# Patient Record
Sex: Female | Born: 1935 | Race: White | Hispanic: No | Smoking: Former smoker
Health system: Southern US, Community
[De-identification: ages and names within clinical notes are randomized; demographics above are authoritative.]

## PROBLEM LIST (undated history)

## (undated) DIAGNOSIS — J449 Chronic obstructive pulmonary disease, unspecified: Secondary | ICD-10-CM

## (undated) DIAGNOSIS — F419 Anxiety disorder, unspecified: Secondary | ICD-10-CM

## (undated) DIAGNOSIS — E119 Type 2 diabetes mellitus without complications: Secondary | ICD-10-CM

## (undated) DIAGNOSIS — E785 Hyperlipidemia, unspecified: Secondary | ICD-10-CM

## (undated) DIAGNOSIS — I10 Essential (primary) hypertension: Secondary | ICD-10-CM

## (undated) DIAGNOSIS — E039 Hypothyroidism, unspecified: Secondary | ICD-10-CM

## (undated) DIAGNOSIS — I890 Lymphedema, not elsewhere classified: Secondary | ICD-10-CM

## (undated) DIAGNOSIS — G4730 Sleep apnea, unspecified: Secondary | ICD-10-CM

## (undated) DIAGNOSIS — K219 Gastro-esophageal reflux disease without esophagitis: Secondary | ICD-10-CM

## (undated) DIAGNOSIS — M545 Low back pain, unspecified: Secondary | ICD-10-CM

## (undated) DIAGNOSIS — M1990 Unspecified osteoarthritis, unspecified site: Secondary | ICD-10-CM

## (undated) DIAGNOSIS — D649 Anemia, unspecified: Secondary | ICD-10-CM

## (undated) DIAGNOSIS — K449 Diaphragmatic hernia without obstruction or gangrene: Secondary | ICD-10-CM

## (undated) HISTORY — DX: Low back pain, unspecified: M54.50

## (undated) HISTORY — DX: Unspecified osteoarthritis, unspecified site: M19.90

## (undated) HISTORY — DX: Diaphragmatic hernia without obstruction or gangrene: K44.9

## (undated) HISTORY — DX: Sleep apnea, unspecified: G47.30

## (undated) HISTORY — DX: Type 2 diabetes mellitus without complications: E11.9

## (undated) HISTORY — DX: Hypothyroidism, unspecified: E03.9

## (undated) HISTORY — DX: Anemia, unspecified: D64.9

## (undated) HISTORY — DX: Chronic obstructive pulmonary disease, unspecified: J44.9

## (undated) HISTORY — DX: Anxiety disorder, unspecified: F41.9

## (undated) HISTORY — DX: Hyperlipidemia, unspecified: E78.5

## (undated) HISTORY — DX: Lymphedema, not elsewhere classified: I89.0

## (undated) HISTORY — PX: CLOSED REDUCTION SHOULDER DISLOCATION: SUR242

## (undated) HISTORY — DX: Essential (primary) hypertension: I10

## (undated) HISTORY — DX: Gastro-esophageal reflux disease without esophagitis: K21.9

---

## 2012-11-16 DIAGNOSIS — R32 Unspecified urinary incontinence: Secondary | ICD-10-CM

## 2012-11-16 DIAGNOSIS — H269 Unspecified cataract: Secondary | ICD-10-CM

## 2012-11-16 HISTORY — DX: Unspecified cataract: H26.9

## 2012-11-16 HISTORY — DX: Unspecified urinary incontinence: R32

## 2014-04-26 IMAGING — CT CT ANGIO CHEST
2 of 6 series · 18 of 36 positions shown · IV contrast (APPLIED)
Comparison: Chest radiograph [DATE], 8120 2385 hr

CLINICAL DATA: Tachycardia, COPD, elevated D-dimer. Assess for
pulmonary embolism.

EXAM:
CT ANGIOGRAPHY CHEST WITH CONTRAST
TECHNIQUE: Multidetector CT imaging of the chest was performed using the
standard protocol during bolus administration of intravenous
contrast. Multiplanar CT image reconstructions including MIPs were
obtained to evaluate the vascular anatomy.
CONTRAST:  75 cc of Isovue 370

[Series 5: pe 1.0 thins · axial · 0.68mm/px · z∈[-266,-40]mm · 17 of 255 slices shown]
[im 15/255  lung]
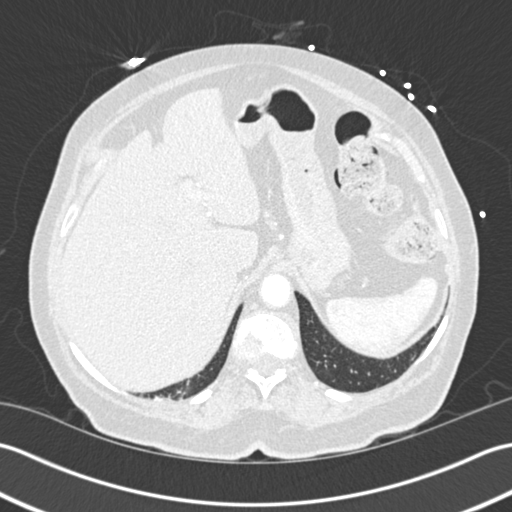
[im 29/255  mediastinal]
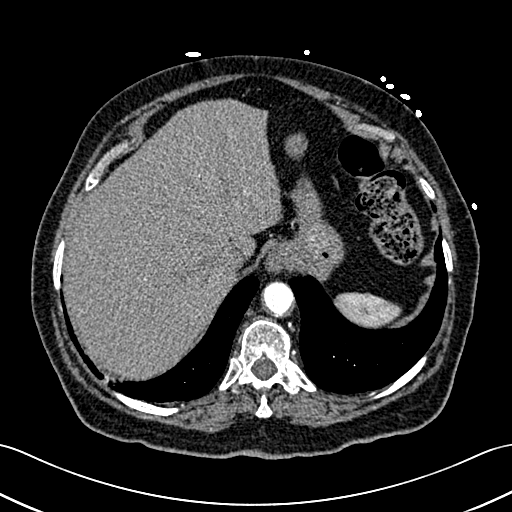
[im 43/255  lung]
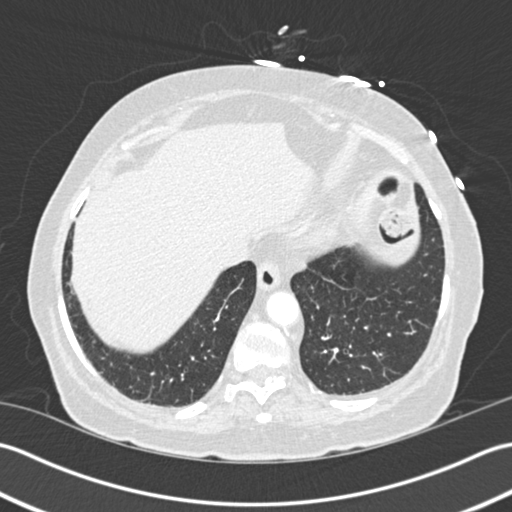
[im 57/255  mediastinal]
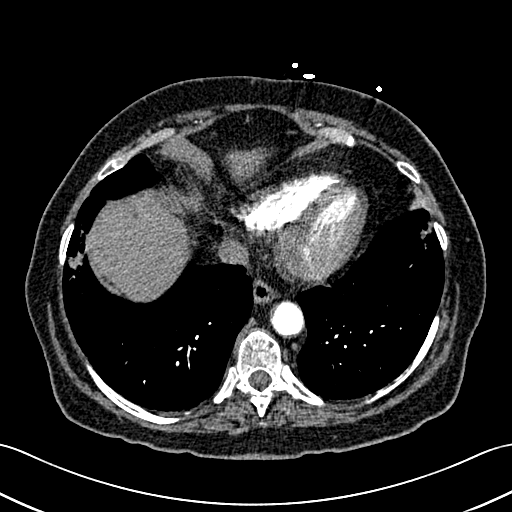
[im 71/255  lung]
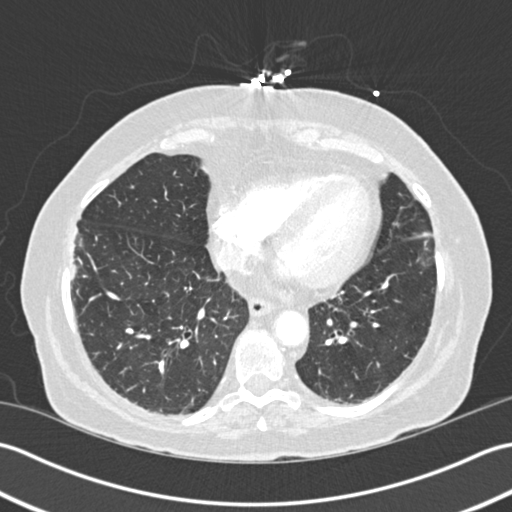
[im 85/255  mediastinal]
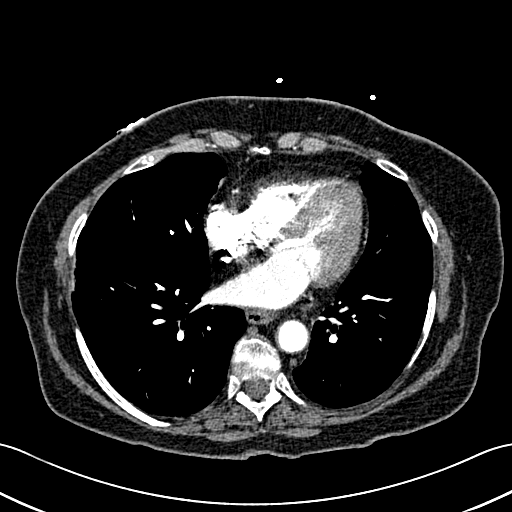
[im 99/255  lung]
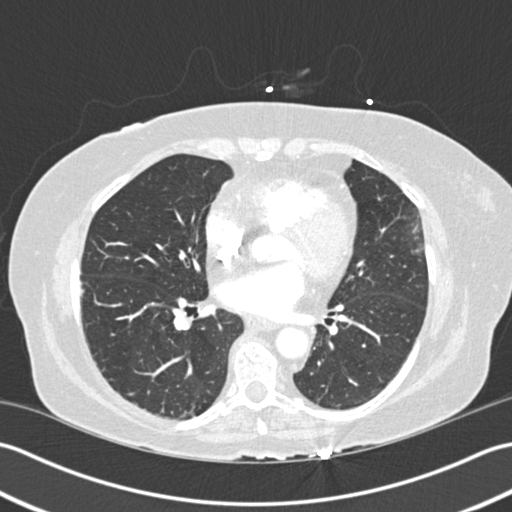
[im 113/255  mediastinal]
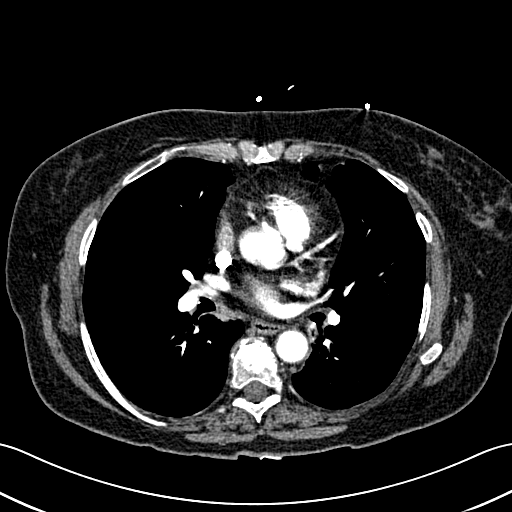
[im 128/255  lung]
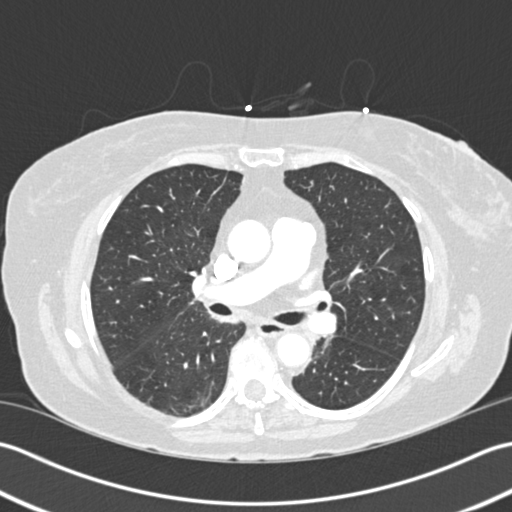
[im 142/255  mediastinal]
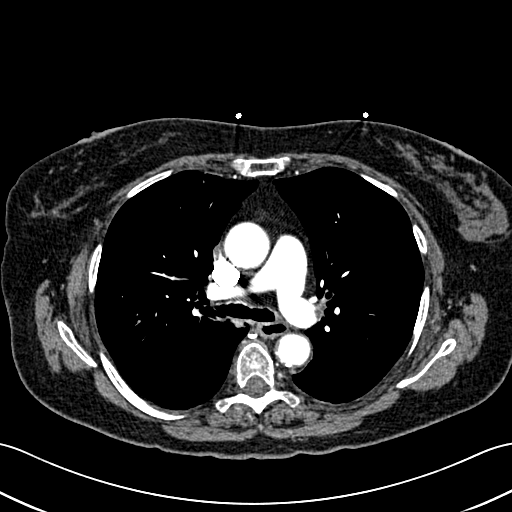
[im 156/255  lung]
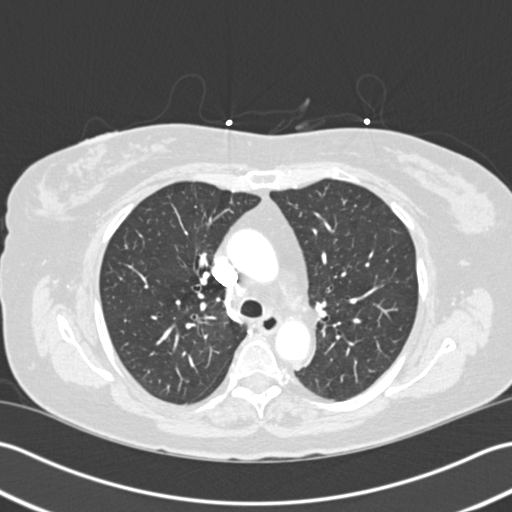
[im 170/255  mediastinal]
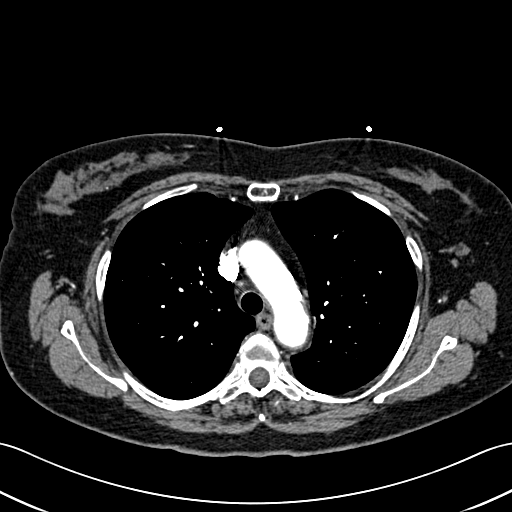
[im 184/255  lung]
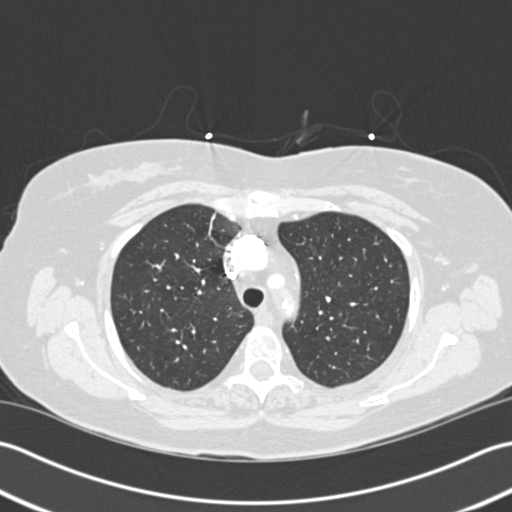
[im 198/255  mediastinal]
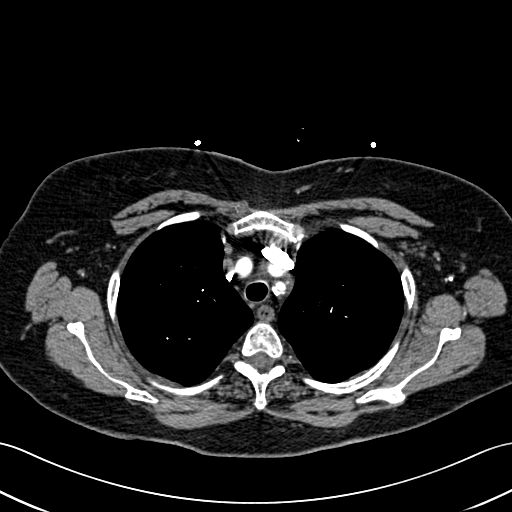
[im 212/255  lung]
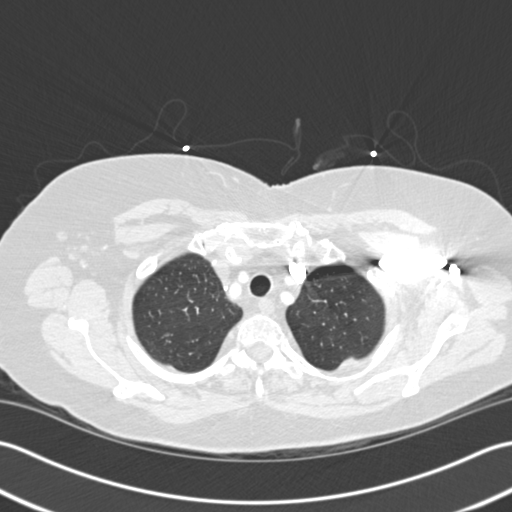
[im 226/255  mediastinal]
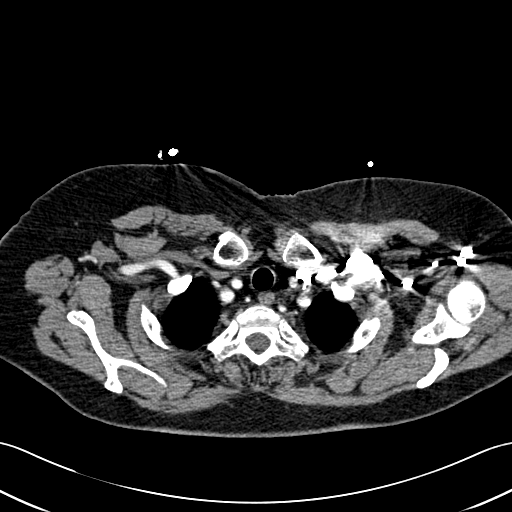
[im 240/255  lung]
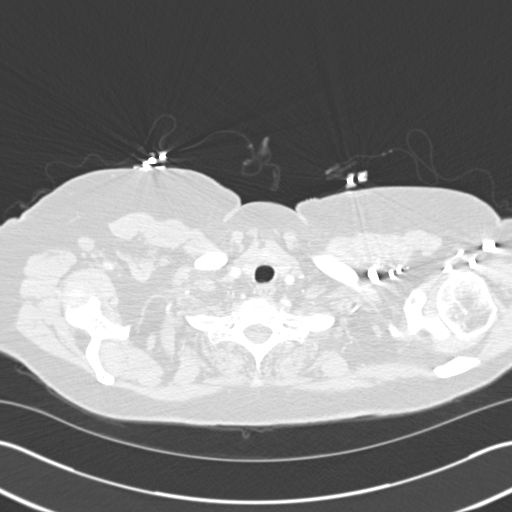

[Series 7: cor pe 2.0 mpr · coronal · 0.49mm/px · 1 of 128 slices shown]
[im 64/128  mediastinal]
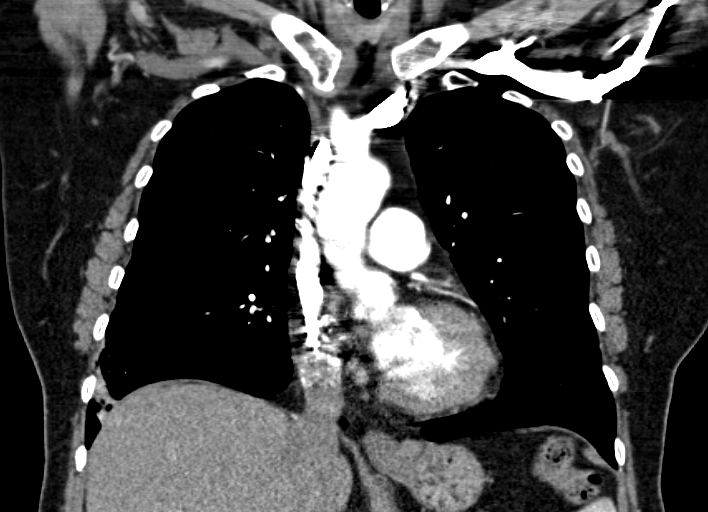

[18 of 36 positions shown; findings below may reference images not displayed]

FINDINGS: Technically adequate examination. Main pulmonary artery is not
enlarged. No pulmonary arterial filling defects to the level of the
subsegmental branches.

Heart size is normal. Pericardium is unremarkable. Mild calcific
atherosclerosis of the aortic arch, extending into the origin of the
left subclavian artery, the aorta is otherwise unremarkable.

Strandy densities in the lateral segments of the lower lobes
bilaterally likely reflect atelectasis. Patchy ground-glass
opacities in the lingula. Superimposed minimal consolidation the
lateral segment of the right lower lobe. No pleural effusions or
focal consolidations. Tracheobronchial tree is patent and midline.
Minimal atelectasis in the right middle lobe and lingula. No
pneumothorax.

Included view of the abdomen is unremarkable. Soft tissues are
nonsuspicious. Moderate degenerative change of the thoracic spine
without fracture nor malalignment.

Review of the MIP images confirms the above findings.
IMPRESSION: No pulmonary embolism.

Mild atelectasis, with minimal consolidation in the right middle
lobe, which is nonspecific though could reflect early pneumonia.
Patchy ground-glass is in the lingula are nonspecific.

  By: Ruben Victor Laos

## 2014-09-16 DIAGNOSIS — M353 Polymyalgia rheumatica: Secondary | ICD-10-CM

## 2014-09-16 HISTORY — DX: Polymyalgia rheumatica: M35.3

## 2014-11-16 DIAGNOSIS — I872 Venous insufficiency (chronic) (peripheral): Secondary | ICD-10-CM

## 2014-11-16 HISTORY — DX: Venous insufficiency (chronic) (peripheral): I87.2

## 2015-03-05 ENCOUNTER — Other Ambulatory Visit: Payer: Self-pay | Admitting: Podiatrist

## 2015-03-05 DIAGNOSIS — R601 Generalized edema: Secondary | ICD-10-CM

## 2015-03-14 ENCOUNTER — Ambulatory Visit
Admission: RE | Admit: 2015-03-14 | Discharge: 2015-03-14 | Disposition: A | Discharge status: Home / Self Care | Payer: Medicare PPO | Source: Ambulatory Visit | Attending: Podiatrist | Admitting: Podiatrist

## 2015-03-14 ENCOUNTER — Other Ambulatory Visit: Payer: Self-pay | Admitting: Podiatrist

## 2015-03-14 DIAGNOSIS — R601 Generalized edema: Secondary | ICD-10-CM

## 2015-03-14 DIAGNOSIS — I872 Venous insufficiency (chronic) (peripheral): Secondary | ICD-10-CM | POA: Insufficient documentation

## 2015-03-18 ENCOUNTER — Ambulatory Visit
Admission: RE | Admit: 2015-03-18 | Discharge: 2015-03-18 | Disposition: A | Discharge status: Home / Self Care | Payer: Medicare PPO | Source: Ambulatory Visit | Attending: Internal Medicine | Admitting: Internal Medicine

## 2015-03-18 ENCOUNTER — Other Ambulatory Visit: Payer: Self-pay | Admitting: Internal Medicine

## 2015-03-18 DIAGNOSIS — R079 Chest pain, unspecified: Secondary | ICD-10-CM | POA: Insufficient documentation

## 2015-03-18 DIAGNOSIS — R0602 Shortness of breath: Secondary | ICD-10-CM

## 2015-03-18 DIAGNOSIS — R5383 Other fatigue: Secondary | ICD-10-CM

## 2015-03-18 DIAGNOSIS — R918 Other nonspecific abnormal finding of lung field: Secondary | ICD-10-CM | POA: Insufficient documentation

## 2015-03-18 LAB — CBC AND DIFFERENTIAL
Basophils Absolute Automated: 0.09 10*3/uL (ref 0.00–0.20)
Basophils Automated: 1 %
Eosinophils Absolute Automated: 0.18 10*3/uL (ref 0.00–0.70)
Eosinophils Automated: 2 %
Hematocrit: 42.5 % (ref 37.0–47.0)
Hgb: 13.6 g/dL (ref 12.0–16.0)
Immature Granulocytes Absolute: 0.04 10*3/uL
Immature Granulocytes: 1 %
Lymphocytes Absolute Automated: 2.22 10*3/uL (ref 0.50–4.40)
Lymphocytes Automated: 27 %
MCH: 29.4 pg (ref 28.0–32.0)
MCHC: 32 g/dL (ref 32.0–36.0)
MCV: 91.8 fL (ref 80.0–100.0)
MPV: 9 fL — ABNORMAL LOW (ref 9.4–12.3)
Monocytes Absolute Automated: 0.58 10*3/uL (ref 0.00–1.20)
Monocytes: 7 %
Neutrophils Absolute: 5.26 10*3/uL (ref 1.80–8.10)
Neutrophils: 63 %
Platelets: 272 10*3/uL (ref 140–400)
RBC: 4.63 10*6/uL (ref 4.20–5.40)
RDW: 15 % (ref 12–15)
WBC: 8.33 10*3/uL (ref 3.50–10.80)

## 2015-03-18 LAB — COMPREHENSIVE METABOLIC PANEL
ALT: 12 U/L (ref 0–55)
AST (SGOT): 10 U/L (ref 5–34)
Albumin/Globulin Ratio: 1.6 (ref 0.9–2.2)
Albumin: 3.8 g/dL (ref 3.5–5.0)
Alkaline Phosphatase: 53 U/L (ref 37–106)
Anion Gap: 12 (ref 5.0–15.0)
BUN: 17.2 mg/dL (ref 7.0–19.0)
Bilirubin, Total: 0.4 mg/dL (ref 0.2–1.2)
CO2: 31 mEq/L — ABNORMAL HIGH (ref 22–29)
Calcium: 9.7 mg/dL (ref 7.9–10.2)
Chloride: 97 mEq/L — ABNORMAL LOW (ref 100–111)
Creatinine: 0.8 mg/dL (ref 0.6–1.0)
Globulin: 2.4 g/dL (ref 2.0–3.6)
Glucose: 105 mg/dL — ABNORMAL HIGH (ref 70–100)
Potassium: 3.8 mEq/L (ref 3.5–5.1)
Protein, Total: 6.2 g/dL (ref 6.0–8.3)
Sodium: 140 mEq/L (ref 136–145)

## 2015-03-18 LAB — TSH: TSH: 4.22 u[IU]/mL (ref 0.35–4.94)

## 2015-03-18 LAB — GFR: EGFR: >60

## 2015-03-18 LAB — CK: Creatine Kinase (CK): 19 U/L — ABNORMAL LOW (ref 29–168)

## 2015-03-18 LAB — B-TYPE NATRIURETIC PEPTIDE: B-Natriuretic Peptide: 70.8 pg/mL (ref 0.0–100.0)

## 2015-03-18 LAB — HEMOGLOBIN A1C: Hemoglobin A1C: 6.7 % — ABNORMAL HIGH (ref 0.0–6.0)

## 2015-03-18 MED ORDER — IODIXANOL 320 MG/ML IV SOLN
100.0000 mL | Freq: Once | INTRAVENOUS | Status: AC | PRN
Start: 2015-03-18 — End: 2015-03-18
  Administered 2015-03-18: 100 mL via INTRAVENOUS

## 2015-03-21 ENCOUNTER — Ambulatory Visit: Payer: Medicare PPO | Attending: Podiatrist | Admitting: Interventional Radiology and Diagnostic Radiology

## 2015-03-21 VITALS — BP 138/73 | HR 71 | Resp 16

## 2015-03-21 DIAGNOSIS — J449 Chronic obstructive pulmonary disease, unspecified: Secondary | ICD-10-CM | POA: Insufficient documentation

## 2015-03-21 DIAGNOSIS — M353 Polymyalgia rheumatica: Secondary | ICD-10-CM | POA: Insufficient documentation

## 2015-03-21 DIAGNOSIS — I10 Essential (primary) hypertension: Secondary | ICD-10-CM | POA: Insufficient documentation

## 2015-03-21 DIAGNOSIS — I872 Venous insufficiency (chronic) (peripheral): Secondary | ICD-10-CM | POA: Insufficient documentation

## 2015-03-24 NOTE — Progress Notes (Signed)
Interventional Radiology Consult   Date Time:  03/24/2015  7:17 PM                        Patient Name: Rachael [name],Rachael [name]  Referring Provider: Georgeanna Harrison, DPM                                   PMD: Jocelyn Lamer, DO      History of Present Illness:      Rachael [name]  is a 79 y.o. year old  female who has had right lower leg swelling since her 3rd pregnancy, approximately 40 years ago.  She also c/o neuropathy in left leg from childbirth injury.  She does c/o aching, throbbing and heaviness, particularly in her RLE, which are worse after standing for long periods and are relieved by rest and elevation.  She denies any previous episodes of superficial thrombophlebitis or skin ulceration. She has worn compression hose occasionally in the past, but states she did not wear them for long due to discomfort related to them.  She has never sought treatment for varicose veins previously.      Past Medical History:     Past Medical History   Diagnosis Date   . Hypertension    . Chronic obstructive pulmonary disease    . Sleep apnea    . Arthritis    . Polymyalgia rheumatica 09/2014   . 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 Incontinent of urine 2014     following back surgery       Past Surgical History:     Past Surgical History   Procedure Laterality Date   . Back surgery  2014   . Carpal tunnel release Bilateral 1978   . Hysterectomy  1985       Allergies:     Allergies   Allergen Reactions   . Tolectin [Tolmetin] Swelling   . Latex Itching                Contrast Allergy: none    Medications:     Current Outpatient Prescriptions   Medication Sig Dispense Refill   . Calcium Carbonate-Vit D-Min (CALCIUM 1200 PO) Take by mouth.     . diltiazem (CARDIZEM CD) 240 MG 24 hr capsule Take 240 mg by mouth daily.     . DULoxetine (CYMBALTA) 60 MG capsule Take 60 mg by mouth 2 (two) times daily.     Marland Kitchen gabapentin (NEURONTIN) 300 MG capsule Take 300 mg by mouth daily.     Marland Kitchen losartan-hydrochlorothiazide (HYZAAR)  100-25 MG per tablet Take 1 tablet by mouth daily.     . metoprolol (LOPRESSOR) 25 MG tablet Take 25 mg by mouth 2 (two) times daily.     . potassium chloride (MICRO-K) 10 MEQ CR capsule Take 10 mEq by mouth daily.     . predniSONE (DELTASONE) 5 MG tablet Take 7.5 mg by mouth daily.     . traMODol (ULTRAM-ER) 300 MG 24 hr tablet Take 300 mg by mouth daily.     . traZODone (DESYREL) 100 MG tablet Take 100 mg by mouth nightly.       No current facility-administered medications for this visit.       Review of Systems:   History obtained from patient.  A comprehensive review of systems was:   General: Negative for  fever or chills, recent wt loss  Eyes, ears, nose & throat: Negative for headaches, oral lesions, vertigo,  hearing loss or visual changes  Cardiovascular: Negative for chest pain, palpitations   Respiratory: Negative for cough, wheezing, shortness of breath, orthopnea, dyspnea on exertion,  or hemoptysis  Gastrointestinal: Negative for abdominal pain, appetite loss, blood in stools, change in bowel habits, diarrhea, nausea/vomiting or swallowing difficulty/pain   Genito-urinary: Negative for urinary frequency, urgency, dysuria or hematuria - does have incontinence following back surgery 2014  Neurological: Negative for seizure, weakness, paresthesia or functional changes  Musculoskeletal: negative for joint pain, joint swelling or muscular weakness - new diagnosis of polymyalgia rheumatica  Skin: Negative for rash, ulcers or  bruising    Physical Exam:     Filed Vitals:    03/21/15 1145   BP: 138/73   Pulse: 71   Resp: 16   SpO2: 94%        Constitutional: female who appears her stated age, in no apparent distress, well developed, well nourished.   Left lower extremity:  No obvious edema, wound, hyperpigmentation, The left leg demonstrates no bulging varicosities, scattered spider veins.  Otherwise normal coloration and turgor, intact with out ulcers/lesions/rash.   Left ankle circumference:  26cm    Left  calf circumference:   40cm   Left thigh circumference:  48cm  Left DP: 2+   Left PT: 2+     Right lower extremity: 1+ edema present at ankle to lower calf, no wound or hyperpigmentation. The right leg demonstrates no bulging varicosities, minimal scattered spider veins.  Otherwise normal coloration and turgor,  intact with out ulcers/lesions/rash.   Right ankle circumference:  28.5cm    Right calf circumference: 40cm   Right thigh circumference:  49cm  Right DP: 2+   Right PT: unable to palpate - present via doppler           Radiology:   Ct Angiogram Pulmonary With And Without Contrast    03/18/2015   History: Shortness of breath. Chest pain. Fatigue.  Technique:  CT angiographic images obtained through the chest following administration of 100 cc Visipaque 320 non-ionic IV contrast for the evaluation of pulmonary emboli.  CT angiographic images post processed with 3D MIP reconstructions. Nonionic contrast was used in this patient with acute chest symptoms.   Findings:  No filling defect is identified in the visualized pulmonary artery branches. Patchy areas of groundglass opacity identified within all lobes. No pulmonary nodularity. The heart is enlarged. No pleural or pericardial effusion. Central airways are patent. No lymphadenopathy. Esophagus is nondistended.       03/18/2015     1. No evidence of pulmonary embolism.  2. Patchy groundglass opacification within all lobes. Nonspecific appearance may represent inflammatory or infectious alveolitis.  Report was called and faxed to referring physician's office on the day of the examination.  Charlott Rakes, MD  03/18/2015 12:47 PM     US Venous Insufficiency Duplex Doppler Leg Right    03/14/2015   CLINICAL HISTORY: Right leg pain.  The right lower extremity venous system was evaluated using high resolution gray scale imaging, color Doppler, and spectral waveform analysis.  No deep or superficial thrombosis is identified.  The deep venous system was evaluated from the  posterior tibial veins through the common femoral veins.  Evaluation for reflux was performed in the standing position.  Examination of the deep system is normal without deep system reflux.  Examination of the superficial system shows severe  reflux of the right small saphenous vein at the mid calf level.  Maximum reflux time is 3 seconds.  Maximum diameter measures 4 mm.  The right great saphenous is normal. Severe reflux of the right great saphenous vein is noted at the right calf and ankle level however.  No incompetent perforators are seen.     03/14/2015    1.  Severe superficial venous insufficiency, right small saphenous vein at the mid calf level and right great saphenous vein at the calf and ankle level only. 2.  Normal deep system.  Lemar Livings, MD  03/14/2015 4:46 PM         Assessment:     Discussed with Dr. Sallee Lange   1. Severe and extensive reflux in the right great and small saphenous veins   - Discussed the etiology of superficial venous insufficiency with Rachael [name]  - Discussed conservative management of superficial venous insufficiency, including the use of compression stockings when she will be standing or sitting for long periods, as well as rest and elevation when the legs are symptomatic.   2.  Hypertension  3.  COPD - does not require supplemental O2  4.  Polymyalgia rheumatica      CEAP Classification: Clinical class CEAP number 2    Venous Clinical Severity Score (VCSS): Overall score total VCSS 3.  VCSS component scores: pain 0, varicose veins 0, venous edema 3,  pigmentation 0, inflammation 0, induration 0, number active ulcers 0,  duration of active ulcers 0, size of active ulcers 0, compression therapy 0.        Plan:   1.  USG sclerotherapy to right great saphenous vein from calf to ankle and right small saphenous vein in calf  2.  Knee-high compression garments will be adequate - must have prior to procedure - sent to [facility] for sizing/fitting      Thank you very much for  allowing [place] to participate in your patient's care.     Signed by: Horton Chin ACNP-BC  Interventional Radiology  [facility], [facility]  [phone] (office), [employer] ext [phone]

## 2015-04-02 ENCOUNTER — Ambulatory Visit: Payer: Medicare PPO | Attending: Nurse Practitioner

## 2015-04-02 DIAGNOSIS — R2689 Other abnormalities of gait and mobility: Secondary | ICD-10-CM | POA: Insufficient documentation

## 2015-04-02 DIAGNOSIS — I890 Lymphedema, not elsewhere classified: Secondary | ICD-10-CM | POA: Insufficient documentation

## 2015-04-02 DIAGNOSIS — I872 Venous insufficiency (chronic) (peripheral): Secondary | ICD-10-CM | POA: Insufficient documentation

## 2015-04-08 NOTE — PT Eval Note (Signed)
Visit 1 2 3 4 5 6    Right Limb Volume (ml) 7291 0 0 0 0 0   Left Limb Volume (ml) 7002 0 0 0 0 0   Right-Left (ml) 289.4        Right-Left (%) 4        Right: % Vol change         Left:  % Vol change              Visit 1             1    Affected Limb Limb       Segment          BILATERAL Length       Length (cm) Total # Right  Left       52      4 Segments Proximal Distal Proximal Distal    From data there are 13 full segments plus one partial segment of length = 0 13 4857 2434 4888 2113    Enter Circumferences in yellow cells below              Note that the first circumference pair to be entered            cm from for "0" cm corresponds to either the wrist or ankle   Limb Volumes Right Left  Edema %Edema   wrist or Circumferences (cm) segment     Total Volume (ml) 7291 7002 2161 NA NA   ankle Right Left number Volume (ml) Area (cm2) Limb only (ml) 7291 7002  1     0 28 26.9  Right Left Right Left  0 0   FALSE    4 29 25.6 1 259 219 114 105          8 32.6 28.5 2 302 233 124 109          12 35.7 32.3 3 372 295 138 123          16 39.1 37.3 4 446 386 151 142          20 40.8 39.5 5 508 470 160 154          24 42.1 40.5 6 547 510 166 160          28 40.5 39.1 7 543 505 166 159          32 40.2 39.1 8 519 487 161 156          36 43.9 43.7 9 563 546 170 168          40 47.5 47 10 665 655 185 183          44 49.7 50.7 11 752 760 195 198          48 53.5 55.7 12 848 902 209 217          52 56.6 58.2 13 965 1033 222 229  Therapist Shakaya Bhullar         Pt. Name Rachael [name]    Date 23-May-16      Copyright 2008,  BIMECO/BSRI  ID [id number]   Tx number 1

## 2015-04-08 NOTE — PT Eval Note (Signed)
[facility]  [address]  [place], [place]  [address]  Phone:  [phone]  Fax:  [phone]    PHYSICAL THERAPY EVALUATION AND PLAN OF CARE      *I agree to the plan of care stated below*                                                                                                                                           Physician Signature      Date  Horton Chin, NP      PATIENT: Rachael [name] DOB: [birth year]   MR #: [id number]  AGE: 79 y.o.    REFERRING MD:  Horton Chin, NP PRIMARY MD: Jocelyn Lamer, DO    CERTIFICATION DATES:   04/02/2015 through 07/01/2015 DIAGNOSES:  I89.0 Secondary Lymphedema, I87.2 Venous Insufficiency        Date of Service PT Received On: 04/02/15   Treatment Time Start Time: 1308 to Stop Time: 1440   Time Calculation Time Calculation (min): 92 min   Visit # PT Visit  PT Visit Number: 1   Units Billed PT Evaluation  $ PT Evaluation (97001): 1 Procedure           Past Medical/Surgical History:  Past Medical History   Diagnosis Date   . Hypertension    . Chronic obstructive pulmonary disease    . Sleep apnea    . Arthritis    . Polymyalgia rheumatica 09/2014   . 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 Incontinent of urine 2014     following back surgery     Past Surgical History   Procedure Laterality Date   . Back surgery  2014   . Carpal tunnel release Bilateral 1978   . Hysterectomy  1985        Diagnostic Imaging:    US Venous Insufficiency Duplex Doppler Leg Right  1. Severe superficial venous insufficiency, right small saphenous vein at the mid calf level and right great saphenous vein at the calf and  ankle level only.  2. Normal deep system.  Lemar Livings, MD    03/14/2015 4:46 PM    Allergies:  Allergies   Allergen Reactions   . Tolectin [Tolmetin] Swelling   . Latex Itching       Medications:  Cymbalta, trazodone, lorsartan, gabapentin, diltiazem, metoprolol, tramadol,  calcium with vitamin D, potassium.    SUBJECTIVE:    Patient presents for physical therapy today alone.     Reason for consultation: Rachael [name] is a 79 y.o. female presenting for evaluation on B LE lymphedema and measurement for compression garment fitting prior to scheduling USG sclerotherapy to R LE (for right great saphenous vein and right small saphenous vein).  Patient states that she has had edema in  her R LE since her pregnancy 40 years ago and it has never resolved.  She has not been doing anything to manage the edema.  She denies any wounds, infection or lymphorrhea.    Patient also reports that her walking is limited by back pain - she had difficulty walking across parking lot from [facility] to here today.    Patient was a smoker, she quit 25 years ago, then restarted for a year about 3 years ago.  She does not smoke now.    Patient Treatment Goals:  Better health    Home Environment:    Patient lives with her daughter and daughter's family.  She has stairs at home, and reports she is able to get around the house okay.      Prior level of function and current level of function:    Independent  Retired  Driving    Pain:    Moderate in lower back after walking across parking lot.  Improved with some rest.    OBJECTIVE:  General inspection/observation:    Patient ambulates into clinic independently without assistive device.  Gait Assessment:  Initial ambulation stiff and antalgic, but improves after being on feet for some time    Respiratory ROS: no cough, shortness of breath, or wheezing  Cardiovascular ROS: no chest pain or dyspnea on exertion  Musculoskeletal ROS: positive for - joint stiffness, muscle pain, pain in back and swelling in both legs ( R >L)   - cramping with walking: No    Skin and Nail Appearance:   Skin is intact, dry and with varicosities on R thigh.  B feet are the same temperature as her arms and legs.  Toe nails painted, otherwise unremarkable.    Edema:  R LE: Moderate firmly  pitting edema on right lower extremity moreso distal to the knee.  Patient has positive Stemmer's sign on R foot, and has toe edema.  Tissue with decreased mobility.   L UJ:WJXB  pitting edema at left foot, ankle and distal gaiter area with mildly positive Stemmer's sign at toes.  Tissue with a soft feel and no notable decrease in mobility.    Limb Volume:  Assessment completed - 4% enlargement noted on right lower extremity (7.3L) versus left (7.0L).  See separate limb volume report in medical record  for this treatment date.    Pulses:   Dorsalis Pedis:  strong palpable pulses bilaterally     Sensation:    Patient presents with intact  with light touch at bilateral LE.      Patient was able to localize during light touch testing.    Range of Motion:  WFL through out bilateral LE except difficulty with reaching for bilateral feet.  At endrange of flexion, patient can barely reach feet.  She also gets SOB with reaching for her feet.  Limited by discomfort/pain in lower back at/near surgical site.    Strength:   Grossly 4+/5 through out bilateral LE    Outcomes:  ABC: Score 60.6%  Score indicates RISK FOR FALLS     Treatment today:  Examination and evaluation as above.  Educated patient in basic anatomy and function of lymphatic system and possible causes of lymphedema.  Educated patient in physical therapy role, treatment options (including complete decongestive therapy) and goals, as well as risks/benefits of proposed treatments as well as that of doing nothing.     Patient also educated on that she will have to be able to reach her feet  or have someone come in and learn how to assist her to bandage at home, otherwise, she will need an adjustable garment before she starts her course of treatment that she can use on the weekends during her intensive phase of treatment, and then transition to long term use of this garmemnt.  .  Patient did  demonstrate understanding of this education.    Patient would like to  consider her options, and only pursue lymphedema treatment once she returns from a family vacation that they have been planning for some time.      ASSESSMENT:   Rachael [name] is a 79 y.o. female with moderate secondary lymphedema on R LE due to venous insufficiency.  She also has mild lymphedema on her L LE that does not need CDT, but would benefit from the use of a compression garment.  Patients main limitation at this time is she will not be able to bandage herself, so will need a garment like a Farrow Strong knee high that she can transition to for the weekend and then as soon as intensive phase of bandaging is done.  Patient also with concerns about balance, and although she has not fallen recently, her ABC scores indicate that she is at a high risk of falls.  Patient will also benefit from some balance training.  Patient has been instructed that she will need an assistive device for safety while her R LE is bandaged (due to decreased proprioception).  Patients impairments include pain, edema, impaired balance, impaired gait, inability to manage lymphedema.  The resultant limitations include risk for increasing edema, further fibrotic tissue change that can affect joint mobility, skin breakdown , infection , open wounds;  limited knowledge of lymphedema treament and precautions, skin care and infection prevention.  Recommended treatment for this is complete decongestive therapy (including manual lymph drainage, multi-layer compression bandaging, skin and nail care, and therapeutic exercise instruction), in order to decrease swelling in R LE, increase lymph drainage from congested areas (including B LE), reduce risk of infection (eg: cellulitis).  In the past, patient has not used and means of edema management.  Without intervention, patient may end up with increased edema, fibrotic tissue change that can affect joint mobility, infection , open wounds, or with falls and potential hospitalization.    Perceived  barriers to participation in treatment:   Patient is unable to reach feet for effective self management of bandages    Precautions for therapy:    Fall Risk  HBP / CAD    G Codes:    Function-Related G Codes  Function Related G Code: Self Care      Self Care G Code Set  Self Care, Current Status (V4259): At least 80 percent but less than 100 percent impaired, limited or restricted  Self Care, Goal Status (D6387): At least 1 percent but less than 20 percent impaired, limited or restricted  Tools used to determine level of impairment: other (comment) (lymphedema self management)     Short Term Goals:  2 weeks after treatment starts.  1. Patient will verbalize understanding of lymphedema risk reduction recommendations, early identification of edema exacerbation and cellulitis, and appropriate self care strategies.  2. Patient and/or caregiver(s) will demonstrate independent application of multi-layer compression bandaging to assist with reduction of limb volume initially.  3. Patient will demonstrate objective edema reduction via limb volumetry, measured weekly, with stabilization of edema reduction within 2-3 weeks to decrease risk of infection and wounds.   4. Patient will improve  score on the ABC by at least 100 points to decrease risk for falls and increase safety with functional mobility.    Long Term Goals: 8-10 weeks after treatment starts.  1. Patient will obtain well-fitting day and nighttime compression garments and demonstrate proper don/doff and care of garments to assist with control of lymphedema.  2. Patient will be able to maintain/improve achieved edema reduction with an interim edema management program while awaiting delivery of compression garments  3. Patient will be able to maintain optimal control of B LE lymphedema with well fitting day and nighttime compression garments and home edema management program by discharge.  4. Patient will improve score on the ABC by at least 200 points to decrease  risk for falls and increase safety with functional mobility.  5. Patient will be able to sweep the floor with at least 70% confidence that she will not lose her balance or become unsteady.    PLAN:  Complete Decongestive Therapy (manual lymph drainage, multi-layer compression bandaging, skin and nail care, and therapeutic exercise instruction) 5x/wk for 2 weeks, with up to 10 additional sessions over episode of care, anticipated at 8-10 weeks, to allow for compression garment fitting and instruction, modification of garments and assessment of effectiveness.   Therapeutic Activities, to include functional mobility training.  Neuromuscular Re-education, to include balance training.  Gait Training  Physical Tests and Measures for objective data collection to include Neurocom Testing.  Patient/Caregiver Education and Training      Therapist Signature:    Carvel Getting, PT, DPT, CLT    [place] # [id number]

## 2015-07-08 ENCOUNTER — Other Ambulatory Visit: Payer: Self-pay | Admitting: Interventional Radiology and Diagnostic Radiology

## 2015-08-02 ENCOUNTER — Emergency Department: Payer: Medicare PPO

## 2015-08-02 ENCOUNTER — Emergency Department
Admission: EM | Admit: 2015-08-02 | Discharge: 2015-08-03 | Disposition: A | Discharge status: Home / Self Care | Payer: Medicare PPO | Attending: Emergency Medicine | Admitting: Emergency Medicine

## 2015-08-02 DIAGNOSIS — G4730 Sleep apnea, unspecified: Secondary | ICD-10-CM | POA: Insufficient documentation

## 2015-08-02 DIAGNOSIS — Z79899 Other long term (current) drug therapy: Secondary | ICD-10-CM | POA: Insufficient documentation

## 2015-08-02 DIAGNOSIS — R0602 Shortness of breath: Secondary | ICD-10-CM | POA: Insufficient documentation

## 2015-08-02 DIAGNOSIS — J441 Chronic obstructive pulmonary disease with (acute) exacerbation: Secondary | ICD-10-CM | POA: Insufficient documentation

## 2015-08-02 DIAGNOSIS — I10 Essential (primary) hypertension: Secondary | ICD-10-CM | POA: Insufficient documentation

## 2015-08-02 DIAGNOSIS — Z87891 Personal history of nicotine dependence: Secondary | ICD-10-CM | POA: Insufficient documentation

## 2015-08-02 LAB — URINALYSIS, REFLEX TO MICROSCOPIC EXAM IF INDICATED
Bilirubin, UA: NEGATIVE
Blood, UA: NEGATIVE
Glucose, UA: NEGATIVE
Ketones UA: NEGATIVE
Leukocyte Esterase, UA: NEGATIVE
Nitrite, UA: NEGATIVE
Protein, UR: NEGATIVE
Specific Gravity UA: 1.012 (ref 1.001–1.035)
Urine pH: 7 (ref 5.0–8.0)
Urobilinogen, UA: NORMAL mg/dL

## 2015-08-02 LAB — CBC AND DIFFERENTIAL
Basophils Absolute Automated: 0.04 10*3/uL (ref 0.00–0.20)
Basophils Automated: 0 %
Eosinophils Absolute Automated: 0.19 10*3/uL (ref 0.00–0.70)
Eosinophils Automated: 2 %
Hematocrit: 39.1 % (ref 37.0–47.0)
Hgb: 12.4 g/dL (ref 12.0–16.0)
Immature Granulocytes Absolute: 0.03 10*3/uL
Immature Granulocytes: 0 %
Lymphocytes Absolute Automated: 1.54 10*3/uL (ref 0.50–4.40)
Lymphocytes Automated: 14 %
MCH: 29.1 pg (ref 28.0–32.0)
MCHC: 31.7 g/dL — ABNORMAL LOW (ref 32.0–36.0)
MCV: 91.8 fL (ref 80.0–100.0)
MPV: 8.7 fL — ABNORMAL LOW (ref 9.4–12.3)
Monocytes Absolute Automated: 0.57 10*3/uL (ref 0.00–1.20)
Monocytes: 5 %
Neutrophils Absolute: 8.28 10*3/uL — ABNORMAL HIGH (ref 1.80–8.10)
Neutrophils: 78 %
Platelets: 282 10*3/uL (ref 140–400)
RBC: 4.26 10*6/uL (ref 4.20–5.40)
RDW: 14 % (ref 12–15)
WBC: 10.62 10*3/uL (ref 3.50–10.80)

## 2015-08-02 LAB — COMPREHENSIVE METABOLIC PANEL
ALT: 9 U/L (ref 0–55)
AST (SGOT): 10 U/L (ref 5–34)
Albumin/Globulin Ratio: 1.4 (ref 0.9–2.2)
Albumin: 3.8 g/dL (ref 3.5–5.0)
Alkaline Phosphatase: 60 U/L (ref 37–106)
Anion Gap: 10 (ref 5.0–15.0)
BUN: 11 mg/dL (ref 7.0–19.0)
Bilirubin, Total: 0.3 mg/dL (ref 0.2–1.2)
CO2: 28 mEq/L (ref 22–29)
Calcium: 9.6 mg/dL (ref 7.9–10.2)
Chloride: 104 mEq/L (ref 100–111)
Creatinine: 0.8 mg/dL (ref 0.6–1.0)
Globulin: 2.8 g/dL (ref 2.0–3.6)
Glucose: 133 mg/dL — ABNORMAL HIGH (ref 70–100)
Potassium: 3.7 mEq/L (ref 3.5–5.1)
Protein, Total: 6.6 g/dL (ref 6.0–8.3)
Sodium: 142 mEq/L (ref 136–145)

## 2015-08-02 LAB — TROPONIN I: Troponin I: <0.01 ng/mL (ref 0.00–0.09)

## 2015-08-02 LAB — LIPASE: Lipase: 16 U/L (ref 8–78)

## 2015-08-02 LAB — B-TYPE NATRIURETIC PEPTIDE: B-Natriuretic Peptide: 141.4 pg/mL — ABNORMAL HIGH (ref 0.0–100.0)

## 2015-08-02 LAB — IHS D-DIMER: D-Dimer: 0.49 ug/mL FEU (ref 0.00–0.51)

## 2015-08-02 LAB — GFR: EGFR: >60

## 2015-08-02 MED ORDER — ALBUTEROL SULFATE (2.5 MG/3ML) 0.083% IN NEBU
5.0000 mg | INHALATION_SOLUTION | Freq: Once | RESPIRATORY_TRACT | Status: AC
Start: 2015-08-02 — End: 2015-08-03
  Administered 2015-08-03: 5 mg via RESPIRATORY_TRACT
  Filled 2015-08-02: qty 6

## 2015-08-02 MED ORDER — IPRATROPIUM BROMIDE 0.02 % IN SOLN
1.0000 mg | Freq: Once | RESPIRATORY_TRACT | Status: AC
Start: 2015-08-02 — End: 2015-08-03
  Administered 2015-08-03: 1 mg via RESPIRATORY_TRACT
  Filled 2015-08-02: qty 5

## 2015-08-02 MED ORDER — METHYLPREDNISOLONE SODIUM SUCC 125 MG IJ SOLR
125.0000 mg | Freq: Once | INTRAMUSCULAR | Status: DC
Start: 2015-08-02 — End: 2015-08-03

## 2015-08-02 NOTE — ED Provider Notes (Signed)
Physician/Midlevel provider first contact with patient: 08/02/15 2026         History     Chief Complaint   Patient presents with   . Shortness of Breath     The history is provided by the patient and a relative.   Timing: upon waking this am  SOB  Associated with cough. Chronic RLE now with new LLE over the past few weeks  Modifying: no personal or family h/o DVT/PE, no travel. Former smoker, PMD COPD on inhalers (steroid, long acting b agonist)  Severity: moderate  Pertinent negatives: no associated fever, CP, back pain  Alleviating: none    Past Medical History   Diagnosis Date   . Hypertension    . Chronic obstructive pulmonary disease    . Sleep apnea    . Arthritis    . Polymyalgia rheumatica 09/2014   . 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 Incontinent of urine 2014     following back surgery       Past Surgical History   Procedure Laterality Date   . Back surgery  2014   . Carpal tunnel release Bilateral 1978   . Hysterectomy  1985       History reviewed. No pertinent family history.    Social  Social History   Substance Use Topics   . Smoking status: Former Smoker -- 1.00 packs/day for 25 years     Quit date: 11/16/1998   . Smokeless tobacco: None   . Alcohol Use: 1.8 oz/week     3 Glasses of wine per week      Comment: social       .     Allergies   Allergen Reactions   . Tolectin [Tolmetin] Swelling   . Latex Itching       Home Medications     Last Medication Reconciliation Action:  In Progress Ryals, Windy Fast, RN 08/02/2015  8:16 PM                  Calcium Carbonate-Vit D-Min (CALCIUM 1200 PO)     Take by mouth.     diltiazem (CARDIZEM CD) 240 MG 24 hr capsule     Take 240 mg by mouth daily.     DULoxetine (CYMBALTA) 60 MG capsule     Take 60 mg by mouth 2 (two) times daily.     gabapentin (NEURONTIN) 300 MG capsule     Take 300 mg by mouth daily.     losartan-hydrochlorothiazide (HYZAAR) 100-25 MG per tablet     Take 1 tablet by mouth daily.     metoprolol (LOPRESSOR) 25 MG tablet      Take 25 mg by mouth 2 (two) times daily.     potassium chloride (MICRO-K) 10 MEQ CR capsule     Take 10 mEq by mouth daily.     predniSONE (DELTASONE) 5 MG tablet     Take 7.5 mg by mouth daily.     traMODol (ULTRAM-ER) 300 MG 24 hr tablet     Take 300 mg by mouth daily.     traZODone (DESYREL) 100 MG tablet     Take 100 mg by mouth nightly.           Review of Systems   Constitutional: Negative for fever.   HENT: Negative for congestion and sore throat.    Eyes: Negative for redness.   Respiratory: Positive for cough and shortness  of breath.    Cardiovascular: Positive for leg swelling. Negative for chest pain.   Gastrointestinal: Negative for vomiting, abdominal pain and diarrhea.   Genitourinary: Negative for dysuria and hematuria.   Musculoskeletal: Negative for back pain.   Skin: Negative for rash.   Neurological: Negative for syncope and headaches.   Hematological: Does not bruise/bleed easily.       Physical Exam    BP: 160/75 mmHg, Heart Rate: 94, Temp: 97.8 F (36.6 C), Resp Rate: 18, SpO2: 92 %, Weight: 81.647 kg    Physical Exam   Constitutional: She is oriented to person, place, and time. She appears well-developed. No distress.   HENT:   Nose: Nose normal.   Mouth/Throat: Oropharynx is clear and moist.   B TM WNL  Pharynx WNL   Eyes: Conjunctivae are normal. Pupils are equal, round, and reactive to light.   Neck: Neck supple.   Cardiovascular: Normal rate and regular rhythm.    No murmur heard.  2+ radial and pedal pulses equal bilaterally   Pulmonary/Chest: Breath sounds normal. She has no wheezes. She has no rales.   Mild tachypnea, purse lip breathing   Abdominal: Soft. She exhibits distension (chronic unchanged per pt). There is no tenderness. There is no rebound and no guarding.   Musculoskeletal: She exhibits no edema (BLE) or tenderness.   Lymphadenopathy:     She has no cervical adenopathy.   Neurological: She is alert and oriented to person, place, and time.   Gross motor intact     Skin:  Skin is warm. No rash noted.   Psychiatric: She has a normal mood and affect. Thought content normal.   Nursing note and vitals reviewed.        MDM and ED Course     ED Medication Orders     None             MDM  Number of Diagnoses or Management Options  SOB (shortness of breath):   Diagnosis management comments: IRoderic Palau MD, have been the primary provider for Rachael [name] during this Emergency Dept visit.    Continuous pulse oximetry  O2 sat: 92% RA  Impression: WNL   Intervention: neb    EKG Interpretation:    Rhythm:  Normal Sinus  Ectopy:  None  Rate:  Normal  Conduction:  No blocks  ST Segments:  No acute ST segment changes  T Waves:  No acute T Wave changes  Axis:  Normal    Clinical Impression:  Normal EKG    Dx COPD exacerbation. Rx prednisone. ATC q4 x24 then PRN. Close PMD f/u with RTER precautions. Not hypoxic. Well appearing, non-toxic. D/w pt and family results, care plan, return instructions; pt demonstrates understanding and agrees with plan. At time of ED eval, doubt ACS, CHF, PNA, respiratory failure.          Amount and/or Complexity of Data Reviewed  Clinical lab tests: ordered and reviewed  Tests in the radiology section of CPT: ordered and reviewed              Procedures    Clinical Impression & Disposition     Clinical Impression  Final diagnoses:   SOB (shortness of breath)        ED Disposition     Discharge Rachael [name] discharge to home/self care.    Condition at disposition: Stable             Discharge Medication List as of  08/03/2015 12:06 AM      START taking these medications    Details   albuterol (PROVENTIL) (2.5 MG/3ML) 0.083% nebulizer solution Take 3 mLs (2.5 mg total) by nebulization every 4 (four) hours as needed for Wheezing or Shortness of Breath (coughing)., Starting 08/03/2015, Until Mon 09/02/15, Print                         Maverick Dieudonne, Mordecai Rasmussen, MD  08/04/15 [phone]

## 2015-08-02 NOTE — ED Notes (Signed)
Pt in sono, unable to give meds at this time

## 2015-08-03 LAB — ECG 12-LEAD
Atrial Rate: 92 {beats}/min
P Axis: 53 degrees
P-R Interval: 140 ms
Q-T Interval: 354 ms
QRS Duration: 62 ms
QTC Calculation (Bezet): 437 ms
R Axis: 48 degrees
T Axis: 71 degrees
Ventricular Rate: 92 {beats}/min

## 2015-08-03 MED ORDER — ALBUTEROL SULFATE (2.5 MG/3ML) 0.083% IN NEBU
2.5000 mg | INHALATION_SOLUTION | RESPIRATORY_TRACT | Status: AC | PRN
Start: 2015-08-03 — End: 2015-09-02

## 2015-08-03 MED ORDER — PREDNISONE 20 MG PO TABS
60.0000 mg | ORAL_TABLET | Freq: Every day | ORAL | Status: AC
Start: 2015-08-03 — End: 2015-08-07

## 2015-08-03 MED ORDER — PREDNISONE 20 MG PO TABS
60.0000 mg | ORAL_TABLET | Freq: Once | ORAL | Status: AC
Start: 2015-08-03 — End: 2015-08-03
  Administered 2015-08-03: 60 mg via ORAL
  Filled 2015-08-03: qty 3

## 2015-08-03 NOTE — Discharge Instructions (Signed)
Return to ER immediately if worse in any way or for any other concern especially if you need a breathing treatment more than every 3 hours.     Use albuterol (2 nebules at once) every 4 hours around the clock for the next 24 hours then as needed.     COPD    You have been seen for an exacerbation (worsening) of your COPD.    COPD stands for "Chronic Obstructive Pulmonary Disease." COPD includes several diseases. These include chronic bronchitis and emphysema. They also include asthma and chronic bronchiectasis. It is progressive (gets worse). It is also irreversible (cannot be cured). Patients usually notice shortness of breath, especially with activity. They also often have problems inhaling or exhaling completely. They may also cough all the time. COPD is worse with air pollution of any kind. It is especially worse with exposure to smoke.    Emergency treatment for COPD exacerbations (worsening) includes albuterol (Ventolin/Proventil). It also includes ipratropium bromide (Atrovent) nebulization (inhalation) and steroids.    The decision to treat you for COPD without hospitalization depends on several factors. Some risk factors can make it hard to treat your condition. These include diabetes, cancer and alcoholism. They also include any chronic (ongoing) condition. Close follow-up with your doctor is required.    Do the following to prevent or decrease future episodes. The following will also help improve this episode of COPD exacerbation:   If you are a smoker STOP! Look for a program through your doctor to help you with this goal. If you live with a smoker, talk about the terrible effects of tobacco smoke on your health. Insist that smokers do not smoke in the house. Also insist that they not smoke in the car or anywhere near you. Keep smokers away from your oxygen. ANY SMOKE OR FLAME NEAR AN OXYGEN SOURCE MAY CAUSE FIRE AND EXPLOSIONS!   Do not use harsh chemicals for cleaning.   Stay away from  anyone who has a respiratory tract infection. Avoid crowds during the cold and flu season. Wash hands often. Infections are spread by hand-to-hand contact.   Get influenza (flu) vaccines every year. Ask your doctor about a pneumonia vaccination (Pneumovax).   Practice pursed-lip breathing. Breathe in through your nose. Then, slowly exhale through pursed lips. This is like blowing out a candle. While sitting at a table, lean forward at the waist onto your elbows. This position stretches out the chest. It makes it easier to breathe.    You may be getting home oxygen therapy. If so, do not increase the amount of oxygen used unless your doctor says to.    Continue your previous medicines unless told not to. Take all medicines prescribed today as directed.    YOU SHOULD SEEK MEDICAL ATTENTION IMMEDIATELY, EITHER HERE OR AT THE NEAREST EMERGENCY DEPARTMENT, IF ANY OF THE FOLLOWING OCCURS:   The material coughed up is a different color, is thicker, or gets bloody.   Coughing up green or yellow mucus.   Trouble breathing or wheezing.   Fever (temperature higher than 100.64F / 38C) or any fever that doesn t go away.   Chest pain.   Can t keep medicine down or vomiting, dizziness, weakness or confusion.   No improvement in 48 to 72 hours or symptoms get worse.   Unable to complete everyday activities, or just feel worse.

## 2015-08-04 NOTE — Progress Notes (Signed)
Quick Note:    ucx > 100,000 normal urogenital microbiota; No further work  ______

## 2015-08-12 ENCOUNTER — Ambulatory Visit: Payer: Medicare PPO | Attending: Family Medicine

## 2015-08-12 DIAGNOSIS — I872 Venous insufficiency (chronic) (peripheral): Secondary | ICD-10-CM | POA: Insufficient documentation

## 2015-08-12 DIAGNOSIS — I890 Lymphedema, not elsewhere classified: Secondary | ICD-10-CM | POA: Insufficient documentation

## 2015-08-12 NOTE — PT Eval Note (Signed)
Visit 1 2 3 4 5 6    Right Limb Volume (ml) 7291 7333 0 0 0 0   Left Limb Volume (ml) 7002 7108 0 0 0 0   Right-Left (ml) 289.4 226       Right-Left (%) 4 3       Right: % Vol change  0.6       Left:  % Vol change  1.5           Visit 1 2 3 4 5 6    Right Proximal (ml) 4857 4790 0 0 0 0   Right Distal (ml) 2434 2544 0 0 0 0   Left Proximal (ml) 4888 4933 0 0 0 0   Left Distal (ml) 2113 2175 0 0 0 0   Right: Total 7291 7333 0 0 0 0   Left:  Total 7002 7108 0 0 0 0        Visit 2            1    Affected Limb Limb       Segment          BILATERAL Length       Length (cm) Total # Right  Left       52      4 Segments Proximal Distal Proximal Distal    From data there are 13 full segments plus one partial segment of length = 0 13 4790 2544 4933 2175    Enter Circumferences in yellow cells below (columns C and D)        cm Note that the first circumference pair to be entered         cm from for "0" cm corresponds to either the wrist or ankle Limb Volumes Right Left  Edema %Edema   wrist or Circumferences (cm) segment    Total Volume (ml) 7333 7108 2171 NA NA   ankle Right Left number Volume (ml) Area (cm2) Limb only (ml) 7333 7108  1     0 29.1 26.9  Right Left Right Left  0 0       4 29.1 26.7 1 270 229 116 107          8 33.4 29.8 2 311 254 127 114          12 36.6 33.5 3 390 319 141 128          16 40.5 37.4 4 474 401 156 143          20 41.7 39.4 5 538 470 165 154          24 42.2 40 6 560 502 168 159          28 40.5 38.1 7 545 486 166 157          32 40.4 39.6 8 521 481 162 156          36 43.3 44 9 558 557 169 170          40 45.5 47.5 10 628 667 178 185          44 50 51.9 11 727 787 194 202          48 53.7 55.5 12 857 919 210 217          52 55.8 58.6 13 955 1037 220 230  Therapist Benito Lemmerman         Pt. Name Rachael [name]   Date 26-Sep-16      Copyright 2004,  [employer] ID [id number]   Tx number

## 2015-08-12 NOTE — PT Eval Note (Signed)
[facility]  [address] [place] [address]  Phone: [phone]      Fax: [phone]    PHYSICAL THERAPY EVALUATION AND PLAN OF CARE      *I agree to the below stated plan of care*                  Physician Signature      Date  REFERRED BY: Maebelle Munroe, DO      PATIENT: Rachael [name] DOB: 05-22-1936   MR #: [id number]  AGE: 79 y.o.    FACILITY PROVIDER #: [id number] PRIMARY MD: Jocelyn Lamer, DO    HICN# Medicare Sub. Num: [id number] DIAGNOSES: Lymphedema, not elsewhere classified [I89.0]    CERTIFICATION PERIOD: 08/12/2015 to 11/09/2015       Date of Service PT Received On: 09/26/16PT Received On: 08/12/15    Treatment Time Start Time: 0930 to Stop Time: 1102  to     Time Calculation Time Calculation (min): 92 minStart Time: 0930 to Stop Time: 1102    Visit # PT Visit  PT Visit Number: 1PT Visit  PT Visit Number: 1    Units Billed PT Evaluation  $ PT Evaluation (97001): 1 Procedure Therapeutic Interventions  $ PT Lymphedema Drng (01027): 2 Units         START OF CARE:  Initial evaluation on 04/02/2015  TREATMENT DIAGNOSIS: I87.2 Venous Insufficiency    Past Medical/Surgical History:  Past Medical History   Diagnosis Date   . Hypertension    . Chronic obstructive pulmonary disease    . Sleep apnea    . Arthritis    . Polymyalgia rheumatica 09/2014   . 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 Incontinent of urine 2014     following back surgery     Past Surgical History   Procedure Laterality Date   . Back surgery  2014   . Carpal tunnel release Bilateral 1978   . Hysterectomy  1985         Medications:  has a current medication list which includes the following prescription(s): albuterol, calcium carbonate-vit d-min, diltiazem, duloxetine, gabapentin, losartan-hydrochlorothiazide, metoprolol, potassium chloride, tramodol, and trazodone.    Precautions:   Fall Risk  HBP / CAD  Allergies   Allergen Reactions   . Tolectin  [Tolmetin] Swelling   . Latex Itching       Summary of Care: Rachael [name] is a 79 y.o. female with moderate secondary lymphedema on R LE due to venous insufficiency and mild lymphedema on her L LE.  She was initially evaluated on 04/02/2015, but was not ready to start compression earlier this year - but she is now.    She did not express concerns about balance today, but will be further assessed at next visit for balance and safety in standing.     SUBJECTIVE REPORT:  Patient reports she was not ready to start compression earlier this year - but she is now.  She still feels she will not be able to bandage her feet although she can reach them with some ease.    Patient Goal:  To reduce edema and get compression garments that she will actually use.    Pain:    Patient denies pain today.    OBJECTIVE:  Respiratory ROS: no cough, shortness of breath, or wheezing   - SpO2: 93% on RA  Cardiovascular ROS: no chest  pain or dyspnea on exertion   - HR: 83bpm   - BP: 148/75    General inspection/observation:    Patient ambulates into clinic independently without assistive device.  Gait Assessment:  Abnormal, Decreased stride length and Decreased toe clearance bilaterally.  no assistive device  Body habitus - obese  Type of footwear worn today: crocs    CLINICAL FINDINGS:  ITEM: INITIAL EVAL CURRENT   Skin and Nail Appearance Skin is intact, dry and with varicosities on R thigh. B feet are the same temperature as her arms and legs. Toe nails painted, otherwise unremarkable. Skin is intact and is well hydrated.  She has varicosities on her R thigh. B feet are the same temperature as her arms and legs.    Wounds   none   none   Edema   R LE: Moderate firmly pitting edema on right lower extremity moreso distal to the knee. Patient has positive Stemmer's sign on R foot, and has toe edema. Tissue with decreased mobility.   L ZO:XWRU pitting edema at left foot, ankle and distal gaiter area with mildly positive Stemmer's  sign at toes. Tissue with a soft feel and no notable decrease in mobility. R LE: Mildly firm moderately pitting edema on right lower extremity moreso distal to the knee. Positive Stemmer's sign on R foot, and toe edema present. Tissue with decreased mobility.     L LE: Mild pitting edema at left foot, ankle and distal gaiter area.  Mildly positive Stemmer's sign. Tissue with a soft feel and no notable decrease in mobility.   Limb Volume   4% enlargement noted on right lower extremity (7.3L) versus left (7.0L).      Assessment completed - 3% enlargement noted on right lower extremity (7.3L) versus left (7.1L).  See separate limb volume report in medical record  for this treatment date.   Garments   None   None yet - starting compression bandaging today.   Edema Management   none Started compression bandaging today       OUTCOMES:  ITEM: INITIAL EVAL CURRENT    ABC  60.6% Not assessed     G codes:    Function-Related G Codes  Function Related G Code: Self Care      Self Care G Code Set  Self Care, Current Status (E4540): At least 80 percent but less than 100 percent impaired, limited or restricted  Self Care, Goal Status (J8119): At least 1 percent but less than 20 percent impaired, limited or restricted  Tools used to determine level of impairment:  (lymphedema self management)       TREATMENT TODAY:  Examination and evaluation as above. Re-educated patient in possible causes of lymphedema, physical therapy role, treatment options (including complete decongestive therapy) and goals, as well as risks/benefits of proposed treatments as well as that of doing nothing.   Discussed option of getting Tamala Bari leggings and Farrow Hybrid versus self bandaging over the first weekend and then getting Farrow 4000 leggings.  Patient demonstrates ability to reach her feet with ease, but still feels she will not be able to self-bandage her legs this weekend.    Will try self bandaging tomorrow, if it seems that  patient will not be successful, I will assist her to order the [facility] garments.  Patient did demonstrate understanding of this education.   Patient would like to pursue lymphedema treatment as recommended by this PT.    SKIN CARE: Cleansed skin with hospital no-rinse bath  wash, moisturized skin.  COMPRESSION BANDAGING: Bandaged R LE from MTPs to knees with stockinette, 10cm Rosidal Soft and then Rosidal K short stretch bandage (6cm, 8cm and 10cm).  Bandaged L LE from MTPs to knees with stockinette, 10cm Rosidal Soft and then Rosidal K short stretch bandage (6cm and 8cm).  Capillary refill in <3seconds.  Instructed patient to keep bandages on until tomorrow, they may be removed only if patient has discomfort that is not resolved by readjustment or bandages get soiled.      ASSESSMENT:  Rachael [name] is a 79 y.o. female  with moderate secondary lymphedema on R LE due to venous insufficiency and mild lymphedema on her L LE.  She was initially evaluated on 04/02/2015, but was not ready to start compression earlier this year - but she is now.    She did not express concerns about balance today, but will be further assessed at next visit for balance and safety in standing.   Patient concerned that she will not be able to bandage her feet and legs effectively -  we will try self bandaging tomorrow, if it seems that patient will not be successful, I will assist her to order the [facility] garments.      Patients impairments include edema, inability to manage lymphedema. The resultant limitations include risk for increasing edema, further fibrotic tissue change that can affect joint mobility, skin breakdown , infection , open wounds; limited knowledge of lymphedema treament and precautions, skin care and infection prevention.   Recommended treatment for this is complete decongestive therapy (including manual lymph drainage, multi-layer compression bandaging, skin and nail care, and therapeutic exercise instruction), in  order to decrease swelling in R LE, increase lymph drainage from congested areas (including B LE), reduce risk of infection (eg: cellulitis). As yet, patient has not used any means of edema management.  Without intervention, patient may end up with increased edema, fibrotic tissue change that can affect joint mobility, infection , open wounds, or with falls and potential hospitalization.    Perceived barriers to participation in treatment:   Patient is unable to reach feet for effective self management of bandages    Rehabilitation Potential:  Good for goals.    Short Term Goals: to be met by 08/26/2015.  1. Patient will verbalize understanding of lymphedema risk reduction recommendations, early identification of edema exacerbation and cellulitis, and appropriate self care strategies.  2. Patient and/or caregiver(s) will demonstrate independent application of multi-layer compression bandaging to assist with reduction of limb volume initially.  3. Patient will demonstrate objective edema reduction via limb volumetry, measured weekly, with stabilization of edema reduction within 2-3 weeks to decrease risk of infection and wounds.   4. Patient will improve score on the ABC by at least 100 points to decrease risk for falls and increase safety with functional mobility.    Long Term Goals: to be met by 11/09/2015.  1. Patient will obtain well-fitting day and nighttime compression garments and demonstrate proper don/doff and care of garments to assist with control of lymphedema.  2. Patient will be able to maintain/improve achieved edema reduction with an interim edema management program while awaiting delivery of compression garments  3. Patient will be able to maintain optimal control of B LE lymphedema with well fitting day and nighttime compression garments and home edema management program by discharge.  4. Patient will improve score on the ABC by at least 200 points to decrease risk for falls and increase safety  with functional mobility.  5. Patient will be able to sweep the floor with at least 70% confidence that she will not lose her balance or become unsteady.    Patient will require skilled intervention to address above stated functional limitations and prevent increased edema, fibrotic tissue change that can affect joint mobility, and open wounds.    PLAN:  Complete Decongestive Therapy (manual lymph drainage, multi-layer compression bandaging, skin and nail care, and therapeutic exercise instruction) 5x/wk for 2 weeks, followed by 1-2x/week for lymphedema management and balance training.  The episode of care anticipated at 10-12 weeks, to allow for compression garment fitting and instruction, modification of garments and assessment of effectiveness as well as balance training.   Therapeutic Activities, to include functional mobility training.  Neuromuscular Re-education, to include balance training.  Gait Training  Physical Tests and Measures for objective data collection to include Neurocom Testing.  Patient/Caregiver Education and Training    It has been a pleasure to work with [employer].  Please contact me with any questions or concerns regarding your patients care.  Thank you,     Carvel Getting, PT, DPT, CLT    [place] # [id number]  08/12/2015

## 2015-08-13 ENCOUNTER — Ambulatory Visit: Payer: Medicare PPO

## 2015-08-13 DIAGNOSIS — I872 Venous insufficiency (chronic) (peripheral): Secondary | ICD-10-CM

## 2015-08-13 DIAGNOSIS — I890 Lymphedema, not elsewhere classified: Secondary | ICD-10-CM

## 2015-08-13 NOTE — Progress Notes (Signed)
[facility]  [address], [address]  [place], [place]  [address]  Phone:  [phone]  Fax:  [phone]    Physical Therapy Daily Note    PATIENT: Rachael [name] DOB: 1936-03-28   MR #: [id number]  AGE: 79 y.o.    REFERRING MD:  Maebelle Munroe, DO PRIMARY MD: Jocelyn Lamer, DO    CERTIFICATION DATES:   Certification Period  Start Date of Certification Period: 08/12/15  End Date of Certification Period: 10/10/15 DIAGNOSES:    Lymphedema, not elsewhere classified [I89.0]  I87.2 Venous Insufficiency        Date of Service PT Received On: 08/13/15   Treatment Time Start Time: 0805 to Stop Time: 0857   Time Calculation Time Calculation (min): 52 min   Visit # PT Visit  PT Visit Number: 2   Units Billed   Therapeutic Interventions  $ PT Lymphedema Drng (46962): 3 Units         Medications:  Patient/Caregiver does not report changes to medication at this time.    SUBJECTIVE:     Patient presents for physical therapy today alone.   Patient reports that bandages stayed on through the night.       Pain:    Patient denies pain today.    OBJECTIVE:  General inspection/observation:    Patient mobility today: arrives walking independently without device.  Arrives with bandages intact, and skin intact upon removal.  Skin Temperature - normal.  Notable decrease in limb  In B legs, however, R foot still swollen.    Treatment Performed Today:   SKIN CARE: Cleansed skin with hospital no-rinse bath wash, moisturized skin.  COMPRESSION BANDAGING: Bandaged R LE from MTPs to knees with stockinette, 10cm Rosidal Soft and then Rosidal K short stretch bandage (6cm, 8cm and 10cm). Bandaged L LE from MTPs to knees with stockinette, 10cm Rosidal Soft and then Rosidal K short stretch bandage (6cm and 8cm). Capillary refill in <3seconds. Instructed patient to keep bandages on until tomorrow, they may be removed only if patient has discomfort that is not resolved by  readjustment or bandages get soiled.  Trained patient in self bandaging on L LE. Patient applied Rosidal Soft foam and 6cm Rosidal K bandage.      Education/Home Exercise Program:    Patient was educated on:  Proper bandaging sequence and technique for safe and effective compression.  Lymphedema risk reduction recommendations, early identification of edema exacerbation and cellulitis, and appropriate self care strategies  Importance of good nutrition, exercise and weight control    Patient did  demonstrate understanding of this education.      No Home Exercise Program issued today.    ASSESSMENT:  Problems - B LE lymphedema (R more involved than L)  Progress - patient able to donn compression bandages with heavy CGA for technique. Expect that by the end of the week, she will be able to bandage B LE independently.     Goals (copied from last note):  Short Term Goals: to be met by 08/26/2015.  1. Patient will verbalize understanding of lymphedema risk reduction recommendations, early identification of edema exacerbation and cellulitis, and appropriate self care strategies.  2. Patient and/or caregiver(s) will demonstrate independent application of multi-layer compression bandaging to assist with reduction of limb volume initially.  3. Patient will demonstrate objective edema reduction via limb volumetry, measured weekly, with stabilization of edema reduction within 2-3 weeks to decrease risk of infection and  wounds.   4. Patient will improve score on the ABC by at least 100 points to decrease risk for falls and increase safety with functional mobility.    Long Term Goals: to be met by 11/09/2015.  1. Patient will obtain well-fitting day and nighttime compression garments and demonstrate proper don/doff and care of garments to assist with control of lymphedema.  2. Patient will be able to maintain/improve achieved edema reduction with an interim edema management program while awaiting delivery of compression  garments  3. Patient will be able to maintain optimal control of B LE lymphedema with well fitting day and nighttime compression garments and home edema management program by discharge.  4. Patient will improve score on the ABC by at least 200 points to decrease risk for falls and increase safety with functional mobility.  5. Patient will be able to sweep the floor with at least 70% confidence that she will not lose her balance or become unsteady.      Plan: Patient Requires Continued Skilled Care to focus on reducing the size of B LE.     Continue with established plan of care with focus on multi-layer compression bandaging, train patient on compression bandaging techniques, patient education in lymphedema self-management for next visit.    Therapist Signature:    Carvel Getting, PT, DPT, CLT    [place] # [phone]  08/13/2015

## 2015-08-14 ENCOUNTER — Ambulatory Visit: Payer: Medicare PPO

## 2015-08-14 DIAGNOSIS — I890 Lymphedema, not elsewhere classified: Secondary | ICD-10-CM

## 2015-08-14 DIAGNOSIS — I872 Venous insufficiency (chronic) (peripheral): Secondary | ICD-10-CM

## 2015-08-15 ENCOUNTER — Ambulatory Visit: Payer: Medicare PPO

## 2015-08-15 DIAGNOSIS — I872 Venous insufficiency (chronic) (peripheral): Secondary | ICD-10-CM

## 2015-08-15 DIAGNOSIS — I890 Lymphedema, not elsewhere classified: Secondary | ICD-10-CM

## 2015-08-15 NOTE — Progress Notes (Signed)
[facility]  [address]  [place], [place]  [address]  Phone:  [phone]  Fax:  [phone]    Physical Therapy Daily Note    PATIENT: Rachael [name] DOB: [birth year]   MR #: [id number]  AGE: 79 y.o.    REFERRING MD:  Jocelyn Lamer, DO PRIMARY MD: Jocelyn Lamer, DO    CERTIFICATION DATES:   Certification Period  Start Date of Certification Period: 08/12/15  End Date of Certification Period: 10/10/15 DIAGNOSES:    Lymphedema, not elsewhere classified [I89.0]  I87.2 Venous Insufficiency        Date of Service PT Received On: 08/14/15   Treatment Time Start Time: 0925 to Stop Time: 1000   Time Calculation Time Calculation (min): 35 min   Visit # PT Visit  PT Visit Number: 3   Units Billed   Therapeutic Interventions  $ PT Lymphedema Drng (46962): 2 Units         Medications:  has a current medication list which includes the following prescription(s): albuterol, calcium carbonate-vit d-min, diltiazem, duloxetine, gabapentin, losartan-hydrochlorothiazide, metoprolol, potassium chloride, tramodol, and trazodone.  Patient/Caregiver does not report changes to medication at this time.    Precautions:        SUBJECTIVE:     Patient presents for physical therapy today alone.   Patient 25 min late for appointment - she was waiting in the wrong waiting area and had not checked in, thus could not be found.      Pain:    Mild    OBJECTIVE:  General inspection/observation:    Patient mobility today: arrives walking independently without device.  Arrives with bandages intact, and skin intact upon removal.  Skin Temperature - normal.    Treatment Performed Today:   SKIN CARE: Cleansed skin with hospital no-rinse bath wash, moisturized skin.  COMPRESSION BANDAGING: Bandaged R LE from MTPs to knees with stockinette, 10cm Rosidal Soft and then Rosidal K short stretch bandage (6cm, 8cm and 10cm). Bandaged L LE from MTPs to knees with stockinette,  10cm Rosidal Soft and then Rosidal K short stretch bandage (6cm and 8cm). Capillary refill in <3seconds. Instructed patient to keep bandages on until tomorrow, they may be removed only if patient has discomfort that is not resolved by readjustment or bandages get soiled.    Education/Home Exercise Program:  Patient was educated on:  Proper bandaging sequence and technique for safe and effective compression.  Wear and care of bandages.    Patient did  demonstrate understanding of this education.      No Home Exercise Program issued today.    ASSESSMENT:  Problems - B LE lymphedema (R more involved than L)  Progress - patient with limited time today as session started 25 min late.  Patient not able to practice bandaging today, but will do so tomorrow so that by the end of the week, she will be able to bandage B LE independently.       Goals (copied from last note):  Short Term Goals: to be met by 08/26/2015.  PROGRESSING - 1. Patient will verbalize understanding of lymphedema risk reduction recommendations, early identification of edema exacerbation and cellulitis, and appropriate self care strategies.  PROGRESSING - 2. Patient and/or caregiver(s) will demonstrate independent application of multi-layer compression bandaging to assist with reduction of limb volume initially.  PROGRESSING - 3. Patient will demonstrate objective edema reduction via limb volumetry, measured weekly, with stabilization of edema reduction within  2-3 weeks to decrease risk of infection and wounds.   NOT ADDRESSED TODAY - 4. Patient will improve score on the ABC by at least 100 points to decrease risk for falls and increase safety with functional mobility.    Long Term Goals: to be met by 11/09/2015.  NOT ADDRESSED TODAY - 1. Patient will obtain well-fitting day and nighttime compression garments and demonstrate proper don/doff and care of garments to assist with control of lymphedema.  NOT ADDRESSED TODAY - 2. Patient will be able to  maintain/improve achieved edema reduction with an interim edema management program while awaiting delivery of compression garments  NOT ADDRESSED TODAY - 3. Patient will be able to maintain optimal control of B LE lymphedema with well fitting day and nighttime compression garments and home edema management program by discharge.  NOT ADDRESSED TODAY - 4. Patient will improve score on the ABC by at least 200 points to decrease risk for falls and increase safety with functional mobility.  NOT ADDRESSED TODAY - 5. Patient will be able to sweep the floor with at least 70% confidence that she will not lose her balance or become unsteady.    Plan: Patient Requires Continued Skilled Care to focus on reducing the size of B LE.     Continue with established plan of care with focus on multi-layer compression bandaging, train patient on compression bandaging techniques, skin and nail care for next visit.    Therapist Signature:    Carvel Getting, PT, DPT, CLT    [place] # [phone]  08/15/2015

## 2015-08-16 ENCOUNTER — Ambulatory Visit: Payer: Medicare PPO

## 2015-08-16 DIAGNOSIS — I890 Lymphedema, not elsewhere classified: Secondary | ICD-10-CM

## 2015-08-16 DIAGNOSIS — I872 Venous insufficiency (chronic) (peripheral): Secondary | ICD-10-CM

## 2015-08-16 NOTE — Progress Notes (Addendum)
[facility]  [address], [address]  [place], [place]  [address]  Phone:  [phone]  Fax:  [phone]    Physical Therapy Daily Note    PATIENT: Rachael [name] DOB: [birth year]   MR #: [id number]  AGE: 79 y.o.    REFERRING MD:  Maebelle Munroe, DO PRIMARY MD: Jocelyn Lamer, DO    CERTIFICATION DATES:   Certification Period  Start Date of Certification Period: 08/12/15  End Date of Certification Period: 10/10/15 DIAGNOSES:    Lymphedema, not elsewhere classified [I89.0]  I87.2 Venous Insufficiency        Date of Service PT Received On: 08/16/15   Treatment Time Start Time: 0902 to Stop Time: 0959   Time Calculation Time Calculation (min): 57 min   Visit # PT Visit  PT Visit Number: 5   Units Billed PT Evaluation  $ PT Test Measurements w/Rep (97750): 1 unit Therapeutic Interventions  $ PT Lymphedema Drng (63875): 3 Units         Medications:  Patient/Caregiver does not report changes to medication at this time.    SUBJECTIVE:     Patient presents for physical therapy today alone.   Patient finding bandaging to be doable.      Pain:    Patient denies pain today.    OBJECTIVE:  General inspection/observation:    Patient mobility today: arrives walking independently without device.  Arrives with bandages intact.  Skin broken at nicks from shaving - but no longer bleeding today.   Skin Temperature - normal.    Treatment Performed Today:   LIMB VOLUMETRY: Measured limb volume.  B LE decreased overall, but R thigh increased - see note dated today in chart for details.  SKIN CARE: Cleansed skin with hospital no-rinse bath wash, moisturized skin.  COMPRESSION BANDAGING: Rebandaged as prior visit.  patient instructed in self bandaging.  She bandaged the R LE while this PT bandaged the L.      Education/Home Exercise Program:    Patient was educated on:  Proper bandaging sequence and technique for safe and effective compression.   Provided with written  instructions for bandaging.    Patient did  demonstrate understanding of this education.      No Home Exercise Program issued today.    ASSESSMENT:  Problems - B LE lymphedema (R more involved than L)  Progress - B LE edema improvement except in L thigh where volumes are increasing slightly.  Will monitor this.  Patient to bandage herself through the weekend.  Able to bandage R LE today with increased time and distant S.  Given written directions today as well.    Goals (copied from last note):  Short Term Goals: to be met by 08/26/2015.  PROGRESSING - 1. Patient will verbalize understanding of lymphedema risk reduction recommendations, early identification of edema exacerbation and cellulitis, and appropriate self care strategies.  PROGRESSING - 2. Patient and/or caregiver(s) will demonstrate independent application of multi-layer compression bandaging to assist with reduction of limb volume initially.  PROGRESSING - 3. Patient will demonstrate objective edema reduction via limb volumetry, measured weekly, with stabilization of edema reduction within 2-3 weeks to decrease risk of infection and wounds.   NOT ADDRESSED TODAY - 4. Patient will improve score on the ABC by at least 100 points to decrease risk for falls and increase safety with functional mobility.    Long Term Goals: to be met by 11/09/2015.  NOT ADDRESSED  TODAY - 1. Patient will obtain well-fitting day and nighttime compression garments and demonstrate proper don/doff and care of garments to assist with control of lymphedema.  NOT ADDRESSED TODAY - 2. Patient will be able to maintain/improve achieved edema reduction with an interim edema management program while awaiting delivery of compression garments  NOT ADDRESSED TODAY - 3. Patient will be able to maintain optimal control of B LE lymphedema with well fitting day and nighttime compression garments and home edema management program by discharge.  NOT ADDRESSED TODAY - 4. Patient will improve score  on the ABC by at least 200 points to decrease risk for falls and increase safety with functional mobility.  NOT ADDRESSED TODAY - 5. Patient will be able to sweep the floor with at least 70% confidence that she will not lose her balance or become unsteady.    Plan: Patient Requires Continued Skilled Care to focus on reducing the size of B LE.     Continue with established plan of care with focus on limb volumetry, multi-layer compression bandaging, measuring for garments for next visit.    Therapist Signature:    Carvel Getting, PT, DPT, CLT    [place] # [phone]  08/17/2015

## 2015-08-16 NOTE — Progress Notes (Addendum)
[facility]  [address], [address]  [place], [place]  [address]  Phone:  [phone]  Fax:  [phone]    Physical Therapy Daily Note    PATIENT: Rachael [name] DOB: 1936/03/23   MR #: [id number]  AGE: 79 y.o.    REFERRING MD:  Maebelle Munroe, DO PRIMARY MD: Jocelyn Lamer, DO    CERTIFICATION DATES:   Certification Period  Start Date of Certification Period: 08/12/15  End Date of Certification Period: 10/10/15 DIAGNOSES:    Lymphedema, not elsewhere classified [I89.0]  I87.2 Venous Insufficiency        Date of Service PT Received On: 08/15/15   Treatment Time Start Time: 0905 to Stop Time: 1000   Time Calculation Time Calculation (min): 55 min   Visit # PT Visit  PT Visit Number: 4   Units Billed   Therapeutic Interventions  $ PT Lymphedema Drng (84132): 4 Units         Medications:  Patient/Caregiver does not report changes to medication at this time.    Precautions:   Latex allergy    SUBJECTIVE:     Patient presents for physical therapy today alone.   Patient states that she is pleased her legs look smaller.  Patient states that her grandson accidentally took her clean bandages to school today and she will have to buy another set.    Pain:    Patient denies pain today.    OBJECTIVE:  General inspection/observation:    Patient mobility today: arrives walking independently without device, although cautious steps.  Arrives with bandages removed for bathing prior to coming to PT this morning.  Patient also shaved her legs, and now has multiple nicks on her legs (at least 3 per leg) with a little bleeding noted.  Skin Temperature - normal.    Treatment Performed Today:   SKIN CARE: Cleansed skin with hospital no-rinse bath wash, moisturized skin.  COMPRESSION BANDAGING: Bandaged R LE from MTPs to knees with stockinette, 10cm Rosidal Soft and then Rosidal K short stretch bandage (6cm, 8cm and 10cm). Bandaged L LE from MTPs to knees with  stockinette, 10cm Rosidal Soft and then Rosidal K short stretch bandage (6cm and 8cm). Capillary refill in <3seconds. Instructed patient to keep bandages on until tomorrow, they may be removed only if patient has discomfort that is not resolved by readjustment or bandages get soiled.Patient completed all bandaging to L LE with heavy verbal instruction and occasional tactile facilitation.    Education/Home Exercise Program:    Patient was educated on:  Skin care to prevent skin breakdown - washing, checking daily and moisturization.    Skin care: extra care if shaving to prevent cutting skin.  Discussed alternatives to shaving - and pro/ cons of each.  Proper bandaging sequence and technique for safe and effective compression.    Patient did  demonstrate understanding of this education.      No Home Exercise Program issued today.    ASSESSMENT:  Problems - B LE lymphedema (R more involved than L)  Progress - edema in B LE continues to respond well to compression.   Patient with fair grasp of bandaging today.  She needs increased time to complete but to do so successfully.      Goals (copied from last note):  Short Term Goals: to be met by 08/26/2015.  PROGRESSING - 1. Patient will verbalize understanding of lymphedema risk reduction recommendations, early identification of edema exacerbation  and cellulitis, and appropriate self care strategies.  PROGRESSING - 2. Patient and/or caregiver(s) will demonstrate independent application of multi-layer compression bandaging to assist with reduction of limb volume initially.  PROGRESSING - 3. Patient will demonstrate objective edema reduction via limb volumetry, measured weekly, with stabilization of edema reduction within 2-3 weeks to decrease risk of infection and wounds.   NOT ADDRESSED TODAY - 4. Patient will improve score on the ABC by at least 100 points to decrease risk for falls and increase safety with functional mobility.    Long Term Goals: to be met by  11/09/2015.  NOT ADDRESSED TODAY - 1. Patient will obtain well-fitting day and nighttime compression garments and demonstrate proper don/doff and care of garments to assist with control of lymphedema.  NOT ADDRESSED TODAY - 2. Patient will be able to maintain/improve achieved edema reduction with an interim edema management program while awaiting delivery of compression garments  NOT ADDRESSED TODAY - 3. Patient will be able to maintain optimal control of B LE lymphedema with well fitting day and nighttime compression garments and home edema management program by discharge.  NOT ADDRESSED TODAY - 4. Patient will improve score on the ABC by at least 200 points to decrease risk for falls and increase safety with functional mobility.  NOT ADDRESSED TODAY - 5. Patient will be able to sweep the floor with at least 70% confidence that she will not lose her balance or become unsteady.      Plan: Patient Requires Continued Skilled Care to focus on reducing the size of B LE .     Continue with established plan of care with focus on limb volumetry, multi-layer compression bandaging, train patient on compression bandaging techniques for next visit.    Therapist Signature:    Carvel Getting, PT, DPT, CLT    [place] # [id number]  08/16/2015

## 2015-08-16 NOTE — Progress Notes (Signed)
Visit 1 2 3 4 5 6    Right Proximal (ml) 4857 4790 5007 0 0 0   Right Distal (ml) 2434 2544 2219 0 0 0   Left Proximal (ml) 4888 4933 5021 0 0 0   Left Distal (ml) 2113 2175 2007 0 0 0   Right: Total 7291 7333 7225 0 0 0   Left:  Total 7002 7108 7028 0 0 0     Visit 1 2 3 4 5 6    Right Limb Volume (ml) 7291 7333 7225 0 0 0   Left Limb Volume (ml) 7002 7108 7028 0 0 0   Right-Left (ml) 289.4 226 197      Right-Left (%) 4 3 3       Right: % Vol change  0.6 -0.9      Left:  % Vol change  1.5 0.4           Visit 3             1    Affected Limb Limb       Segment          BILATERAL Length       Length (cm) Total # Right  Left       52      4 Segments Proximal Distal Proximal Distal    From data there are 13 full segments plus one partial segment of length = 0 13 5007 2219 5021 2007    Enter Circumferences in yellow cells below (columns C and D)           cm Note that the first circumference pair to be entered           cm from for "0" cm corresponds to either the wrist or ankle   Limb Volumes Right Left  Edema %Edema   wrist or Circumferences (cm) segment     Total Volume (ml) 7225 7028 2140 NA NA   ankle  Right Left number Volume (ml) Area (cm2) Limb only (ml) 7225 7028  1     0 28.4 26  Right Left Right Left  0 0       4 27.6 25.1 1 250 208 112 102          8 30.5 27.9 2 269 224 117 107          12 34.3 31.9 3 335 285 131 121          16 37.4 36 4 410 368 144 138          20 38.7 38.2 5 461 438 152 149          24 40.1 39.8 6 494 484 158 156          28 39.6 39 7 506 494 159 158          32 42.6 40.1 8 538 498 166 158          36 45.5 44.3 9 618 568 177 171          40 47.7 47.3 10 692 668 187 185          44 52.4 52 11 798 786 204 202          48 53.9 55.9 12 900 927 213 218          52 55.6 60.5 13 955 1079 220 237  Therapist Thresia Ramanathan         Pt. Name Rachael [name]    Date  30-Sep-16      Copyright 2004,  [employer] ID [id number]   Tx number

## 2015-08-16 NOTE — Progress Notes (Addendum)
How to Bandage    1. Wash legs.  2. Apply a generous amount of unscented lotion on legs.  3. Put on the white soft foam from your ankle to your knee - finishing just below the crease of the knee.  4. **on RIGHT leg, place white ridged foam piece over top of foot, then go to step 5.  5. Using the 6cm bandage, start bandaging from the base of toes. go around 2 times.  6. Work your way up your foot with a 50% overlap.  7. When you get to the heel, wrap in the following order. go around the HEEL, then the ANKLE, and then the SOLE (these should overlap each other).    8. Start to work your way up the leg in a spiral with 50% overlap.  9. When the 6cm bandage runs out, tape the end to the bandage under it.  10. Get the 8cm bandage.  Start at the ankle and bandage up towards the knee in a spiral with a 50% overlap.   11. When the 8cm bandage runs out, tape the end to the bandage under it.  12. Get the 10cm bandage.   Start at the ankle and bandage up towards the thigh with a 50% overlap.    13. When the 10cm bandage runs out, tape the end to the bandage under it.      NOTES:  ** The bandages should end below the crease of the knee and not go beyond the foam layer beneath.   ** make sure you are giving the bandages a pull for firm compression.

## 2015-08-19 ENCOUNTER — Ambulatory Visit: Payer: Medicare PPO | Attending: Family Medicine

## 2015-08-19 DIAGNOSIS — I872 Venous insufficiency (chronic) (peripheral): Secondary | ICD-10-CM

## 2015-08-19 DIAGNOSIS — I890 Lymphedema, not elsewhere classified: Secondary | ICD-10-CM | POA: Insufficient documentation

## 2015-08-19 NOTE — Progress Notes (Signed)
[facility]  [address]  [place], [place]  [address]  Phone:  [phone]  Fax:  [phone]    Physical Therapy Daily Note    PATIENT: Rachael [name] DOB: 1936-06-23   MR #: [id number]  AGE: 79 y.o.    REFERRING MD:  Maebelle Munroe, DO PRIMARY MD: Jocelyn Lamer, DO    CERTIFICATION DATES:   Certification Period  Start Date of Certification Period: 08/12/15  End Date of Certification Period: 10/10/15 DIAGNOSES:    Lymphedema, not elsewhere classified [I89.0]  I87.2 Venous Insufficiency        Date of Service PT Received On: 08/19/15   Treatment Time Start Time: 1012 to Stop Time: 1108   Time Calculation Time Calculation (min): 56 min   Visit # PT Visit  PT Visit Number: 6   Units Billed PT Evaluation  $ PT Test Measurements w/Rep (97750): 1 unit Therapeutic Interventions  $ PT Lymphedema Drng (84132): 3 Units         Medications:  Patient/Caregiver does not report changes to medication at this time.    SUBJECTIVE:     Patient presents for physical therapy today alone.   Patient reports bandaging was too challenging over the weekend and she instructed her daughter on how to bandage her on Sunday.    Pain:    Reports a feeling of tenderness on her R  Leg in area where bandages where a little too tight.    OBJECTIVE:  General inspection/observation:    Patient mobility today: arrives walking independently without device.  Arrives with bandages intact.  Skin intact upon removal, but R LE with red ring around mid calf where the bandages were too tight.  Skin Temperature - normal.    Treatment Performed Today:   LIMB VOLUMETRY: Measured limb volume.  Volumes decreased on both legs this morning - see note dated today in chart for details.  SKIN CARE: Cleansed skin with hospital no-rinse bath wash, moisturized skin.  COMPRESSION BANDAGING: Rebandaged as prior visit.  MEASURING FOR GARMENTS: Measured bilateral lower extremity for off the  shelf Farrow 4000's and Farrow Hybrid liners.  Patient will fit into a size SMALL REGULAR Farrow 4000s, and size SMALL STANDARD Farrow Hybrid liners.  Will order aforementioned garments from [employer].    Education/Home Exercise Program:    Patient was educated on:  Skin care to prevent skin breakdown - washing, checking daily and moisturization.  Proper bandaging sequence and technique for safe and effective compression.  Need for patients daughter to be trained before she bandages again.  Patient did  demonstrate understanding of this education.      No Home Exercise Program issued today.    ASSESSMENT:  Problems - B LE lymphedema (R more involved than L)  Progress - edema in B LE responding well to compression.  Limb volumes decreased bilaterally.  Compression garments ordered today.      Goals (copied from last note):  Short Term Goals: to be met by 08/26/2015.  PROGRESSING - 1. Patient will verbalize understanding of lymphedema risk reduction recommendations, early identification of edema exacerbation and cellulitis, and appropriate self care strategies.  PROGRESSING - 2. Patient and/or caregiver(s) will demonstrate independent application of multi-layer compression bandaging to assist with reduction of limb volume initially.  PROGRESSING - 3. Patient will demonstrate objective edema reduction via limb volumetry, measured weekly, with stabilization of edema reduction within 2-3 weeks to decrease risk  of infection and wounds.   NOT ADDRESSED TODAY - 4. Patient will improve score on the ABC by at least 100 points to decrease risk for falls and increase safety with functional mobility.    Long Term Goals: to be met by 11/09/2015.  NOT ADDRESSED TODAY - 1. Patient will obtain well-fitting day and nighttime compression garments and demonstrate proper don/doff and care of garments to assist with control of lymphedema.  NOT ADDRESSED TODAY - 2. Patient will be able to maintain/improve achieved edema  reduction with an interim edema management program while awaiting delivery of compression garments  NOT ADDRESSED TODAY - 3. Patient will be able to maintain optimal control of B LE lymphedema with well fitting day and nighttime compression garments and home edema management program by discharge.  NOT ADDRESSED TODAY - 4. Patient will improve score on the ABC by at least 200 points to decrease risk for falls and increase safety with functional mobility.  NOT ADDRESSED TODAY - 5. Patient will be able to sweep the floor with at least 70% confidence that she will not lose her balance or become unsteady.    Plan: Patient Requires Continued Skilled Care to focus on reducing the size of B LE.     Continue with established plan of care with focus on manual lymph drainage, multi-layer compression bandaging, patient education in lymphedema self-management for next visit.    Therapist Signature:    Carvel Getting, PT, DPT, CLT    [place] # [phone]  08/19/2015

## 2015-08-20 ENCOUNTER — Ambulatory Visit: Payer: Medicare PPO

## 2015-08-20 DIAGNOSIS — I872 Venous insufficiency (chronic) (peripheral): Secondary | ICD-10-CM

## 2015-08-20 DIAGNOSIS — I890 Lymphedema, not elsewhere classified: Secondary | ICD-10-CM

## 2015-08-20 NOTE — Progress Notes (Signed)
[facility]  [address]  [place], [place]  [address]  Phone:  [phone]  Fax:  [phone]    Physical Therapy Daily Note    PATIENT: Rachael [name] DOB: Mar 31, 1936   MR #: [id number]  AGE: 79 y.o.    REFERRING MD:  Maebelle Munroe, DO PRIMARY MD: Jocelyn Lamer, DO    CERTIFICATION DATES:   Certification Period  Start Date of Certification Period: 08/12/15  End Date of Certification Period: 10/10/15 DIAGNOSES:    Lymphedema, not elsewhere classified [I89.0]  I87.2 Venous Insufficiency        Date of Service PT Received On: 08/20/15   Treatment Time Start Time: 0805 to Stop Time: 0850   Time Calculation Time Calculation (min): 45 min   Visit # PT Visit  PT Visit Number: 7   Units Billed   Therapeutic Interventions  $ PT Lymphedema Drng (46962): 3 Units         Medications:  Patient/Caregiver does not report changes to medication at this time.    SUBJECTIVE:     Patient presents for physical therapy today alone.   Patient wondering how helpful MLD will be in edema management.    Pain:    Patient denies pain today.    OBJECTIVE:  General inspection/observation:    Patient mobility today: arrives walking independently without device.  Arrives with bandages removed about an hour before treatment for bathing, and skin intact upon removal.  Skin Temperature - normal    Treatment Performed Today:   SKIN CARE: Moisturized skin.  MANUAL LYMPH DRAINAGE: MLD to R LE using axillo-inguinal anastomoses.  COMPRESSION BANDAGING: Rebandaged as prior visit.      Education/Home Exercise Program:    Patient was educated on:  Skin care to prevent skin breakdown - washing, checking daily and moisturization.    Patient did  demonstrate understanding of this education.      No Home Exercise Program issued today.    ASSESSMENT:  Patient presents today with continued edema in bilateral lower extremities. She removed the bandages on her legs about an hour  prior to treatment, for the purposes of bathing. During this time, the R LE increased in size quite a bit.  Performed MLD to R LE - patient had 0.5-1.0cm decrease in girths of leg, but no change in foot or thigh.  Compression garments are on order. Patient reminded today to call [employer] to pay for garments so she can have them by the end of the week.      Goals (copied from last note):  Short Term Goals: to be met by 08/26/2015.  PROGRESSING - 1. Patient will verbalize understanding of lymphedema risk reduction recommendations, early identification of edema exacerbation and cellulitis, and appropriate self care strategies.  PROGRESSING - 2. Patient and/or caregiver(s) will demonstrate independent application of multi-layer compression bandaging to assist with reduction of limb volume initially.  MET - 3. Patient will demonstrate objective edema reduction via limb volumetry, measured weekly, with stabilization of edema reduction within 2-3 weeks to decrease risk of infection and wounds.   NOT ADDRESSED TODAY - 4. Patient will improve score on the ABC by at least 100 points to decrease risk for falls and increase safety with functional mobility.    Long Term Goals: to be met by 11/09/2015.  PROGRESSING - 1. Patient will obtain well-fitting day and nighttime compression garments and demonstrate proper don/doff and care of garments  to assist with control of lymphedema.  PROGRESSING - 2. Patient will be able to maintain/improve achieved edema reduction with an interim edema management program while awaiting delivery of compression garments  NOT ADDRESSED TODAY - 3. Patient will be able to maintain optimal control of B LE lymphedema with well fitting day and nighttime compression garments and home edema management program by discharge.  NOT ADDRESSED TODAY - 4. Patient will improve score on the ABC by at least 200 points to decrease risk for falls and increase safety with functional mobility.  NOT ADDRESSED  TODAY - 5. Patient will be able to sweep the floor with at least 70% confidence that she will not lose her balance or become unsteady.    Plan: Patient Requires Continued Skilled Care to focus on reducing the size of B LE.     Continue with established plan of care with focus on multi-layer compression bandaging, patient education in lymphedema self-management for next visit.    Therapist Signature:    Carvel Getting, PT, DPT, CLT    [place] # [phone]  08/20/2015

## 2015-08-21 ENCOUNTER — Ambulatory Visit: Payer: Medicare PPO

## 2015-08-21 DIAGNOSIS — I890 Lymphedema, not elsewhere classified: Secondary | ICD-10-CM

## 2015-08-21 DIAGNOSIS — I872 Venous insufficiency (chronic) (peripheral): Secondary | ICD-10-CM

## 2015-08-21 NOTE — Progress Notes (Signed)
[facility]  [address]  [place], [place]  [address]  Phone:  [phone]  Fax:  [phone]    Physical Therapy Daily Note    PATIENT: Rachael [name] DOB: July 19, 1936   MR #: [id number]  AGE: 79 y.o.    REFERRING MD:  Maebelle Munroe, DO PRIMARY MD: Jocelyn Lamer, DO    CERTIFICATION DATES:   Certification Period  Start Date of Certification Period: 08/12/15  End Date of Certification Period: 10/10/15 DIAGNOSES:    Lymphedema, not elsewhere classified [I89.0]  I87.2 Venous Insufficiency        Date of Service PT Received On: 08/21/15   Treatment Time Start Time: 1010 to Stop Time: 1058   Time Calculation Time Calculation (min): 48 min   Visit # PT Visit  PT Visit Number: 8   Units Billed   Therapeutic Interventions  $ PT Lymphedema Drng (84132): 3 Units         Medications:  Patient/Caregiver does not report changes to medication at this time.    SUBJECTIVE:     Patient presents for physical therapy today alone.   Patient stating her compression garments may arrive before treatment tomorrow.    Pain:    Patient does not report pain today.    OBJECTIVE:  General inspection/observation:    Patient mobility today: arrives walking independently without device.  Arrives with bandages intact, and skin intact upon removal.  Skin Temperature - normal.    Treatment Performed Today:   SKIN CARE: Cleansed skin with hospital no-rinse bath wash, moisturized skin.  COMPRESSION BANDAGING: Rebandaged as prior visit.    Education/Home Exercise Program:  Patient was educated on:  Lymphedema risk reduction recommendations, early identification of edema exacerbation and cellulitis, and appropriate self care strategies  Importance of good nutrition, exercise and weight control    Patient did  demonstrate understanding of this education.      No Home Exercise Program issued today.    ASSESSMENT:  Patient awaiting Farrow compression garments.  Edema  continues to be fairly well controlled on  LE, but toes still a little swollen.  Will continue to bandage until garments arrive.  Hopefully she will have them tomorrow.      Goals (copied from last note):  Short Term Goals: to be met by 08/26/2015.  PROGRESSING - 1. Patient will verbalize understanding of lymphedema risk reduction recommendations, early identification of edema exacerbation and cellulitis, and appropriate self care strategies.  PROGRESSING - 2. Patient and/or caregiver(s) will demonstrate independent application of multi-layer compression bandaging to assist with reduction of limb volume initially.  MET - 3. Patient will demonstrate objective edema reduction via limb volumetry, measured weekly, with stabilization of edema reduction within 2-3 weeks to decrease risk of infection and wounds.   NOT ADDRESSED TODAY - 4. Patient will improve score on the ABC by at least 100 points to decrease risk for falls and increase safety with functional mobility.    Long Term Goals: to be met by 11/09/2015.  PROGRESSING - 1. Patient will obtain well-fitting day and nighttime compression garments and demonstrate proper don/doff and care of garments to assist with control of lymphedema.  PROGRESSING - 2. Patient will be able to maintain/improve achieved edema reduction with an interim edema management program while awaiting delivery of compression garments  NOT ADDRESSED TODAY - 3. Patient will be able to maintain optimal control of B LE lymphedema with well fitting day and  nighttime compression garments and home edema management program by discharge.  NOT ADDRESSED TODAY - 4. Patient will improve score on the ABC by at least 200 points to decrease risk for falls and increase safety with functional mobility.  NOT ADDRESSED TODAY - 5. Patient will be able to sweep the floor with at least 70% confidence that she will not lose her balance or become unsteady.      Plan: Patient Requires Continued Skilled Care to focus  on self-management to ensure gains of initial reductive phase are maintained long term.     Continue with established plan of care with focus on multi-layer compression bandaging or  garment fitting if garments have arrived for next visit.    Therapist Signature:    Carvel Getting, PT, DPT, CLT    [place] # [phone]  08/21/2015

## 2015-08-22 ENCOUNTER — Ambulatory Visit: Payer: Medicare PPO

## 2015-08-22 DIAGNOSIS — I890 Lymphedema, not elsewhere classified: Secondary | ICD-10-CM

## 2015-08-22 DIAGNOSIS — I872 Venous insufficiency (chronic) (peripheral): Secondary | ICD-10-CM

## 2015-08-23 ENCOUNTER — Ambulatory Visit: Payer: Medicare PPO

## 2015-08-23 DIAGNOSIS — I872 Venous insufficiency (chronic) (peripheral): Secondary | ICD-10-CM

## 2015-08-23 DIAGNOSIS — I890 Lymphedema, not elsewhere classified: Secondary | ICD-10-CM

## 2015-08-23 NOTE — Progress Notes (Addendum)
[facility]  [address], [address]  [place], [place]  [address]  Phone:  [phone]  Fax:  [phone]    Physical Therapy Daily Note    PATIENT: Rachael [name] DOB: 1936-02-25   MR #: [id number]  AGE: 79 y.o.    REFERRING MD:  Maebelle Munroe, DO PRIMARY MD: Jocelyn Lamer, DO    CERTIFICATION DATES:   Certification Period  Start Date of Certification Period: 08/12/15  End Date of Certification Period: 10/10/15 DIAGNOSES:    Lymphedema, not elsewhere classified [I89.0]  I87.2 Venous Insufficiency        Date of Service PT Received On: 08/23/15   Treatment Time Start Time: 1005 to Stop Time: 1055   Time Calculation Time Calculation (min): 50 min   Visit # PT Visit  PT Visit Number: 10   Units Billed   Therapeutic Interventions  $ PT Lymphedema Drng (13244): 3 Units         Medications:  Patient/Caregiver does not report changes to medication at this time.    SUBJECTIVE:     Patient presents for physical therapy today alone.   Patient reports her garments have not arrived yet.    Pain:    Patient denies pain today.    OBJECTIVE:  General inspection/observation:    Patient mobility today: arrives walking independently without device.  Arrives with bandages intact, and skin intact upon removal. Skin Temperature - normal.    Treatment Performed Today:   SKIN CARE: Cleansed skin with hospital no-rinse bath wash, moisturized skin.  COMPRESSION BANDAGING: Rebandaged as prior visit  GARMENTS:  Followed up with [employer] regarding compression garments.  Rep, Duwayne Heck, reports the item was on back order from one source so they have now ordered from and alternative source.  She expects they should arrive by Monday.    Education/Home Exercise Program:    No Home Exercise Program issued today.    G codes:    Function-Related G Codes  Function Related G Code: Self Care      Self Care G Code Set  Self Care, Current Status (W1027): At least 40  percent but less than 60 percent impaired, limited or restricted  Self Care, Goal Status (O5366): At least 1 percent but less than 20 percent impaired, limited or restricted  Tools used to determine level of impairment:  (Lymphedema self management)     ASSESSMENT:  Problems - continued B LE edema ( R LE worse than L)   Progress - patient demonstrates ability to donn / doff Farrow Hybrid socks without assistance.  Patient continues to wait for compression garments.  Her edema is fairly well managed with current edena      Goals (copied from last note):  Short Term Goals: to be met by 08/26/2015.  PROGRESSING - 1. Patient will verbalize understanding of lymphedema risk reduction recommendations, early identification of edema exacerbation and cellulitis, and appropriate self care strategies.  PROGRESSING - 2. Patient and/or caregiver(s) will demonstrate independent application of multi-layer compression bandaging to assist with reduction of limb volume initially.  MET - 3. Patient will demonstrate objective edema reduction via limb volumetry, measured weekly, with stabilization of edema reduction within 2-3 weeks to decrease risk of infection and wounds.   NOT ADDRESSED TODAY - 4. Patient will improve score on the ABC by at least 100 points to decrease risk for falls and increase safety with functional mobility.    Long Term  Goals: to be met by 11/09/2015.  PROGRESSING - 1. Patient will obtain well-fitting day and nighttime compression garments and demonstrate proper don/doff and care of garments to assist with control of lymphedema.  PROGRESSING - 2. Patient will be able to maintain/improve achieved edema reduction with an interim edema management program while awaiting delivery of compression garments  NOT ADDRESSED TODAY - 3. Patient will be able to maintain optimal control of B LE lymphedema with well fitting day and nighttime compression garments and home edema management program by discharge.  NOT ADDRESSED  TODAY - 4. Patient will improve score on the ABC by at least 200 points to decrease risk for falls and increase safety with functional mobility.  NOT ADDRESSED TODAY - 5. Patient will be able to sweep the floor with at least 70% confidence that she will not lose her balance or become unsteady.    Plan: Patient Requires Continued Skilled Care to focus on self-management to ensure gains of initial reductive phase are maintained long term.     Continue with established plan of care with focus on limb volumetry, multi-layer compression bandaging, garment fitting for next visit.    Therapist Signature:    Carvel Getting, PT, DPT, CLT    [place] # [id number]  08/24/2015

## 2015-08-23 NOTE — Progress Notes (Signed)
[facility]  [address]  [place], [place]  [address]  Phone:  [phone]  Fax:  [phone]    Physical Therapy Daily Note    PATIENT: Rachael [name] DOB: 03-20-1936   MR #: [id number]  AGE: 79 y.o.    REFERRING MD:  Maebelle Munroe, DO PRIMARY MD: Jocelyn Lamer, DO    CERTIFICATION DATES:   Certification Period  Start Date of Certification Period: 08/12/15  End Date of Certification Period: 10/10/15 DIAGNOSES:    Lymphedema, not elsewhere classified [I89.0]  I87.2 Venous Insufficiency        Date of Service PT Received On: 08/22/15   Treatment Time Start Time: 1005 to Stop Time: 1055   Time Calculation Time Calculation (min): 50 min   Visit # PT Visit  PT Visit Number: 9   Units Billed   Therapeutic Interventions  $ PT Lymphedema Drng (46962): 3 Units         Medications:  Patient/Caregiver does not report changes to medication at this time.    SUBJECTIVE:     Patient presents for physical therapy today alone.   Patient states her garments have not arrived yet.  She is hopeful that they will be here by tomorrows treatment.    Pain:    Patient denies pain today.    OBJECTIVE:  General inspection/observation:    Patient mobility today: arrives walking independently without device.  Arrives with bandages intact, and skin intact upon removal.  Skin Temperature - normal.    Treatment Performed Today:   SKIN CARE: Cleansed skin with hospital no-rinse bath wash, moisturized skin.  COMPRESSION BANDAGING: Rebandaged as prior visit.      ASSESSMENT:  Problems - continued B LE edema ( R LE worse than L)    Progress - edema in legs appears to have stabilized.  She is just waiting for Fabian November 4000s to arrive.  She hopes that they will be here by tomorrow so she does not have to bandage through the weekend.    Goals (copied from last note):  Short Term Goals: to be met by 08/26/2015.  PROGRESSING - 1. Patient will verbalize understanding of  lymphedema risk reduction recommendations, early identification of edema exacerbation and cellulitis, and appropriate self care strategies.  PROGRESSING - 2. Patient and/or caregiver(s) will demonstrate independent application of multi-layer compression bandaging to assist with reduction of limb volume initially.  MET - 3. Patient will demonstrate objective edema reduction via limb volumetry, measured weekly, with stabilization of edema reduction within 2-3 weeks to decrease risk of infection and wounds.   NOT ADDRESSED TODAY - 4. Patient will improve score on the ABC by at least 100 points to decrease risk for falls and increase safety with functional mobility.    Long Term Goals: to be met by 11/09/2015.  PROGRESSING - 1. Patient will obtain well-fitting day and nighttime compression garments and demonstrate proper don/doff and care of garments to assist with control of lymphedema.  PROGRESSING - 2. Patient will be able to maintain/improve achieved edema reduction with an interim edema management program while awaiting delivery of compression garments  NOT ADDRESSED TODAY - 3. Patient will be able to maintain optimal control of B LE lymphedema with well fitting day and nighttime compression garments and home edema management program by discharge.  NOT ADDRESSED TODAY - 4. Patient will improve score on the ABC by at least 200 points to decrease risk for falls and  increase safety with functional mobility.  NOT ADDRESSED TODAY - 5. Patient will be able to sweep the floor with at least 70% confidence that she will not lose her balance or become unsteady.    Plan: Patient Requires Continued Skilled Care to focus on self-management to ensure gains of initial reductive phase are maintained long term.     Continue with established plan of care with focus on limb volumetry, garment fitting for next visit.    Therapist Signature:    Carvel Getting, PT, DPT, CLT    [place] # [phone]  08/23/2015

## 2015-08-26 ENCOUNTER — Ambulatory Visit: Payer: Medicare PPO

## 2015-08-28 ENCOUNTER — Ambulatory Visit: Payer: Medicare PPO

## 2015-08-28 DIAGNOSIS — I890 Lymphedema, not elsewhere classified: Secondary | ICD-10-CM

## 2015-08-28 DIAGNOSIS — I872 Venous insufficiency (chronic) (peripheral): Secondary | ICD-10-CM

## 2015-08-29 ENCOUNTER — Ambulatory Visit: Payer: Medicare PPO

## 2015-08-29 NOTE — Progress Notes (Signed)
[facility]  [address]  [place], [place]  [address]  Phone:  [phone]  Fax:  [phone]    Physical Therapy Daily Note    PATIENT: Rachael [name] DOB: [birth year]   MR #: [id number]  AGE: 79 y.o.    REFERRING MD:  Maebelle Munroe, DO PRIMARY MD: Jocelyn Lamer, DO    CERTIFICATION DATES:   Certification Period  Start Date of Certification Period: 08/12/15  End Date of Certification Period: 10/10/15 DIAGNOSES:    Lymphedema, not elsewhere classified [I89.0]  I87.2 Venous Insufficiency        Date of Service PT Received On: 08/28/15   Treatment Time Start Time: 0905 to Stop Time: 1000   Time Calculation Time Calculation (min): 55 min   Visit # PT Visit  PT Visit Number: 11   Units Billed PT Evaluation  $ PT Test Measurements w/Rep (97750): 1 unit     Orthotics/Prosthetics  $ PT Orthotics Fit/Training (29528): 3 Units     Medications:  Patient/Caregiver does not report changes to medication at this time.    SUBJECTIVE:     Patient presents for physical therapy today alone.   Patient states that her daughter has been helping her donn the compression wraps.    Pain:    Patient denies pain today.    OBJECTIVE:  General inspection/observation:    Patient mobility today: arrives walking independently without device.  Arrives wearing Farrow 4000 leggings and Farrow Hybrid liners, and skin intact upon removal.  Skin Temperature - normal    Treatment Performed Today:   LIMB VOLUMETRY: Measured limb volume.  B LE limb volumes decreased - see note dated today in chart for details.  GARMENT FITTING: Inspected fit of B Farrow 4000 leggings and B Farrow hybrid socks  and reviewed don/doff of garment.  Therapist provided cues for appropriate sequence and technique to prevent skin breakdown and ensure appropriate compression.    Education/Home Exercise Program:    Patient was educated on:  Proper donning / doffing of garments for safe and effective  compression.  Wear and care of garments.    Patient did not demonstrate understanding of this education.      No Home Exercise Program issued today.    ASSESSMENT:  Problems - B LE lymphedema  Progress - garment order only partially correct - contacted [facility] to correct.  Limb volumes indicate edema is well controlled using garments, however, Farrow 4000s are too long, she will need regular length leggings for better fit.      Goals (copied from last note):  Short Term Goals: to be met by 08/26/2015.  PROGRESSING - 1. Patient will verbalize understanding of lymphedema risk reduction recommendations, early identification of edema exacerbation and cellulitis, and appropriate self care strategies.  PROGRESSING - 2. Patient and/or caregiver(s) will demonstrate independent application of multi-layer compression bandaging to assist with reduction of limb volume initially.  MET - 3. Patient will demonstrate objective edema reduction via limb volumetry, measured weekly, with stabilization of edema reduction within 2-3 weeks to decrease risk of infection and wounds.   NOT ADDRESSED TODAY - 4. Patient will improve score on the ABC by at least 100 points to decrease risk for falls and increase safety with functional mobility.    Long Term Goals: to be met by 11/09/2015.  PROGRESSING - 1. Patient will obtain well-fitting day and nighttime compression garments and demonstrate proper don/doff and care of garments  to assist with control of lymphedema.  PROGRESSING - 2. Patient will be able to maintain/improve achieved edema reduction with an interim edema management program while awaiting delivery of compression garments  NOT ADDRESSED TODAY - 3. Patient will be able to maintain optimal control of B LE lymphedema with well fitting day and nighttime compression garments and home edema management program by discharge.  NOT ADDRESSED TODAY - 4. Patient will improve score on the ABC by at least 200 points to decrease risk for  falls and increase safety with functional mobility.  NOT ADDRESSED TODAY - 5. Patient will be able to sweep the floor with at least 70% confidence that she will not lose her balance or become unsteady.    Plan: Patient Requires Continued Skilled Care to focus on self-management to ensure gains of initial reductive phase are maintained long term.     Continue with established plan of care with focus on limb volumetry, patient education in management of compression garments, assess fit and effectiveness of garment for next visit.    Therapist Signature:    Carvel Getting, PT, DPT, CLT    [place] # [id number]  08/29/2015

## 2015-08-29 NOTE — Progress Notes (Signed)
Visit 1 2 3 4 5 6    Right Limb Volume (ml) 7291 7333 7225 7060 6659 0   Left Limb Volume (ml) 7002 7108 7028 6767 6370 0   Right-Left (ml) 289.4 226 197 293 289    Right-Left (%) 4 3 3 4 5     Right: % Vol change  0.6 -0.9 -3.2 -8.7    Left:  % Vol change  1.5 0.4 -3.3 -9.0        Visit 1 2 3 4 5 6    Right Proximal (ml) 4857 4790 5007 4854 4553 0   Right Distal (ml) 2434 2544 2219 2206 2106 0   Left Proximal (ml) 4888 4933 5021 4806 4487 0   Left Distal (ml) 2113 2175 2007 1961 1883 0   Right: Total 7291 7333 7225 7060 6659 0   Left:  Total 7002 7108 7028 6767 6370 0          Visit 5            1    Affected Limb Limb       Segment          BILATERAL Length       Length (cm) Total # Right  Left       52      4 Segments Proximal Distal Proximal Distal    From data there are 13 full segments plus one partial segment of length = 0 13 4553 2106 4487 1883    Enter Circumferences in yellow cells below (columns C and D)        cm Note that the first circumference pair to be entered         cm from for "0" cm corresponds to either the wrist or ankle Limb Volumes Right Left  Edema %Edema   wrist or Circumferences (cm) segment    Total Volume (ml) 6659 6370 2057 NA NA   ankle Right Left number Volume (ml) Area (cm2) Limb only (ml) 6659 6370  1     0 25.8 25.5  Right Left Right Left  0 0       4 26.1 24.2 1 214 197 104 100          8 30 27.1 2 251 210 114 103          12 33.6 31.6 3 322 275 128 119          16 36.8 34.8 4 395 351 142 134          20 38.2 36.7 5 448 407 150 143          24 39.1 37.9 6 476 443 155 149          28 39.1 37 7 487 447 156 150          32 39.6 38.1 8 493 449 157 150          36 42.7 42.1 9 540 513 166 162          40 44.8 44.6 10 610 599 176 174          44 48.4 48.5 11 692 691 188 188          48 52.4 53.1 12 809 822 204 207          52 55.2 57.1 13 922 967 217 223  Therapist Moises Terpstra         Pt. Name Rachael [name]   Date 12-Oct-16      Copyright 2004,  [employer] ID [id number]   Tx number

## 2015-08-30 ENCOUNTER — Ambulatory Visit: Payer: Medicare PPO

## 2015-08-30 DIAGNOSIS — I890 Lymphedema, not elsewhere classified: Secondary | ICD-10-CM

## 2015-08-30 DIAGNOSIS — I872 Venous insufficiency (chronic) (peripheral): Secondary | ICD-10-CM

## 2015-08-31 NOTE — Progress Notes (Signed)
Visit 1 2 3 4 5 6    Right Limb Volume (ml) 7291 7333 7225 7060 6659 6874   Left Limb Volume (ml) 7002 7108 7028 6767 6370 6482   Right-Left (ml) 289.4 226 197 293 289 393   Right-Left (%) 4 3 3 4 5 6    Right: % Vol change  0.6 -0.9 -3.2 -8.7 -5.7   Left:  % Vol change  1.5 0.4 -3.3 -9.0 -7.4     Visit 1 2 3 4 5 6    Right Proximal (ml) 4857 4790 5007 4854 4553 4647   Right Distal (ml) 2434 2544 2219 2206 2106 2227   Left Proximal (ml) 4888 4933 5021 4806 4487 4539   Left Distal (ml) 2113 2175 2007 1961 1883 1942   Right: Total 7291 7333 7225 7060 6659 6874   Left:  Total 7002 7108 7028 6767 6370 6482      From data there are 13 full segments plus one partial segment of length = 0 13 4647 2227 4539 1942    Enter Circumferences in yellow cells below (columns C and D)        cm Note that the first circumference pair to be entered         cm from for "0" cm corresponds to either the wrist or ankle Limb Volumes Right Left  Edema %Edema   wrist or Circumferences (cm) segment    Total Volume (ml) 6874 6482 2093 NA NA   ankle Right Left number Volume (ml) Area (cm2) Limb only (ml) 6874 6482  1     0 27.1 25.8  Right Left Right Left  0 0       4 26.7 24.7 1 230 203 108 101          8 30.8 27.3 2 264 215 117 105          12 34.5 31.7 3 340 278 132 120          16 37.8 35.7 4 416 362 146 136          20 39.2 37.5 5 472 427 154 147          24 40.4 38.3 6 505 457 159 152          28 39.4 37.8 7 507 461 160 152          32 39.4 37.7 8 494 454 158 151          36 43.1 42.8 9 542 517 167 164          40 45.7 45.2 10 628 617 179 177          44 48.9 48.8 11 713 704 191 190          48 53 53.2 12 827 829 206 207          52 55.4 56.5 13 936 958 218 221  Therapist Laryn Venning         Pt. Name Rachael [name]   Date 14-Oct-16      Copyright 2004,  [employer] ID [id number]   Tx number

## 2015-08-31 NOTE — Progress Notes (Signed)
[facility]  [address], [address]  [place], [place]  [address]  Phone:  [phone]  Fax:  [phone]    Physical Therapy Daily Note    PATIENT: Rachael [name] DOB: 03-30-1936   MR #: [id number]  AGE: 79 y.o.    REFERRING MD:  Maebelle Munroe, DO PRIMARY MD: Jocelyn Lamer, DO    CERTIFICATION DATES:   Certification Period  Start Date of Certification Period: 08/12/15  End Date of Certification Period: 10/10/15 DIAGNOSES:    Lymphedema, not elsewhere classified [I89.0]  I87.2 Venous Insufficiency        Date of Service PT Received On: 08/30/15   Treatment Time Start Time: 0807 to Stop Time: 0855   Time Calculation Time Calculation (min): 48 min   Visit # PT Visit  PT Visit Number: 12   Units Billed PT Evaluation  $ PT Test Measurements w/Rep (97750): 1 unit     Orthotics/Prosthetics  $ PT Orthotics Fit/Training (46962): 2 Units     Medications:  Patient/Caregiver does not report changes to medication at this time.    SUBJECTIVE:     Patient presents for physical therapy today alone.   Patient reports that one leg was itching a lot overnight, so she removed her Farrow 4000 leggings during the night.    Pain:    Patient denies pain today.    OBJECTIVE:  General inspection/observation:    Patient mobility today: arrives walking independently without device.  Arrives not wearing any compression.  Skin intact, although, R leg appearing slightly red - discussed use of OTC anti-itch remedies such as topical benadryl or cortizone cream.    Skin Temperature - normal.    Treatment Performed Today:   LIMB VOLUMETRY: Measured limb volume.  Slight increased in limb volumes bilaterally - see note dated today in chart for details.  GARMENT FITTING: checked fit of Farrow 4000s.  She ordered a new set from [employer] (correct length) which should arrive today.  She will return the wrong length ones to [facility] for a refund.  Reviewed donn and  doff of garment.      Education/Home Exercise Program:  Patient was educated on:  Proper donning / doffing of garments for safe and effective compression.    Patient did  demonstrate understanding of this education.      No Home Exercise Program issued today.    ASSESSMENT:  Problems - edema in B LE, and incorrect fit of garements  Progress - B LE slightly increased today - likely due to night spent without compression.  Expect better fit of garments when correct length Farrow 4000 leggings arrive later today.      Goals (copied from last note):  Short Term Goals: to be met by 08/26/2015.  PROGRESSING - 1. Patient will verbalize understanding of lymphedema risk reduction recommendations, early identification of edema exacerbation and cellulitis, and appropriate self care strategies.  PROGRESSING - 2. Patient and/or caregiver(s) will demonstrate independent application of multi-layer compression bandaging to assist with reduction of limb volume initially.  MET - 3. Patient will demonstrate objective edema reduction via limb volumetry, measured weekly, with stabilization of edema reduction within 2-3 weeks to decrease risk of infection and wounds.   NOT ADDRESSED TODAY - 4. Patient will improve score on the ABC by at least 100 points to decrease risk for falls and increase safety with functional mobility.    Long Term Goals: to be met  by 11/09/2015.  PROGRESSING - 1. Patient will obtain well-fitting day and nighttime compression garments and demonstrate proper don/doff and care of garments to assist with control of lymphedema.  PROGRESSING - 2. Patient will be able to maintain/improve achieved edema reduction with an interim edema management program while awaiting delivery of compression garments  NOT ADDRESSED TODAY - 3. Patient will be able to maintain optimal control of B LE lymphedema with well fitting day and nighttime compression garments and home edema management program by discharge.  NOT ADDRESSED TODAY -  4. Patient will improve score on the ABC by at least 200 points to decrease risk for falls and increase safety with functional mobility.  NOT ADDRESSED TODAY - 5. Patient will be able to sweep the floor with at least 70% confidence that she will not lose her balance or become unsteady.    Plan: Patient Requires Continued Skilled Care to focus on self-management to ensure gains of initial reductive phase are maintained long term.     Continue with established plan of care with focus on limb volumetry, garment fitting, assess fit and effectiveness of garment for next visit.    Therapist Signature:    Carvel Getting, PT, DPT, CLT    [place] # [id number]  08/31/2015

## 2015-09-04 ENCOUNTER — Ambulatory Visit: Payer: Medicare PPO

## 2015-09-04 DIAGNOSIS — I890 Lymphedema, not elsewhere classified: Secondary | ICD-10-CM

## 2015-09-04 DIAGNOSIS — I872 Venous insufficiency (chronic) (peripheral): Secondary | ICD-10-CM

## 2015-09-05 NOTE — Progress Notes (Addendum)
[facility]  [address]  [place], [place]  [address]  Phone:  [phone]  Fax:  [phone]    Physical Therapy Daily Note    PATIENT: Rachael [name] DOB: 19-Dec-1935   MR #: [id number]  AGE: 79 y.o.    REFERRING MD:  Maebelle Munroe, DO PRIMARY MD: Jocelyn Lamer, DO    CERTIFICATION DATES:   Certification Period  Start Date of Certification Period: 08/12/15  End Date of Certification Period: 10/10/15 DIAGNOSES:    Lymphedema, not elsewhere classified [I89.0]  I87.2 Venous Insufficiency        Date of Service PT Received On: 09/04/15   Treatment Time Start Time: 0910 to Stop Time: 1000   Time Calculation Time Calculation (min): 50 min   Visit # PT Visit  PT Visit Number: 13   Units Billed PT Evaluation  $ PT Test Measurements w/Rep (46962): 1 unit Therapeutic Interventions  $ PT Gait Training (95284): 1 Unit   Orthotics/Prosthetics  $ PT Orthotics Fit/Training (13244): 1 Unit     Medications:  Patient/Caregiver does report changes to medication at this time.  Synthroid and methotrexate added.    Precautions:   Falls    SUBJECTIVE:     Patient presents for physical therapy today alone.   Patient reports she has been using her new Farrow 4000 leggings for a few days, and that she returned ones that were too long to [facility]. Patient also expressing concern for balance. She states that yesterday while walking around, she felt quite unsteady on her legs. She inquired about the use of an assistive device.    Pain:    Patient denies pain today.    OBJECTIVE:  General inspection/observation:    Patient mobility today: arrives walking independently and carefully without device.  Patient arrived wearing her compression garments appropriately on her legs. She reports she was able to get them on independently today.  Skin intact upon removal.  Skin Temperature - normal    Treatment Performed Today:   LIMB VOLUMETRY:  Limb volumes indicate  that right lower extremity continues to improve, however left lower extremity is generally similar in girths, except for the top one or two points which are increased. This increase results in an overall increase in volume of the entire leg.  See note dated today in chart for details.  GARMENT FITTING: Inspected fit of Farrow 4000 size SMALL REGULAR and reviewed don/doff of garment.  Therapist provided cues for appropriate sequence and technique to prevent skin breakdown and ensure appropriate compression.    Discussed with patient the need for balance training. I explained to her that this was part of her initial plan of care, and since we have now completed the lymphedema portion, we can move on to working on balance deficits. Patient states that she will go and look at her calendar, and call the office to make more appointments.   GAIT TRAINING:  Worked on training with single-point cane, as well as instruction on appropriate length of the cane. Focus on gait sequence with SPC.    Also worked on [employer]. Instructed patient on safety while using Rollator, and educated on how to use the brakes. Patient reporting that Rollator feels more secure, and she likes that it has a seat so she could sit down if she needed to.      ASSESSMENT:  Patient has done well with Lymphedema management overall, her limb  volumes have decreased since initial evaluation. Her right lower extremity has responded better than her left, although this was the leg that was more involved.  Although ABC has not been assessed recently, patient reports of feeling unsteady on feet indicate that she continues to need balance training for safety. Expect patient will return for balance training in the next week or so.      Goals (copied from last note):  Short Term Goals: to be met by 08/26/2015.  MET - 1. Patient will verbalize understanding of lymphedema risk reduction recommendations, early identification of edema exacerbation and  cellulitis, and appropriate self care strategies.  MET - 2. Patient and/or caregiver(s) will demonstrate independent application of multi-layer compression bandaging to assist with reduction of limb volume initially.  MET - 3. Patient will demonstrate objective edema reduction via limb volumetry, measured weekly, with stabilization of edema reduction within 2-3 weeks to decrease risk of infection and wounds.   PROGRESSING - 4. Patient will improve score on the ABC by at least 100 points to decrease risk for falls and increase safety with functional mobility.    Long Term Goals: to be met by 11/09/2015.  MET - 1. Patient will obtain well-fitting day and nighttime compression garments and demonstrate proper don/doff and care of garments to assist with control of lymphedema.  MET - 2. Patient will be able to maintain/improve achieved edema reduction with an interim edema management program while awaiting delivery of compression garments  PROGRESSING - 3. Patient will be able to maintain optimal control of B LE lymphedema with well fitting day and nighttime compression garments and home edema management program by discharge.  PROGRESSING - 4. Patient will improve score on the ABC by at least 200 points to decrease risk for falls and increase safety with functional mobility.  NOT ADDRESSED TODAY - 5. Patient will be able to sweep the floor with at least 70% confidence that she will not lose her balance or become unsteady.      Plan: Patient Requires Continued Skilled Care to focus on balance training.     Continue with established plan of care with focus on dynamic balance exercises in standing, and balance assesment (FGA) for next visit.    Therapist Signature:    Carvel Getting, PT, DPT, CLT    [place] # [id number]  09/05/2015

## 2015-09-10 ENCOUNTER — Other Ambulatory Visit: Payer: Self-pay

## 2015-09-11 ENCOUNTER — Encounter
Admission: RE | Disposition: A | Discharge status: Home / Self Care | Payer: Self-pay | Source: Ambulatory Visit | Attending: Interventional Radiology and Diagnostic Radiology

## 2015-09-11 ENCOUNTER — Ambulatory Visit
Admission: RE | Admit: 2015-09-11 | Discharge: 2015-09-11 | Disposition: A | Discharge status: Home / Self Care | Payer: Medicare PPO | Source: Ambulatory Visit | Attending: Interventional Radiology and Diagnostic Radiology | Admitting: Interventional Radiology and Diagnostic Radiology

## 2015-09-11 ENCOUNTER — Ambulatory Visit: Payer: Medicare PPO | Admitting: Interventional Radiology and Diagnostic Radiology

## 2015-09-11 DIAGNOSIS — I87321 Chronic venous hypertension (idiopathic) with inflammation of right lower extremity: Secondary | ICD-10-CM | POA: Insufficient documentation

## 2015-09-11 DIAGNOSIS — R609 Edema, unspecified: Secondary | ICD-10-CM

## 2015-09-11 DIAGNOSIS — R600 Localized edema: Secondary | ICD-10-CM | POA: Insufficient documentation

## 2015-09-11 SURGERY — SCLEROTHERAPY ULTRASOUND GUIDED
Laterality: Right

## 2015-09-11 MED ORDER — SODIUM TETRADECYL SULFATE 3 % IV SOLN
2.0000 mL | Freq: Once | INTRAVENOUS | Status: DC
Start: 2015-09-11 — End: 2015-09-11

## 2015-09-11 MED ORDER — SODIUM TETRADECYL SULFATE 3 % IV SOLN
INTRAVENOUS | Status: DC
Start: 2015-09-11 — End: 2015-09-11
  Filled 2015-09-11: qty 2

## 2015-09-11 NOTE — Discharge Summary -  Nursing (Signed)
Pt toleratered procedure well.  Denies pain.  Pt clean stockings placed on pt right and left leg.  Pt ambulated independently.  Discharge instructions reviewed with pt.  All questions answered.  Pt did not esign but received copy of discharge instructions.  Follow up appointments made for patient and appointment cards given.  Pt discharged to home

## 2015-09-11 NOTE — Brief Op Note (Signed)
BRIEF IR PROCEDURE NOTE    Date Time: 09/11/2015 5:15 PM    Patient Name:   Rachael [name]    Date of Operation:   09/11/2015    Providers Performing:   Surgeon(s):  Pollyann Kennedy, MD    Assistant (s): Milinda Hirschfeld, RCIS     Operative Procedure:   Procedure(s):  Sclerotherapy Ultrasound Guided    Preoperative Diagnosis:   Pre-Op Diagnosis Codes:     * Edema, unspecified type [R60.9]    Postoperative Diagnosis:   Same    Anesthesia:   (  ) FENTANYL  (  ) VERSED  (x  ) LOCAL  (  ) GENERAL ANESTHESIA (DEPT OF ANESTHESIOLOGY) )    Estimated Blood Loss:    0    Findings:   USG sclero, right GSV, SSV    Complications:   None.      Signed by: Pollyann Kennedy, MD                                                                              [place] IVR

## 2015-09-11 NOTE — Discharge Instructions (Signed)
Post Ultrasound-Guided Sclerotherapy Instructions    Your sclerotherapy procedure was performed by Dr. Varma.    All patients receiving large vein treatments are fitted with surgical compression stockings.    Venous compression is very important to obtain optimal results.  Always bring your compression hose with you to appointments or procedures.      We recommend that you sleep in your stockings the first night only.    Following this, the stockings must be worn every day for minimum of 14 days. They may be removed while sleeping, bathing or swimming.    If you experience any leg pain or mild swelling at any point during your treatment, put your stockings on.    If your legs are uncomfortable after treatment, walking will help.  An anti-inflammatory medication like ibuprofen (brand names Motrin, Advil) will help if you have mild aching.    There is no need to modify your normal activity.  You can return to work immediately. Avoid high impact aerobics, weight lifting and running for 2 weeks.    We will check your leg in 2 weeks.    This is a very safe, successful treatment.  However, the response following the injections should be understood:      The injected areas may be sensitive and swell slightly.    The area may look black and blue.    Some tender lumps may form along the treated veins.    You may feel a pulling sensation along the vein.    These responses to the injections are normal and temporary. Many patients go through the treatment with none of these reactions.        To contact us regarding any problems with your leg after you return home please call the department at [phone] during business hours.  After 5 pm or on weekends call [phone].  Give your name phone number and the name of the procedure.  An on-call physician is always available to return your call.

## 2015-09-25 ENCOUNTER — Ambulatory Visit
Admission: RE | Admit: 2015-09-25 | Discharge: 2015-09-25 | Disposition: A | Discharge status: Home / Self Care | Payer: Medicare PPO | Source: Ambulatory Visit | Attending: Interventional Radiology and Diagnostic Radiology | Admitting: Interventional Radiology and Diagnostic Radiology

## 2015-09-25 DIAGNOSIS — M7989 Other specified soft tissue disorders: Secondary | ICD-10-CM | POA: Insufficient documentation

## 2015-09-25 DIAGNOSIS — Z9889 Other specified postprocedural states: Secondary | ICD-10-CM | POA: Insufficient documentation

## 2015-10-03 ENCOUNTER — Ambulatory Visit: Payer: Medicare PPO

## 2015-10-03 ENCOUNTER — Ambulatory Visit: Payer: Medicare PPO | Attending: Interventional Radiology and Diagnostic Radiology | Admitting: Body Imaging

## 2015-10-03 DIAGNOSIS — Z5189 Encounter for other specified aftercare: Secondary | ICD-10-CM | POA: Insufficient documentation

## 2015-10-03 DIAGNOSIS — I872 Venous insufficiency (chronic) (peripheral): Secondary | ICD-10-CM | POA: Insufficient documentation

## 2015-10-06 NOTE — Progress Notes (Signed)
Interventional Radiology Progress Note     Date Time:  10/06/2015  10:38 PM                        Patient Name: Rachael [name],Rachael [name]    Rachael [name]  is a 79 y.o. year old  female who presents for follow up s/p right great and small saphenous vein USG sclerotherapy 09/11/15.  Denies right thigh or calf numbness/tingling or loss of sensation.  Denies purulent drainage, erythema or edema.  States has been consistently wearing compression hose as prescribed.      Medications:    Current Outpatient Prescriptions   Medication Sig Dispense Refill   . albuterol (PROVENTIL) (2.5 MG/3ML) 0.083% nebulizer solution TAKE (2.5MG ) BY NEBULIZATION EVERY 4 HOURS AS NEEDED FOR WHEEZING OR SHORTNESS OF BREATH (COUGH)  0   . ALPRAZolam (XANAX) 0.5 MG tablet TAKE 1 TABLET BY MOUTH EVERY OTHER DAY AS NEEDED  0   . Calcium Carbonate-Vit D-Min (CALCIUM 1200 PO) Take by mouth.     . diltiazem (CARDIZEM CD) 240 MG 24 hr capsule Take 240 mg by mouth daily.     . DULoxetine (CYMBALTA) 60 MG capsule Take 60 mg by mouth 2 (two) times daily.     . folic acid (FOLVITE) 1 MG tablet      . gabapentin (NEURONTIN) 100 MG capsule      . hydrochlorothiazide (HYDRODIURIL) 25 MG tablet      . levothyroxine (SYNTHROID, LEVOTHROID) 50 MCG tablet Take 50 mcg by mouth Once a day at 6:00am.     . metFORMIN (GLUCOPHAGE-XR) 500 MG 24 hr tablet      . methotrexate 2.5 MG tablet Take 10 mg by mouth once a week.        . metoprolol XL (TOPROL-XL) 25 MG 24 hr tablet      . nitrofurantoin, macrocrystal-monohydrate, (MACROBID) 100 MG capsule TAKE 1 CAPSULE(S) EVERY 12 HOURS BY ORAL ROUTE.  0   . potassium chloride (K-TAB,KLOR-CON) 10 MEQ tablet      . predniSONE (DELTASONE) 5 MG tablet Take 5 mg by mouth daily.     Marland Kitchen SPIRIVA HANDIHALER 18 MCG inhalation capsule      . SYMBICORT 160-4.5 MCG/ACT inhaler      . traMADol (ULTRAM) 50 MG tablet TAKE 1 TABLET BY MOUTH TWICE DAILY AS NEEDED FOR PAIN  1   . traZODone (DESYREL) 100 MG tablet Take 100 mg by mouth nightly.      Marland Kitchen ZETIA 10 MG tablet      . pantoprazole (PROTONIX) 40 MG tablet        No current facility-administered medications for this visit.       Allergies:  Allergies   Allergen Reactions   . Tolectin [Tolmetin] Swelling   . Latex Itching       Physical exam:  Constitutional:  female, appears her stated age, in no apparent distress and well developed and well nourished.   Right lower extremity:  No bruising or ecchymosis noted along treated veins      Imaging: US Venous Insufficiency Duplex Doppler Leg Bilateral    09/25/2015  CLINICAL HISTORY: Follow-up to right great saphenous vein and small saphenous vein sclerotherapy. The right and left lower extremity venous systems were evaluated using high resolution gray scale imaging, color Doppler, and spectral waveform analysis.  No deep or superficial thrombosis is identified.  The deep venous systems were evaluated from the posterior tibial veins through the common femoral  veins.  Evaluation for reflux was performed in the standing position. Examination of the deep systems is normal without deep system reflux.The left popliteal vein and left posterior tibial vein demonstrate mild reflux which is likely not a clinically significant finding. Examination of the superficial systems shows successful closure of the right small saphenous vein at the calf and ankle level with patency of the right saphenous popliteal junction. The right great saphenous vein at the calf level appears closed with patency of the remaining right great saphenous vein. The left great saphenous vein appears competent other than mild reflux at the left great saphenous vein calf level. Mild reflux of the left small saphenous vein is noted at the calf level only.     09/25/2015  1. Successful closure of the right great saphenous vein at the calf level only and at the right small saphenous vein as described above status post sclerotherapy. 2.  Normal deep systems. Lemar Livings, MD 09/25/2015 2:54 PM        Assessment:  Discussed with Dr. George Hugh   1. Superficial venous insufficiency, right lower extremity, status post right great and small saphenous vein USG sclerotherapy 09/11/15  2. Follow up venous insufficiency doppler/duplex completed & shows successful treatment.      Plan:  1. No additional treatment necessary at this time  2. Continue wearing compression stockings if helpful; ambulate as much as possible.  2. RTC in 1 year after follow-up venous insufficiency doppler/duplex/Dr. Sallee Lange      Signed by: Horton Chin ACNP-BC  Interventional Radiology  [facility], [facility]  [phone] (office), [employer] ext [phone]

## 2015-10-16 ENCOUNTER — Ambulatory Visit: Payer: Medicare PPO

## 2015-10-18 ENCOUNTER — Ambulatory Visit: Payer: Medicare PPO

## 2015-10-18 ENCOUNTER — Telehealth: Payer: Medicare PPO

## 2015-10-18 DIAGNOSIS — E119 Type 2 diabetes mellitus without complications: Secondary | ICD-10-CM

## 2015-10-18 NOTE — Pre-Procedure Instructions (Signed)
PATIENT WILL ARRIVE 0600 FOR 0730 SURGERY ON 10/31/2015. EKG AND LABS ON CHART-NEW LABS DRAWN BY PCPCLEARANCE & LABS REQ FROM PCP-LAST NOTES & CLEARANCE REQ PULMONARY. CLEARANCE REQ DR ANIS.  WILL TAKE ALL INHALALANTS AND USE NEBULIZER IF NEEDED AND BRING ALBUTEROL. WILL TAKE CARTIA, TRAMADOL, METOPROLOL, CYMBALTA, & THYROID.Marland Kitchen FBS ON ARRIVAL

## 2015-10-21 ENCOUNTER — Ambulatory Visit: Payer: Medicare PPO

## 2015-10-23 ENCOUNTER — Ambulatory Visit: Payer: Medicare PPO

## 2015-10-25 ENCOUNTER — Ambulatory Visit: Payer: Medicare PPO

## 2015-10-28 ENCOUNTER — Ambulatory Visit: Payer: Medicare PPO

## 2015-10-30 ENCOUNTER — Ambulatory Visit: Payer: Medicare PPO

## 2015-10-30 NOTE — Anesthesia Preprocedure Evaluation (Addendum)
Anesthesia Evaluation    AIRWAY    Mallampati: III    TM distance: >3 FB  Neck ROM: full  Mouth Opening:full   CARDIOVASCULAR    cardiovascular exam normal       DENTAL    no notable dental hx     PULMONARY    pulmonary exam normal     OTHER FINDINGS                    Anesthesia Plan    ASA 3     general               (COPD, OSA, HTN, NIDDM, Hypothyroid  Discussed riks)      intravenous induction   Detailed anesthesia plan: general endotracheal        Post op pain management: per surgeon    informed consent obtained    Plan discussed with CRNA.    ECG reviewed  pertinent labs reviewed             Chart reviewed. Available labs, consultations and medical history reviewed.     Allergies:  Allergies   Allergen Reactions   . Tolectin [Tolmetin] Swelling   . Latex Itching     All Rx:  Scheduled Meds:  Continuous Infusions:  PRN Meds:.  Problem List:  Patient Active Problem List    Diagnosis Date Noted   . Venous insufficiency of right leg    . Lymphedema of right lower extremity      History:  Past Medical History   Diagnosis Date   . Hypertension    . Chronic obstructive pulmonary disease    . Arthritis    . Polymyalgia rheumatica 09/2014   . Venous insufficiency of right leg 2016     also has lymphedema-KEEPS HER LEGS WRAPPED EXCEPT FOR WHEN SHE BATHES   . Lymphedema of right lower extremity    . Type II or unspecified type diabetes mellitus without mention of complication, not stated as uncontrolled    . Gastroesophageal reflux disease    . Hypothyroidism    . Hyperlipidemia    . Type 2 diabetes mellitus, controlled    . Sleep apnea      USES CPAP NIGHTLY   . Hiatal hernia    . Low back pain    . 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 Anxiety    . Incontinent of urine 2014     -WEARS DIAPERS-WILL BRING HER OWN     Past Surgical History   Procedure Laterality Date   . Back surgery  2014   . Carpal tunnel release Bilateral 1978   . Hysterectomy  1985     Labs    Lab Results   Component Value Date     WBC 10.62 08/02/2015    HGB 12.4 08/02/2015    HCT 39.1 08/02/2015    PLT 282 08/02/2015    ALT 9 08/02/2015    AST 10 08/02/2015    NA 142 08/02/2015    K 3.7 08/02/2015    CL 104 08/02/2015    CO2 28 08/02/2015    CREAT 0.8 08/02/2015    BUN 11.0 08/02/2015    TSH 4.22 03/18/2015    GLU 133* 08/02/2015    HGBA1C 6.7* 03/18/2015     EKG- NSR    Based on currently available data, patient deemed acceptable risk to proceed with planned procedure and anesthesia, pending  final evaluation at time of pre-procedure anesthesia interview and assessment.

## 2015-10-31 ENCOUNTER — Encounter
Admission: RE | Disposition: A | Discharge status: Home / Self Care | Payer: Self-pay | Source: Ambulatory Visit | Attending: Surgery

## 2015-10-31 ENCOUNTER — Ambulatory Visit: Payer: Self-pay

## 2015-10-31 ENCOUNTER — Ambulatory Visit: Payer: Medicare PPO | Admitting: Surgery

## 2015-10-31 ENCOUNTER — Ambulatory Visit: Payer: Medicare PPO | Admitting: Anesthesiology

## 2015-10-31 ENCOUNTER — Ambulatory Visit
Admission: RE | Admit: 2015-10-31 | Discharge: 2015-10-31 | Disposition: A | Discharge status: Home / Self Care | Payer: Medicare PPO | Source: Ambulatory Visit | Attending: Surgery | Admitting: Surgery

## 2015-10-31 DIAGNOSIS — E785 Hyperlipidemia, unspecified: Secondary | ICD-10-CM | POA: Insufficient documentation

## 2015-10-31 DIAGNOSIS — K660 Peritoneal adhesions (postprocedural) (postinfection): Secondary | ICD-10-CM | POA: Insufficient documentation

## 2015-10-31 DIAGNOSIS — K8010 Calculus of gallbladder with chronic cholecystitis without obstruction: Secondary | ICD-10-CM

## 2015-10-31 DIAGNOSIS — Z79899 Other long term (current) drug therapy: Secondary | ICD-10-CM | POA: Insufficient documentation

## 2015-10-31 DIAGNOSIS — E119 Type 2 diabetes mellitus without complications: Secondary | ICD-10-CM

## 2015-10-31 DIAGNOSIS — M069 Rheumatoid arthritis, unspecified: Secondary | ICD-10-CM | POA: Insufficient documentation

## 2015-10-31 DIAGNOSIS — K429 Umbilical hernia without obstruction or gangrene: Secondary | ICD-10-CM | POA: Insufficient documentation

## 2015-10-31 DIAGNOSIS — J449 Chronic obstructive pulmonary disease, unspecified: Secondary | ICD-10-CM | POA: Insufficient documentation

## 2015-10-31 DIAGNOSIS — I10 Essential (primary) hypertension: Secondary | ICD-10-CM | POA: Insufficient documentation

## 2015-10-31 HISTORY — DX: Calculus of gallbladder with chronic cholecystitis without obstruction: K80.10

## 2015-10-31 LAB — GLUCOSE WHOLE BLOOD - POCT
Whole Blood Glucose POCT: 183 mg/dL — ABNORMAL HIGH (ref 70–100)
Whole Blood Glucose POCT: 265 mg/dL — ABNORMAL HIGH (ref 70–100)

## 2015-10-31 SURGERY — ROBOT ASSISTED, LAPAROSCOPIC, CHOLECYSTECTOMY - USE 297
Anesthesia: Anesthesia General | Site: Abdomen | Wound class: Clean Contaminated

## 2015-10-31 MED ORDER — NEOSTIGMINE METHYLSULFATE 1 MG/ML IJ SOLN
INTRAMUSCULAR | Status: DC | PRN
Start: 2015-10-31 — End: 2015-10-31
  Administered 2015-10-31: 2.5 mg via INTRAVENOUS

## 2015-10-31 MED ORDER — ROCURONIUM BROMIDE 50 MG/5ML IV SOLN
INTRAVENOUS | Status: AC
Start: 2015-10-31 — End: ?
  Filled 2015-10-31: qty 5

## 2015-10-31 MED ORDER — FENTANYL CITRATE (PF) 50 MCG/ML IJ SOLN (WRAP)
INTRAMUSCULAR | Status: AC
Start: 2015-10-31 — End: ?
  Filled 2015-10-31: qty 2

## 2015-10-31 MED ORDER — CEFAZOLIN SODIUM 1 G IJ SOLR
2.0000 g | Freq: Once | INTRAMUSCULAR | Status: AC
Start: 2015-10-31 — End: 2015-10-31
  Administered 2015-10-31: 2 g via INTRAVENOUS

## 2015-10-31 MED ORDER — NEOSTIGMINE METHYLSULFATE 1 MG/ML IJ SOLN
INTRAMUSCULAR | Status: AC
Start: 2015-10-31 — End: ?
  Filled 2015-10-31: qty 10

## 2015-10-31 MED ORDER — GLYCOPYRROLATE 0.2 MG/ML IJ SOLN
INTRAMUSCULAR | Status: DC | PRN
Start: 2015-10-31 — End: 2015-10-31
  Administered 2015-10-31: 0.4 mg via INTRAVENOUS

## 2015-10-31 MED ORDER — LIDOCAINE 1% BUFFERED - CNR/OUTSOURCED
0.3000 mL | Freq: Once | INTRAMUSCULAR | Status: AC
Start: 2015-10-31 — End: 2015-10-31
  Administered 2015-10-31: 0.3 mL via INTRADERMAL

## 2015-10-31 MED ORDER — ROCURONIUM BROMIDE 50 MG/5ML IV SOLN
INTRAVENOUS | Status: DC | PRN
Start: 2015-10-31 — End: 2015-10-31
  Administered 2015-10-31: 30 mg via INTRAVENOUS
  Administered 2015-10-31 (×2): 10 mg via INTRAVENOUS

## 2015-10-31 MED ORDER — LACTATED RINGERS IV SOLN
INTRAVENOUS | Status: DC
Start: 2015-10-31 — End: 2015-10-31
  Administered 2015-10-31: 1000 mL via INTRAVENOUS

## 2015-10-31 MED ORDER — KETOROLAC TROMETHAMINE 30 MG/ML IJ SOLN
INTRAMUSCULAR | Status: DC | PRN
Start: 2015-10-31 — End: 2015-10-31
  Administered 2015-10-31: 15 mg via INTRAVENOUS

## 2015-10-31 MED ORDER — ONDANSETRON HCL 4 MG/2ML IJ SOLN
INTRAMUSCULAR | Status: AC
Start: 2015-10-31 — End: ?
  Filled 2015-10-31: qty 2

## 2015-10-31 MED ORDER — INDOCYANINE GREEN 25 MG IV SOLR
INTRAVENOUS | Status: AC
Start: 2015-10-31 — End: ?
  Filled 2015-10-31: qty 25

## 2015-10-31 MED ORDER — INSULIN REGULAR HUMAN 100 UNIT/ML IJ SOLN
3.0000 [IU] | Freq: Once | INTRAMUSCULAR | Status: AC
Start: 2015-10-31 — End: 2015-10-31

## 2015-10-31 MED ORDER — CEFAZOLIN SODIUM 1 G IJ SOLR
INTRAMUSCULAR | Status: DC
Start: 2015-10-31 — End: 2015-10-31
  Filled 2015-10-31: qty 2000

## 2015-10-31 MED ORDER — HYDROMORPHONE HCL 1 MG/ML IJ SOLN
0.5000 mg | INTRAMUSCULAR | Status: AC | PRN
Start: 2015-10-31 — End: 2015-10-31
  Administered 2015-10-31 (×3): 0.5 mg via INTRAVENOUS

## 2015-10-31 MED ORDER — LIDOCAINE HCL (PF) 2 % IJ SOLN
INTRAMUSCULAR | Status: AC
Start: 2015-10-31 — End: ?
  Filled 2015-10-31: qty 5

## 2015-10-31 MED ORDER — PROPOFOL 10 MG/ML IV EMUL (WRAP)
INTRAVENOUS | Status: AC
Start: 2015-10-31 — End: ?
  Filled 2015-10-31: qty 20

## 2015-10-31 MED ORDER — HYDROCORTISONE SOD SUC (PF) 100 MG IJ SOLR (WRAP)
INTRAMUSCULAR | Status: AC
Start: 2015-10-31 — End: ?
  Filled 2015-10-31: qty 2

## 2015-10-31 MED ORDER — ONDANSETRON HCL 4 MG/2ML IJ SOLN
INTRAMUSCULAR | Status: DC | PRN
Start: 2015-10-31 — End: 2015-10-31
  Administered 2015-10-31: 4 mg via INTRAVENOUS

## 2015-10-31 MED ORDER — GLYCOPYRROLATE 1 MG/5ML IJ SOLN
INTRAMUSCULAR | Status: AC
Start: 2015-10-31 — End: ?
  Filled 2015-10-31: qty 5

## 2015-10-31 MED ORDER — INSULIN REGULAR HUMAN 100 UNIT/ML IJ SOLN
INTRAMUSCULAR | Status: AC
Start: 2015-10-31 — End: 2015-10-31
  Administered 2015-10-31: 3 [IU] via SUBCUTANEOUS
  Filled 2015-10-31: qty 9

## 2015-10-31 MED ORDER — BUPIVACAINE-EPINEPHRINE (PF) 0.5% -1:200000 IJ SOLN
INTRAMUSCULAR | Status: AC
Start: 2015-10-31 — End: ?
  Filled 2015-10-31: qty 30

## 2015-10-31 MED ORDER — KETOROLAC TROMETHAMINE 15 MG/ML IJ SOLN
INTRAMUSCULAR | Status: AC
Start: 2015-10-31 — End: ?
  Filled 2015-10-31: qty 1

## 2015-10-31 MED ORDER — INDOCYANINE GREEN 25 MG IV SOLR
5.0000 mg | Freq: Once | INTRAVENOUS | Status: AC
Start: 2015-10-31 — End: 2015-10-31
  Administered 2015-10-31: 5 mg via INTRAVENOUS

## 2015-10-31 MED ORDER — HYDROCODONE-ACETAMINOPHEN 5-325 MG PO TABS
1.0000 | ORAL_TABLET | ORAL | Status: AC | PRN
Start: 2015-10-31 — End: 2015-11-10

## 2015-10-31 MED ORDER — LIDOCAINE HCL 2 % IJ SOLN
INTRAMUSCULAR | Status: DC | PRN
Start: 2015-10-31 — End: 2015-10-31
  Administered 2015-10-31: 50 mg

## 2015-10-31 MED ORDER — SUCCINYLCHOLINE CHLORIDE 20 MG/ML IJ SOLN
INTRAMUSCULAR | Status: DC | PRN
Start: 2015-10-31 — End: 2015-10-31
  Administered 2015-10-31: 100 mg via INTRAVENOUS

## 2015-10-31 MED ORDER — BUPIVACAINE-EPINEPHRINE (PF) 0.5% -1:200000 IJ SOLN
INTRAMUSCULAR | Status: DC | PRN
Start: 2015-10-31 — End: 2015-10-31
  Administered 2015-10-31: 30 mL via INTRAMUSCULAR

## 2015-10-31 MED ORDER — HYDROCORTISONE SOD SUC (PF) 100 MG IJ SOLR (WRAP)
INTRAMUSCULAR | Status: DC | PRN
Start: 2015-10-31 — End: 2015-10-31
  Administered 2015-10-31: 100 mg via INTRAVENOUS

## 2015-10-31 MED ORDER — PROPOFOL INFUSION 10 MG/ML
INTRAVENOUS | Status: DC | PRN
Start: 2015-10-31 — End: 2015-10-31
  Administered 2015-10-31: 150 mg via INTRAVENOUS

## 2015-10-31 MED ORDER — FENTANYL CITRATE (PF) 50 MCG/ML IJ SOLN (WRAP)
INTRAMUSCULAR | Status: DC | PRN
Start: 2015-10-31 — End: 2015-10-31
  Administered 2015-10-31 (×4): 50 ug via INTRAVENOUS

## 2015-10-31 MED ORDER — HYDROMORPHONE HCL 1 MG/ML IJ SOLN
INTRAMUSCULAR | Status: AC
Start: 2015-10-31 — End: 2015-10-31
  Administered 2015-10-31: 0.5 mg via INTRAVENOUS
  Filled 2015-10-31: qty 1

## 2015-10-31 SURGICAL SUPPLY — 41 items
CLIP INTERNAL MEDIUM LARGE LIGATE (Clips) ×1
CLIP INTERNAL MEDIUM LARGE LIGATE NONABSORBABLE CARTRIDGE WECK (Clips) ×1 IMPLANT
CLIP INTNL PLMR MED LG WECK HEM-O-LOK LF (Clips) ×1
CORD ELECTROSURGICAL GREEN 12FT PLASTIC BIPOLAR STERILE DISPOSABLE (Cautery) ×1 IMPLANT
CORD ESURG PLS 12FT STRL BP DISP GRN (Cautery) ×2
DEVICE PUREVIEW PLUME FILTER (Tubing) ×1
DRAPE ARM 4 (Drape) ×2
DRAPE DA VINCI ARM 20X13X10IN 420291 (Drape) ×1 IMPLANT
DRESSING WND PU COVAD + 2X2IN LF STRL (Dressing) ×8 IMPLANT
GLOVE SRG NPRN PU 8.5 DRMPRN ULT SRFT (Glove) ×2
GLOVE SURGICAL 8 1/2 DERMA PRENE ULTRA (Glove) ×2
GLOVE SURGICAL 8 1/2 GAMMEX SUREFIT TECHNOLOGY POWDER FREE SMOOTH BEAD (Glove) ×2 IMPLANT
GOWN SRG FBRC 3XL XLNG STD ROYALSILK LF (Gown) ×1
GOWN SURGICAL 3XL XLONG STANDARD FABRIC (Gown) ×1
GOWN SURGICAL 3XL XLONG STANDARD FABRIC ROYALSILK LEVEL 3 NONREINFORCE (Gown) ×1 IMPLANT
IRRIGATOR SCT DVNC SI EWRST 1 8MM DISP (Suction)
IRRIGATOR SUCTION OD8 MM DA VINCI SI ENDOWRIST ONE (Suction) IMPLANT
IRRIGATOR SUCTN PUMP/HANDPIECE (Suction) IMPLANT
KIT GENERAL ROBOTIC GENERIC (Kits) ×2 IMPLANT
LIGASURE MARYLAND JAW 37CM (Disposable Instruments) ×1 IMPLANT
MASTISOL VIAL 2/3CC STRL (Skin Closure) ×2 IMPLANT
OBTURATOR BLDLS DISP 8MM (Procedure Accessories) ×2
OBTURATOR ROBOTIC BLADELESS SHORT DA VINCI SI ENDOWRIST 8MM 420023 (Procedure Accessories) ×1 IMPLANT
PAD POSITIONER CRADLE HEAD PINK DEVON 4X8X9IN FOAM ADULT 31143129 (Positioning Supplies) ×1 IMPLANT
POSITIONER HEAD FOAM ADULT (Positioning Supplies) ×2
POUCH SPEC RTRVL PU E-CTCH GLD 10MM 34.5 (Laparoscopy Supplies)
POUCH SPECIMEN RETRIEVAL L34.5 CM (Laparoscopy Supplies)
POUCH SPECIMEN RETRIEVAL L34.5 CM ERGONOMIC HANDLE LONG CYLINDRICAL (Laparoscopy Supplies) IMPLANT
PROTECTOR NERVE ULNAR (Positioning Supplies) ×2 IMPLANT
SOL IRR LR 1000ML PLS PR BTL (IV Solutions)
SOLUTION IRRIGATION LACTATED RINGERS (IV Solutions)
SOLUTION IRRIGATION LACTATED RINGERS 1000 ML PLASTIC POUR BOTTLE (IV Solutions) IMPLANT
SPONGE CHLRPRP TINT 26ML (Applicator) ×2 IMPLANT
SUTURE ABS 4-0 PC5 MNCRL MTPS 18IN MFL (Suture) ×1
SUTURE MONOCRYL 4-0 PC-5 L18 IN (Suture) ×1
SUTURE MONOCRYL 4-0 PC-5 L18 IN MONOFILAMENT UNDYED ABSORBABLE (Suture) ×1 IMPLANT
SUTURE VICRYL 0 UR6 27IN (Suture) ×2 IMPLANT
SYSTEM IMAGING 8X6IN CLEARIFY MICROFIBER WARM HUB TRCR WIPE DSPSBL (Kits) ×1 IMPLANT
SYSTEM IMG MRFBR CLEARIFY 8X6IN WRM HUB (Kits) ×2
TUBING SMOKE EVACUATOR FILTRATION (Tubing) ×1
TUBING SMOKE EVACUATOR FILTRATION PLUME-AWAY PUREVIEW LAPAROSCOPIC (Tubing) ×1 IMPLANT

## 2015-10-31 NOTE — Anesthesia Postprocedure Evaluation (Signed)
Anesthesia Post Evaluation    Patient: Rachael [name]    Procedure(s):  ROBOT ASSISTED, LAPAROSCOPIC, CHOLECYSTECTOMY, UMBILICAL HERNIA REPAIR    Anesthesia type: general    Last Vitals:   Filed Vitals:    10/31/15 0653   BP: 154/72   Temp: 36.9 C (98.5 F)   Resp: 20            Anesthesia Qualified Clinical Data Registry    Central Line      CVC insertion : NO                                               Perioperative temperature management      General/neuraxial anesthesia > or = 60 minutes (excluding CABG) : YES              > Use of intraoperative active warming : YES              > Temperature > or = 36 degrees Centigrade (96.8 degrees Farenheit) during time span from 30 minutes before up to 15 minutes after anesthesia end time : YES      Administration of antibiotic prophylaxis      Age > or = 18, with IV access, with surgical procedure for which antibiotic prophylaxis indicated, and not on chronic antibiotics : YES              > Prophylactic antibiotics within 1 hour of incision (or fluroroquinolone/vancomycin within 2 hours of incision) : YES    Medication Administration      Ordering or administration of drug inconsistent with intended drug, dose, delivery or timing : NO      Dental loss/damage      Dental injury with administration of anesthesia : NO      Difficult intubation due to unrecognized difficult airway        Elective airway procedure including but not limited to: tracheostomy, fiberoptic bronchoscopy, rigid bronchoscopy; jet ventilation; or elective use of a device to facilitate airway management such as a Glidescope : NO                > Unanticipated difficult intubation post pre-evaluation : NO      Aspiration of gastric contents        Aspiration of gastric contents : NO                    Surgical fire        Procedure requiring electrocautery/laser : YES                > Ignition/burning in invasive procedure location : NO      Immediate perioperative cardiac arrest         Cardiac arrest in OR or PACU : NO                    Unplanned hospital admission        Unplanned hospital admission for initially intended outpatient anesthesia service : NO      Unplanned ICU admission        Unplanned ICU admission related to anesthesia occurring within 24 hours of induction or start of MAC : NO      Surgical case cancellation        Cancellation of procedure after care already started by anesthesia care  team : NO      Post-anesthesia transfer of care checklist/protocol to PACU        Transfer from OR to PACU upon case conclusion : YES              > Use of PACU transfer checklist/protocol : YES     (Includes the key elements of: patient identification, responsible practitioner identification (PACU nurse or advanced practitioner), discussion of pertinent history and procedure course, intraoperative anesthetic management, post-procedure plans, acknowledgement/questions)    Post-anesthesia transfer of care checklist/protocol to ICU        Transfer from OR to ICU upon case conclusion : NO                    Post-operative nausea/vomiting risk protocol        Post-operative nausea/vomiting risk protocol : YES  Patient > or = 18 with care initiated by anesthesia team that has a risk factor screen for post-op nausea/vomiting (Includes female, hx PONV, or motion sickness, non-smoker, intended opioid administration for post-op analgesia.)    Anaphylaxis        Anaphylaxis during anesthesia services : NO    (Inclusive of any suspected transfusion reaction in association with blood-bank confirmed blood product incompatibility)              [name], 10/31/2015 9:10 AM

## 2015-10-31 NOTE — Brief Op Note (Signed)
BRIEF OP NOTE    Date Time: 10/31/2015 9:03 AM    Patient Name:   Rachael [name]    Date of Operation:   10/31/2015    Providers Performing:   Surgeon(s):  Alen Blew, MD    Assistant (s):   Circulator: Magdalen Spatz, RN  Relief Circulator: Gilman Buttner, RN  Scrub Person: Dittler, Festus Aloe    Operative Procedure:   Procedure(s):  ROBOT ASSISTED, LAPAROSCOPIC, CHOLECYSTECTOMY with firefly, UMBILICAL HERNIA REPAIR    Preoperative Diagnosis:   Pre-Op Diagnosis Codes:     * Calculus of gallbladder with cholecystitis of other acuity [K80.18]     * Umbilical hernia without obstruction and without gangrene [K42.9]    Postoperative Diagnosis:   Post-Op Diagnosis Codes:     * Calculus of gallbladder with cholecystitis of other acuity [K80.18]     * Umbilical hernia without obstruction and without gangrene [K42.9]    Anesthesia:   General    Estimated Blood Loss:    * No values recorded between 10/31/2015  7:55 AM and 10/31/2015  9:03 AM *    Implants:   * No implants in log *    Drains:   Drains: no    Specimens:        SPECIMENS (last 24 hours)      Pathology Specimens       10/31/15 0800             Additional Information    Send final report to: DR. Dorethea Clan       Specimen Information    Specimen Testing Required Routine Pathology       Specimen ID  [id number]       Specimen Description GALLBLADDER AND CONTENTS           Findings:   Cc/stones, umbi hernia     Complications:   none      Signed by: Alen Blew, MD                                                                           [place] [facility]

## 2015-10-31 NOTE — Anesthesia Postprocedure Evaluation (Signed)
Anesthesia Post Evaluation    Patient: Rachael [name]    Procedure(s):  ROBOT ASSISTED, LAPAROSCOPIC, CHOLECYSTECTOMY, UMBILICAL HERNIA REPAIR    Anesthesia type: general    Last Vitals:   Filed Vitals:    10/31/15 1100   BP: 136/65   Pulse:    Temp:    Resp:    SpO2: 95%       Patient Location: Phase II PACU      Post Pain: Patient not complaining of pain, continue current therapy    Mental Status: awake    Respiratory Function: tolerating room air    Cardiovascular: stable    Nausea/Vomiting: patient not complaining of nausea or vomiting    Hydration Status: adequate    Post Assessment: no apparent anesthetic complications               Robinette Haines Colingo-Fahlberg, 10/31/2015 1:35 PM

## 2015-10-31 NOTE — Discharge Instructions (Signed)
Discharge Instructions:  Robotic Surgery  A. General Information:  1. DO NOT DRIVE a car or operate dangerous machinery for 3-4 days, or while taking narcotic prescription pain pills.  2. DO NOT consume alcohol, tranquilizers, sleeping medications, or any non prescribed medication for 24 hours unless approved by your doctor, or as long as taking narcotic prescription medications.  3. DO NOT make important decisions or sign any important papers in the next 24 hours.  4. Have a responsible person with you tonight.  B. Activity:  1. NO heavy lifting, straining abdominal muscles, bending over a lot, yard work, house work, or sports for 2 weeks.  2. DO NOT drive for 3-4 days or while taking norco.  3. It is fine to go for walks, up and down steps, ride in a car.  4. Elevate your head when sleeping/resting.  C. Treatment:  1. You may shower 24 hours after surgery, no baths or swimming for 2 weeks.  Remove band aides or dressings before shower, but leave paper strips (steri strips) on the skin to fall off on their own.  If still on at postoperative visit, they will be removed then.  Keep incisions dry except to shower.    2. Drainage of fluid or blood is not unusual from an incision.  If occurs, can clean with peroxide and cotton ball daily and cover with dry gauze until the wound seals.  3. If a lot of bleeding occurs, can hold pressure with a gauze or cloth over the site for 10 minutes and it will usually stop.  If bleeding continues will need to call for possible evaluation in office or emergency room.  D. Medications:  1. norco_ may be taken for pain as needed, one or two tablets every 4-6 hours.  Stop the narcotic when able since you cannot take it and drive and they cause constipation.  You may switch to plain Tylenol, Advil or Aleve when able.  Many adults fine good pain relief with Advil 600-800 mg three times a day with meals.  This can cause indigestion, ulcers, and kidney problems though with long term  use.  2. Resume all normal medications.  E. Antibiotic (s) if needed.  (NONE )    F.    Begin with clear liquids and may progress to your normal diet if not nauseated.  No high fat, high protein    foods the day of surgery.     G. The following may occur after robotic surgery:  1. Shoulder or upper back ache from retained gas that should resolve in 1-2 days.  Can treat with pain medication.  2. Soreness and bruising at incision sites will resolve with time.  3. Scrotal swelling (labia in women) and bruising is often seen after hernia surgery.  4. Sore throat.  5. Fatigue may last days to weeks.  6. Difficulty urinating may occur and may need to come into emergency room for urinary catheter placement.  H. Notify Physician If:  1. Worsening of pain not improved with pain medication.  2. Persistent nausea and vomiting.  3. Fever above 101.  4. Persistent bleeding or swelling at operative site.  5. Unable to urinate and uncomfortable bladder 6-8 hours after surgery.  I. Follow up care:  Please call the office to schedule a follow up visit with Dr. Alita Waldren for 2 weeks.  In the event of any postoperative problems or questions, you may call your attending physician's office during business hours, or the answering service   after hours and on weekend to speak to the doctor on call.    Office [phone]  Answering Service       [phone]  I understand the instructions for my postoperative care as described above.  A copy of these instructions has been given to me.                  Witness     Patient/Significant Other Date

## 2015-10-31 NOTE — Transfer of Care (Signed)
Anesthesia Transfer of Care Note    Patient: Rachael [name]    Procedures performed: Procedure(s):  ROBOT ASSISTED, LAPAROSCOPIC, CHOLECYSTECTOMY, UMBILICAL HERNIA REPAIR    Anesthesia type: General ETT    Patient location:Phase I PACU    Last vitals:   Filed Vitals:    10/31/15 0925   BP: 131/59   Pulse: 93   Temp: 36.7 C (98.1 F)   Resp: 20   SpO2: 95%       Post pain: Patient not complaining of pain, continue current therapy      Mental Status:awake    Respiratory Function: tolerating nasal cannula    Cardiovascular: stable    Nausea/Vomiting: patient not complaining of nausea or vomiting    Hydration Status: adequate    Post assessment: no apparent anesthetic complications

## 2015-11-01 ENCOUNTER — Ambulatory Visit: Payer: Medicare PPO

## 2015-11-01 LAB — LAB USE ONLY - HISTORICAL SURGICAL PATHOLOGY

## 2015-11-19 NOTE — Op Note (Signed)
Procedure Date: 10/31/2015     Patient Type: A     SURGEON: Carma Lair MD  ASSISTANT:       PREOPERATIVE DIAGNOSES:  Chronic cholecystitis with cholelithiasis and umbilical hernia.     POSTOPERATIVE DIAGNOSES:  Chronic cholecystitis with cholelithiasis and umbilical hernia.     TITLE OF PROCEDURE:  Robotic cholecystectomy with Firefly visualization and umbilical hernia  repair.     ANESTHESIA:  General.     ESTIMATED BLOOD LOSS:  Minimal.     PATHOLOGY:  Gallbladder and contents.     COMPLICATIONS:  None.     CONDITION:  To recovery room in stable condition.     DESCRIPTION OF PROCEDURE:  The patient was brought to the operating room, identified at [facility], administration of anesthesia and intubation.  Patient's abdomen  was prepped and draped in normal sterile fashion.  A semicircular umbilical  incision was made with a 15 blade.  Subcutaneous tissue was dissected down  to the fascia in the site of the umbilical hernia.  The umbilical stalk was  circumferentially dissected around and removed at its base.  The hernia sac  was dissected off of the stalk.  The sac was opened and this hole was used  to place a 12-mm port under direct visualization.  The abdomen was  immediately insufflated.  A left mid, right mid, and right lateral robotic  port were all placed under visualization.  The patient was placed in  reverse Trendelenburg/left-side-down position to better expose the right  upper quadrant.  The patient had a very slightly over-distended gallbladder  with a moderate amount of omental adhesions.  There were no other gross  abnormalities seen in the abdomen.      The robot was docked, starting with the camera, followed the #1; 2 and 3  positions.  Gallbladder was grasped through the right most lateral port and  raised over liver's edge, exposing the body and infundibulum.  The  infundibulum was grasped, raised anteriorly and caudal, exposing the  hepaticocystic triangle.  Omental adhesions were  taken off with [place]  blunt dissection.  The gallbladder/cystic duct and cystic duct/common duct  junctions were identified.  The cystic duct was circumferentially dissected  around at the gallbladder/cystic duct junction.  This was all done with the  assistance of Firefly visualization.  The cystic duct was doubly clipped  inferiorly, singly clipped superiorly, and transected robotically.  Cystic  artery was identified, clipped, controlled, and transected in a similar  fashion.  The gallbladder was meticulously reduced off the fossa with hook  electrocautery.  After completely removing the gallbladder from the fossa,  fossa and clip sites were inspected with the assistance of Firefly,  remained intact.  This was also done during the desufflation trial, which  all structures again remained intact.  The abdomen was reinsufflated.  The  #3 port was undocked.  The gallbladder was grasped with an atraumatic  grasper.  Remainder of the ports were undocked under visualization.  The  gallbladder was placed in an Endobag and removed from the umbilical  position under visualization.  This port was replaced.  The abdomen was  inspected one more time.  Fossa and clip sites remained intact during the  desufflation.  Ports were removed at the end of desufflation process under  visualization.  Wounds were irrigated and injected with local anesthetic.   The umbilical fascia was closed with an interrupted Ethibond in a  tension-relieving Smead-Jones fashion.  The  wound was irrigated.  The  umbilical stalk was reapproximated with interrupted 3-0 Vicryl.  The  subcutaneous space was closed with interrupted 3-0 Vicryl sutures, and then  all wounds were closed subcuticularly with a 4-0 Monocryl stitch.  They  were steri-stripped and bandaged.  The patient was extubated, sent to  recovery room in stable condition with all counts being correct at the end  of the case.           D:  11/19/2015 12:09 PM by Dr. Rudy Jew. Germaine Pomfret, MD  ([id number])  T:  11/19/2015 13:14 PM by NTS      Everlean Cherry: [id number]) (Doc ID: [id number])

## 2015-11-20 ENCOUNTER — Other Ambulatory Visit: Payer: Self-pay

## 2015-11-21 ENCOUNTER — Ambulatory Visit: Payer: Medicare PPO

## 2016-02-25 ENCOUNTER — Other Ambulatory Visit: Payer: Self-pay | Admitting: Internal Medicine

## 2016-02-25 DIAGNOSIS — R918 Other nonspecific abnormal finding of lung field: Secondary | ICD-10-CM

## 2016-02-26 ENCOUNTER — Ambulatory Visit: Payer: No Typology Code available for payment source

## 2016-02-26 ENCOUNTER — Ambulatory Visit
Admission: RE | Admit: 2016-02-26 | Discharge: 2016-02-26 | Disposition: A | Discharge status: Home / Self Care | Payer: No Typology Code available for payment source | Source: Ambulatory Visit | Attending: Internal Medicine | Admitting: Internal Medicine

## 2016-02-26 DIAGNOSIS — R918 Other nonspecific abnormal finding of lung field: Secondary | ICD-10-CM | POA: Insufficient documentation

## 2016-03-10 ENCOUNTER — Other Ambulatory Visit: Payer: Self-pay | Admitting: Family Medicine

## 2016-03-10 ENCOUNTER — Ambulatory Visit
Admission: RE | Admit: 2016-03-10 | Discharge: 2016-03-10 | Disposition: A | Discharge status: Home / Self Care | Payer: No Typology Code available for payment source | Source: Ambulatory Visit | Attending: Family Medicine | Admitting: Family Medicine

## 2016-03-10 DIAGNOSIS — R109 Unspecified abdominal pain: Secondary | ICD-10-CM

## 2016-03-10 LAB — WHOLE BLOOD CREATININE WITH GFR POCT
GFR POCT: >60 mL/min/{1.73_m2} (ref >60)
Whole Blood Creatinine POCT: 0.8 mg/dL (ref 0.5–1.1)

## 2016-03-10 MED ORDER — IODIXANOL 320 MG/ML IV SOLN
100.0000 mL | Freq: Once | INTRAVENOUS | Status: AC | PRN
Start: 2016-03-10 — End: 2016-03-10
  Administered 2016-03-10: 100 mL via INTRAVENOUS

## 2016-03-24 ENCOUNTER — Other Ambulatory Visit: Payer: Self-pay

## 2016-03-25 ENCOUNTER — Emergency Department: Payer: No Typology Code available for payment source

## 2016-03-25 ENCOUNTER — Inpatient Hospital Stay
Admission: EM | Admit: 2016-03-25 | Discharge: 2016-04-01 | DRG: 871 | Disposition: A | Discharge status: Home Health | Payer: No Typology Code available for payment source | Attending: Internal Medicine | Admitting: Internal Medicine

## 2016-03-25 ENCOUNTER — Inpatient Hospital Stay: Payer: No Typology Code available for payment source | Admitting: Family Medicine

## 2016-03-25 DIAGNOSIS — R6520 Severe sepsis without septic shock: Secondary | ICD-10-CM | POA: Diagnosis present

## 2016-03-25 DIAGNOSIS — E1165 Type 2 diabetes mellitus with hyperglycemia: Secondary | ICD-10-CM | POA: Diagnosis present

## 2016-03-25 DIAGNOSIS — J9601 Acute respiratory failure with hypoxia: Secondary | ICD-10-CM | POA: Diagnosis present

## 2016-03-25 DIAGNOSIS — Z87891 Personal history of nicotine dependence: Secondary | ICD-10-CM

## 2016-03-25 DIAGNOSIS — F419 Anxiety disorder, unspecified: Secondary | ICD-10-CM | POA: Diagnosis present

## 2016-03-25 DIAGNOSIS — M1990 Unspecified osteoarthritis, unspecified site: Secondary | ICD-10-CM | POA: Diagnosis present

## 2016-03-25 DIAGNOSIS — R32 Unspecified urinary incontinence: Secondary | ICD-10-CM | POA: Diagnosis present

## 2016-03-25 DIAGNOSIS — I890 Lymphedema, not elsewhere classified: Secondary | ICD-10-CM | POA: Diagnosis present

## 2016-03-25 DIAGNOSIS — G43909 Migraine, unspecified, not intractable, without status migrainosus: Secondary | ICD-10-CM | POA: Diagnosis present

## 2016-03-25 DIAGNOSIS — H5442 Blindness, left eye, normal vision right eye: Secondary | ICD-10-CM | POA: Diagnosis present

## 2016-03-25 DIAGNOSIS — E872 Acidosis, unspecified: Secondary | ICD-10-CM | POA: Diagnosis present

## 2016-03-25 DIAGNOSIS — J189 Pneumonia, unspecified organism: Secondary | ICD-10-CM | POA: Diagnosis present

## 2016-03-25 DIAGNOSIS — A419 Sepsis, unspecified organism: Principal | ICD-10-CM | POA: Diagnosis present

## 2016-03-25 DIAGNOSIS — I10 Essential (primary) hypertension: Secondary | ICD-10-CM | POA: Diagnosis present

## 2016-03-25 DIAGNOSIS — E039 Hypothyroidism, unspecified: Secondary | ICD-10-CM | POA: Diagnosis present

## 2016-03-25 DIAGNOSIS — E785 Hyperlipidemia, unspecified: Secondary | ICD-10-CM | POA: Diagnosis present

## 2016-03-25 DIAGNOSIS — K449 Diaphragmatic hernia without obstruction or gangrene: Secondary | ICD-10-CM | POA: Diagnosis present

## 2016-03-25 DIAGNOSIS — Z7952 Long term (current) use of systemic steroids: Secondary | ICD-10-CM

## 2016-03-25 DIAGNOSIS — R4182 Altered mental status, unspecified: Secondary | ICD-10-CM

## 2016-03-25 DIAGNOSIS — J9809 Other diseases of bronchus, not elsewhere classified: Secondary | ICD-10-CM | POA: Diagnosis present

## 2016-03-25 DIAGNOSIS — J441 Chronic obstructive pulmonary disease with (acute) exacerbation: Secondary | ICD-10-CM | POA: Diagnosis present

## 2016-03-25 DIAGNOSIS — K219 Gastro-esophageal reflux disease without esophagitis: Secondary | ICD-10-CM | POA: Diagnosis present

## 2016-03-25 DIAGNOSIS — J206 Acute bronchitis due to rhinovirus: Secondary | ICD-10-CM | POA: Diagnosis present

## 2016-03-25 DIAGNOSIS — T380X5A Adverse effect of glucocorticoids and synthetic analogues, initial encounter: Secondary | ICD-10-CM | POA: Diagnosis not present

## 2016-03-25 DIAGNOSIS — G4733 Obstructive sleep apnea (adult) (pediatric): Secondary | ICD-10-CM | POA: Diagnosis present

## 2016-03-25 DIAGNOSIS — Z794 Long term (current) use of insulin: Secondary | ICD-10-CM

## 2016-03-25 DIAGNOSIS — J45909 Unspecified asthma, uncomplicated: Secondary | ICD-10-CM | POA: Diagnosis present

## 2016-03-25 DIAGNOSIS — M353 Polymyalgia rheumatica: Secondary | ICD-10-CM | POA: Diagnosis present

## 2016-03-25 DIAGNOSIS — I872 Venous insufficiency (chronic) (peripheral): Secondary | ICD-10-CM | POA: Diagnosis present

## 2016-03-25 DIAGNOSIS — R9431 Abnormal electrocardiogram [ECG] [EKG]: Secondary | ICD-10-CM | POA: Diagnosis present

## 2016-03-25 DIAGNOSIS — J440 Chronic obstructive pulmonary disease with acute lower respiratory infection: Secondary | ICD-10-CM | POA: Diagnosis present

## 2016-03-25 LAB — CBC AND DIFFERENTIAL
Basophils Absolute Automated: 0.07 10*3/uL (ref 0.00–0.20)
Basophils Automated: 1 %
Eosinophils Absolute Automated: 0.21 10*3/uL (ref 0.00–0.70)
Eosinophils Automated: 2 %
Hematocrit: 41.8 % (ref 37.0–47.0)
Hgb: 13.3 g/dL (ref 12.0–16.0)
Immature Granulocytes Absolute: 0.04 10*3/uL
Immature Granulocytes: 0 %
Lymphocytes Absolute Automated: 0.86 10*3/uL (ref 0.50–4.40)
Lymphocytes Automated: 8 %
MCH: 29.2 pg (ref 28.0–32.0)
MCHC: 31.8 g/dL — ABNORMAL LOW (ref 32.0–36.0)
MCV: 91.7 fL (ref 80.0–100.0)
MPV: 9.2 fL — ABNORMAL LOW (ref 9.4–12.3)
Monocytes Absolute Automated: 0.73 10*3/uL (ref 0.00–1.20)
Monocytes: 7 %
Neutrophils Absolute: 8.41 10*3/uL — ABNORMAL HIGH (ref 1.80–8.10)
Neutrophils: 82 %
Platelets: 247 10*3/uL (ref 140–400)
RBC: 4.56 10*6/uL (ref 4.20–5.40)
RDW: 15 % (ref 12–15)
WBC: 10.28 10*3/uL (ref 3.50–10.80)

## 2016-03-25 LAB — LACTIC ACID, PLASMA
Lactic Acid: 2.4 mmol/L — ABNORMAL HIGH (ref 0.2–2.0)
Lactic Acid: 1.2 mmol/L (ref 0.2–2.0)

## 2016-03-25 LAB — COMPREHENSIVE METABOLIC PANEL
ALT: 12 U/L (ref 0–55)
AST (SGOT): 15 U/L (ref 5–34)
Albumin/Globulin Ratio: 1.2 (ref 0.9–2.2)
Albumin: 3.7 g/dL (ref 3.5–5.0)
Alkaline Phosphatase: 62 U/L (ref 37–106)
Anion Gap: 13 (ref 5.0–15.0)
BUN: 11.5 mg/dL (ref 7.0–19.0)
Bilirubin, Total: 0.4 mg/dL (ref 0.2–1.2)
CO2: 30 mEq/L — ABNORMAL HIGH (ref 22–29)
Calcium: 9.9 mg/dL (ref 7.9–10.2)
Chloride: 96 mEq/L — ABNORMAL LOW (ref 100–111)
Creatinine: 0.9 mg/dL (ref 0.6–1.0)
Globulin: 3.1 g/dL (ref 2.0–3.6)
Glucose: 156 mg/dL — ABNORMAL HIGH (ref 70–100)
Potassium: 3.6 mEq/L (ref 3.5–5.1)
Protein, Total: 6.8 g/dL (ref 6.0–8.3)
Sodium: 139 mEq/L (ref 136–145)

## 2016-03-25 LAB — URINALYSIS, REFLEX TO MICROSCOPIC EXAM IF INDICATED
Bilirubin, UA: NEGATIVE
Blood, UA: NEGATIVE
Glucose, UA: NEGATIVE
Ketones UA: NEGATIVE
Leukocyte Esterase, UA: NEGATIVE
Nitrite, UA: NEGATIVE
Protein, UR: NEGATIVE
Specific Gravity UA: 1.01 (ref 1.001–1.035)
Urine pH: 6 (ref 5.0–8.0)
Urobilinogen, UA: NORMAL mg/dL

## 2016-03-25 LAB — GFR: EGFR: >60

## 2016-03-25 LAB — MAGNESIUM: Magnesium: 1.5 mg/dL — ABNORMAL LOW (ref 1.6–2.6)

## 2016-03-25 LAB — TROPONIN I
Troponin I: 0.02 ng/mL (ref 0.00–0.09)
Troponin I: <0.01 ng/mL (ref 0.00–0.09)

## 2016-03-25 LAB — PT/INR
PT INR: 0.9
PT: 12.5 s — ABNORMAL LOW (ref 12.6–15.0)

## 2016-03-25 LAB — TSH: TSH: 4.45 u[IU]/mL (ref 0.35–4.94)

## 2016-03-25 LAB — APTT: PTT: 31 s (ref 23–37)

## 2016-03-25 LAB — IHS D-DIMER: D-Dimer: 0.35 ug/mL FEU (ref 0.00–0.51)

## 2016-03-25 LAB — GROUP A STREP, RAPID ANTIGEN: Group A Strep, Rapid Antigen: NEGATIVE

## 2016-03-25 LAB — B-TYPE NATRIURETIC PEPTIDE: B-Natriuretic Peptide: 100.7 pg/mL — ABNORMAL HIGH (ref 0.0–100.0)

## 2016-03-25 MED ORDER — ONDANSETRON HCL 4 MG/2ML IJ SOLN
4.0000 mg | Freq: Four times a day (QID) | INTRAMUSCULAR | Status: DC | PRN
Start: 2016-03-25 — End: 2016-04-01

## 2016-03-25 MED ORDER — TRAZODONE HCL 100 MG PO TABS
100.0000 mg | ORAL_TABLET | Freq: Every evening | ORAL | Status: DC
Start: 2016-03-25 — End: 2016-04-01
  Administered 2016-03-28 – 2016-03-31 (×3): 100 mg via ORAL
  Filled 2016-03-25 (×5): qty 1

## 2016-03-25 MED ORDER — NITROGLYCERIN 0.4 MG SL SUBL
0.4000 mg | SUBLINGUAL_TABLET | SUBLINGUAL | Status: DC | PRN
Start: 2016-03-25 — End: 2016-04-01

## 2016-03-25 MED ORDER — GLUCAGON 1 MG IJ SOLR (WRAP)
1.0000 mg | INTRAMUSCULAR | Status: DC | PRN
Start: 2016-03-25 — End: 2016-04-01

## 2016-03-25 MED ORDER — INSULIN LISPRO 100 UNIT/ML SC SOLN
1.0000 [IU] | Freq: Every evening | SUBCUTANEOUS | Status: DC | PRN
Start: 2016-03-25 — End: 2016-04-01
  Administered 2016-03-26 – 2016-03-27 (×2): 2 [IU] via SUBCUTANEOUS
  Administered 2016-03-28 – 2016-03-29 (×2): 4 [IU] via SUBCUTANEOUS
  Administered 2016-03-30: 5 [IU] via SUBCUTANEOUS
  Filled 2016-03-25: qty 6
  Filled 2016-03-25: qty 15
  Filled 2016-03-25: qty 12
  Filled 2016-03-25: qty 6
  Filled 2016-03-25 (×3): qty 12

## 2016-03-25 MED ORDER — HYDROCHLOROTHIAZIDE 25 MG PO TABS
25.0000 mg | ORAL_TABLET | Freq: Every morning | ORAL | Status: DC
Start: 2016-03-26 — End: 2016-04-01
  Administered 2016-03-28 – 2016-04-01 (×5): 25 mg via ORAL
  Filled 2016-03-25 (×6): qty 1

## 2016-03-25 MED ORDER — SODIUM CHLORIDE 0.9 % IV SOLN
INTRAVENOUS | Status: DC
Start: 2016-03-25 — End: 2016-03-25

## 2016-03-25 MED ORDER — PANTOPRAZOLE SODIUM 40 MG PO TBEC
40.0000 mg | DELAYED_RELEASE_TABLET | Freq: Every day | ORAL | Status: DC
Start: 2016-03-25 — End: 2016-04-01
  Administered 2016-03-25 – 2016-04-01 (×8): 40 mg via ORAL
  Filled 2016-03-25 (×8): qty 1

## 2016-03-25 MED ORDER — DILTIAZEM HCL 60 MG PO TABS
60.0000 mg | ORAL_TABLET | Freq: Four times a day (QID) | ORAL | Status: DC
Start: 2016-03-26 — End: 2016-03-26
  Administered 2016-03-26 (×2): 60 mg via ORAL
  Filled 2016-03-25 (×2): qty 1

## 2016-03-25 MED ORDER — NALOXONE HCL 0.4 MG/ML IJ SOLN (WRAP)
0.2000 mg | INTRAMUSCULAR | Status: DC | PRN
Start: 2016-03-25 — End: 2016-04-01

## 2016-03-25 MED ORDER — DULOXETINE HCL 60 MG PO CPEP
60.0000 mg | ORAL_CAPSULE | Freq: Two times a day (BID) | ORAL | Status: DC
Start: 2016-03-25 — End: 2016-04-01
  Administered 2016-03-25 – 2016-04-01 (×14): 60 mg via ORAL
  Filled 2016-03-25 (×15): qty 1

## 2016-03-25 MED ORDER — MAGNESIUM SULFATE IN D5W 1-5 GM/100ML-% IV SOLN
1.0000 g | INTRAVENOUS | Status: AC
Start: 2016-03-25 — End: 2016-03-25
  Administered 2016-03-25 (×2): 1 g via INTRAVENOUS
  Filled 2016-03-25 (×2): qty 100

## 2016-03-25 MED ORDER — INSULIN LISPRO 100 UNIT/ML SC SOLN
2.0000 [IU] | Freq: Three times a day (TID) | SUBCUTANEOUS | Status: DC | PRN
Start: 2016-03-26 — End: 2016-04-01
  Administered 2016-03-26: 18 [IU] via SUBCUTANEOUS
  Administered 2016-03-26: 10 [IU] via SUBCUTANEOUS
  Administered 2016-03-27: 8 [IU] via SUBCUTANEOUS
  Administered 2016-03-27 (×2): 6 [IU] via SUBCUTANEOUS
  Administered 2016-03-28: 2 [IU] via SUBCUTANEOUS
  Administered 2016-03-28: 4 [IU] via SUBCUTANEOUS
  Administered 2016-03-28: 8 [IU] via SUBCUTANEOUS
  Administered 2016-03-29 (×2): 6 [IU] via SUBCUTANEOUS
  Administered 2016-03-30 – 2016-03-31 (×3): 2 [IU] via SUBCUTANEOUS
  Administered 2016-03-31: 8 [IU] via SUBCUTANEOUS
  Filled 2016-03-25: qty 18
  Filled 2016-03-25: qty 24
  Filled 2016-03-25: qty 6
  Filled 2016-03-25: qty 18
  Filled 2016-03-25: qty 6
  Filled 2016-03-25: qty 12
  Filled 2016-03-25: qty 18
  Filled 2016-03-25: qty 6
  Filled 2016-03-25 (×2): qty 24
  Filled 2016-03-25: qty 30
  Filled 2016-03-25: qty 54
  Filled 2016-03-25: qty 6
  Filled 2016-03-25: qty 18

## 2016-03-25 MED ORDER — LOSARTAN POTASSIUM 100 MG PO TABS
100.0000 mg | ORAL_TABLET | Freq: Every morning | ORAL | Status: DC
Start: 2016-03-26 — End: 2016-04-01
  Administered 2016-03-26 – 2016-04-01 (×7): 100 mg via ORAL
  Filled 2016-03-25 (×8): qty 1

## 2016-03-25 MED ORDER — GUAIFENESIN ER 600 MG PO TB12
600.0000 mg | ORAL_TABLET | Freq: Two times a day (BID) | ORAL | Status: DC
Start: 2016-03-25 — End: 2016-04-01
  Administered 2016-03-25 – 2016-04-01 (×14): 600 mg via ORAL
  Filled 2016-03-25 (×15): qty 1

## 2016-03-25 MED ORDER — LACTULOSE 10 GM/15ML PO SOLN
10.0000 g | Freq: Every day | ORAL | Status: DC | PRN
Start: 2016-03-25 — End: 2016-04-01

## 2016-03-25 MED ORDER — METHYLPREDNISOLONE SODIUM SUCC 40 MG IJ SOLR
40.0000 mg | Freq: Three times a day (TID) | INTRAMUSCULAR | Status: DC
Start: 2016-03-25 — End: 2016-03-27
  Administered 2016-03-25 – 2016-03-27 (×5): 40 mg via INTRAVENOUS
  Filled 2016-03-25 (×6): qty 1

## 2016-03-25 MED ORDER — SODIUM CHLORIDE 0.9 % IV MBP
1.0000 g | INTRAVENOUS | Status: DC
Start: 2016-03-26 — End: 2016-04-01
  Administered 2016-03-26 – 2016-03-31 (×6): 1 g via INTRAVENOUS
  Filled 2016-03-25 (×7): qty 1000

## 2016-03-25 MED ORDER — FAMOTIDINE 10 MG/ML IV SOLN (WRAP)
20.0000 mg | Freq: Every day | INTRAVENOUS | Status: DC
Start: 2016-03-25 — End: 2016-03-26

## 2016-03-25 MED ORDER — LOSARTAN POTASSIUM 100 MG PO TABS
1.0000 | ORAL_TABLET | Freq: Every morning | ORAL | Status: DC
Start: 2016-03-26 — End: 2016-03-25

## 2016-03-25 MED ORDER — ENOXAPARIN SODIUM 40 MG/0.4ML SC SOLN
40.0000 mg | Freq: Every day | SUBCUTANEOUS | Status: DC
Start: 2016-03-25 — End: 2016-04-01
  Administered 2016-03-25 – 2016-03-31 (×7): 40 mg via SUBCUTANEOUS
  Filled 2016-03-25 (×7): qty 0.4

## 2016-03-25 MED ORDER — ACETAMINOPHEN 650 MG RE SUPP
650.0000 mg | RECTAL | Status: DC | PRN
Start: 2016-03-25 — End: 2016-04-01

## 2016-03-25 MED ORDER — METOPROLOL TARTRATE 5 MG/5ML IV SOLN
5.0000 mg | Freq: Four times a day (QID) | INTRAVENOUS | Status: DC | PRN
Start: 2016-03-25 — End: 2016-04-01

## 2016-03-25 MED ORDER — FOLIC ACID 1 MG PO TABS
1.0000 mg | ORAL_TABLET | Freq: Every day | ORAL | Status: DC
Start: 2016-03-26 — End: 2016-04-01
  Administered 2016-03-26 – 2016-04-01 (×7): 1 mg via ORAL
  Filled 2016-03-25 (×7): qty 1

## 2016-03-25 MED ORDER — AZITHROMYCIN 250 MG PO TABS
500.0000 mg | ORAL_TABLET | Freq: Every day | ORAL | Status: DC
Start: 2016-03-25 — End: 2016-03-25
  Administered 2016-03-25: 500 mg via ORAL
  Filled 2016-03-25: qty 2

## 2016-03-25 MED ORDER — SODIUM CHLORIDE 0.9 % IV BOLUS
30.0000 mL/kg | Freq: Once | INTRAVENOUS | Status: AC
Start: 2016-03-25 — End: 2016-03-25
  Administered 2016-03-25: 2382 mL via INTRAVENOUS

## 2016-03-25 MED ORDER — ALBUTEROL-IPRATROPIUM 2.5-0.5 (3) MG/3ML IN SOLN
3.0000 mL | Freq: Four times a day (QID) | RESPIRATORY_TRACT | Status: DC
Start: 2016-03-25 — End: 2016-03-26
  Administered 2016-03-25: 3 mL via RESPIRATORY_TRACT
  Filled 2016-03-25 (×2): qty 3

## 2016-03-25 MED ORDER — ACETAMINOPHEN 325 MG PO TABS
650.0000 mg | ORAL_TABLET | ORAL | Status: DC | PRN
Start: 2016-03-25 — End: 2016-04-01
  Administered 2016-03-25: 650 mg via ORAL
  Filled 2016-03-25: qty 2

## 2016-03-25 MED ORDER — GLUCOSE 40 % PO GEL
15.0000 g | ORAL | Status: DC | PRN
Start: 2016-03-25 — End: 2016-04-01

## 2016-03-25 MED ORDER — LEVOTHYROXINE SODIUM 50 MCG PO TABS
50.0000 ug | ORAL_TABLET | Freq: Every day | ORAL | Status: DC
Start: 2016-03-26 — End: 2016-04-01
  Administered 2016-03-26 – 2016-04-01 (×7): 50 ug via ORAL
  Filled 2016-03-25 (×7): qty 1

## 2016-03-25 MED ORDER — POLYETHYLENE GLYCOL 3350 17 G PO PACK
17.0000 g | PACK | Freq: Every day | ORAL | Status: DC | PRN
Start: 2016-03-25 — End: 2016-04-01

## 2016-03-25 MED ORDER — FLUTICASONE FUROATE-VILANTEROL 100-25 MCG/INH IN AEPB
1.0000 | INHALATION_SPRAY | Freq: Every morning | RESPIRATORY_TRACT | Status: DC
Start: 2016-03-26 — End: 2016-04-01
  Administered 2016-03-26 – 2016-04-01 (×7): 1 via RESPIRATORY_TRACT
  Filled 2016-03-25: qty 14

## 2016-03-25 MED ORDER — DOCUSATE SODIUM 100 MG PO CAPS
100.0000 mg | ORAL_CAPSULE | Freq: Two times a day (BID) | ORAL | Status: DC
Start: 2016-03-25 — End: 2016-04-01
  Administered 2016-03-25 – 2016-04-01 (×14): 100 mg via ORAL
  Filled 2016-03-25 (×14): qty 1

## 2016-03-25 MED ORDER — SODIUM CHLORIDE 0.9 % IV SOLN
500.0000 mg | INTRAVENOUS | Status: DC
Start: 2016-03-26 — End: 2016-03-29
  Administered 2016-03-26 – 2016-03-29 (×4): 500 mg via INTRAVENOUS
  Filled 2016-03-25 (×4): qty 500

## 2016-03-25 MED ORDER — TRAMADOL HCL 50 MG PO TABS
50.0000 mg | ORAL_TABLET | Freq: Two times a day (BID) | ORAL | Status: DC | PRN
Start: 2016-03-25 — End: 2016-04-01
  Administered 2016-03-25 – 2016-03-31 (×7): 50 mg via ORAL
  Filled 2016-03-25 (×8): qty 1

## 2016-03-25 MED ORDER — RISAQUAD PO CAPS
1.0000 | ORAL_CAPSULE | Freq: Every day | ORAL | Status: DC
Start: 2016-03-25 — End: 2016-04-01
  Administered 2016-03-26 – 2016-04-01 (×8): 1 via ORAL
  Filled 2016-03-25 (×8): qty 1

## 2016-03-25 MED ORDER — GABAPENTIN 300 MG PO CAPS
300.0000 mg | ORAL_CAPSULE | Freq: Two times a day (BID) | ORAL | Status: DC
Start: 2016-03-25 — End: 2016-04-01
  Administered 2016-03-25 – 2016-04-01 (×14): 300 mg via ORAL
  Filled 2016-03-25 (×16): qty 1

## 2016-03-25 MED ORDER — ACETAMINOPHEN 325 MG PO TABS
650.0000 mg | ORAL_TABLET | ORAL | Status: DC | PRN
Start: 2016-03-25 — End: 2016-03-25

## 2016-03-25 MED ORDER — DEXTROSE 10 % IV BOLUS
125.0000 mL | INTRAVENOUS | Status: DC | PRN
Start: 2016-03-25 — End: 2016-04-01

## 2016-03-25 MED ORDER — SODIUM CHLORIDE 0.9 % IV MBP
1.0000 g | Freq: Once | INTRAVENOUS | Status: AC
Start: 2016-03-25 — End: 2016-03-25
  Administered 2016-03-25: 1 g via INTRAVENOUS
  Filled 2016-03-25: qty 1000
  Filled 2016-03-25: qty 100

## 2016-03-25 MED ORDER — FUROSEMIDE 10 MG/ML IJ SOLN
20.0000 mg | Freq: Once | INTRAMUSCULAR | Status: AC
Start: 2016-03-25 — End: 2016-03-25
  Administered 2016-03-25: 20 mg via INTRAVENOUS
  Filled 2016-03-25: qty 2

## 2016-03-25 MED ORDER — ENOXAPARIN SODIUM 40 MG/0.4ML SC SOLN
40.0000 mg | Freq: Every day | SUBCUTANEOUS | Status: DC
Start: 2016-03-25 — End: 2016-03-25

## 2016-03-25 MED ORDER — ALPRAZOLAM 0.25 MG PO TABS
0.2500 mg | ORAL_TABLET | Freq: Three times a day (TID) | ORAL | Status: DC | PRN
Start: 2016-03-25 — End: 2016-04-01
  Administered 2016-03-25 – 2016-03-31 (×8): 0.25 mg via ORAL
  Filled 2016-03-25 (×8): qty 1

## 2016-03-25 MED ORDER — FAMOTIDINE 20 MG PO TABS
20.0000 mg | ORAL_TABLET | Freq: Every day | ORAL | Status: DC
Start: 2016-03-25 — End: 2016-04-01
  Administered 2016-03-25 – 2016-03-31 (×7): 20 mg via ORAL
  Filled 2016-03-25 (×7): qty 1

## 2016-03-25 MED ORDER — INSULIN GLARGINE 100 UNIT/ML SC SOLN
7.0000 [IU] | Freq: Every evening | SUBCUTANEOUS | Status: DC
Start: 2016-03-25 — End: 2016-03-26
  Administered 2016-03-25: 7 [IU] via SUBCUTANEOUS
  Filled 2016-03-25: qty 7

## 2016-03-25 NOTE — ED Notes (Signed)
Denies weapons on person

## 2016-03-25 NOTE — ED Notes (Signed)
Spoke with hospitalist Jessie Foot NP. Instructed to [place] stroke protocol.

## 2016-03-25 NOTE — Progress Notes (Signed)
D-dimer still pending  If positive, we would proceed with CT angiogram of the chest  This has been discussed with patient and her daughter at bedside.  They understand the possible risk of contrast nephropathy and agrees with proceeding with CT angiogram of the chest as needed

## 2016-03-25 NOTE — H&P (Signed)
Rachael [name] [phone]  Outpatient Primary MD for the patient is Jocelyn Lamer, DO    Assessment & Plan    ?    Acute hypoxic respiratory failure secondary to chronic obstructive pulmonary disease exacerbation, most likely secondary to community acquired pneumonia  Lactic acidosis : Resolved with IV fluids  Altered mental status secondary to above: Resolved  Intermittent chest pain with Abnormal EKG  Polymyalgia rheumatica  Diabetes mellitus 2  Hypertension  Hypothyroidism  Gastroesophageal reflux disease  Obstructive sleep apnea  Plan  Admit to inpatient  Panculture.  Check d-dimer stat ; check influenza   Strep throat culture pending   Start empirically on Rocephin and azithromycin IV steroids and around-the-clock nebulizers  Monitor on telemetry with serial troponin.  Consult cardiology and pulmonology  Start low-dose Lantus as her blood sugars are anticipated with IV steroids; check hemoglobin A1c with insulin sliding scale  Patient states that her methotrexate has been discontinued a few weeks ago by her rheumatologist  We will switch Cardizem to short acting and IV metoprolol with  holding parameters  Patient had a stress test with Anis last year and had an echo done last month  Check thyroid-stimulating hormone   Resume CPAP at night     AM Labs Ordered, also please review Full Orders  Admission, patients condition and plan of care including tests being ordered have been discussed with the patient and daughter who indicate understanding and agree with the plan and Code Status.  Condition GUARDED  DVT Prohylaxis Lovenox  Code Status: Full code   Type of Admission: Inpatient  Estimated Length of Stay (including stay in the ER receiving treatment): Greater than 2 midnights  Medical Necessity for stay: Acute respiratory failure with hypoxia and mental status, pneumonia, chronic obstructive pulmonary disease exacerbation, chest pain, abnormal EKG      With History of -  Past Medical  History   Diagnosis Date   . Hypertension    . Chronic obstructive pulmonary disease    . Arthritis    . Polymyalgia rheumatica 09/2014   . Venous insufficiency of right leg 2016     also has lymphedema-KEEPS HER LEGS WRAPPED EXCEPT FOR WHEN SHE BATHES   . Lymphedema of right lower extremity    . Type II or unspecified type diabetes mellitus without mention of complication, not stated as uncontrolled    . Gastroesophageal reflux disease    . Hypothyroidism    . Hyperlipidemia    . Type 2 diabetes mellitus, controlled    . Sleep apnea      USES CPAP NIGHTLY   . Hiatal hernia    . Low back pain    . 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 Anxiety    . Incontinent of urine 2014     -WEARS DIAPERS-WILL BRING HER OWN      Past Surgical History   Procedure Laterality Date   . Back surgery  2014   . Carpal tunnel release Bilateral 1978   . Hysterectomy  1985   . Robot assisted, laparoscopic, cholecystectomy N/A 10/31/2015     Procedure: ROBOT ASSISTED, LAPAROSCOPIC, CHOLECYSTECTOMY, UMBILICAL HERNIA REPAIR;  Surgeon: Alen Blew, MD;  Location: [facility];  Service: General;  Laterality: N/A;     in for   Chief Complaint   Patient presents with   . Altered Mental Status   . Shortness of Breath      HPI  Rachael [name] is a 80 y.o. female  with extensive past medical history as listed above.  She just moved in this area from [place] and her primary cardiologist is Dr. Alanson Aly primary pulmonologist  Dr Dayton Scrape and rheumatologist is Dr.Fong Cyndie Chime.  She states that her grandson was struggling with viral illness and she started feeling poorly yesterday, was seen at primary care physician's office and started on antibiotic.  She states that she continued to feel poorly with low-grade fever and worsening shortness of breath and this morning her creatinine was noted that she was confused and therefore brought to the emergency room.  In the ER, she was noted to be hypoxic in acute  respiratory distress ,significantly wheezing with low-grade fever and subsequently admitted  She complains of intermittent chest pain since yesterday with some sore throat.  Currently she is chest pain-free      Review of Systems   In addition to the HPI above  No Headache, No changes with Vision or hearing,  No problems swallowing food or Liquids,  No Abdominal pain, No Nausea or Vommitting, Bowel movements are regular,  No Blood in stool or Urine,  No dysuria,  No new skin rashes or bruises,  No new joints pains-aches,   No new weakness, tingling, numbness in any extremity,  No recent weight gain or loss,  No polyuria, polydypsia or polyphagia,  No significant Mental Stressors.  A full 10 point Review of Systems was done, except as stated above, all other Review of Systems were negative.  ?  Social History  Social History   Substance Use Topics   . Smoking status: Former Smoker -- 1.00 packs/day for 25 years     Quit date: 11/16/1998   . Smokeless tobacco: Never Used   . Alcohol Use: 1.8 oz/week     3 Glasses of wine per week      Comment: social       Family History  Negative for lung cancer or coronary artery disease    Prior to Admission medications    Medication Sig Start Date End Date Taking? Authorizing Provider   albuterol (PROVENTIL) (2.5 MG/3ML) 0.083% nebulizer solution TAKE (2.5MG ) BY NEBULIZATION EVERY 4 HOURS AS NEEDED FOR WHEEZING OR SHORTNESS OF BREATH (COUGH) 08/03/15   [provider]   ALPRAZolam (XANAX) 0.5 MG tablet TAKE 1 TABLET BY MOUTH EVERY OTHER DAY AS NEEDED 09/19/15   [provider]   Calcium Carbonate-Vit D-Min (CALCIUM 1200 PO) Take by mouth.    [provider]   diltiazem (CARDIZEM CD) 240 MG 24 hr capsule Take 240 mg by mouth daily.    [provider]   DULoxetine (CYMBALTA) 60 MG capsule Take 60 mg by mouth 2 (two) times daily.    [provider]   folic acid (FOLVITE) 1 MG tablet  09/03/15   [provider]   gabapentin  (NEURONTIN) 100 MG capsule 2 (two) times daily.    10/01/15   [provider]   hydrochlorothiazide (HYDRODIURIL) 25 MG tablet as needed.    07/30/15   [provider]   levothyroxine (SYNTHROID, LEVOTHROID) 50 MCG tablet Take 50 mcg by mouth Once a day at 6:00am.    [provider]   losartan-hydrochlorothiazide (HYZAAR) 100-25 MG per tablet Take 1 tablet by mouth every morning.    [provider]   metFORMIN (GLUCOPHAGE-XR) 500 MG 24 hr tablet nightly.    09/06/15   [provider]   methotrexate  2.5 MG tablet Take 10 mg by mouth once a week.       [provider]   metoprolol XL (TOPROL-XL) 25 MG 24 hr tablet 2 (two) times daily.    07/17/15   [provider]   nitrofurantoin, macrocrystal-monohydrate, (MACROBID) 100 MG capsule TAKE 1 CAPSULE(S) EVERY 12 HOURS BY ORAL ROUTE. 09/10/15   [provider]   pantoprazole (PROTONIX) 40 MG tablet  08/06/15   [provider]   potassium chloride (K-TAB,KLOR-CON) 10 MEQ tablet  07/17/15   [provider]   predniSONE (DELTASONE) 5 MG tablet Take 4 mg by mouth daily.       [provider]   [place] 18 MCG inhalation capsule  07/18/15   [provider]   [facility] 160-4.5 MCG/ACT inhaler  07/24/15   [provider]   traMADol (ULTRAM) 50 MG tablet TAKE 1 TABLET BY MOUTH TWICE DAILY AS NEEDED FOR PAIN 09/12/15   [provider]   traZODone (DESYREL) 100 MG tablet Take 100 mg by mouth nightly.    [provider]   ZETIA 10 MG tablet  09/06/15   [provider]     Allergies   Allergen Reactions   . Tolectin [Tolmetin] Swelling   . Latex Itching     Physical Exam  Vitals  Blood pressure 135/63, pulse 112, temperature 100.3 F (37.9 C), temperature source Temporal Artery, resp. rate 18, height 1.524 m (5'), weight 79.379 kg (175 lb), SpO2 89 %.  ?  1. General :lying in bed in mild respiratory distress  2. Normal affect and insight,  Not Suicidal or Homicidal, Awake Alert, Oriented *3.  3. No F.N deficits, ALL C.Nerves Intact, Strength 5/5 all 4 extremities, Sensation intact all 4 extremities, Plantars down going.  4. Ears and Eyes appear Normal, Conjunctivae clear, PERRLA. Moist Oral Mucosa.  5. Supple Neck, No JVD, No cervical lymphadenopathy appriciated, No Carotid Bruits.  6. Symmetrical Chest wall movement, poor air movement bilaterally, diffuse bilateral expiratory rhonchi with bibasilar crackles, left more than right  7. RRR, No Gallops, Rubs or Murmurs, No Parasternal Heave.  8. Positive Bowel Sounds, Abdomen Soft, Non tender, No organomegaly appriciated,   No rebound -guarding or rigidity.  9. No Cyanosis, Normal Skin Turgor, No Skin Rash or Bruise.  10. Good muscle tone, joints appear normal , no effusions, Normal ROM.  11. No Palpable Lymph Nodes in Neck or Axillae  12.  1+ pitting edema bilateral lower extremities  Data Review  Labs:reviewed     Results for orders placed or performed during the hospital encounter of 03/25/16   Rapid Strep (Group A Antigen)   Result Value Ref Range    Group A Strep, Rapid Antigen Negative Negative   CBC with differential   Result Value Ref Range    WBC 10.28 3.50 - 10.80 x10 3/uL    Hgb 13.3 12.0 - 16.0 g/dL    Hematocrit 96.0 45.4 - 47.0 %    Platelets 247 140 - 400 x10 3/uL    RBC 4.56 4.20 - 5.40 x10 6/uL    MCV 91.7 80.0 - 100.0 fL    MCH 29.2 28.0 - 32.0 pg    MCHC 31.8 (L) 32.0 - 36.0 g/dL    RDW 15 12 - 15 %    MPV 9.2 (L) 9.4 - 12.3 fL    Neutrophils 82 None %    Lymphocytes Automated 8 None %    Monocytes 7 None %  Eosinophils Automated 2 None %    Basophils Automated 1 None %    Immature Granulocyte 0 None %    Neutrophils Absolute 8.41 (H) 1.80 - 8.10 x10 3/uL    Abs Lymph Automated 0.86 0.50 - 4.40 x10 3/uL    Abs Mono Automated 0.73 0.00 - 1.20 x10 3/uL    Abs Eos Automated 0.21 0.00 - 0.70 x10 3/uL    Absolute Baso Automated 0.07 0.00 - 0.20 x10 3/uL    Absolute Immature Granulocyte  0.04 0 x10 3/uL   Comprehensive metabolic panel   Result Value Ref Range    Glucose 156 (H) 70 - 100 mg/dL    BUN 54.0 7.0 - 98.1 mg/dL    Creatinine 0.9 0.6 - 1.0 mg/dL    Sodium 191 478 - 295 mEq/L    Potassium 3.6 3.5 - 5.1 mEq/L    Chloride 96 (L) 100 - 111 mEq/L    CO2 30 (H) 22 - 29 mEq/L    Calcium 9.9 7.9 - 10.2 mg/dL    Protein, Total 6.8 6.0 - 8.3 g/dL    Albumin 3.7 3.5 - 5.0 g/dL    AST (SGOT) 15 5 - 34 U/L    ALT 12 0 - 55 U/L    Alkaline Phosphatase 62 37 - 106 U/L    Bilirubin, Total 0.4 0.2 - 1.2 mg/dL    Globulin 3.1 2.0 - 3.6 g/dL    Albumin/Globulin Ratio 1.2 0.9 - 2.2    Anion Gap 13.0 5.0 - 15.0   Lactic acid, plasma - sepsis initial specimen followed by a second specimen if initial result is greater than 2.0 mEq/L   Result Value Ref Range    Lactic acid 2.4 (H) 0.2 - 2.0 mmol/L   Lactic acid, plasma - sepsis initial specimen followed by a second specimen if initial result is greater than 2.0 mEq/L   Result Value Ref Range    Lactic acid 1.2 0.2 - 2.0 mmol/L   Protime-INR   Result Value Ref Range    PT 12.5 (L) 12.6 - 15.0 sec    PT INR 0.9 None Estab.    PT Anticoag. Given Within 48 hrs. None    APTT   Result Value Ref Range    PTT 31 23 - 37 sec   Magnesium   Result Value Ref Range    Magnesium 1.5 (L) 1.6 - 2.6 mg/dL   Troponin I   Result Value Ref Range    Troponin I 0.02 0.00 - 0.09 ng/mL   B-type Natriuretic Peptide   Result Value Ref Range    B-Natriuretic Peptide 100.7 (H) 0.0 - 100.0 pg/mL   GFR   Result Value Ref Range    EGFR >60.0    TSH   Result Value Ref Range    Thyroid Stimulating Hormone 4.45 0.35 - 4.94 uIU/mL   ECG 12 lead   Result Value Ref Range    Ventricular Rate 113 BPM    Atrial Rate 119 BPM    P-R Interval  ms    QRS Duration 62 ms    Q-T Interval 460 ms    QTC Calculation (Bezet) 630 ms    P Axis  degrees    R Axis 36 degrees    T Axis 96 degrees         Rads:reviewed     Radiology Results (24 Hour)     Procedure Component Value Units Date/Time    Chest AP Portable  [  [id number]] Collected:  03/25/16 1409    Order Status:  Completed Updated:  03/25/16 1414    Narrative:      HISTORY: Sepsis    COMPARISON: 08/02/2015    FINDINGS: Single portable view of the chest was obtained. The  cardiomediastinal silhouette appears stable. There is aortic  atherosclerotic disease. There is mild left basilar airspace disease.  There is no definite pleural effusion or pneumothorax. No acute osseous  abnormality is identified.          Impression:       Mild left basilar airspace disease favored to represent  atelectasis. Superimposed infection cannot be excluded.    Marius Ditch, MD   03/25/2016 2:10 PM      CT Head WO Contrast [[id number]] Collected:  03/25/16 1350    Order Status:  Completed Updated:  03/25/16 1356    Narrative:      This CT study was performed using radiation dose reduction techniques  including one or more of the following: automated exposure control,  adjustment of the mA and/or kV according to patient size and the use of  iterative reconstruction technique.    History: Confusion, generalized weakness.    Findings: Brain CT without intravenous contrast. No comparison studies.    There is mild diffuse parenchymal volume loss. There is hypodensity in  the supratentorial white matter consistent with moderate chronic small  vessel ischemic disease. There is no mass, acute intracranial  hemorrhage. The ventricular system and cisterns are normally configured.  The visualized paranasal sinuses and calvarium are unremarkable.      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    Terrilee Croak, MD   03/25/2016 1:52 PM          chest X-ray    Cardiac Enzymes        Invalid input(s): TROPONINI  ------------------------------------------------------------------------------------------------------------------    My personal review of EKG:   ACCELERATED JUNCTIONAL RHYTHM  NONSPECIFIC ST AND T WAVE  ABNORMALITY  ABNORMAL ECG  WHEN COMPARED WITH ECG OF 02-Aug-2015 20:20,  JUNCTIONAL RHYTHM HAS REPLACED SINUS RHYTHM       Personally reviewed Old Chart from 03/2016  High-resolution CT chest  Clinical history: Lung mass.    TECHNIQUE: Routine and high-resolution chest CT was performed without IV  contrast. The following dose reduction techniques were utilized:  automated exposure control and/or adjustment of the mA and/or kV  according to patient size, and the use of iterative reconstruction  technique.    COMPARISON: CT dated 03/18/2015.    FINDINGS:    Previously noted mild patchy groundglass opacities bilaterally have much  improved. There is mild linear atelectasis/scarring at the medial right  upper lobe, right middle lobe, and left lung base. There is mild  bronchiectasis. There is no significant interstitial septal thickening,  emphysema, or honeycombing changes identified. There is mild air  trapping.    There is no mediastinal or hilar adenopathy. A few small axillary lymph  nodes are unchanged. No pericardial effusion or pleural effusions are  seen. Bone windows show mild degenerative changes of the spine.    IMPRESSION:       Previously noted mild patchy groundglass opacities have much improved.  No discrete lung masses identified.    Georgiana Spinner, MD   02/26/2016    Anselmo Pickler M.D on 03/25/2016 at 4:44 PM

## 2016-03-25 NOTE — ED Provider Notes (Signed)
Physician/Midlevel provider first contact with patient: 03/25/16 1218         History     Chief Complaint   Patient presents with   . Altered Mental Status   . Shortness of Breath     HPI Comments:   Chief Complaint:  Generalized weakness, cough, shortness of breath, pharyngitis, altered mental status  Location:  Diffuse  Onset:  Day of presentation at 1000 hrs.  Character:  Improved  Aggravating/Alleviating Factors:  Aggravated by activity.  No alleviating factors.  Associated pharyngitis, shortness of breath, altered mental status.  Symptomatic treatment with prescribed medications.  Timing:  Episodic  Environment:  Home  Severity:  Moderate  Context:  Patient, with housekeeper, complains of generalized weakness, cough, shortness of breath, pharyngitis, altered mental status/confusion.  Rachael [name] states earlier today Rachael [name] was at home was unable to get up the steps due to generalized weakness.  Her housekeeper helped her up the stairs.  Upon arriving of stairs patient's that Rachael [name] seems somewhat confused.  Her to having a cough and feeling somewhat short of breath with a sore throat.  Patient has been taking her prescribed medications.    Patient states Rachael [name] was seen by her primary physician one day earlier.  Rachael [name] was diagnosed with an unknown infection and prescribed antibiotics.  Patient does not recall what the infection was, nor the antibiotics that Rachael [name] was prescribed.    Denies headache, vision expressive or receptive changes, chest pain, palpitations, abdominal pain, nausea, vomiting, diarrhea, dysuria, numbness, vertigo, trauma, travel, ill contacts or medication adjustments.    PMD  Ditaranto, j      The history is provided by the patient and medical records.            Past Medical History   Diagnosis Date   . Hypertension    . Chronic obstructive pulmonary disease    . Arthritis    . Polymyalgia rheumatica 09/2014   . Venous insufficiency of right leg 2016     also has lymphedema-KEEPS HER LEGS WRAPPED EXCEPT FOR  WHEN Rachael [name] BATHES   . Lymphedema of right lower extremity    . Type II or unspecified type diabetes mellitus without mention of complication, not stated as uncontrolled    . Gastroesophageal reflux disease    . Hypothyroidism    . Hyperlipidemia    . Type 2 diabetes mellitus, controlled    . Sleep apnea      USES CPAP NIGHTLY   . Hiatal hernia    . Low back pain    . 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 Anxiety    . Incontinent of urine 2014     -WEARS DIAPERS-WILL BRING HER OWN       Past Surgical History   Procedure Laterality Date   . Back surgery  2014   . Carpal tunnel release Bilateral 1978   . Hysterectomy  1985   . Robot assisted, laparoscopic, cholecystectomy N/A 10/31/2015     Procedure: ROBOT ASSISTED, LAPAROSCOPIC, CHOLECYSTECTOMY, UMBILICAL HERNIA REPAIR;  Surgeon: Alen Blew, MD;  Location:  MAIN OR;  Service: General;  Laterality: N/A;       No family history on file.    Social  Social History   Substance Use Topics   . Smoking status: Former Smoker -- 1.00 packs/day for 25 years     Quit date: 11/16/1998   . Smokeless tobacco: Never Used   . Alcohol  Use: 1.8 oz/week     3 Glasses of wine per week      Comment: social       .     Allergies   Allergen Reactions   . Tolectin [Tolmetin] Swelling   . Latex Itching       Home Medications     Last Medication Reconciliation Action:  Pharmacy Completed [place], Pearletha Furl 03/25/2016  6:34 PM                  albuterol (PROVENTIL) (2.5 MG/3ML) 0.083% nebulizer solution     TAKE (2.5MG ) BY NEBULIZATION EVERY 4 HOURS AS NEEDED FOR WHEEZING OR SHORTNESS OF BREATH (COUGH)     ALPRAZolam (XANAX) 0.5 MG tablet     TAKE 1 TABLET BY MOUTH EVERY OTHER DAY AS NEEDED     Calcium Carbonate-Vit D-Min (CALCIUM 1200 PO)     Take by mouth.     diltiazem (CARDIZEM CD) 240 MG 24 hr capsule     Take 240 mg by mouth daily.     DULoxetine (CYMBALTA) 60 MG capsule     Take 60 mg by mouth 2 (two) times daily.     folic acid (FOLVITE) 1 MG  tablet     Take 1 mg by mouth daily.        gabapentin (NEURONTIN) 300 MG capsule     Take 300 mg by mouth 2 (two) times daily.     levothyroxine (SYNTHROID, LEVOTHROID) 50 MCG tablet     Take 50 mcg by mouth Once a day at 6:00am.     losartan-hydrochlorothiazide (HYZAAR) 100-25 MG per tablet     Take 1 tablet by mouth every morning.     metFORMIN (GLUCOPHAGE-XR) 500 MG 24 hr tablet     Take 500 mg by mouth nightly.        metoprolol XL (TOPROL-XL) 50 MG 24 hr tablet     Take 50 mg by mouth daily.     pantoprazole (PROTONIX) 40 MG tablet     Take 40 mg by mouth daily.        potassium chloride (K-TAB,KLOR-CON) 10 MEQ tablet     Take 10 mEq by mouth daily.        predniSONE (DELTASONE) 5 MG tablet     Take 4 mg by mouth daily.        SYMBICORT 160-4.5 MCG/ACT inhaler     Inhale 2 puffs into the lungs 2 (two) times daily.        traMADol (ULTRAM) 50 MG tablet     TAKE 1 TABLET BY MOUTH TWICE DAILY AS NEEDED FOR PAIN     traZODone (DESYREL) 100 MG tablet     Take 100 mg by mouth nightly.     ZETIA 10 MG tablet     Take 10 mg by mouth daily.              Review of Systems   Constitutional: Negative for fever, chills, diaphoresis, activity change and unexpected weight change.   HENT: Positive for sore throat. Negative for congestion, rhinorrhea, trouble swallowing and voice change.    Eyes: Negative for pain, discharge and redness.   Respiratory: Positive for cough and shortness of breath. Negative for chest tightness and wheezing.    Cardiovascular: Negative for chest pain, palpitations and leg swelling.   Gastrointestinal: Negative for nausea, vomiting, abdominal pain and diarrhea.   Endocrine: Negative for polydipsia, polyphagia and polyuria.   Genitourinary: Negative  for dysuria, urgency, frequency, decreased urine volume, vaginal bleeding, vaginal discharge and difficulty urinating.   Musculoskeletal: Negative for myalgias, back pain, arthralgias and neck pain.   Skin: Negative for color change, rash and wound.    Allergic/Immunologic: Negative for environmental allergies, food allergies and immunocompromised state.   Neurological: Positive for weakness. Negative for dizziness, tremors, seizures, syncope, facial asymmetry, speech difficulty, light-headedness, numbness and headaches.   Hematological: Negative for adenopathy. Does not bruise/bleed easily.   Psychiatric/Behavioral: Positive for confusion. Negative for sleep disturbance, dysphoric mood, decreased concentration and agitation. The patient is not nervous/anxious and is not hyperactive.    All other systems reviewed and are negative.      Physical Exam    BP: 129/62 mmHg, Heart Rate: (!) 117, Temp: 100.3 F (37.9 C), Resp Rate: 20, SpO2: 91 %, Weight: 79.379 kg    Physical Exam   Constitutional: Rachael [name] is oriented to person, place, and time. Rachael [name] appears well-developed and well-nourished. Rachael [name] is cooperative.  Non-toxic appearance. Rachael [name] does not appear ill. No distress.   HENT:   Head: Normocephalic and atraumatic.   Right Ear: External ear normal.   Left Ear: External ear normal.   Nose: Nose normal. No mucosal edema or rhinorrhea. Right sinus exhibits no maxillary sinus tenderness and no frontal sinus tenderness. Left sinus exhibits no maxillary sinus tenderness and no frontal sinus tenderness.   Mouth/Throat: Oropharynx is clear and moist. Mucous membranes are dry. No oropharyngeal exudate, posterior oropharyngeal edema, posterior oropharyngeal erythema or tonsillar abscesses.   Eyes: Conjunctivae and lids are normal. Pupils are equal, round, and reactive to light. Right eye exhibits no discharge. Left eye exhibits no discharge. Right conjunctiva is not injected. Left conjunctiva is not injected. No scleral icterus.   Neck: Normal range of motion. Neck supple. No thyromegaly present.   Cardiovascular: Regular rhythm, normal heart sounds and intact distal pulses.  Tachycardia present.    Pulses:       Radial pulses are 2+ on the right side.   Pulmonary/Chest: Effort  normal. No accessory muscle usage. No tachypnea and no bradypnea. No respiratory distress. Rachael [name] has wheezes in the right lower field and the left lower field. Rachael [name] has rales in the right lower field and the left lower field.   Abdominal: Soft. Bowel sounds are normal. Rachael [name] exhibits no distension, no abdominal bruit, no ascites, no pulsatile midline mass and no mass. There is no hepatosplenomegaly. There is no tenderness. There is no rigidity, no rebound, no guarding and no CVA tenderness. No hernia.   Obese     Musculoskeletal: Normal range of motion. Rachael [name] exhibits no edema or tenderness.   Lymphadenopathy:     Rachael [name] has no cervical adenopathy.   Neurological: Rachael [name] is alert and oriented to person, place, and time. No cranial nerve deficit or sensory deficit. Rachael [name] exhibits normal muscle tone. Coordination and gait normal. GCS eye subscore is 4. GCS verbal subscore is 5. GCS motor subscore is 6.   Reflex Scores:       Bicep reflexes are 2+ on the right side and 2+ on the left side.       Patellar reflexes are 2+ on the right side and 2+ on the left side.  Grip equal/strong. MS BUE/LE 5/5.  PF/DF intact.  Finger to nose and heel to shin intact.   Skin: Skin is warm and dry. No abrasion, no ecchymosis, no laceration, no lesion, no petechiae and no rash noted. Rachael [name] is not diaphoretic. No erythema. Nails show  no clubbing.   Psychiatric: Rachael [name] has a normal mood and affect. Her speech is normal and behavior is normal. Judgment and thought content normal. Cognition and memory are normal.   Nursing note and vitals reviewed.        MDM and ED Course     ED Medication Orders     Start Ordered     Status Ordering Provider    03/25/16 1328 03/25/16 1327  cefTRIAXone (ROCEPHIN) 1 g in sodium chloride 0.9 % 100 mL IVPB mini-bag plus   Once     Route: Intravenous  Ordered Dose: 1 g     Last MAR action:  Stopped Deyja Sochacki A    03/25/16 1328 03/25/16 1327  azithromycin (ZITHROMAX) tablet 500 mg   Daily     Route: Oral  Ordered Dose: 500 mg      Last MAR action:  Given Titianna Loomis A    03/25/16 1231 03/25/16 1230  sodium chloride 0.9 % bolus 2,382 mL   Once     Route: Intravenous  Ordered Dose: 30 mL/kg     Last MAR action:  Stopped Ausencio Vaden A    03/25/16 1231 03/25/16 1230     Continuous,   Status:  Discontinued     Route: Intravenous     Discontinued Dorianne Perret A             MDM  Number of Diagnoses or Management Options  Altered mental status, unspecified altered mental status type: new and requires workup  COPD exacerbation: new and requires workup  Sepsis, due to unspecified organism: new and requires workup  Diagnosis management comments: Plan:  Iv, fluids, labs, meds, ekg, monitoring, imaging, supportive care, reassess.    1430 pt recheck.  No additional c/o.  Exam unchanged.  Initial results reviewed.  Will initiate abx, ivfs and observe    1500 pt recheck.   Rachael [name] is resting quietly and comfortably in exam room.  No additional c/o.  Exam unchanged.    1600 joules reviewed.  Admission recommended.  Patient is amenable.  We will discuss with hospitalist for admission orders.    The attending signature signifies review and agreement of the history , PE, evaluation, clinical impression and discharge plan except as otherwise noted.    I,Romir Klimowicz, PA-C, have been the primary provider for Rachael [name] during this Emergency Dept visit.    Oxygen saturation by pulse oximetry is 91%-94%, Low Normal.  Interventions: Patient Observed.    Plan:  inpt med    Results     Procedure Component Value Units Date/Time    CULTURE BLOOD AEROBIC AND ANAEROBIC #2 ([id number]) Collected:  03/25/16   1500    Specimen Information:  Arm from Blood Updated:  03/25/16 1940    Narrative:      1 BLUE+1 PURPLE    CULTURE BLOOD AEROBIC AND ANAEROBIC #1 ([id number]) Collected:  03/25/16   1252    Specimen Information:  Arm from Blood Updated:  03/25/16 1753    Narrative:      1 BLUE+1 PURPLE    UA with reflex to micro (all hospital ED's and [facility],   pts 3 + yrs) ([id number]) Collected:  03/25/16 1658    Specimen Information:  Urine Updated:  03/25/16 1709     Urine Type Clean Catch      Color, UA Yellow      Clarity, UA Clear      Specific Gravity UA 1.010  Urine pH 6.0      Leukocyte Esterase, UA Negative      Nitrite, UA Negative      Protein, UR Negative      Glucose, UA Negative      Ketones UA Negative      Urobilinogen, UA Normal mg/dL      Bilirubin, UA Negative      Blood, UA Negative     Procalcitonin ([id number]) Collected:  03/25/16 1252     Updated:  03/25/16 1622    Lactic acid, plasma - sepsis initial specimen followed by a second   specimen if initial result is greater than 2.0 mEq/L ([id number])   Collected:  03/25/16 1500    Specimen Information:  Blood Updated:  03/25/16 1521     Lactic acid 1.2 mmol/L     Narrative:      Cancel second specimen if the initial lactate level is less  than 2.0 mEq/L.    TSH ([id number]) Collected:  03/25/16 1252    Specimen Information:  Blood Updated:  03/25/16 1345     Thyroid Stimulating Hormone 4.45 uIU/mL     Troponin I ([id number]) Collected:  03/25/16 1252    Specimen Information:  Blood Updated:  03/25/16 1329     Troponin I 0.02 ng/mL     B-type Natriuretic Peptide ([id number])  (Abnormal) Collected:  03/25/16   1252    Specimen Information:  Blood Updated:  03/25/16 1328     B-Natriuretic Peptide 100.7 (H) pg/mL     Comprehensive metabolic panel ([id number])  (Abnormal) Collected:    03/25/16 1252    Specimen Information:  Blood Updated:  03/25/16 1324     Glucose 156 (H) mg/dL      BUN 20.2 mg/dL      Creatinine 0.9 mg/dL      Sodium 542 mEq/L      Potassium 3.6 mEq/L      Chloride 96 (L) mEq/L      CO2 30 (H) mEq/L      Calcium 9.9 mg/dL      Protein, Total 6.8 g/dL      Albumin 3.7 g/dL      AST (SGOT) 15 U/L      ALT 12 U/L      Alkaline Phosphatase 62 U/L      Bilirubin, Total 0.4 mg/dL      Globulin 3.1 g/dL      Albumin/Globulin Ratio 1.2      Anion Gap 13.0     Magnesium ([id number])   (Abnormal) Collected:  03/25/16 1252    Specimen Information:  Blood Updated:  03/25/16 1324     Magnesium 1.5 (L) mg/dL     GFR ([id number]) Collected:  03/25/16 1252     EGFR >60.0 Updated:  03/25/16 1324    Lactic acid, plasma - sepsis initial specimen followed by a second   specimen if initial result is greater than 2.0 mEq/L ([id number])    (Abnormal) Collected:  03/25/16 1252    Specimen Information:  Blood Updated:  03/25/16 1320     Lactic acid 2.4 (H) mmol/L     Narrative:      Cancel second specimen if the initial lactate level is less  than 2.0 mEq/L.    Rapid Strep (Group A Antigen) ([id number]) Collected:  03/25/16 1252    Specimen Information:  Throat Updated:  03/25/16 1317     Group A Strep, Rapid Antigen Negative     Protime-INR ([id number])  (Abnormal) Collected:  03/25/16 1252    Specimen Information:  Blood Updated:  03/25/16 1316     PT 12.5 (L) sec      PT INR 0.9      PT Anticoag. Given Within 48 hrs. None     APTT ([id number]) Collected:  03/25/16 1252     PTT 31 sec Updated:  03/25/16 1316    CBC with differential ([id number])  (Abnormal) Collected:  03/25/16 1252    Specimen Information:  Blood from Blood Updated:  03/25/16 1314     WBC 10.28 x10 3/uL      Hgb 13.3 g/dL      Hematocrit 78.2 %      Platelets 247 x10 3/uL      RBC 4.56 x10 6/uL      MCV 91.7 fL      MCH 29.2 pg      MCHC 31.8 (L) g/dL      RDW 15 %      MPV 9.2 (L) fL      Neutrophils 82 %      Lymphocytes Automated 8 %      Monocytes 7 %      Eosinophils Automated 2 %      Basophils Automated 1 %      Immature Granulocyte 0 %      Neutrophils Absolute 8.41 (H) x10 3/uL      Abs Lymph Automated 0.86 x10 3/uL      Abs Mono Automated 0.73 x10 3/uL      Abs Eos Automated 0.21 x10 3/uL      Absolute Baso Automated 0.07 x10 3/uL      Absolute Immature Granulocyte 0.04 x10 3/uL         Radiology Results (24 Hour)     Procedure Component Value Units Date/Time    Chest AP Portable ([id number]) Collected:  03/25/16 1409    Order Status:   Completed Updated:  03/25/16 1414    Narrative:      HISTORY: Sepsis    COMPARISON: 08/02/2015    FINDINGS: Single portable view of the chest was obtained. The  cardiomediastinal silhouette appears stable. There is aortic  atherosclerotic disease. There is mild left basilar airspace disease.  There is no definite pleural effusion or pneumothorax. No acute osseous  abnormality is identified.     Impression:       Mild left basilar airspace disease favored to represent  atelectasis. Superimposed infection cannot be excluded.    Marius Ditch, MD   03/25/2016 2:10 PM     CT Head WO Contrast ([id number]) Collected:  03/25/16 1350    Order Status:  Completed Updated:  03/25/16 1356    Narrative:      This CT study was performed using radiation dose reduction techniques  including one or more of the following: automated exposure control,  adjustment of the mA and/or kV according to patient size and the use of  iterative reconstruction technique.    History: Confusion, generalized weakness.    Findings: Brain CT without intravenous contrast. No comparison studies.    There is mild diffuse parenchymal volume loss. There is hypodensity in  the supratentorial white matter consistent with moderate chronic small  vessel ischemic disease. There is no mass, acute intracranial  hemorrhage. The ventricular system and cisterns are normally configured.  The visualized paranasal sinuses and calvarium are unremarkable.     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    Terrilee Croak, MD   03/25/2016 1:52 PM         I have reviewed all labs and/or radiological studies. I have reviewed all xrays if any myself on the PACS system.      " *This note was generated by the Epic EMR system/ Dragon speech recognition and   may contain inherent errors or omissions not intended by the user. Grammatical    errors, random word insertions, deletions, pronoun errors and  incomplete   sentences are occasional consequences of this technology due to software   limitations. Not all errors are caught or corrected. If there are questions or    concerns about the content of this note or information contained within the body   of this dictation they should be addressed directly with the author for   Clarification."        EKG interpreted by ED physician assistant    Rate: Tachycardic  Rhythm: sinus tachycardia  Axis: Normal  ST Segments: Non-specific ST segment change(s) Lateral leads v6  T waves: Non-specific T Wave change(s) Anterior leads Inferior leads Lateral leads III aVl aVf v1 v2 v5 v6  Conduction: No blocks  Impression: Non-specific EKG    No change from previous EKG 9.16.16    Reason for not initiating tPA (alteplase) within 4.5 hours of onset of symptoms: Rapid improvement on NIHSS to 0 with no measureable deficit          Amount and/or Complexity of Data Reviewed  Clinical lab tests: ordered and reviewed  Tests in the radiology section of CPT: ordered and reviewed (    CT Head WO Contrast (Final result) Result time: 03/25/16 13:52:43   Final result by Delora Fuel, MD (03/25/16 13:52:43)   Impression:   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    Terrilee Croak, MD   03/25/2016 1:52 PM     Narrative:   This CT study was performed using radiation dose reduction techniques  including one or more of the following: automated exposure control,  adjustment of the mA and/or kV according to patient size and the use of  iterative reconstruction technique.    History: Confusion, generalized weakness.    Findings: Brain CT without intravenous contrast. No comparison studies.    There is mild diffuse parenchymal volume loss. There is hypodensity in  the supratentorial white matter consistent with moderate chronic small  vessel ischemic disease. There is no mass, acute intracranial  hemorrhage. The ventricular  system and cisterns are normally configured.  The visualized paranasal sinuses and calvarium are unremarkable.           Chest AP Portable (Final result) Result time: 03/25/16 14:10:13   Final result by Valeria Batman, MD (03/25/16 14:10:13)   Impression:   Mild left basilar airspace disease favored to represent  atelectasis. Superimposed infection cannot be excluded.    Marius Ditch, MD   03/25/2016 2:10 PM     Narrative:   HISTORY: Sepsis    COMPARISON: 08/02/2015    FINDINGS: Single portable view of the chest was obtained. The  cardiomediastinal silhouette appears stable. There is aortic  atherosclerotic disease. There is mild left basilar airspace disease.  There is no definite pleural effusion or pneumothorax. No acute osseous  abnormality is identified.       )  Review and summarize past medical records: yes  Discuss the patient with other  providers: yes (ED case d/w attending, olderog, c, who examined the patient and agrees with the current course, tx, and disposition plan.    1615 d/w Morrie Sheldon NP, Dwaine Gale, inpt )  Independent visualization of images, tracings, or specimens: yes    Risk of Complications, Morbidity, and/or Mortality  Presenting problems: high  Diagnostic procedures: high  Management options: high    Critical Care  Total time providing critical care: 30-74 minutes    Patient Progress  Patient progress: stable            Critical Care  Performed by: Laurelin Elson A  Authorized by: Anselmo Pickler    Critical care provider statement:     Critical care time (minutes):  37    Critical care time was exclusive of:  Separately billable procedures and treating other patients    Critical care was necessary to treat or prevent imminent or life-threatening deterioration of the following conditions:  Respiratory failure, circulatory failure and metabolic crisis    Critical care was time spent personally by me on the following activities:  Obtaining history from patient or  surrogate, development of treatment plan with patient or surrogate, discussions with consultants, evaluation of patient's response to treatment, examination of patient, ordering and performing treatments and interventions, ordering and review of laboratory studies, ordering and review of radiographic studies, re-evaluation of patient's condition and review of old charts    I assumed direction of critical care for this patient from another provider in my specialty: no        Clinical Impression & Disposition     Clinical Impression  Final diagnoses:   Sepsis, due to unspecified organism   COPD exacerbation   Altered mental status, unspecified altered mental status type        ED Disposition     Admit Admitting Physician: Anselmo Pickler [[id number]]  Diagnosis: COPD exacerbation [331795]  Estimated Length of Stay: > or = to 2 midnights  Tentative Discharge Plan?: Home or Self Care [1]  Patient Class: Inpatient [101]             New Prescriptions    No medications on file                   Valente David, [place]  03/25/16 1954    Larina Bras, MD  03/27/16 1243

## 2016-03-25 NOTE — ED Notes (Signed)
Patient soiled her depends. ADL's provided, patient changed into a new depends.

## 2016-03-26 ENCOUNTER — Inpatient Hospital Stay: Payer: No Typology Code available for payment source

## 2016-03-26 DIAGNOSIS — A419 Sepsis, unspecified organism: Principal | ICD-10-CM

## 2016-03-26 LAB — HEMOGLOBIN A1C
Average Estimated Glucose: 168.6 mg/dL
Hemoglobin A1C: 7.5 % — ABNORMAL HIGH (ref 4.6–5.9)

## 2016-03-26 LAB — ARTERIAL BLOOD GAS (~~LOC~~)
Base Excess, Arterial: 4.8 mEq/L — ABNORMAL HIGH (ref <=2.0)
HCO3, Arterial: 29.9 mEq/L — ABNORMAL HIGH (ref 22.0–28.0)
O2 Flow: 4 L/min
O2 Sat, Arterial: 92 % — ABNORMAL LOW (ref 95.0–100.0)
Temperature: 98.6
pCO2, Arterial: 45 mmhg (ref 35.0–45.0)
pH, Arterial: 7.43 (ref 7.35–7.45)
pO2, Arterial: 61 mmhg — ABNORMAL LOW (ref 80.0–100.0)

## 2016-03-26 LAB — BASIC METABOLIC PANEL
Anion Gap: 10 (ref 5.0–15.0)
BUN: 9 mg/dL (ref 7.0–19.0)
CO2: 29 mEq/L (ref 22–29)
Calcium: 8.8 mg/dL (ref 7.9–10.2)
Chloride: 100 mEq/L (ref 100–111)
Creatinine: 0.7 mg/dL (ref 0.6–1.0)
Glucose: 187 mg/dL — ABNORMAL HIGH (ref 70–100)
Potassium: 3.3 mEq/L — ABNORMAL LOW (ref 3.5–5.1)
Sodium: 139 mEq/L (ref 136–145)

## 2016-03-26 LAB — CBC AND DIFFERENTIAL
Basophils Absolute Automated: 0.04 10*3/uL (ref 0.00–0.20)
Basophils Automated: 0 %
Eosinophils Absolute Automated: 0.19 10*3/uL (ref 0.00–0.70)
Eosinophils Automated: 2 %
Hematocrit: 37.8 % (ref 37.0–47.0)
Hgb: 12 g/dL (ref 12.0–16.0)
Immature Granulocytes Absolute: 0.03 10*3/uL
Immature Granulocytes: 0 %
Lymphocytes Absolute Automated: 0.67 10*3/uL (ref 0.50–4.40)
Lymphocytes Automated: 8 %
MCH: 28.9 pg (ref 28.0–32.0)
MCHC: 31.7 g/dL — ABNORMAL LOW (ref 32.0–36.0)
MCV: 91.1 fL (ref 80.0–100.0)
MPV: 8.8 fL — ABNORMAL LOW (ref 9.4–12.3)
Monocytes Absolute Automated: 0.33 10*3/uL (ref 0.00–1.20)
Monocytes: 4 %
Neutrophils Absolute: 6.71 10*3/uL (ref 1.80–8.10)
Neutrophils: 84 %
Platelets: 236 10*3/uL (ref 140–400)
RBC: 4.15 10*6/uL — ABNORMAL LOW (ref 4.20–5.40)
RDW: 15 % (ref 12–15)
WBC: 7.94 10*3/uL (ref 3.50–10.80)

## 2016-03-26 LAB — ECG 12-LEAD
Atrial Rate: 119 {beats}/min
Q-T Interval: 460 ms
QRS Duration: 62 ms
QTC Calculation (Bezet): 630 ms
R Axis: 36 degrees
T Axis: 96 degrees
Ventricular Rate: 113 {beats}/min

## 2016-03-26 LAB — GFR: EGFR: >60

## 2016-03-26 LAB — MAGNESIUM: Magnesium: 2 mg/dL (ref 1.6–2.6)

## 2016-03-26 LAB — GLUCOSE WHOLE BLOOD - POCT
Whole Blood Glucose POCT: 381 mg/dL — ABNORMAL HIGH (ref 70–100)
Whole Blood Glucose POCT: 361 mg/dL — ABNORMAL HIGH (ref 70–100)
Whole Blood Glucose POCT: 250 mg/dL — ABNORMAL HIGH (ref 70–100)

## 2016-03-26 LAB — TROPONIN I
Troponin I: 0.02 ng/mL (ref 0.00–0.09)
Troponin I: <0.01 ng/mL (ref 0.00–0.09)

## 2016-03-26 LAB — PROCALCITONIN: Procalcitonin: <0.1 (ref 0.0–0.1)

## 2016-03-26 LAB — B-TYPE NATRIURETIC PEPTIDE: B-Natriuretic Peptide: 101.3 pg/mL — ABNORMAL HIGH (ref 0.0–100.0)

## 2016-03-26 MED ORDER — POTASSIUM CHLORIDE CRYS ER 20 MEQ PO TBCR
40.0000 meq | EXTENDED_RELEASE_TABLET | Freq: Two times a day (BID) | ORAL | Status: AC
Start: 2016-03-26 — End: 2016-03-26
  Administered 2016-03-26 (×2): 40 meq via ORAL
  Filled 2016-03-26 (×2): qty 2

## 2016-03-26 MED ORDER — INSULIN GLARGINE 100 UNIT/ML SC SOLN
12.0000 [IU] | Freq: Every evening | SUBCUTANEOUS | Status: DC
Start: 2016-03-26 — End: 2016-03-27
  Administered 2016-03-26: 12 [IU] via SUBCUTANEOUS
  Filled 2016-03-26: qty 12

## 2016-03-26 MED ORDER — ALBUTEROL-IPRATROPIUM 2.5-0.5 (3) MG/3ML IN SOLN
3.0000 mL | RESPIRATORY_TRACT | Status: DC
Start: 2016-03-26 — End: 2016-04-01
  Administered 2016-03-26 – 2016-04-01 (×26): 3 mL via RESPIRATORY_TRACT
  Filled 2016-03-26 (×21): qty 3

## 2016-03-26 MED ORDER — EZETIMIBE 10 MG PO TABS
10.0000 mg | ORAL_TABLET | Freq: Every day | ORAL | Status: DC
Start: 2016-03-26 — End: 2016-04-01
  Administered 2016-03-26 – 2016-04-01 (×7): 10 mg via ORAL
  Filled 2016-03-26 (×7): qty 1

## 2016-03-26 MED ORDER — METOPROLOL SUCCINATE ER 50 MG PO TB24
50.0000 mg | ORAL_TABLET | Freq: Every day | ORAL | Status: DC
Start: 2016-03-26 — End: 2016-03-31
  Administered 2016-03-26 – 2016-03-31 (×6): 50 mg via ORAL
  Filled 2016-03-26 (×6): qty 1

## 2016-03-26 MED ORDER — DILTIAZEM HCL ER COATED BEADS 240 MG PO CP24
240.0000 mg | ORAL_CAPSULE | Freq: Every day | ORAL | Status: DC
Start: 2016-03-26 — End: 2016-04-01
  Administered 2016-03-26 – 2016-04-01 (×7): 240 mg via ORAL
  Filled 2016-03-26 (×7): qty 1

## 2016-03-26 MED ORDER — INSULIN GLARGINE 100 UNIT/ML SC SOLN
10.0000 [IU] | Freq: Once | SUBCUTANEOUS | Status: AC
Start: 2016-03-26 — End: 2016-03-26
  Administered 2016-03-26: 10 [IU] via SUBCUTANEOUS
  Filled 2016-03-26: qty 10

## 2016-03-26 NOTE — Plan of Care (Signed)
Problem: Safety  Goal: Patient will be free from injury during hospitalization  Outcome: Progressing  Intervention: Assess patient's risk for falls and implement fall prevention plan of care per policy  .  Intervention: Provide and maintain safe environment  .  Intervention: Include patient/family/caregiver in decisions related to safety  .  Intervention: Hourly rounding.  .

## 2016-03-26 NOTE — Progress Notes (Signed)
03/26/16 1147   Patient Type   Within 30 Days of Previous Admission? No   Medicare focused diagnosis patient? Not a Medicare focused diagnosis patient   Bundle patient? Not a bundle patient   Healthcare Decisions   Interviewed: Patient;Family  (DAUGHTER BY THE BED SIDE)   Interviewee Contact Information: Lavell Anchors, [phone]   Orientation/Decision Making Abilities of Patient Alert and Oriented x3, able to make decisions   Additional Emergency Contacts? SAME ON FILE   Prior to admission   Prior level of function Ambulates with assistive device;Needs assistance with ADLs   Home Layout Two level  (LIVES IN BASEMENT/ LIVES WITH  DAUGHTER)   Have running water, electricity, heat, etc? Yes   How do you get to your MD appointments? INDEPENDENT   Dressing Independent   Grooming Independent   Feeding Independent   Bathing Independent   Toileting Independent   DME Currently at Home Single point cane;Front wheel walker   Adult Protective Services (APS) involved? No   Discharge Planning   Support Systems Children   Patient expects to be discharged to: HOME WITH HH/SN/PT/OT.  REFERRAL MADE AT [phone].   Anticipated [place] plan discussed with: Patient;Family   Follow up appointment scheduled? No   Reason no follow up scheduled? Other (comment)  (TBM BEFORE [place])   Potential barriers to discharge: Testing/procedure   Mode of transportation: Private car (family member)   Consults/Providers   PT Evaluation Needed 1   OT Evalulation Needed 1   Correct PCP listed in Epic? Yes   Important Message from [facility] Notice   Patient received 1st IMM Letter? No

## 2016-03-26 NOTE — Progress Notes (Signed)
[facility] Hospitalist Daily Progress Note        Date Time: 03/26/2016  12:41 PM  Patient Name:Rachael [name]  UJW:[id number]  PCP: Jocelyn Lamer, DO  Attending Physician:Estela Vinal Glenis Smoker M.D.      Chief Complaint:      Chief Complaint   Patient presents with   . Altered Mental Status   . Shortness of Breath       Subjective:     Rachael [name] is slightly better.  Continues to be short of breath, coughing which is nonproductive   No headache, No chest pain, No abdominal pain - No Nausea, No new weakness tingling or numbness.  Assessment/Plan   Acute hypoxic respiratory failure secondary to chronic obstructive pulmonary disease exacerbation, most likely secondary to community acquired pneumonia:  Continue Rocephin and azithromycin IV steroids, around-the-clock nebulizers  Follow cultures including sputum culture  Appreciate pulmonology input  Lactic acidosis : Resolved with IV fluids  Altered mental status secondary to above: Resolved  Intermittent chest pain: Most likely secondary to above, resolved.  Patient has been ruled out.  Appreciate cardiology input  Polymyalgia rheumatica: Patient on low-dose of prednisone at home, which is currently being withheld as the patient is currently on IV steroids  Diabetes mellitus 2: Hemoglobin A1c 7.5  Blood sugars uncontrolled secondary to steroids  We will administer additional Lantus and continue high-dose sliding scale and monitor trends  Hypertension  Blood pressure stable  Hypothyroidism  Thyroid-stimulating hormone is normal.  Continue Synthroid  Gastroesophageal reflux disease  Obstructive sleep apnea    DVT Prohylaxis: Lovenox  Code Status: Full Code   Prognosis: Guarded  Type of Admission:Inpatient  Estimated Length of Stay (including stay in the ER receiving treatment): Greater than 2 midnights  Medical Necessity for stay: Acute hypoxic respiratory failure, chronic obstructive pulmonary disease exacerbation,  clinically quite pneumonia.  Lactic acidosis    Allergies:     Allergies   Allergen Reactions   . Tolectin [Tolmetin] Swelling   . Latex Itching       Physical Exam:   Vitals reviewed:   height is 1.524 m (5') and weight is 79.379 kg (175 lb). Her temporal artery temperature is 97.3 F (36.3 C). Her blood pressure is 143/71 and her pulse is 106. Her respiration is 21 and oxygen saturation is 93%.   Body mass index is 34.18 kg/(m^2).  Filed Vitals:    03/26/16 0137 03/26/16 0628 03/26/16 0712 03/26/16 0915   BP: 133/68 138/74  143/71   Pulse: 104 103 96 106   Temp: 98 F (36.7 C) 98.1 F (36.7 C)  97.3 F (36.3 C)   TempSrc: Temporal Artery Temporal Artery  Temporal Artery   Resp: 20 20 20 21    Height:       Weight:       SpO2: 91% 91% 88% 93%     Intake and Output Summary (Last 24 hours) at Date Time    Intake/Output Summary (Last 24 hours) at 03/26/16 1241  Last data filed at 03/26/16 0100   Gross per 24 hour   Intake    400 ml   Output      0 ml   Net    400 ml       Exam  Awake Alert, Oriented *3, No new F.N deficits, in mild respiratory distress  NC.AT,PERRAL  Supple Neck,No JVD, No cervical lymphadenopathy appriciated.   Symmetrical Chest wall movement, limited air movement bilaterally, coarse, diffuse bilateral inspiratory and expiratory rhonchi  with bibasilar crackles  RRR,No Gallops,Rubs or new Murmurs, No Parasternal Heave  +ve B.Sounds, Abd Soft, Non tender, No organomegaly appriciated, No rebound -guarding or rigidity.  No Cyanosis, Clubbing or edema, No new Rash or bruise    Consult Input/Plan     Pulmonology  Cardiologist    Medications:     Current Facility-Administered Medications   Medication Dose Route Frequency Last Rate Last Dose   . acetaminophen (TYLENOL) tablet 650 mg  650 mg Oral Q4H PRN   650 mg at 03/25/16 2255    Or   . acetaminophen (TYLENOL) suppository 650 mg  650 mg Rectal Q4H PRN       . albuterol-ipratropium (DUO-NEB) 2.5-0.5(3) mg/3 mL nebulizer 3 mL  3 mL Nebulization Q4H WA   3  mL at 03/26/16 1117   . ALPRAZolam Prudy Feeler) tablet 0.25 mg  0.25 mg Oral TID PRN   0.25 mg at 03/25/16 2255   . azithromycin (ZITHROMAX) 500 mg in sodium chloride 0.9 % 250 mL IVPB  500 mg Intravenous Q24H SCH       . cefTRIAXone (ROCEPHIN) 1 g in sodium chloride 0.9 % 100 mL IVPB mini-bag plus  1 g Intravenous Q24H SCH 200 mL/hr at 03/26/16 1042 1 g at 03/26/16 1042   . dextrose (GLUCOSE) 40 % oral gel 15 g of glucose  15 g of glucose Oral PRN        And   . dextrose (D10W) 10% bolus 125 mL  125 mL Intravenous PRN        And   . glucagon (rDNA) (GLUCAGEN) injection 1 mg  1 mg Intramuscular PRN       . dilTIAZem (CARDIZEM CD) 24 hr capsule 240 mg  240 mg Oral Daily   240 mg at 03/26/16 1042   . docusate sodium (COLACE) capsule 100 mg  100 mg Oral Q12H   100 mg at 03/26/16 1042   . DULoxetine (CYMBALTA) DR capsule 60 mg  60 mg Oral BID   60 mg at 03/26/16 1042   . enoxaparin (LOVENOX) syringe 40 mg  40 mg Subcutaneous Daily   40 mg at 03/25/16 2255   . ezetimibe (ZETIA) tablet 10 mg  10 mg Oral Daily   10 mg at 03/26/16 1042   . famotidine (PEPCID) tablet 20 mg  20 mg Oral Daily   20 mg at 03/25/16 2256   . fluticasone furoate-vilanterol (BREO ELLIPTA) 100-25 MCG/INH 1 puff  1 puff Inhalation QAM   1 puff at 03/26/16 0712   . folic acid (FOLVITE) tablet 1 mg  1 mg Oral Daily   1 mg at 03/26/16 1042   . gabapentin (NEURONTIN) capsule 300 mg  300 mg Oral BID   300 mg at 03/26/16 1116   . guaiFENesin (MUCINEX) 12 hr tablet 600 mg  600 mg Oral Q12H SCH   600 mg at 03/26/16 1042   . losartan (COZAAR) tablet 100 mg  100 mg Oral QAM   100 mg at 03/26/16 0809    And   . hydroCHLOROthiazide (HYDRODIURIL) tablet 25 mg  25 mg Oral QAM   25 mg at 03/26/16 0806   . insulin glargine (LANTUS) injection 12 Units  12 Units Subcutaneous QHS       . insulin lispro (HumaLOG) injection 1-6 Units  1-6 Units Subcutaneous QHS PRN       . insulin lispro (HumaLOG) injection 2-10 Units  2-10 Units Subcutaneous TID AC PRN   10 Units at  03/26/16  1144   . lactobacillus/streptococcus (RISAQUAD) capsule 1 capsule  1 capsule Oral Daily   1 capsule at 03/26/16 1042   . lactulose (CHRONULAC) 10 GM/15ML solution 10 g  10 g Oral QD PRN       . levothyroxine (SYNTHROID, LEVOTHROID) tablet 50 mcg  50 mcg Oral Daily at 0600   50 mcg at 03/26/16 0728   . methylPREDNISolone sodium succinate (Solu-MEDROL) injection 40 mg  40 mg Intravenous Q8H   40 mg at 03/26/16 0729   . metoprolol (LOPRESSOR) injection 5 mg  5 mg Intravenous 4X Daily PRN       . metoprolol XL (TOPROL-XL) 24 hr tablet 50 mg  50 mg Oral Daily   50 mg at 03/26/16 1042   . naloxone (NARCAN) injection 0.2 mg  0.2 mg Intravenous PRN       . nitroglycerin (NITROSTAT) SL tablet 0.4 mg  0.4 mg Sublingual Q5 Min PRN       . ondansetron (ZOFRAN) injection 4 mg  4 mg Intravenous Q6H PRN       . pantoprazole (PROTONIX) EC tablet 40 mg  40 mg Oral Daily   40 mg at 03/26/16 0809   . polyethylene glycol (MIRALAX) packet 17 g  17 g Oral QD PRN       . potassium chloride (K-DUR,KLOR-CON) CR tablet 40 mEq  40 mEq Oral BID   40 mEq at 03/26/16 1042   . traMADol (ULTRAM) tablet 50 mg  50 mg Oral BID PRN   50 mg at 03/26/16 1042   . traZODone (DESYREL) tablet 100 mg  100 mg Oral QHS   100 mg at 03/25/16 2256          Labs:reviewed     Results     Procedure Component Value Units Date/Time    Glucose Whole Blood - POCT [[id number]]  (Abnormal) Collected:  03/26/16 1111     POCT - Glucose Whole blood 381 (H) mg/dL Updated:  40/98/11 9147    Troponin I [[id number]] Collected:  03/26/16 0928    Specimen Information:  Blood Updated:  03/26/16 1013     Troponin I <0.01 ng/mL     Arterial Blood Gas [[id number]]  (Abnormal) Collected:  03/26/16 0848     Status Oxygen Updated:  03/26/16 0859     ABG CollectionSite Right Radl      Allen's Test Yes      Temperature 98.6      pH, Arterial 7.43      pCO2, Arterial 45.0 mmhg      pO2, Arterial 61.0 (L) mmhg      HCO3, Arterial 29.9 (H) mEq/L      Base Excess, Arterial 4.8 (H) mEq/L      O2  Sat, Arterial 92.0 (L) %      O2 Flow 4.0 L/min     Rapid influenza A/B antigens [[id number]] Collected:  03/26/16 4401    Specimen Information:  Nasopharyngeal from Nasal Aspirate Updated:  03/26/16 0808    Narrative:      ORDER#: [id number]                                    ORDERED BY: Nolon Bussing  SOURCE: Nasal Aspirate                               COLLECTED:  03/26/16 07:28  ANTIBIOTICS AT COLL.:                                RECEIVED :  03/26/16 07:50  Influenza Rapid Antigen A&B                FINAL       03/26/16 08:08  03/26/16   Negative for Influenza A and B             Reference Range: Negative      Hemoglobin A1C [[id number]]  (Abnormal) Collected:  03/25/16 2143     Hemoglobin A1C 7.5 (H) % Updated:  03/26/16 0251     Average Estimated Glucose 168.6 mg/dL     Troponin I [[id number]] Collected:  03/26/16 0108    Specimen Information:  Blood Updated:  03/26/16 0159     Troponin I 0.02 ng/mL     B-type Natriuretic Peptide [[id number]]  (Abnormal) Collected:  03/26/16 0108    Specimen Information:  Blood Updated:  03/26/16 0158     B-Natriuretic Peptide 101.3 (H) pg/mL     Basic Metabolic Panel [[id number]]  (Abnormal) Collected:  03/26/16 0108    Specimen Information:  Blood Updated:  03/26/16 0156     Glucose 187 (H) mg/dL      BUN 9.0 mg/dL      Creatinine 0.7 mg/dL      Calcium 8.8 mg/dL      Sodium 284 mEq/L      Potassium 3.3 (L) mEq/L      Chloride 100 mEq/L      CO2 29 mEq/L      Anion Gap 10.0     GFR [[id number]] Collected:  03/26/16 0108     EGFR >60.0 Updated:  03/26/16 0156    Magnesium [[id number]] Collected:  03/26/16 0108     Magnesium 2.0 mg/dL Updated:  34/74/25 9563    CBC with differential [[id number]]  (Abnormal) Collected:  03/26/16 0108    Specimen Information:  Blood from Blood Updated:  03/26/16 0137     WBC 7.94 x10 3/uL      Hgb 12.0 g/dL      Hematocrit 51.8 %      Platelets 236 x10 3/uL      RBC 4.15 (L) x10 6/uL      MCV 91.1 fL      MCH 28.9 pg      MCHC 31.7 (L) g/dL      RDW  15 %      MPV 8.8 (L) fL      Neutrophils 84 %      Lymphocytes Automated 8 %      Monocytes 4 %      Eosinophils Automated 2 %      Basophils Automated 0 %      Immature Granulocyte 0 %      Neutrophils Absolute 6.71 x10 3/uL      Abs Lymph Automated 0.67 x10 3/uL      Abs Mono Automated 0.33 x10 3/uL      Abs Eos Automated 0.19 x10 3/uL      Absolute Baso Automated 0.04 x10 3/uL      Absolute Immature Granulocyte 0.03 x10 3/uL     Troponin I [[id number]] Collected:  03/25/16 2143    Specimen Information:  Blood Updated:  03/25/16 2216     Troponin I <0.01 ng/mL     D-Dimer [[id number]] Collected:  03/25/16 2143     D-Dimer 0.35 ug/mL FEU Updated:  03/25/16 2158    Narrative:      Please call [phone] if positive    Procalcitonin [[id number]] Collected:  03/25/16 1252     Updated:  03/25/16 2125    CULTURE BLOOD AEROBIC AND ANAEROBIC #2 [[id number]] Collected:  03/25/16 1500    Specimen Information:  Arm from Blood Updated:  03/25/16 1940    Narrative:      1 BLUE+1 PURPLE    CULTURE BLOOD AEROBIC AND ANAEROBIC #1 [[id number]] Collected:  03/25/16 1252    Specimen Information:  Arm from Blood Updated:  03/25/16 1753    Narrative:      1 BLUE+1 PURPLE    UA with reflex to micro (all hospital ED's and [facility], pts 3 + yrs) [[id number]] Collected:  03/25/16 1658    Specimen Information:  Urine Updated:  03/25/16 1709     Urine Type Clean Catch      Color, UA Yellow      Clarity, UA Clear      Specific Gravity UA 1.010      Urine pH 6.0      Leukocyte Esterase, UA Negative      Nitrite, UA Negative      Protein, UR Negative      Glucose, UA Negative      Ketones UA Negative      Urobilinogen, UA Normal mg/dL      Bilirubin, UA Negative      Blood, UA Negative     Lactic acid, plasma - sepsis initial specimen followed by a second specimen if initial result is greater than 2.0 mEq/L [[id number]] Collected:  03/25/16 1500    Specimen Information:  Blood Updated:  03/25/16 1521     Lactic acid 1.2 mmol/L     Narrative:       Cancel second specimen if the initial lactate level is less  than 2.0 mEq/L.    TSH [[id number]] Collected:  03/25/16 1252    Specimen Information:  Blood Updated:  03/25/16 1345     Thyroid Stimulating Hormone 4.45 uIU/mL     Troponin I [[id number]] Collected:  03/25/16 1252    Specimen Information:  Blood Updated:  03/25/16 1329     Troponin I 0.02 ng/mL     B-type Natriuretic Peptide [[id number]]  (Abnormal) Collected:  03/25/16 1252    Specimen Information:  Blood Updated:  03/25/16 1328     B-Natriuretic Peptide 100.7 (H) pg/mL     Comprehensive metabolic panel [[id number]]  (Abnormal) Collected:  03/25/16 1252    Specimen Information:  Blood Updated:  03/25/16 1324     Glucose 156 (H) mg/dL      BUN 18.8 mg/dL      Creatinine 0.9 mg/dL      Sodium 416 mEq/L      Potassium 3.6 mEq/L      Chloride 96 (L) mEq/L      CO2 30 (H) mEq/L      Calcium 9.9 mg/dL      Protein, Total 6.8 g/dL      Albumin 3.7 g/dL      AST (SGOT) 15 U/L      ALT 12 U/L      Alkaline Phosphatase 62 U/L      Bilirubin, Total 0.4 mg/dL      Globulin 3.1 g/dL      Albumin/Globulin Ratio 1.2      Anion Gap 13.0  Magnesium [[id number]]  (Abnormal) Collected:  03/25/16 1252    Specimen Information:  Blood Updated:  03/25/16 1324     Magnesium 1.5 (L) mg/dL     GFR [[id number]] Collected:  03/25/16 1252     EGFR >60.0 Updated:  03/25/16 1324    Lactic acid, plasma - sepsis initial specimen followed by a second specimen if initial result is greater than 2.0 mEq/L [[id number]]  (Abnormal) Collected:  03/25/16 1252    Specimen Information:  Blood Updated:  03/25/16 1320     Lactic acid 2.4 (H) mmol/L     Narrative:      Cancel second specimen if the initial lactate level is less  than 2.0 mEq/L.    Rapid Strep (Group A Antigen) [[id number]] Collected:  03/25/16 1252    Specimen Information:  Throat Updated:  03/25/16 1317     Group A Strep, Rapid Antigen Negative     Protime-INR [[id number]]  (Abnormal) Collected:  03/25/16 1252    Specimen  Information:  Blood Updated:  03/25/16 1316     PT 12.5 (L) sec      PT INR 0.9      PT Anticoag. Given Within 48 hrs. None     APTT [[id number]] Collected:  03/25/16 1252     PTT 31 sec Updated:  03/25/16 1316    CBC with differential [[id number]]  (Abnormal) Collected:  03/25/16 1252    Specimen Information:  Blood from Blood Updated:  03/25/16 1314     WBC 10.28 x10 3/uL      Hgb 13.3 g/dL      Hematocrit 18.8 %      Platelets 247 x10 3/uL      RBC 4.56 x10 6/uL      MCV 91.7 fL      MCH 29.2 pg      MCHC 31.8 (L) g/dL      RDW 15 %      MPV 9.2 (L) fL      Neutrophils 82 %      Lymphocytes Automated 8 %      Monocytes 7 %      Eosinophils Automated 2 %      Basophils Automated 1 %      Immature Granulocyte 0 %      Neutrophils Absolute 8.41 (H) x10 3/uL      Abs Lymph Automated 0.86 x10 3/uL      Abs Mono Automated 0.73 x10 3/uL      Abs Eos Automated 0.21 x10 3/uL      Absolute Baso Automated 0.07 x10 3/uL      Absolute Immature Granulocyte 0.04 x10 3/uL           Rads:reviewed   Radiological Procedure reviewed.  Radiology Results (24 Hour)     Procedure Component Value Units Date/Time    XR Chest 2 Views [[id number]] Collected:  03/26/16 0906    Order Status:  Completed Updated:  03/26/16 0912    Narrative:      HISTORY: Pneumonia follow-up. Left basilar atelectasis noted on recent  chest exam.    COMPARISON: Chest radiograph 03/25/2016 and chest CT/10/2016.    TECHNIQUE: AP portable and lateral projections of the chest.    FINDINGS:    Cardiac size and mediastinal contours are normal. Pulmonary vascularity  is normal, and the lungs are clear. There is no pneumothorax or pleural  effusion. No acute osseous abnormality is identified.      Impression:  1. Clear lungs.  2. Normal heart size.    Elizebeth Koller, MD   03/26/2016 9:08 AM      Chest AP Portable [[id number]] Collected:  03/25/16 1409    Order Status:  Completed Updated:  03/25/16 1414    Narrative:      HISTORY: Sepsis    COMPARISON:  08/02/2015    FINDINGS: Single portable view of the chest was obtained. The  cardiomediastinal silhouette appears stable. There is aortic  atherosclerotic disease. There is mild left basilar airspace disease.  There is no definite pleural effusion or pneumothorax. No acute osseous  abnormality is identified.          Impression:       Mild left basilar airspace disease favored to represent  atelectasis. Superimposed infection cannot be excluded.    Marius Ditch, MD   03/25/2016 2:10 PM      CT Head WO Contrast [[id number]] Collected:  03/25/16 1350    Order Status:  Completed Updated:  03/25/16 1356    Narrative:      This CT study was performed using radiation dose reduction techniques  including one or more of the following: automated exposure control,  adjustment of the mA and/or kV according to patient size and the use of  iterative reconstruction technique.    History: Confusion, generalized weakness.    Findings: Brain CT without intravenous contrast. No comparison studies.    There is mild diffuse parenchymal volume loss. There is hypodensity in  the supratentorial white matter consistent with moderate chronic small  vessel ischemic disease. There is no mass, acute intracranial  hemorrhage. The ventricular system and cisterns are normally configured.  The visualized paranasal sinuses and calvarium are unremarkable.      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    Terrilee Croak, MD   03/25/2016 1:52 PM            Multi-Disciplinary Care Input:   Case management notes: No Patient Care Coordination Note on file.     PT/OT/ST notes:          Signed by: Anselmo Pickler  03/26/2016 12:41 PM         This note was generated by the Epic EMR system/ Dragon speech recognition and may contain inherent errors or omissions not intended by the user. Grammatical errors, random word insertions, deletions, pronoun errors and incomplete  sentences are occasional consequences of this technology due to software limitations. Not all errors are caught or corrected. If there are questions or concerns about the content of this note or information contained within the body of this dictation they should be addressed directly with the author for clarification

## 2016-03-26 NOTE — Progress Notes (Addendum)
Home Health Referral          Referral from [facility] (Case Manager) for home health care upon discharge.    By [employer], the patient has the right to freely choose a home care provider.  Arrangements have been made with:     A company of the patients choosing. We have supplied the patient with a listing of providers in your area who asked to be included and participate in Medicare.   [facility], a home care agency that provides both adult home care services which is a wholly owned and operated by [employer] and participates in [employer]   The preferred provider of your insurance company. Choosing a home care provider other than your insurance company's preferred provider may affect your insurance coverage.    The Home Health Care Referral Form acknowledging the voluntary selection of the home care company has been completed, signed, and is on file.      Home Health Discharge Information     Your doctor has ordered Skilled Nursing, Physical Therapy and Occupational Therapy in-home service(s) for you while you recuperate at home, to assist you in the transition from hospital to home.    The agency that you or your representative chose to provide the service:  Name of Home Health Agency: [facility] [phone]    The Name of Equipment Company: [employer] [phone] 2L via nasal cannula continuous  Upon discharge hospital RT to provide portable Oxygen tank.  Patient/family to call above Home Oxygen company on discharge from the hospital to set up exact delivery time of the oxygen supplies to home.     The above services were set up by:  Clenton Pare  [facility] Liaison)   Phone      [phone]    Additional comments:   If you have not heard from your home health agency within 24-48 hours after discharge please call your agency to arrange a time for your first visit.  For any scheduling concerns or questions related to home health, such as time or date please contact your  home health agency at the number listed above.     Signed by: Clenton Pare RN, BSN  Date Time: 03/26/2016 2:21 PM

## 2016-03-26 NOTE — UM Notes (Signed)
Hello    REF# [id number]  Copy of MD order for IP status and admission clinicals for 03/25/16 - 03/26/16  C/b to Wynona Canes [phone]    Thank-you!        COPY OF MD ORDER :    03/25/16 1619  Admit to Inpatient Once    Status:    Question Answer Comment   Admitting Physician JADOON, Jerolyn Center S    Diagnosis COPD exacerbation    Estimated Length of Stay > or = to 2 midnights    Tentative Discharge Plan? Home or Self Care    Patient Class Inpatient              80 yo female to ED 03/25/16 1218 complains of generalized weakness, cough, shortness of breath, pharyngitis, altered mental status/confusion. She states earlier today she was at home was unable to get up the steps due to generalized weakness. Her housekeeper helped her up the stairs. C/o having a cough and feeling somewhat short of breath with a sore throat.  T 100.3, HR 117-114-116-112, RR 20, BP 135/68, sats 89%  PMH : HTN, COPD, arthritis, lymphedema, DM, GERD, HLD, sleep apnea, LBP, anxiety  Glucose 156, Chlor 96, Co2 30, Lactic acid 2.4, Mg 1.5, BNP 100.7  Blood cx xs 2 : pending  CXRY : Mild left basilar airspace disease favored to represent atelectasis. Superimposed infection cannot be excluded.  CT head : Supratentorial leukomalacia likely on the basis of a moderate chronic small vessel ischemia.  ABN EKG : ACCELERATED JUNCTIONAL RHYTHM. NONSPECIFIC ST AND T WAVE ABNORMALITY.  On exam : tachycardia, + wheezes  In ED : 1g IV Rocephin, 500mg  po Zithromax, 50mg  po Ultram, 1g IV Mg xs 2, IV bolus , O2 3L NC  Admit : Acute hypoxic respiratory failure secondary to chronic obstructive pulmonary disease exacerbation, most likely secondary to community acquired pneumonia / Lactic acidosis    MD assessment/plan :  Acute hypoxic respiratory failure secondary to chronic obstructive pulmonary disease exacerbation, most likely secondary to community acquired pneumonia  Lactic acidosis : Resolved with IV fluids  Altered mental status secondary to above:  Resolved  Intermittent chest pain with Abnormal EKG  Polymyalgia rheumatica  Diabetes mellitus 2  Hypertension  Hypothyroidism  Gastroesophageal reflux disease  Obstructive sleep apnea  Plan  Admit to inpatient  Panculture. Check d-dimer stat ; check influenza   Strep throat culture pending   Start empirically on Rocephin and azithromycin IV steroids and around-the-clock nebulizers  Monitor on telemetry with serial troponin. Consult cardiology and pulmonology  Start low-dose Lantus as her blood sugars are anticipated with IV steroids; check hemoglobin A1c with insulin sliding scale  Patient states that her methotrexate has been discontinued a few weeks ago by her rheumatologist  We will switch Cardizem to short acting and IV metoprolol with holding parameters  Patient had a stress test with Anis last year and had an echo done last month  Check thyroid-stimulating hormone   Resume CPAP at night    Inpatient admission order 03/25/16, Tele, DuoNeb q4, 500mg  IV Zithromax q24, 1g IV Rocephin q24, po Cardizem, Colace, Cymbalta, SQ Lovenox 40mg  qd, po Zetia, Pepcid, Folic acid, Neurontin, Mucinex, HCTZ, SC Lantus, po Risaquad, Synthyroid, Losartan, 40mg  IV Solu medrol q8, po Metoprolol, Protonix, KDur, Trazodone, prn Tylenol, prn Xanax, prn Lactulose, prn IV Metoprolol, prn SL Nitro, prn IV Zofran, prn Miralax, prn Ultram, PULM consult, Cardio consult, diet as tol, I/O, VS q4  On tele 03/26/16 :  Still SOB, + non-productive cough    98.1, HR to 104-106, RR 21, BP 143/71, sats 91%  RBC 4.15, Glucose 187, K to 3.3 (from 3.6), BNP 101.3  ABGs : p02 art 61, HCO3 art 29.9    Cardio consult 03/26/16 :  Pt being treated for COPD exacerbation/pneumonia. Ruled out for myocardial infarction by negative set of cardiac markers. Last month, echocardiogram showed normal ejection  fraction with mild mitral and aortic regurgitation. No need to repeat echocardiogram during this admission. Stress test last year also showed no  ischemia. Once condition improves with COPD exacerbation treatment, can follow as an outpatient.    MD assessment/plan 03/26/16 :  Acute hypoxic respiratory failure secondary to chronic obstructive pulmonary disease exacerbation, most likely secondary to community acquired pneumonia:  Continue Rocephin and azithromycin IV steroids, around-the-clock nebulizers  Follow cultures including sputum culture  Appreciate pulmonology input  Lactic acidosis : Resolved with IV fluids  Altered mental status secondary to above: Resolved  Intermittent chest pain: Most likely secondary to above, resolved. Patient has been ruled out. Appreciate cardiology input  Polymyalgia rheumatica: Patient on low-dose of prednisone at home, which is currently being withheld as the patient is currently on IV steroids  Diabetes mellitus 2: Hemoglobin A1c 7.5  Blood sugars uncontrolled secondary to steroids  We will administer additional Lantus and continue high-dose sliding scale and monitor trends  Hypertension  Blood pressure stable  Hypothyroidism  Thyroid-stimulating hormone is normal. Continue Synthroid  Gastroesophageal reflux disease  Obstructive sleep apnea

## 2016-03-26 NOTE — Consults (Signed)
History reviewed and patient examined. See dictation.

## 2016-03-26 NOTE — PT Eval Note (Signed)
[facility]  [address]  [place], [place]. [address]    Department of Rehabilitation  [phone]    Physical Therapy Evaluation    Patient: Rachael [name]    MRN#: [id number]     M201/M201-A    Time of treatment: Time Calculation  PT Received On: 03/26/16  Start Time: 1310  Stop Time: 1345  Time Calculation (min): 35 min    PT Visit Number: 1    Consult received for Rachael [name] for PT Evaluation and Treatment.  Patient's medical condition is appropriate for Physical therapy intervention at this time.      Assessment:   Rachael [name] is a 80 y.o. female admitted 03/25/2016 presenting with COPD exacerbation, ARF.    Impairments: Assessment: Decreased endurance/activity tolerance;Decreased functional mobility.     Therapy Diagnosis: generalized weakness, decreased functional mobility  and decreased endurance/ activity engagement due to the above. Without therapy interventions, patient is at risk for falls, dependence on caregivers for mobility, dependence on caregivers for ADL's, decreased independence and failure to return to PLOF.    Rehabilitation Potential: Prognosis: Good;With continued PT status post acute discharge      Plan:    Treatment/Interventions: Exercise;Gait training;Stair training;LE strengthening/ROM;Endurance training;Equipment eval/education PT Frequency: 3-4x/wk    Risks/Benefits/POC Discussed with Pt/Family: With patient          Goals:   Goals  Goal Formulation: With patient  Time for Goal Acheivement: 5 visits  Goals: Select goal  Pt Will Transfer Bed/Chair: independent  Pt Will Ambulate: 101-150 feet;with single point cane (with proper pacing and no desat)  Pt Will Go Up / Down Stairs: 1 flight;with supervision;With [facility];With rail (pacing self w/o decr O2 sat)  Pt Will Perform Home Exer Program: independent;to maximize functional mobility and independence      Discharge Recommendations:   Based on today's session patient's discharge recommendation is the  following: Discharge Recommendation: Home with home health PT       Precautions  Other Precautions: falls    Medical Diagnosis: COPD exacerbation [J44.1]  Sepsis, due to unspecified organism [A41.9]  Altered mental status, unspecified altered mental status type [R41.82]    History of Present Illness: Rachael [name] is a 80 y.o. female admitted on 03/25/2016 after having had developed a sore throat, cough, and  shortness of breath 2 days prior to admission.  The patient was seen by her  primary medical doctor who prescribed supportive therapy and the patient  returned home; however, over the next 24 hours, the patient developed  worsening cough, shortness of breath, generalized weakness as well as a  sore throat and apparent altered mental status.  She was brought to the  emergency room at [facility] where she was noted to have a  temperature of 100.3 with a heart rate of 117 and wheezes in the right  lower and left lower lobe lung field as well as rales in the right lower  lobe and left lower lobe lung fields.  The patient was subsequently  admitted for further evaluation.  Of note, the patient's grandson was  recently ill and the patient was exposed to the grandson.  The patient's  cough was productive of light yellow sputum, but otherwise no discoloration  or blood was produced. (H&P obtained from physician note in chart).        Patient Active Problem List   Diagnosis   . Venous insufficiency of right leg   . Lymphedema of right lower extremity   .  Chronic cholecystitis with calculus   . COPD exacerbation   . Acute respiratory failure with hypoxia   . Lactic acidosis        Past Medical/Surgical History:  Past Medical History   Diagnosis Date   . Hypertension    . Chronic obstructive pulmonary disease    . Arthritis    . Polymyalgia rheumatica 09/2014   . Venous insufficiency of right leg 2016     also has lymphedema-KEEPS HER LEGS WRAPPED EXCEPT FOR WHEN SHE BATHES   . Lymphedema of right lower  extremity    . Type II or unspecified type diabetes mellitus without mention of complication, not stated as uncontrolled    . Gastroesophageal reflux disease    . Hypothyroidism    . Hyperlipidemia    . Type 2 diabetes mellitus, controlled    . Sleep apnea      USES CPAP NIGHTLY   . Hiatal hernia    . Low back pain    . 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 Anxiety    . Incontinent of urine 2014     -WEARS DIAPERS-WILL BRING HER OWN      Past Surgical History   Procedure Laterality Date   . Back surgery  2014   . Carpal tunnel release Bilateral 1978   . Hysterectomy  1985   . Robot assisted, laparoscopic, cholecystectomy N/A 10/31/2015     Procedure: ROBOT ASSISTED, LAPAROSCOPIC, CHOLECYSTECTOMY, UMBILICAL HERNIA REPAIR;  Surgeon: Alen Blew, MD;  Location: [facility];  Service: General;  Laterality: N/A;           Social History:  Prior Level of Function  Prior level of function:  (amb w/ AD outside, none inside)  Baseline Activity Level: Household ambulation, Community ambulation  Cooking: light meal prep  DME Currently at Home: Single point cane, Front wheel walker (looking into getting stair lift)  Home Living Arrangements  Living Arrangements: Children  Type of Home: House (in basement of dtrs home)  Home Layout: Two level (1 flight to basement)  DME Currently at Home: Single point cane, Front wheel walker (looking into getting stair lift)      Subjective:    Patient is agreeable to participation in the therapy session. Nursing clears patient for therapy.   Patient Goal  Patient Goal: to go home  Pain Assessment  Pain Assessment: No/denies pain    Objective:   Observation of Patient/Vital Signs:  Patient is in bed with telemetry and O2 at 3 liters/minute via nasal cannula in place.    Inspection/Posture  Inspection/Posture: mild-mod soft pedal edema    Cognition/Neuro Status  Arousal/Alertness: Appropriate responses to stimuli  Attention Span: Appears intact  Orientation  Level: Oriented X4  Memory: Appears intact  Following Commands: Follows all commands and directions without difficulty  Safety Awareness: independent  Insights: Fully aware of deficits;Educated in safety awareness  Problem Solving: Able to problem solve independently  Behavior: attentive;calm;cooperative  Motor Planning: intact  Coordination: intact  Hand Dominance: right handed      Hearing: WNL  Vision: wears glasses  Sensation: WNL, spotty neuropathy B soles of feet      Gross ROM  Neck/Trunk ROM: within functional limits  Right Upper Extremity ROM: within functional limits  Left Upper Extremity ROM: within functional limits  Right Lower Extremity ROM: within functional limits  Left Lower Extremity ROM: within functional limits  Gross Strength  Right Upper  Extremity Strength: 4+/5  Left Upper Extremity Strength: 4+/5  Right Lower Extremity Strength: 5/5  Left Lower Extremity Strength: 5/5  Tone  Tone: within functional limits    Functional Mobility  Supine to Sit: Supervision  Sit to Supine: Supervision  Sit to Stand: Stand by Assist  Stand to Sit: Supervision     Locomotion  Ambulation: Contact Guard Assist (no AD)  Ambulation Distance (Feet): 35 Feet  Pattern: decreased cadence;within functional limits (DOE)     Balance  Balance: within functional limits    Participation and Endurance  Participation Effort: excellent  Endurance: Tolerates < 10 min exercise with changes in vital signs (decr SpO2 to 93% on 3 L O2)         AM-PACT Inpatient Short Forms  Inpatient AM-PACT Performed? (PT): Basic Mobility Inpatient Short Form  AM-PACT "6 Clicks" Basic Mobility Inpatient Short Form  Turning Over in Bed: None  Sitting Down On/Standing From Armchair: None  Lying on Back to Sitting on Side of Bed: None  Assist Moving to/from Bed to Chair: None  Assist to Walk in Hospital Room: A little  Assist to Climb 3-5 Steps with Railing: A little  PT Basic Mobility Raw Score: 22  CMS 0-100% Score: 20.91%    Treatment Activities:  Educated pt to rise slowly to EOB, sit for several min and ensure no light headedness as well as once standing to ensure no light headedness before ambulating further. Pt denied light headedness at this time. Pt amb in room per above w/ 3 L O2 on. Pt then returned to EOB;. Educated on pursed lip breathing, (smell the flowers, blow out the candles), DB. Pt with + DOE. After seated rest break, pt educated on and performed: LAQ, seated marches, AP,hip ABD/ADD seated, shldr Flex, UE rows w/ scap retraction.x10, given h/o to continue throughout the day on her own. Pt returned to supine as she stated she was tired due to multiple tests this a.m. Call bell  In hand and pt understands not to get up w/o A.      Educated the patient to role of physical therapy, plan of care, goals of therapy and HEP, safety with mobility and ADLs, pursed lip breathing.      Zigmund Gottron, MSPT  [facility]  [phone]

## 2016-03-26 NOTE — Plan of Care (Signed)
Ask3Teach3 Program    Education about New Medications and their Side effects    Dear Rachael [name],    Its been a pleasure taking care of you during your hospitalization here at [facility]. We have initiated a new program to educate our patients and/or their family members or designated personnel about the new medications started by your physicians and their indications along with the possible side effects. Multiple studies have shown that patients started on new medications are often unaware of the names of the medication along with the indications and their side effects which leads to decreased compliance with the medications.    During our conversation today on 03/26/2016  10:24 AM I have explained to you the name of the new medication and the indication along with some possible common side effects. Listed below are some of the new medications started during this hospitalization.     Please call the Nurse if you have any side effects while in hospital.     Please call [phone] if you have any life threatening symptoms after you are discharged from the hospital.    Please inform your Primary care physician for common side effects which are not life threatening after discharge.      Medication: fluticasone/vilanterol (Breo)   This Medication is used for:   COPD or Asthma   Decreases airway constriction and inflammation    Common Side Effects are:   Headache   Sore throat    A note from your nurse:  Rinse your mouth with water and spit out after using.  Call your nurse immediately if you notice itching, hives, swelling or trouble breathing     Medication:Albuterol(Ventolin/Proventil)   This Medication is used for:   COPD   Asthma   Bronchospasm    Common Side Effects are:   Headache   Tremor   Nervousness   Sleeplessness    A note from your nurse:  Call your nurse immediately if you notice itching, hives, swelling or trouble breathing     Medication: Ipratropium(Atrovent)   This Medication  is used for:   COPD   Asthma   Bronchospasm    Common Side Effects are:   Dry mouth   Headache   Nausea   High heart rate    A note from your nurse:  Call your nurse immediately if you notice itching, hives, swelling or trouble breathing           Thank you for your time.    Lambert Keto, RT  03/26/2016  10:24 AM  [facility]  [address]  [place], [place]  [address]

## 2016-03-26 NOTE — Consults (Signed)
Service Date: 03/26/2016     Patient Type: I     CONSULTING PHYSICIAN: Elvera Lennox MD     REFERRING PHYSICIAN:      REASON FOR CONSULTATION:  Cough and shortness of breath.     HISTORY OF PRESENT ILLNESS:  This pleasant 80 year old female had developed a sore throat, cough, and  shortness of breath 2 days prior to admission.  The patient was seen by her  primary medical doctor who prescribed supportive therapy and the patient  returned home; however, over the next 24 hours, the patient developed  worsening cough, shortness of breath, generalized weakness as well as a  sore throat and apparent altered mental status.  She was brought to the  emergency room at [facility] where she was noted to have a  temperature of 100.3 with a heart rate of 117 and wheezes in the right  lower and left lower lobe lung field as well as rales in the right lower  lobe and left lower lobe lung fields.  The patient was subsequently  admitted for further evaluation.  Of note, the patient's grandson was  recently ill and the patient was exposed to the grandson.  The patient's  cough was productive of light yellow sputum, but otherwise no discoloration  or blood was produced.     MEDICATIONS:  Include albuterol, Xanax, calcium, vitamin D, Cardizem, Cymbalta, Folvite,  Neurontin, Synthroid, Hyzaar, Glucophage, Toprol, Protonix, potassium,  prednisone 5 mg, Symbicort, Ultram, trazodone, Zetia, and CPAP at 9 cm of  water.     ALLERGIES:  LATEX and TOLECTIN.     PAST MEDICAL HISTORY:  Notable for presumed COPD, although the patient does have normal airflows  in the office, severe obstructive sleep apnea on CPAP at 9 cm of water,  polymyalgia rheumatica on low-dose prednisone, ground-glass opacity seen on  a recent CT of the chest, migraine headaches, colitis, anxiety, and  hyperlipidemia, as well as presumed diabetes mellitus and depression,  hypertension, hypothyroidism.     PAST SURGICAL HISTORY:  Notable for a D and C, total  abdominal hysterectomy, laparoscopy and  cholecystectomy.     SOCIAL HISTORY:  The patient is married.  She smoked 1 pack of cigarettes daily for 25  years, which she discontinued in 1983.  She restarted around 2012 for a  year and then discontinued in 2013.  She drinks alcohol socially.     FAMILY HISTORY:  There is no family history of lung-related disorders.     PHYSICAL EXAMINATION:  VITAL SIGNS:  The patient's T-max was 100.3, now 98.1, pulse 103,  respiratory rate 20, blood pressure was 138/74.  The patient was lying  comfortably in bed in no acute distress.  NECK:  Supple.  No jugular venous distention noted.  CHEST:  Coarse expiratory rhonchi bilaterally.  HEART:  Normal S1, S2.  ABDOMEN:  Soft, bowel sounds present.  No organomegaly appreciated.  EXTREMITIES:  No cyanosis or clubbing.  There is nonpitting edema of the  lower extremities.     LABORATORY DATA:  On admission, white blood count was 10.3; hemoglobin 13.3; hematocrit 41.8;  platelet count 247,000 with 82 polys, 8 lymphocytes, 7 monocytes, 2  eosinophils, 1 basophil.  Chemistry shows sodium 139, potassium 3.6,  chloride 96, bicarbonate 30, BUN 11.5, creatinine 0.9, glucose 156.  Liver  function tests were normal.  TSH was normal at 4.45.  BNP was 100.7.   Lactic acid 2.4.  Urinalysis was unremarkable.  Hemoglobin A1c was  7.5.   The patient's chest x-ray showed questionable left basilar airspace  disease.  Otherwise, the lung fields were clear.  CT of the head showed  supratentorial leukomalacia secondary to chronic small vessel ischemic  changes.     CONCLUSION:  We have a 80 year old female who presents with bronchospasm, status post  viral illness.  The patient was screened for influenza on admission,  But the result has not returned.  The patient also had a negative Group A strep screen  of the throat.  We will request a nasal swab for the respiratory pathogen  panel a possible viral etiologic agent.  We  will also check a sputum for Gram stain  and culture.  If this is all viral  in origin we will be able to discontinue the antibiotic therapy.  We will  also obtain a followup chest x-ray to see if there is an evolution of a  left lower lobe infiltrate.  The patient is bronchospastic on exam.   Despite the patient's 25-pack-year cigarette smoking she has had normal  spirometry, except for obesity induced restriction. Therefore, this is more  likely asthma-like condition. I agree with IV corticosteroids and nebulized  bronchodilators.  I will continue the patient's nocturnal nasal  CPAP therapy at 9 cm of water for her OSAS. Once the patient is switched over to oral  prednisone therapy (when she improves) the patient should be titrated back to  her 5 mg of prednisone maintenance dose.     Thank you for the kind referral.           D:  03/26/2016 07:16 AM by Dr. Riley Lam A. Lanora Manis, MD [phone])  T:  03/26/2016 11:22 AM by Forde Radon      (Conf: [id number]) (Doc ID: [id number])

## 2016-03-26 NOTE — Consults (Signed)
[facility] CONSULTATION REPORT  [facility]    Date Time: 03/26/2016 9:08 AM  Patient Name: Rachael [name]  Requesting Physician: Anselmo Pickler, MD     Reason for Consultation:   CP   History:   [name] is a 80 y.o. female admitted on 03/25/2016, for whom I am asked to provide cardiac consultation by Anselmo Pickler, MD.   To patient with a history of hypertension, COPD, presented to the hospital  with shortness of breath, cough, and substernal chest pain for a few days.   Chest pain at worse was 6/10, worse with cough.  The shortness of breath  has been getting worse progressively, initially with exertion and then at  rest.  Had cough with white sputum, but denied any fever.  States that she  had sore throat for 5 days.  Stress test last year showed no ischemia and  echocardiogram last month showed normal ejection fraction with mild mitral  and aortic regurgitation.  Since admission, has been ruled out for  myocardial infarction by negative set of cardiac markers.  EKG showed sinus  tachycardia with nonspecific ST-T changes.  Currently, pain free.    Assessment:     Patient Active Problem List   Diagnosis   . Venous insufficiency of right leg   . Lymphedema of right lower extremity   . Chronic cholecystitis with calculus   . COPD exacerbation   . Acute respiratory failure with hypoxia   . Lactic acidosis   CP  Recommendations:   The patient with the above-mentioned medical problems here with shortness  of breath, cough, and atypical chest pain.  Being treated for COPD  exacerbation/pneumonia.  Ruled out for myocardial infarction by negative  set of cardiac markers.  Last month, echocardiogram showed normal ejection  fraction with mild mitral and aortic regurgitation.  No need to repeat  echocardiogram during this admission.  Stress test last year also showed no  ischemia.  Once condition improves with COPD exacerbation treatment, can  follow as an outpatient.  Call  with questions.    Physical Exam:   Patient Vitals for the past 12 hrs:   BP Temp Pulse Resp   03/26/16 0628 138/74 mmHg 98.1 F (36.7 C) (!) 103 20   03/26/16 0137 133/68 mmHg 98 F (36.7 C) (!) 104 20    79.379 kg (175 lb) 1.524 m (5')   Temp (24hrs), Avg:98.4 F (36.9 C), Min:97.5 F (36.4 C), Max:100.3 F (37.9 C)    Intake and Output Summary (Last 24 hours) at Date Time    Intake/Output Summary (Last 24 hours) at 03/26/16 0908  Last data filed at 03/26/16 0100   Gross per 24 hour   Intake    400 ml   Output      0 ml   Net    400 ml       GENERAL: Patient is in no acute distress   HEENT: No scleral icterus or conjunctival pallor, moist mucous membranes   NECK: No jugular venous distention or thyromegaly  CARDIAC: S1 & S2 regular, no murmurs or gallops   CHEST: rhonchi b/l, normal respiratory effort  ABDOMEN:  Nontender, non-distended, good bowel sounds, no masses  EXTREMITIES: No clubbing, cyanosis, +edema   SKIN: No rash or jaundice   NEUROLOGIC: Alert and oriented to time, place and person, normal mood and affect     Past Medical History:     Past Medical History   Diagnosis  Date   . Hypertension    . Chronic obstructive pulmonary disease    . Arthritis    . Polymyalgia rheumatica 09/2014   . Venous insufficiency of right leg 2016     also has lymphedema-KEEPS HER LEGS WRAPPED EXCEPT FOR WHEN SHE BATHES   . Lymphedema of right lower extremity    . Type II or unspecified type diabetes mellitus without mention of complication, not stated as uncontrolled    . Gastroesophageal reflux disease    . Hypothyroidism    . Hyperlipidemia    . Type 2 diabetes mellitus, controlled    . Sleep apnea      USES CPAP NIGHTLY   . Hiatal hernia    . Low back pain    . 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 Anxiety    . Incontinent of urine 2014     -WEARS DIAPERS-WILL BRING HER OWN     Past Surgical History:     Past Surgical History   Procedure Laterality Date   . Back surgery  2014   . Carpal tunnel  release Bilateral 1978   . Hysterectomy  1985   . Robot assisted, laparoscopic, cholecystectomy N/A 10/31/2015     Procedure: ROBOT ASSISTED, LAPAROSCOPIC, CHOLECYSTECTOMY, UMBILICAL HERNIA REPAIR;  Surgeon: Alen Blew, MD;  Location: [facility];  Service: General;  Laterality: N/A;     Family History:   History reviewed. No pertinent family history.  Social History:     Social History     Social History   . Marital Status: Widowed     Spouse Name: N/A   . Number of Children: N/A   . Years of Education: N/A     Social History Main Topics   . Smoking status: Former Smoker -- 1.00 packs/day for 25 years     Quit date: 11/16/1998   . Smokeless tobacco: Never Used   . Alcohol Use: 1.8 oz/week     3 Glasses of wine per week      Comment: social   . Drug Use: No   . Sexual Activity: Not on file     Other Topics Concern   . Not on file     Social History Narrative     Allergies:     Allergies   Allergen Reactions   . Tolectin [Tolmetin] Swelling   . Latex Itching     Medications:     Prescriptions prior to admission   Medication Sig   . albuterol (PROVENTIL) (2.5 MG/3ML) 0.083% nebulizer solution TAKE (2.5MG ) BY NEBULIZATION EVERY 4 HOURS AS NEEDED FOR WHEEZING OR SHORTNESS OF BREATH (COUGH)   . ALPRAZolam (XANAX) 0.5 MG tablet TAKE 1 TABLET BY MOUTH EVERY OTHER DAY AS NEEDED   . Calcium Carbonate-Vit D-Min (CALCIUM 1200 PO) Take by mouth.   . diltiazem (CARDIZEM CD) 240 MG 24 hr capsule Take 240 mg by mouth daily.   . DULoxetine (CYMBALTA) 60 MG capsule Take 60 mg by mouth 2 (two) times daily.   . folic acid (FOLVITE) 1 MG tablet Take 1 mg by mouth daily.      Marland Kitchen gabapentin (NEURONTIN) 300 MG capsule Take 300 mg by mouth 2 (two) times daily.   Marland Kitchen levothyroxine (SYNTHROID, LEVOTHROID) 50 MCG tablet Take 50 mcg by mouth Once a day at 6:00am.   . losartan-hydrochlorothiazide (HYZAAR) 100-25 MG per tablet Take 1 tablet by mouth every morning.   Marland Kitchen  metFORMIN (GLUCOPHAGE-XR) 500 MG 24 hr tablet Take 500 mg  by mouth nightly.      . metoprolol XL (TOPROL-XL) 50 MG 24 hr tablet Take 50 mg by mouth daily.   . pantoprazole (PROTONIX) 40 MG tablet Take 40 mg by mouth daily.      . potassium chloride (K-TAB,KLOR-CON) 10 MEQ tablet Take 10 mEq by mouth daily.      . predniSONE (DELTASONE) 5 MG tablet Take 4 mg by mouth daily.      . SYMBICORT 160-4.5 MCG/ACT inhaler Inhale 2 puffs into the lungs 2 (two) times daily.      . traMADol (ULTRAM) 50 MG tablet TAKE 1 TABLET BY MOUTH TWICE DAILY AS NEEDED FOR PAIN   . traZODone (DESYREL) 100 MG tablet Take 100 mg by mouth nightly.   Marland Kitchen ZETIA 10 MG tablet Take 10 mg by mouth daily.         Review of Systems:   Comprehensive review of systems including constitutional, eyes, ears, nose, mouth, throat, cardiovascular, GI, GU, musculoskeletal, integumentary, respiratory, neurologic, psychiatric, and endocrine is negative other than what is mentioned already in the history of present illness.    Labs Reviewed:   Recent CMP   Recent Labs      03/26/16   0108  03/25/16   1252   GLUCOSE  187*  156*   BUN  9.0  11.5   CREATININE  0.7  0.9   SODIUM  139  139   POTASSIUM  3.3*  3.6   CO2  29  30*   CALCIUM  8.8  9.9   ALBUMIN   --   3.7   AST (SGOT)   --   15   ALT   --   12   GLOBULIN   --   3.1     Recent CBC WITH DIFF Recent Labs      03/26/16   0108   RBC  4.15*   HGB  12.0   HEMATOCRIT  37.8   MCV  91.1   MCHC  31.7*   RDW  15   MPV  8.8*   PLATELETS  236     Radiology:   Radiological Procedure reviewed.    chest X-ray      Signed by: Jeffery Bachmeier August Luz, RPVI  03/26/2016, 9:08 AM  [facility]  [phone]

## 2016-03-27 LAB — CBC AND DIFFERENTIAL
Hematocrit: 37 % (ref 37.0–47.0)
Hgb: 11.7 g/dL — ABNORMAL LOW (ref 12.0–16.0)
MCH: 28.5 pg (ref 28.0–32.0)
MCHC: 31.6 g/dL — ABNORMAL LOW (ref 32.0–36.0)
MCV: 90.2 fL (ref 80.0–100.0)
MPV: 8.9 fL — ABNORMAL LOW (ref 9.4–12.3)
Platelets: 261 10*3/uL (ref 140–400)
RBC: 4.1 10*6/uL — ABNORMAL LOW (ref 4.20–5.40)
RDW: 15 % (ref 12–15)
WBC: 11.89 10*3/uL — ABNORMAL HIGH (ref 3.50–10.80)

## 2016-03-27 LAB — MAN DIFF ONLY
Band Neutrophils Absolute: 1.9 10*3/uL — ABNORMAL HIGH (ref 0.00–1.00)
Band Neutrophils: 16 %
Basophils Absolute Manual: 0 10*3/uL (ref 0.00–0.20)
Basophils Manual: 0 %
Eosinophils Absolute Manual: 0 10*3/uL (ref 0.00–0.70)
Eosinophils Manual: 0 %
Lymphocytes Absolute Manual: 0.48 10*3/uL — ABNORMAL LOW (ref 0.50–4.40)
Lymphocytes Manual: 4 %
Monocytes Absolute: 0.48 10*3/uL (ref 0.00–1.20)
Monocytes Manual: 4 %
Neutrophils Absolute Manual: 9.04 10*3/uL — ABNORMAL HIGH (ref 1.80–8.10)
Nucleated RBC: 0 /100 WBC (ref 0–1)
Segmented Neutrophils: 76 %

## 2016-03-27 LAB — CELL MORPHOLOGY
Cell Morphology: NORMAL
Platelet Estimate: NORMAL

## 2016-03-27 LAB — GLUCOSE WHOLE BLOOD - POCT
Whole Blood Glucose POCT: 278 mg/dL — ABNORMAL HIGH (ref 70–100)
Whole Blood Glucose POCT: 310 mg/dL — ABNORMAL HIGH (ref 70–100)
Whole Blood Glucose POCT: 276 mg/dL — ABNORMAL HIGH (ref 70–100)
Whole Blood Glucose POCT: 219 mg/dL — ABNORMAL HIGH (ref 70–100)

## 2016-03-27 LAB — BASIC METABOLIC PANEL
Anion Gap: 10 (ref 5.0–15.0)
BUN: 15.5 mg/dL (ref 7.0–19.0)
CO2: 25 mEq/L (ref 22–29)
Calcium: 9 mg/dL (ref 7.9–10.2)
Chloride: 104 mEq/L (ref 100–111)
Creatinine: 0.8 mg/dL (ref 0.6–1.0)
Glucose: 289 mg/dL — ABNORMAL HIGH (ref 70–100)
Potassium: 4 mEq/L (ref 3.5–5.1)
Sodium: 139 mEq/L (ref 136–145)

## 2016-03-27 LAB — GFR: EGFR: >60

## 2016-03-27 MED ORDER — METHYLPREDNISOLONE SODIUM SUCC 40 MG IJ SOLR
40.0000 mg | Freq: Two times a day (BID) | INTRAMUSCULAR | Status: DC
Start: 2016-03-27 — End: 2016-03-29
  Administered 2016-03-27 – 2016-03-29 (×4): 40 mg via INTRAVENOUS
  Filled 2016-03-27 (×4): qty 1

## 2016-03-27 MED ORDER — GUAIFENESIN-DM 100-10 MG/5ML PO SYRP
10.0000 mL | ORAL_SOLUTION | Freq: Four times a day (QID) | ORAL | Status: DC | PRN
Start: 2016-03-27 — End: 2016-03-27
  Administered 2016-03-27: 10 mL via ORAL
  Filled 2016-03-27 (×2): qty 10

## 2016-03-27 MED ORDER — ACETYLCYSTEINE 10 % IN SOLN
3.0000 mL | Freq: Three times a day (TID) | RESPIRATORY_TRACT | Status: AC
Start: 2016-03-27 — End: 2016-03-29
  Administered 2016-03-27 – 2016-03-28 (×4): 3 mL via RESPIRATORY_TRACT
  Filled 2016-03-27 (×4): qty 4

## 2016-03-27 MED ORDER — INSULIN GLARGINE 100 UNIT/ML SC SOLN
8.0000 [IU] | Freq: Once | SUBCUTANEOUS | Status: AC
Start: 2016-03-27 — End: 2016-03-27
  Administered 2016-03-27: 8 [IU] via SUBCUTANEOUS
  Filled 2016-03-27: qty 8

## 2016-03-27 MED ORDER — INSULIN GLARGINE 100 UNIT/ML SC SOLN
12.0000 [IU] | Freq: Every evening | SUBCUTANEOUS | Status: DC
Start: 2016-03-27 — End: 2016-03-29
  Administered 2016-03-27 – 2016-03-28 (×2): 12 [IU] via SUBCUTANEOUS
  Filled 2016-03-27 (×2): qty 12

## 2016-03-27 MED ORDER — HYDROCODONE-HOMATROPINE 5-1.5 MG/5ML PO SYRP
5.0000 mL | ORAL_SOLUTION | ORAL | Status: DC | PRN
Start: 2016-03-27 — End: 2016-04-01
  Administered 2016-03-27 (×2): 5 mL via ORAL
  Filled 2016-03-27 (×2): qty 5

## 2016-03-27 MED ORDER — INSULIN GLARGINE 100 UNIT/ML SC SOLN
15.0000 [IU] | Freq: Every evening | SUBCUTANEOUS | Status: DC
Start: 2016-03-27 — End: 2016-03-27

## 2016-03-27 MED ORDER — BENZOCAINE-MENTHOL 15-3.6 MG MT LOZG
1.0000 | LOZENGE | OROMUCOSAL | Status: DC | PRN
Start: 2016-03-27 — End: 2016-04-01
  Administered 2016-03-27 (×2): 1 via BUCCAL
  Filled 2016-03-27: qty 2
  Filled 2016-03-27: qty 1

## 2016-03-27 NOTE — PT Progress Note (Signed)
Physical Therapy Cancellation Note    Patient: Rachael [name]  ZOX:[id number]    Unit: M201/M201-A    Patient not seen for physical therapy secondary to pt not available.  Will follow.

## 2016-03-27 NOTE — Progress Notes (Signed)
[facility]  PROGRESS NOTE  [facility]    Date Time: 03/27/2016 8:59 AM  Patient Name: Rachael [name]     Assessment:     Patient Active Problem List   Diagnosis   . Venous insufficiency of right leg   . Lymphedema of right lower extremity   . Chronic cholecystitis with calculus   . COPD exacerbation   . Acute respiratory failure with hypoxia   . Lactic acidosis   CP   Recommendations:   Ruled out for MI. Being treated for bronchospasm/COPD exacerbation.  Stress test 2016 showed no ischemia.  Echo last month showed normal EF and mild AR and MR.  No further cardiac recommendations at this time.  F/u in office in 1 month.  Call with questions, will sign off.    Medications:     Current Facility-Administered Medications   Medication Dose Route Frequency   . acetylcysteine  3 mL Nebulization Q8H SCH   . albuterol-ipratropium  3 mL Nebulization Q4H WA   . azithromycin  500 mg Intravenous Q24H SCH   . cefTRIAXone  1 g Intravenous Q24H SCH   . dilTIAZem  240 mg Oral Daily   . docusate sodium  100 mg Oral Q12H   . DULoxetine  60 mg Oral BID   . enoxaparin  40 mg Subcutaneous Daily   . ezetimibe  10 mg Oral Daily   . famotidine  20 mg Oral Daily   . fluticasone furoate-vilanterol  1 puff Inhalation QAM   . folic acid  1 mg Oral Daily   . gabapentin  300 mg Oral BID   . guaiFENesin  600 mg Oral Q12H SCH   . losartan  100 mg Oral QAM    And   . hydroCHLOROthiazide  25 mg Oral QAM   . insulin glargine  12 Units Subcutaneous QHS   . insulin glargine  8 Units Subcutaneous Once   . lactobacillus/streptococcus  1 capsule Oral Daily   . levothyroxine  50 mcg Oral Daily at 0600   . methylPREDNISolone  40 mg Intravenous Q12H   . metoprolol XL  50 mg Oral Daily   . pantoprazole  40 mg Oral Daily   . traZODone  100 mg Oral QHS       Subjective:   Denies chest pain, SOB or palpitations.    Physical Exam:   Patient Vitals for the past 12 hrs:   BP Temp Pulse Resp   03/27/16 0531 122/71 mmHg 98.1 F (36.7  C) 98 20   03/27/16 0151 135/71 mmHg 97.9 F (36.6 C) 98 20   03/26/16 2151 127/73 mmHg 98.1 F (36.7 C) 81 20    82.872 kg (182 lb 11.2 oz) 1.524 m (5')   Temp (24hrs), Avg:97.6 F (36.4 C), Min:97 F (36.1 C), Max:98.1 F (36.7 C)    Telemetry reviewed no changes.   Intake and Output Summary (Last 24 hours) at Date Time    Intake/Output Summary (Last 24 hours) at 03/27/16 0859  Last data filed at 03/27/16 0200   Gross per 24 hour   Intake    425 ml   Output      0 ml   Net    425 ml       General Appearance:  Breathing comfortable, no acute distress  Head:  normocephalic  Neck:  No carotid bruit or jugular venous distension  Lungs:  +wheezes  Heart:  S1, S2 regular, no S3, no S4,  no murmur  Abdomen:  Soft, non-tender, positive bowel sounds  Extremities:  No cyanosis, clubbing or edema  Neurologic:  Alert and oriented x3    Labs:     Recent Labs  Lab 03/26/16  0928 03/26/16  0108 03/25/16  2143   TROPONIN I <0.01 0.02 <0.01           Recent Labs  Lab 03/25/16  1252   PT 12.5*   PT INR 0.9   PTT 31       Recent Labs  Lab 03/27/16  0608 03/26/16  0108 03/25/16  1252   WBC 11.89* 7.94 10.28   HGB 11.7* 12.0 13.3   HEMATOCRIT 37.0 37.8 41.8   PLATELETS 261 236 247       Recent Labs  Lab 03/27/16  0608 03/26/16  0108 03/25/16  1252   SODIUM 139 139 139   POTASSIUM 4.0 3.3* 3.6   CHLORIDE 104 100 96*   CO2 25 29 30*   BUN 15.5 9.0 11.5   CREATININE 0.8 0.7 0.9   EGFR >60.0 >60.0 >60.0   GLUCOSE 289* 187* 156*   CALCIUM 9.0 8.8 9.9     .  Lab Results   Component Value Date    BNP 101.3* 03/26/2016      Imaging:   Radiological Procedure reviewed.      Signed by: Mikale Silversmith,MD, FACC  03/27/2016, 8:59 AM  [facility]  [phone]

## 2016-03-27 NOTE — Plan of Care (Signed)
Problem: Safety  Goal: Patient will be free from injury during hospitalization  Outcome: Progressing  .  Intervention: Assess patient's risk for falls and implement fall prevention plan of care per policy  .  Intervention: Provide and maintain safe environment  .  Intervention: Hourly rounding.  .

## 2016-03-27 NOTE — Progress Notes (Signed)
Pulmonary Progress Note  "[facility]"    Date Time: 03/27/2016 7:32 AM  Patient Name: Rachael [name]  Attending Physician: Anselmo Pickler, MD      Assessment:     Patient Active Problem List   Diagnosis       .    Marland Kitchen Bronchospasm: the patient has viral induced bronchospasm. At baseline, the patient has normal airflows. I suspect the the persistent wheezing likely reflects underlying bronchomalacia. Will add mucomyst to nebs and check a sputum culture. The repeat CXR is clear. The respiratory pathogen panel is still pending. Patient was encouraged to ambulate. Will wean IV steroids.   . OSAS: on nasal CPAP @ 9 cm H2O   . Peripheral Edema: place Ted stockings   . PMR: on chronic low dose prednisone           Plan:   See above      Interval History   Interval History/24 hr. Events: feels better but still has coughing spasms    Physical Exam:   Vital signs for last 24 hours:  Temp:  [97 F (36.1 C)-98.1 F (36.7 C)] 98.1 F (36.7 C)  Heart Rate:  [81-106] 98  Resp Rate:  [20-22] 20  BP: (122-143)/(63-73) 122/71 mmHg    Intake and Output Summary (Last 24 hours) at Date Time    Intake/Output Summary (Last 24 hours) at 03/27/16 0732  Last data filed at 03/27/16 0200   Gross per 24 hour   Intake    425 ml   Output      0 ml   Net    425 ml       General: awake, alert, oriented x 3; no acute distress; able to speak in complete sentences without accessory muscle use; in   HEENT: pupils equal with EOMI; no thyromegaly, no cervical LN, no thrush.  Cardiovascular: regular rate and rhythm, no murmurs, rubs or gallops. No P2/S2 present. No R sided heave. No JVD, no HJR.  Lungs: bilateral wheezing lower lung fields, rhonchi, or rales.  Abdomen: soft, non-tender, non-distended; no palpable masses, no hepatosplenomegaly, normoactive bowel sounds, no rebound or guarding  Extremities: no clubbing, cyanosis, + non-pitting edema.  Neuro: Normal sensory and motor systems. Able to ambulate.  Derm:  No rashes.  Musculoskeletal: nl ROM, no muscle weakness.      Meds:   Scheduled Meds:  Current Facility-Administered Medications   Medication Dose Route Frequency   . acetylcysteine  3 mL Nebulization Q8H SCH   . albuterol-ipratropium  3 mL Nebulization Q4H WA   . azithromycin  500 mg Intravenous Q24H SCH   . cefTRIAXone  1 g Intravenous Q24H SCH   . dilTIAZem  240 mg Oral Daily   . docusate sodium  100 mg Oral Q12H   . DULoxetine  60 mg Oral BID   . enoxaparin  40 mg Subcutaneous Daily   . ezetimibe  10 mg Oral Daily   . famotidine  20 mg Oral Daily   . fluticasone furoate-vilanterol  1 puff Inhalation QAM   . folic acid  1 mg Oral Daily   . gabapentin  300 mg Oral BID   . guaiFENesin  600 mg Oral Q12H SCH   . losartan  100 mg Oral QAM    And   . hydroCHLOROthiazide  25 mg Oral QAM   . insulin glargine  12 Units Subcutaneous QHS   . lactobacillus/streptococcus  1 capsule Oral Daily   . levothyroxine  50 mcg Oral Daily at 0600   . methylPREDNISolone  40 mg Intravenous Q12H   . metoprolol XL  50 mg Oral Daily   . pantoprazole  40 mg Oral Daily   . traZODone  100 mg Oral QHS     Continuous Infusions:   PRN Meds:.acetaminophen **OR** acetaminophen, ALPRAZolam, Nursing communication: Adult Hypoglycemia Treatment Algorithm **AND** dextrose **AND** dextrose **AND** glucagon (rDNA), HYDROcodone-homatropine, insulin lispro, insulin lispro, lactulose, metoprolol, naloxone, nitroglycerin, ondansetron, polyethylene glycol, traMADol      Labs:     Results     Procedure Component Value Units Date/Time    CBC with differential [[id number]]  (Abnormal) Collected:  03/27/16 0608    Specimen Information:  Blood from Blood Updated:  03/27/16 0713     WBC 11.89 (H) x10 3/uL      Hgb 11.7 (L) g/dL      Hematocrit 40.9 %      Platelets 261 x10 3/uL      RBC 4.10 (L) x10 6/uL      MCV 90.2 fL      MCH 28.5 pg      MCHC 31.6 (L) g/dL      RDW 15 %      MPV 8.9 (L) fL     Manual Differential [[id number]]  (Abnormal) Collected:  03/27/16 0608      Segmented Neutrophils 76 % Updated:  03/27/16 0713     Band Neutrophils 16 %      Lymphocytes Manual 4 %      Monocytes Manual 4 %      Eosinophils Manual 0 %      Basophils Manual 0 %      Nucleated RBC 0 /100 WBC      Abs Seg Manual 9.04 (H) x10 3/uL      Bands Absolute 1.90 (H) x10 3/uL      Absolute Lymph Manual 0.48 (L) x10 3/uL      Monocytes Absolute 0.48 x10 3/uL      Absolute Eos Manual 0.00 x10 3/uL      Absolute Baso Manual 0.00 x10 3/uL     Cell MorpHology [[id number]] Collected:  03/27/16 0608     Cell Morphology: Normal Updated:  03/27/16 0713     Platelet Estimate Normal     Basic Metabolic Panel [[id number]]  (Abnormal) Collected:  03/27/16 0608    Specimen Information:  Blood Updated:  03/27/16 0701     Glucose 289 (H) mg/dL      BUN 52.8 mg/dL      Creatinine 0.8 mg/dL      Calcium 9.0 mg/dL      Sodium 413 mEq/L      Potassium 4.0 mEq/L      Chloride 104 mEq/L      CO2 25 mEq/L      Anion Gap 10.0     GFR [[id number]] Collected:  03/27/16 0608     EGFR >60.0 Updated:  03/27/16 0701    Glucose Whole Blood - POCT [[id number]]  (Abnormal) Collected:  03/26/16 2018     POCT - Glucose Whole blood 250 (H) mg/dL Updated:  74/25/95 6387    CULTURE BLOOD AEROBIC AND ANAEROBIC #2 [[id number]] Collected:  03/25/16 1500    Specimen Information:  Arm from Blood Updated:  03/26/16 2021    Narrative:      ORDER#: [id number]  ORDERED BY: SHIFFERT, KENT  SOURCE: Blood right ac                               COLLECTED:  03/25/16 15:00  ANTIBIOTICS AT COLL.:                                RECEIVED :  03/25/16 19:40  Culture Blood Aerobic and Anaerobic        PRELIM      03/26/16 20:21  03/26/16   No Growth after 1 day/s of incubation.      CULTURE BLOOD AEROBIC AND ANAEROBIC #1 [[id number]] Collected:  03/25/16 1252    Specimen Information:  Arm from Blood Updated:  03/26/16 1821    Narrative:      ORDER#: [id number]                                    ORDERED BY: SHIFFERT, KENT  SOURCE:  Blood LEFT AC                                COLLECTED:  03/25/16 12:52  ANTIBIOTICS AT COLL.:                                RECEIVED :  03/25/16 17:53  Culture Blood Aerobic and Anaerobic        PRELIM      03/26/16 18:21  03/26/16   No Growth after 1 day/s of incubation.      Glucose Whole Blood - POCT [[id number]]  (Abnormal) Collected:  03/26/16 1558     POCT - Glucose Whole blood 361 (H) mg/dL Updated:  96/29/52 8413    Throat Culture [[id number]] Collected:  03/25/16 1252    Specimen Information:  Throat Updated:  03/26/16 1422    Narrative:      ORDER#: [id number]                                    ORDERED BY: SHIFFERT, KENT  SOURCE: Throat                                       COLLECTED:  03/25/16 12:52  ANTIBIOTICS AT COLL.:                                RECEIVED :  03/25/16 16:57  Throat Culture                             PRELIM      03/26/16 14:22  03/26/16   Culture in progress, final to follow      Procalcitonin [[id number]] Collected:  03/25/16 1252     Procalcitonin <0.1 Updated:  03/26/16 1417    Glucose Whole Blood - POCT [[id number]]  (Abnormal) Collected:  03/26/16 1111     POCT - Glucose Whole blood 381 (H) mg/dL Updated:  18/84/16 6063  Troponin I [[id number]] Collected:  03/26/16 0928    Specimen Information:  Blood Updated:  03/26/16 1013     Troponin I <0.01 ng/mL     Arterial Blood Gas [[id number]]  (Abnormal) Collected:  03/26/16 0848     Status Oxygen Updated:  03/26/16 0859     ABG CollectionSite Right Radl      Allen's Test Yes      Temperature 98.6      pH, Arterial 7.43      pCO2, Arterial 45.0 mmhg      pO2, Arterial 61.0 (L) mmhg      HCO3, Arterial 29.9 (H) mEq/L      Base Excess, Arterial 4.8 (H) mEq/L      O2 Sat, Arterial 92.0 (L) %      O2 Flow 4.0 L/min     Rapid influenza A/B antigens [[id number]] Collected:  03/26/16 0865    Specimen Information:  Nasopharyngeal from Nasal Aspirate Updated:  03/26/16 0808    Narrative:      ORDER#: [id number]                                     ORDERED BY: Nolon Bussing  SOURCE: Nasal Aspirate                               COLLECTED:  03/26/16 07:28  ANTIBIOTICS AT COLL.:                                RECEIVED :  03/26/16 07:50  Influenza Rapid Antigen A&B                FINAL       03/26/16 08:08  03/26/16   Negative for Influenza A and B             Reference Range: Negative              Radiology:     Radiology Results (24 Hour)     Procedure Component Value Units Date/Time    XR Chest 2 Views [[id number]] Collected:  03/26/16 0906    Order Status:  Completed Updated:  03/26/16 0912    Narrative:      HISTORY: Pneumonia follow-up. Left basilar atelectasis noted on recent  chest exam.    COMPARISON: Chest radiograph 03/25/2016 and chest CT/10/2016.    TECHNIQUE: AP portable and lateral projections of the chest.    FINDINGS:    Cardiac size and mediastinal contours are normal. Pulmonary vascularity  is normal, and the lungs are clear. There is no pneumothorax or pleural  effusion. No acute osseous abnormality is identified.      Impression:        1. Clear lungs.  2. Normal heart size.    Elizebeth Koller, MD   03/26/2016 9:08 AM              Signed by: Elvera Lennox, MD  Date/Time: 03/27/2016 7:32 AM

## 2016-03-27 NOTE — OT Eval Note (Signed)
[facility]  [address]  [place], [place]. [address]    Department of Rehabilitation Services  [phone]    Occupational Therapy Evaluation    Patient: Rachael [name]    MRN#: [id number]     M201/M201-A    Time of treatment: Time Calculation  OT Received On: 03/27/16  Start Time: 1036  Stop Time: 1110  Then returned from 16:30 to 16:36   Time Calculation (min): 34 min  Total Treatment Time (min): 40  OT Visit Number: 1    Consult received for Rachael [name] for OT Evaluation and Treatment.  Patient's medical condition is appropriate for Occupational therapy intervention at this time.    Assessment:   .Rachael [name] is a 80 y.o. female admitted 03/25/2016.   Brief moderate review completed including review of labs, review of imaging, review of vitals and review of consulting physician notes .  Pt's ability to complete ADLs and functional transfers is impaired due to the following deficits:  decreased activity tolerance and decreased balance.  Pt demonstrates performance deficits with grooming, bathing, dressing, toileting and functional mobility. There are a few comorbidities or other factors that affect plan of care and require modification of task including: oxygen dependency, has stairs to manage and home alone for a portion of the day.  Pt would continue to benefit from OT to address these deficits and increase functional independence.    Assessment: decreased strength;balance deficits;decreased independence with ADLs;decreased safety awareness;decreased endurance/activity tolerance;decreased independence with IADLs     Complexity Chart Review Performance Deficits Clinical Decision Making Hx/Comorbidities Assistance needed   Low Brief 1-3 Limited options None None (or at baseline)   Moderate Expanded 3-5 Several Options 1-2 Min/Mod assist (not at baseline)   High Extensive 5 or more Multiple options 3 or more Max/dependent (not at baseline     Therapy Diagnosis: generalized  weakness, decreased functional mobility and decreased independence with ADL's due to COPD Exacerbation . Without therapy interventions, patient is at risk for falls and decreased independence.    Rehabilitation Potential: Prognosis: Good;With continued OT s/p acute discharge      Plan:   OT Frequency Recommended: 3-4x/wk   Treatment Interventions: ADL retraining;Functional transfer training;UE strengthening/ROM;Endurance training;Patient/Family training;Neuro muscular reeducation     Patient Goal  Patient Goal:  (to go home)    Risks/Benefits/POC Discussed with Pt/Family: With patient/family    Goals:   Goal Formulation: Patient;Family  Time For Goal Achievement: by time of discharge  ADL Goals  Patient will groom self: Supervision;at sinkside;5 visits  Patient will dress lower body: Supervision;5 visits;with AE  Patient will toilet: Supervision;5 visits  Mobility and Transfer Goals  Pt will transfer bed to toilet: Supervision;5 visits     Musculoskeletal Goals  Pt will perform Home Exercise Program: independent;to increase engagement in ADLs;5 visits                   Discharge Recommendations:   Based on today's session patient's discharge recommendation is the following: Discharge Recommendation: Home with supervision;Home with home health OT.               Precautions and Contraindications: falls Risk          Medical Diagnosis: COPD exacerbation [J44.1]  Sepsis, due to unspecified organism [A41.9]  Altered mental status, unspecified altered mental status type [R41.82]    History of Present Illness: Rachael [name] is a 80 y.o. female admitted on 03/25/2016 with "having had developed a sore throat,  cough, and  shortness of breath 2 days prior to admission.  The patient was seen by her  primary medical doctor who prescribed supportive therapy and the patient  returned home; however, over the next 24 hours, the patient developed  worsening cough, shortness of breath, generalized weakness as well as a  sore  throat and apparent altered mental status.  She was brought to the  emergency room at [facility] where she was noted to have a  temperature of 100.3 with a heart rate of 117 and wheezes in the right  lower and left lower lobe lung field as well as rales in the right lower  lobe and left lower lobe lung fields.  The patient was subsequently  admitted for further evaluation.  Of note, the patient's grandson was  recently ill and the patient was exposed to the grandson.  The patient's  cough was productive of light yellow sputum, but otherwise no discoloration  or blood was produced."--as per H & P note.       Patient Active Problem List   Diagnosis   . Venous insufficiency of right leg   . Lymphedema of right lower extremity   . Chronic cholecystitis with calculus   . COPD exacerbation   . Acute respiratory failure with hypoxia   . Lactic acidosis        Past Medical/Surgical History:  Past Medical History   Diagnosis Date   . Hypertension    . Chronic obstructive pulmonary disease    . Arthritis    . Polymyalgia rheumatica 09/2014   . Venous insufficiency of right leg 2016     also has lymphedema-KEEPS HER LEGS WRAPPED EXCEPT FOR WHEN SHE BATHES   . Lymphedema of right lower extremity    . Type II or unspecified type diabetes mellitus without mention of complication, not stated as uncontrolled    . Gastroesophageal reflux disease    . Hypothyroidism    . Hyperlipidemia    . Type 2 diabetes mellitus, controlled    . Sleep apnea      USES CPAP NIGHTLY   . Hiatal hernia    . Low back pain    . 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 Anxiety    . Incontinent of urine 2014     -WEARS DIAPERS-WILL BRING HER OWN      Past Surgical History   Procedure Laterality Date   . Back surgery  2014   . Carpal tunnel release Bilateral 1978   . Hysterectomy  1985   . Robot assisted, laparoscopic, cholecystectomy N/A 10/31/2015     Procedure: ROBOT ASSISTED, LAPAROSCOPIC, CHOLECYSTECTOMY, UMBILICAL HERNIA  REPAIR;  Surgeon: Alen Blew, MD;  Location: [facility];  Service: General;  Laterality: N/A;         Social History:  Prior Level of Function  Prior level of function:  (amb w/ AD outside, none inside)  Baseline Activity Level: Household ambulation, Community ambulation  Cooking: light meal prep  DME Currently at Home: Single point cane, Front wheel walker (looking into getting stair lift)  Home Living Arrangements  Living Arrangements: Children  Type of Home: House (in basement of dtrs home)  Home Layout: Two level (1 flight to basement)  DME Currently at Home: Single point cane, Front wheel walker (looking into getting stair lift)      Subjective:   Patient is agreeable to participation in the therapy session.  Family and/or guardian are agreeable to patient's participation in the therapy session. Nursing clears patient for therapy.  Subjective:  (Patient denies pain currently) Patient's daughter present for duration of session and provided with education.   Pain Assessment  Pain Assessment: No/denies pain.        Objective:   Observation of Patient/Vital Signs:  Patient is seated in a bedside chair with telemetry and peripheral IV, O2 at 4 liters/minute via nasal cannula in place.         Cognition/Neuro Status  Arousal/Alertness: Appropriate responses to stimuli  Attention Span: Appears intact  Orientation Level: Oriented X4  Memory: Appears intact  Following Commands: independent  Safety Awareness: minimal verbal instruction  Insights: Fully aware of deficits  Behavior: attentive;calm;cooperative  Hand Dominance: right handed    Gross ROM  Right Upper Extremity ROM: within functional limits  Left Upper Extremity ROM: within functional limits  Gross Strength  Right Upper Extremity Strength: 4/5  Left Upper Extremity Strength: 4/5          Sensory  Auditory: intact  Tactile - Light Touch: impaired left;impaired right (due to h/o CTS)  Visual Acuity: wears glasses       Self-care and Home  Management  Grooming: Contact Guard Assist;standing at sink;wash/dry hands;verbal prompting;steadying  Toileting: Contact Guard Assist;clothing management down;clothing management up;perineal hygiene;steadying;verbal prompting;grab bar use  Functional Transfers: Contact Guard Assist;toilet transfer;steadying;verbal prompting    Mobility and Transfers  Sit to Stand: Contact Guard Assist;with instruction for hand placement to increase safety (from arm-chair)  Chair<-->Toilet: Contact Guard Assist (w/o A.D.)  -verbal/tactile instructions for proper hand placement and pacing with all functional transfers for increased safety.        Balance  Static Sitting Balance: good  Static Standing Balance: good (w/o A.D.)  Dynamic Standing Balance: fair (w/o A.D.)    Participation and Endurance  Participation Effort: fair  Endurance: Tolerates 10 - 20 min exercise with multiple rests  [place] Dyspnea Scale: 1+ Dyspnea    AM-PACT "6 Clicks" Daily Activity Inpatient Short Form  Inpatient AM-PACT Performed?: yes  Put On/Take Off Lower Body Clothing: A little  Assist with Bathing: A little  Assist with Toileting: A little  Put On/Take Off Upper Body Clothing: A little  Assist with Grooming: A little  Assist with Eating: None  OT Daily Activity Raw Score: 19  CMS 0-100% Score: 42.80%    Treatment Activities: Patient instructed in proper pacing with transitional mvmts, encouraging Patient to wait atleast 60 secs. To notice how she feels (lightheaded or dizzy) before attempting to stand/walk for fall prevention. Patient verbalized understanding of same. Patient educated in LB dressing techniques with recommendations made to sit to initiate pants/underwear over feet, then to stand to pull pants up over hips for fall prevention/energy conservation.  Patient educated in importance of OOB sitting for all meals to increase endurance/strength for activities and for pulmonary hygiene. Patient provided with DME catalog for purchasing  information on toilet aides, sock-aides for compression stocking and bidets due to patient reporting difficulty performing toileting tasks and donning compression stocking with current sock-aide. Patient receptive to same. Patient seated in arm-chair at end of session with all needs within reach. Patient instructed to ring for nursing for all needs and appeared receptive to all education provided.       Educated the patient to role of occupational therapy, plan of care, goals of therapy and safety with mobility and ADLs, home safety.      Noreene Larsson  [place], MS,OTR/L  Pager # [phone]  [phone]

## 2016-03-27 NOTE — Plan of Care (Signed)
AAOX4. Ambulate in the room with stand by assist. Prn pain medication given for chronic back pain. Neb treatment schedule. Pt verbalized " I am feeling much better than before". Tolerated consistent carb diet. No N/V. Accu check per protocol. Tele NSR. O2 stat 94% in 4L NC. SCD on. Hourly rounding. Will continue monitor the pt.

## 2016-03-27 NOTE — Progress Notes (Signed)
PULSE OXIMETRY TESTING: (For Home Oxygen)    Document patient's oxygen at rest on room air:     _____% on room air, at rest (NO Ranges please)    _____% on Oxygen, at rest at ____LPM via Nasal Canula      IF patient's saturation is 88% OR BELOW on room air, STOP Here,  IF NOT ambulate patient on room air with exertion and document below:    _____% on Room Air, with exertion (must be 88% or below)    _____% on Oxygen, with exertion, at ____LPM via NC    All three(3) tests must be during the same session.    RN:  Please copy and paste above template and document O2 saturations in a PROGRESS NOTE.    Testing to qualify for home Oxygen must be no earlier than 48 hours prior to discharge, or it will need to be repeated.    Please call the home health care liaison at [phone] when completed.  Thanks.

## 2016-03-27 NOTE — Progress Notes (Signed)
[facility] Hospitalist Daily Progress Note        Date Time: 03/27/2016  12:02 PM  Patient Name:Rachael [name]  XBJ:[id number]  PCP: Jocelyn Lamer, DO  Attending Physician:Glenford Garis Glenis Smoker M.D.      Chief Complaint:      Chief Complaint   Patient presents with   . Altered Mental Status   . Shortness of Breath       Subjective:     Rachael [name] significantly improved shortness of breath, improving and cough is now more productive complaint   No headache, No chest pain, No abdominal pain - No Nausea, No new weakness tingling or numbness.  Assessment/Plan   Acute hypoxic respiratory failure secondary to chronic obstructive pulmonary disease exacerbation and the patient with underlying bronchomalacia:community acquired pneumonia:  Slowly improving  Continue intravenous Rocephin and azithromycin  Dose of intravenous steroids reduced, around-the-clock nebulizers  Follow cultures   Pulmonary following  Lactic acidosis : Resolved with IV fluids  Altered mental status secondary to above: Resolved  Intermittent chest pain: Most likely secondary to above, resolved.  Patient has been ruled out.  Seen by cardiology.  Will need outpatient follow-up   Polymyalgia rheumatica: Patient on low-dose of prednisone at home, which is currently being withheld as the patient is currently on IV steroids  Diabetes mellitus 2: Hemoglobin A1c 7.5  Blood sugars uncontrolled secondary to steroids  Increased her dose of Lantus and continue high-dose sliding scale and monitor trends  Hypertension  Blood pressure stable.  Continue Cardizem and Toprol-XL  Hypothyroidism  Thyroid-stimulating hormone is normal.  Continue Synthroid  Gastroesophageal reflux disease on Pepcid  Obstructive sleep apnea.  CPAP at night    DVT Prohylaxis: Lovenox  Code Status: Full Code   Prognosis: Guarded  Type of Admission:Inpatient  Estimated Length of Stay (including stay in the ER receiving treatment): Greater  than 2 midnights  Medical Necessity for stay: Acute hypoxic respiratory failure, chronic obstructive pulmonary disease exacerbation, clinically quite pneumonia.  Lactic acidosis    Allergies:     Allergies   Allergen Reactions   . Tolectin [Tolmetin] Swelling   . Latex Itching       Physical Exam:   Vitals reviewed:   height is 1.524 m (5') and weight is 82.872 kg (182 lb 11.2 oz). Her temporal artery temperature is 96.9 F (36.1 C). Her blood pressure is 123/66 and her pulse is 104. Her respiration is 20 and oxygen saturation is 92%.   Body mass index is 35.68 kg/(m^2).  Filed Vitals:    03/27/16 0151 03/27/16 0531 03/27/16 0712 03/27/16 0918   BP: 135/71 122/71  123/66   Pulse: 98 98 86 104   Temp: 97.9 F (36.6 C) 98.1 F (36.7 C)  96.9 F (36.1 C)   TempSrc: Temporal Artery Temporal Artery  Temporal Artery   Resp: 20 20 18 20    Height:       Weight:  82.872 kg (182 lb 11.2 oz)     SpO2: 90% 92% 92% 92%     Intake and Output Summary (Last 24 hours) at Date Time    Intake/Output Summary (Last 24 hours) at 03/27/16 1202  Last data filed at 03/27/16 0911   Gross per 24 hour   Intake    785 ml   Output      0 ml   Net    785 ml       Exam  Awake Alert, Oriented *3, No new F.N deficits, in no  visible respiratory distress  NC.AT,PERRAL  Supple Neck,No JVD, No cervical lymphadenopathy appriciated.   Symmetrical Chest wall movement, improved air movement bilaterally, coarse, diffuse bilateral expiratory rhonchi   RRR,No Gallops,Rubs or new Murmurs, No Parasternal Heave  +ve B.Sounds, Abd Soft, Non tender, No organomegaly appriciated, No rebound -guarding or rigidity.  No Cyanosis, Clubbing or edema, No new Rash or bruise    Consult Input/Plan     Pulmonology  Cardiologist    Medications:     Current Facility-Administered Medications   Medication Dose Route Frequency Last Rate Last Dose   . acetaminophen (TYLENOL) tablet 650 mg  650 mg Oral Q4H PRN   650 mg at 03/25/16 2255    Or   . acetaminophen (TYLENOL)  suppository 650 mg  650 mg Rectal Q4H PRN       . acetylcysteine (MUCOMYST) 10 % nebulizer solution 3 mL  3 mL Nebulization Q8H SCH       . albuterol-ipratropium (DUO-NEB) 2.5-0.5(3) mg/3 mL nebulizer 3 mL  3 mL Nebulization Q4H WA   3 mL at 03/27/16 1110   . ALPRAZolam Prudy Feeler) tablet 0.25 mg  0.25 mg Oral TID PRN   0.25 mg at 03/26/16 2133   . azithromycin (ZITHROMAX) 500 mg in sodium chloride 0.9 % 250 mL IVPB  500 mg Intravenous Q24H SCH 250 mL/hr at 03/26/16 1514 500 mg at 03/26/16 1514   . cefTRIAXone (ROCEPHIN) 1 g in sodium chloride 0.9 % 100 mL IVPB mini-bag plus  1 g Intravenous Q24H SCH 200 mL/hr at 03/27/16 1016 1 g at 03/27/16 1016   . dextrose (GLUCOSE) 40 % oral gel 15 g of glucose  15 g of glucose Oral PRN        And   . dextrose (D10W) 10% bolus 125 mL  125 mL Intravenous PRN        And   . glucagon (rDNA) (GLUCAGEN) injection 1 mg  1 mg Intramuscular PRN       . dilTIAZem (CARDIZEM CD) 24 hr capsule 240 mg  240 mg Oral Daily   240 mg at 03/27/16 1017   . docusate sodium (COLACE) capsule 100 mg  100 mg Oral Q12H   100 mg at 03/27/16 1017   . DULoxetine (CYMBALTA) DR capsule 60 mg  60 mg Oral BID   60 mg at 03/27/16 1017   . enoxaparin (LOVENOX) syringe 40 mg  40 mg Subcutaneous Daily   40 mg at 03/26/16 2133   . ezetimibe (ZETIA) tablet 10 mg  10 mg Oral Daily   10 mg at 03/27/16 1017   . famotidine (PEPCID) tablet 20 mg  20 mg Oral Daily   20 mg at 03/26/16 2133   . fluticasone furoate-vilanterol (BREO ELLIPTA) 100-25 MCG/INH 1 puff  1 puff Inhalation QAM   1 puff at 03/27/16 0712   . folic acid (FOLVITE) tablet 1 mg  1 mg Oral Daily   1 mg at 03/27/16 1017   . gabapentin (NEURONTIN) capsule 300 mg  300 mg Oral BID   300 mg at 03/27/16 1017   . guaiFENesin (MUCINEX) 12 hr tablet 600 mg  600 mg Oral Q12H SCH   600 mg at 03/27/16 1017   . losartan (COZAAR) tablet 100 mg  100 mg Oral QAM   100 mg at 03/27/16 0900    And   . hydroCHLOROthiazide (HYDRODIURIL) tablet 25 mg  25 mg Oral QAM   25 mg at  03/26/16 0806   . HYDROcodone-homatropine (  HYCODAN) 5-1.5 MG/5ML syrup 5 mL  5 mL Oral Q4H PRN   5 mL at 03/27/16 0900   . insulin glargine (LANTUS) injection 12 Units  12 Units Subcutaneous QHS       . insulin lispro (HumaLOG) injection 1-6 Units  1-6 Units Subcutaneous QHS PRN   2 Units at 03/26/16 2350   . insulin lispro (HumaLOG) injection 2-10 Units  2-10 Units Subcutaneous TID AC PRN   8 Units at 03/27/16 1148   . lactobacillus/streptococcus (RISAQUAD) capsule 1 capsule  1 capsule Oral Daily   1 capsule at 03/27/16 1016   . lactulose (CHRONULAC) 10 GM/15ML solution 10 g  10 g Oral QD PRN       . levothyroxine (SYNTHROID, LEVOTHROID) tablet 50 mcg  50 mcg Oral Daily at 0600   50 mcg at 03/27/16 0523   . methylPREDNISolone sodium succinate (Solu-MEDROL) injection 40 mg  40 mg Intravenous Q12H       . metoprolol (LOPRESSOR) injection 5 mg  5 mg Intravenous 4X Daily PRN       . metoprolol XL (TOPROL-XL) 24 hr tablet 50 mg  50 mg Oral Daily   50 mg at 03/27/16 1016   . naloxone (NARCAN) injection 0.2 mg  0.2 mg Intravenous PRN       . nitroglycerin (NITROSTAT) SL tablet 0.4 mg  0.4 mg Sublingual Q5 Min PRN       . ondansetron (ZOFRAN) injection 4 mg  4 mg Intravenous Q6H PRN       . pantoprazole (PROTONIX) EC tablet 40 mg  40 mg Oral Daily   40 mg at 03/27/16 0845   . polyethylene glycol (MIRALAX) packet 17 g  17 g Oral QD PRN       . traMADol (ULTRAM) tablet 50 mg  50 mg Oral BID PRN   50 mg at 03/26/16 1042   . traZODone (DESYREL) tablet 100 mg  100 mg Oral QHS   100 mg at 03/25/16 2256          Labs:reviewed     Results     Procedure Component Value Units Date/Time    Glucose Whole Blood - POCT [[id number]]  (Abnormal) Collected:  03/27/16 0731     POCT - Glucose Whole blood 278 (H) mg/dL Updated:  47/82/95 6213    CBC with differential [[id number]]  (Abnormal) Collected:  03/27/16 0608    Specimen Information:  Blood from Blood Updated:  03/27/16 0713     WBC 11.89 (H) x10 3/uL      Hgb 11.7 (L) g/dL       Hematocrit 62.9 %      Platelets 261 x10 3/uL      RBC 4.10 (L) x10 6/uL      MCV 90.2 fL      MCH 28.5 pg      MCHC 31.6 (L) g/dL      RDW 15 %      MPV 8.9 (L) fL     Manual Differential [[id number]]  (Abnormal) Collected:  03/27/16 0608     Segmented Neutrophils 76 % Updated:  03/27/16 0713     Band Neutrophils 16 %      Lymphocytes Manual 4 %      Monocytes Manual 4 %      Eosinophils Manual 0 %      Basophils Manual 0 %      Nucleated RBC 0 /100 WBC      Abs Seg Manual 9.04 (H)  x10 3/uL      Bands Absolute 1.90 (H) x10 3/uL      Absolute Lymph Manual 0.48 (L) x10 3/uL      Monocytes Absolute 0.48 x10 3/uL      Absolute Eos Manual 0.00 x10 3/uL      Absolute Baso Manual 0.00 x10 3/uL     Cell MorpHology [[id number]] Collected:  03/27/16 0608     Cell Morphology: Normal Updated:  03/27/16 0713     Platelet Estimate Normal     Basic Metabolic Panel [[id number]]  (Abnormal) Collected:  03/27/16 0608    Specimen Information:  Blood Updated:  03/27/16 0701     Glucose 289 (H) mg/dL      BUN 59.5 mg/dL      Creatinine 0.8 mg/dL      Calcium 9.0 mg/dL      Sodium 638 mEq/L      Potassium 4.0 mEq/L      Chloride 104 mEq/L      CO2 25 mEq/L      Anion Gap 10.0     GFR [[id number]] Collected:  03/27/16 0608     EGFR >60.0 Updated:  03/27/16 0701    Glucose Whole Blood - POCT [[id number]]  (Abnormal) Collected:  03/26/16 2018     POCT - Glucose Whole blood 250 (H) mg/dL Updated:  30/16/01 0932    CULTURE BLOOD AEROBIC AND ANAEROBIC #2 [[id number]] Collected:  03/25/16 1500    Specimen Information:  Arm from Blood Updated:  03/26/16 2021    Narrative:      ORDER#: [id number]                                    ORDERED BY: SHIFFERT, KENT  SOURCE: Blood right ac                               COLLECTED:  03/25/16 15:00  ANTIBIOTICS AT COLL.:                                RECEIVED :  03/25/16 19:40  Culture Blood Aerobic and Anaerobic        PRELIM      03/26/16 20:21  03/26/16   No Growth after 1 day/s of incubation.      CULTURE  BLOOD AEROBIC AND ANAEROBIC #1 [[id number]] Collected:  03/25/16 1252    Specimen Information:  Arm from Blood Updated:  03/26/16 1821    Narrative:      ORDER#: [id number]                                    ORDERED BY: SHIFFERT, KENT  SOURCE: Blood LEFT AC                                COLLECTED:  03/25/16 12:52  ANTIBIOTICS AT COLL.:                                RECEIVED :  03/25/16 17:53  Culture Blood Aerobic and Anaerobic        PRELIM  03/26/16 18:21  03/26/16   No Growth after 1 day/s of incubation.      Glucose Whole Blood - POCT [[id number]]  (Abnormal) Collected:  03/26/16 1558     POCT - Glucose Whole blood 361 (H) mg/dL Updated:  47/82/95 6213    Throat Culture [[id number]] Collected:  03/25/16 1252    Specimen Information:  Throat Updated:  03/26/16 1422    Narrative:      ORDER#: [id number]                                    ORDERED BY: SHIFFERT, KENT  SOURCE: Throat                                       COLLECTED:  03/25/16 12:52  ANTIBIOTICS AT COLL.:                                RECEIVED :  03/25/16 16:57  Throat Culture                             PRELIM      03/26/16 14:22  03/26/16   Culture in progress, final to follow      Procalcitonin [[id number]] Collected:  03/25/16 1252     Procalcitonin <0.1 Updated:  03/26/16 1417          Rads:reviewed   Radiological Procedure reviewed.  Radiology Results (24 Hour)     ** No results found for the last 24 hours. **          Multi-Disciplinary Care Input:   Case management notes: No Patient Care Coordination Note on file.     PT/OT/ST notes:          Signed by: Anselmo Pickler  03/27/2016 12:02 PM         This note was generated by the Epic EMR system/ Dragon speech recognition and may contain inherent errors or omissions not intended by the user. Grammatical errors, random word insertions, deletions, pronoun errors and incomplete sentences are occasional consequences of this technology due to software limitations. Not all errors are caught or  corrected. If there are questions or concerns about the content of this note or information contained within the body of this dictation they should be addressed directly with the author for clarification

## 2016-03-28 LAB — MAN DIFF ONLY
Band Neutrophils Absolute: 0.96 10*3/uL (ref 0.00–1.00)
Band Neutrophils: 6 %
Basophils Absolute Manual: 0 10*3/uL (ref 0.00–0.20)
Basophils Manual: 0 %
Eosinophils Absolute Manual: 0 10*3/uL (ref 0.00–0.70)
Eosinophils Manual: 0 %
Lymphocytes Absolute Manual: 0 10*3/uL — ABNORMAL LOW (ref 0.50–4.40)
Lymphocytes Manual: 0 %
Monocytes Absolute: 0.16 10*3/uL (ref 0.00–1.20)
Monocytes Manual: 1 %
Neutrophils Absolute Manual: 14.83 10*3/uL — ABNORMAL HIGH (ref 1.80–8.10)
Nucleated RBC: 0 /100 WBC (ref 0–1)
Segmented Neutrophils: 93 %

## 2016-03-28 LAB — BASIC METABOLIC PANEL
Anion Gap: 12 (ref 5.0–15.0)
BUN: 19.6 mg/dL — ABNORMAL HIGH (ref 7.0–19.0)
CO2: 23 mEq/L (ref 22–29)
Calcium: 8.9 mg/dL (ref 7.9–10.2)
Chloride: 101 mEq/L (ref 100–111)
Creatinine: 0.8 mg/dL (ref 0.6–1.0)
Glucose: 326 mg/dL — ABNORMAL HIGH (ref 70–100)
Potassium: 4.3 mEq/L (ref 3.5–5.1)
Sodium: 136 mEq/L (ref 136–145)

## 2016-03-28 LAB — CBC AND DIFFERENTIAL
Hematocrit: 40.1 % (ref 37.0–47.0)
Hgb: 12.8 g/dL (ref 12.0–16.0)
MCH: 29.2 pg (ref 28.0–32.0)
MCHC: 31.9 g/dL — ABNORMAL LOW (ref 32.0–36.0)
MCV: 91.6 fL (ref 80.0–100.0)
MPV: 9.3 fL — ABNORMAL LOW (ref 9.4–12.3)
Platelets: 319 10*3/uL (ref 140–400)
RBC: 4.38 10*6/uL (ref 4.20–5.40)
RDW: 15 % (ref 12–15)
WBC: 15.95 10*3/uL — ABNORMAL HIGH (ref 3.50–10.80)

## 2016-03-28 LAB — GLUCOSE WHOLE BLOOD - POCT
Whole Blood Glucose POCT: 187 mg/dL — ABNORMAL HIGH (ref 70–100)
Whole Blood Glucose POCT: 333 mg/dL — ABNORMAL HIGH (ref 70–100)
Whole Blood Glucose POCT: 242 mg/dL — ABNORMAL HIGH (ref 70–100)
Whole Blood Glucose POCT: 266 mg/dL — ABNORMAL HIGH (ref 70–100)

## 2016-03-28 LAB — CELL MORPHOLOGY
Cell Morphology: NORMAL
Platelet Estimate: NORMAL

## 2016-03-28 LAB — GFR: EGFR: >60

## 2016-03-28 MED ORDER — INSULIN GLARGINE 100 UNIT/ML SC SOLN
7.0000 [IU] | Freq: Once | SUBCUTANEOUS | Status: AC
Start: 2016-03-28 — End: 2016-03-28
  Administered 2016-03-28: 7 [IU] via SUBCUTANEOUS
  Filled 2016-03-28: qty 7

## 2016-03-28 NOTE — Progress Notes (Signed)
[facility] Hospitalist Daily Progress Note        Date Time: 03/28/2016  1:35 PM  Patient Name:Rachael [name]  ZOX:[id number]  PCP: Jocelyn Lamer, DO  Attending Physician:Iysha Mishkin Glenis Smoker M.D.      Chief Complaint:      Chief Complaint   Patient presents with   . Altered Mental Status   . Shortness of Breath       Subjective:     Rachael [name] continues feel better, shortness of breath improving   No headache, No chest pain, No abdominal pain - No Nausea, No new weakness tingling or numbness.  Assessment/Plan   Acute hypoxic respiratory failure secondary to chronic obstructive pulmonary disease exacerbation and the patient with underlying bronchomalacia:community acquired pneumonia:   improving  Continue intravenous Rocephin and azithromycin IV Solu-Medrol, around-the-clock nebulizers  Cultures negative to date  Pulmonary following  Lactic acidosis : Resolved with IV fluids  Altered mental status secondary to above: Resolved  Intermittent chest pain: Most likely secondary to above, resolved.  Patient has been ruled out.  Seen by cardiology.  Will need outpatient follow-up   Polymyalgia rheumatica: Patient on low-dose of prednisone at home, which is currently being withheld as the patient is currently on IV steroids  Diabetes mellitus 2: Hemoglobin A1c 7.5  Blood sugars continues to be uncontrolled secondary to steroids  Adjust the dose of Lantus and continue high-dose sliding scale and monitor trends  Hypertension  Blood pressure stable.  Continue Cardizem and Toprol-XL  Hypothyroidism  Thyroid-stimulating hormone is normal.  Continue Synthroid  Gastroesophageal reflux disease on Pepcid  Obstructive sleep apnea.  CPAP at night    DVT Prohylaxis: Lovenox  Code Status: Full Code   Prognosis: Guarded  Type of Admission:Inpatient  Estimated Length of Stay (including stay in the ER receiving treatment): Greater than 2 midnights  Medical Necessity for stay: Acute  hypoxic respiratory failure, chronic obstructive pulmonary disease exacerbation, clinically quite pneumonia.  Lactic acidosis    Allergies:     Allergies   Allergen Reactions   . Tolectin [Tolmetin] Swelling   . Latex Itching       Physical Exam:   Vitals reviewed:   height is 1.524 m (5') and weight is 83.371 kg (183 lb 12.8 oz). Her temporal artery temperature is 98 F (36.7 C). Her blood pressure is 145/78 and her pulse is 104. Her respiration is 19 and oxygen saturation is 6%.   Body mass index is 35.9 kg/(m^2).  Filed Vitals:    03/28/16 0500 03/28/16 0738 03/28/16 0842 03/28/16 0959   BP: 125/83  123/73 145/78   Pulse: 73 85 101 104   Temp: 98.4 F (36.9 C)   98 F (36.7 C)   TempSrc: Temporal Artery   Temporal Artery   Resp: 18 20  19    Height:       Weight: 83.371 kg (183 lb 12.8 oz)      SpO2: 95% 95%  6%     Intake and Output Summary (Last 24 hours) at Date Time    Intake/Output Summary (Last 24 hours) at 03/28/16 1335  Last data filed at 03/27/16 2200   Gross per 24 hour   Intake    450 ml   Output      0 ml   Net    450 ml       Exam  Awake Alert, Oriented *3, No new F.N deficits, in no visible respiratory distress  NC.AT,PERRAL  Supple Neck,No JVD, No  cervical lymphadenopathy appriciated.   Symmetrical Chest wall movement, improved air movement bilaterally, coarse, diffuse bilateral expiratory rhonchi   RRR,No Gallops,Rubs or new Murmurs, No Parasternal Heave  +ve B.Sounds, Abd Soft, Non tender, No organomegaly appriciated, No rebound -guarding or rigidity.  No Cyanosis, Clubbing or edema, No new Rash or bruise    Consult Input/Plan     Pulmonology  Cardiologist    Medications:     Current Facility-Administered Medications   Medication Dose Route Frequency Last Rate Last Dose   . acetaminophen (TYLENOL) tablet 650 mg  650 mg Oral Q4H PRN   650 mg at 03/25/16 2255    Or   . acetaminophen (TYLENOL) suppository 650 mg  650 mg Rectal Q4H PRN       . acetylcysteine (MUCOMYST) 10 % nebulizer solution 3 mL   3 mL Nebulization Q8H SCH   3 mL at 03/28/16 0739   . albuterol-ipratropium (DUO-NEB) 2.5-0.5(3) mg/3 mL nebulizer 3 mL  3 mL Nebulization Q4H WA   3 mL at 03/28/16 1127   . ALPRAZolam Prudy Feeler) tablet 0.25 mg  0.25 mg Oral TID PRN   0.25 mg at 03/27/16 2140   . azithromycin (ZITHROMAX) 500 mg in sodium chloride 0.9 % 250 mL IVPB  500 mg Intravenous Q24H SCH 250 mL/hr at 03/27/16 1559 500 mg at 03/27/16 1559   . benzocaine-menthol (CEPACOL) lozenge 1 lozenge  1 lozenge Buccal Q1H PRN   1 lozenge at 03/27/16 2140   . cefTRIAXone (ROCEPHIN) 1 g in sodium chloride 0.9 % 100 mL IVPB mini-bag plus  1 g Intravenous Q24H SCH 200 mL/hr at 03/28/16 1025 1 g at 03/28/16 1025   . dextrose (GLUCOSE) 40 % oral gel 15 g of glucose  15 g of glucose Oral PRN        And   . dextrose (D10W) 10% bolus 125 mL  125 mL Intravenous PRN        And   . glucagon (rDNA) (GLUCAGEN) injection 1 mg  1 mg Intramuscular PRN       . dilTIAZem (CARDIZEM CD) 24 hr capsule 240 mg  240 mg Oral Daily   240 mg at 03/28/16 1019   . docusate sodium (COLACE) capsule 100 mg  100 mg Oral Q12H   100 mg at 03/28/16 1019   . DULoxetine (CYMBALTA) DR capsule 60 mg  60 mg Oral BID   60 mg at 03/28/16 1019   . enoxaparin (LOVENOX) syringe 40 mg  40 mg Subcutaneous Daily   40 mg at 03/27/16 2136   . ezetimibe (ZETIA) tablet 10 mg  10 mg Oral Daily   10 mg at 03/28/16 1019   . famotidine (PEPCID) tablet 20 mg  20 mg Oral Daily   20 mg at 03/27/16 2137   . fluticasone furoate-vilanterol (BREO ELLIPTA) 100-25 MCG/INH 1 puff  1 puff Inhalation QAM   1 puff at 03/28/16 0742   . folic acid (FOLVITE) tablet 1 mg  1 mg Oral Daily   1 mg at 03/28/16 1019   . gabapentin (NEURONTIN) capsule 300 mg  300 mg Oral BID   300 mg at 03/28/16 1019   . guaiFENesin (MUCINEX) 12 hr tablet 600 mg  600 mg Oral Q12H SCH   600 mg at 03/28/16 1019   . losartan (COZAAR) tablet 100 mg  100 mg Oral QAM   100 mg at 03/28/16 0844    And   . hydroCHLOROthiazide (HYDRODIURIL) tablet 25 mg  25 mg  Oral  QAM   25 mg at 03/28/16 0844   . HYDROcodone-homatropine (HYCODAN) 5-1.5 MG/5ML syrup 5 mL  5 mL Oral Q4H PRN   5 mL at 03/27/16 2358   . insulin glargine (LANTUS) injection 12 Units  12 Units Subcutaneous QHS   12 Units at 03/27/16 2203   . insulin lispro (HumaLOG) injection 1-6 Units  1-6 Units Subcutaneous QHS PRN   2 Units at 03/27/16 2200   . insulin lispro (HumaLOG) injection 2-10 Units  2-10 Units Subcutaneous TID AC PRN   8 Units at 03/28/16 1206   . lactobacillus/streptococcus (RISAQUAD) capsule 1 capsule  1 capsule Oral Daily   1 capsule at 03/28/16 1019   . lactulose (CHRONULAC) 10 GM/15ML solution 10 g  10 g Oral QD PRN       . levothyroxine (SYNTHROID, LEVOTHROID) tablet 50 mcg  50 mcg Oral Daily at 0600   50 mcg at 03/28/16 0555   . methylPREDNISolone sodium succinate (Solu-MEDROL) injection 40 mg  40 mg Intravenous Q12H   40 mg at 03/28/16 0555   . metoprolol (LOPRESSOR) injection 5 mg  5 mg Intravenous 4X Daily PRN       . metoprolol XL (TOPROL-XL) 24 hr tablet 50 mg  50 mg Oral Daily   50 mg at 03/28/16 1019   . naloxone (NARCAN) injection 0.2 mg  0.2 mg Intravenous PRN       . nitroglycerin (NITROSTAT) SL tablet 0.4 mg  0.4 mg Sublingual Q5 Min PRN       . ondansetron (ZOFRAN) injection 4 mg  4 mg Intravenous Q6H PRN       . pantoprazole (PROTONIX) EC tablet 40 mg  40 mg Oral Daily   40 mg at 03/28/16 0844   . polyethylene glycol (MIRALAX) packet 17 g  17 g Oral QD PRN       . traMADol (ULTRAM) tablet 50 mg  50 mg Oral BID PRN   50 mg at 03/27/16 2140   . traZODone (DESYREL) tablet 100 mg  100 mg Oral QHS   100 mg at 03/25/16 2256          Labs:reviewed     Results     Procedure Component Value Units Date/Time    Glucose Whole Blood - POCT [[id number]]  (Abnormal) Collected:  03/28/16 1125     POCT - Glucose Whole blood 333 (H) mg/dL Updated:  40/98/11 9147    Manual Differential [[id number]]  (Abnormal) Collected:  03/28/16 0825     Segmented Neutrophils 93 % Updated:  03/28/16 1028     Band  Neutrophils 6 %      Lymphocytes Manual 0 %      Monocytes Manual 1 %      Eosinophils Manual 0 %      Basophils Manual 0 %      Nucleated RBC 0 /100 WBC      Abs Seg Manual 14.83 (H) x10 3/uL      Bands Absolute 0.96 x10 3/uL      Absolute Lymph Manual 0.00 (L) x10 3/uL      Monocytes Absolute 0.16 x10 3/uL      Absolute Eos Manual 0.00 x10 3/uL      Absolute Baso Manual 0.00 x10 3/uL     Narrative:      patient refused twice  03/28/2016  07:47 Rickey Barbara    Cell MorpHology [[id number]] Collected:  03/28/16 0825     Cell Morphology: Normal Updated:  03/28/16  1028     Platelet Estimate Normal     Narrative:      patient refused twice  03/28/2016  07:47 /csv    CBC with differential [[id number]]  (Abnormal) Collected:  03/28/16 0825    Specimen Information:  Blood from Blood Updated:  03/28/16 1028     WBC 15.95 (H) x10 3/uL      Hgb 12.8 g/dL      Hematocrit 30.8 %      Platelets 319 x10 3/uL      RBC 4.38 x10 6/uL      MCV 91.6 fL      MCH 29.2 pg      MCHC 31.9 (L) g/dL      RDW 15 %      MPV 9.3 (L) fL     Narrative:      patient refused twice  03/28/2016  07:47 /csv    GFR [[id number]] Collected:  03/28/16 0825     EGFR >60.0 Updated:  03/28/16 1002    Narrative:      patient refused twice  03/28/2016  07:47 Rickey Barbara    Basic Metabolic Panel [[id number]]  (Abnormal) Collected:  03/28/16 0825    Specimen Information:  Blood Updated:  03/28/16 1002     Glucose 326 (H) mg/dL      BUN 40.1 (H) mg/dL      Creatinine 0.8 mg/dL      Calcium 8.9 mg/dL      Sodium 027 mEq/L      Potassium 4.3 mEq/L      Chloride 101 mEq/L      CO2 23 mEq/L      Anion Gap 12.0     Narrative:      patient refused twice  03/28/2016  07:47 /csv    Glucose Whole Blood - POCT [[id number]]  (Abnormal) Collected:  03/28/16 0731     POCT - Glucose Whole blood 187 (H) mg/dL Updated:  47/42/59 5638    Glucose Whole Blood - POCT [[id number]]  (Abnormal) Collected:  03/27/16 2048     POCT - Glucose Whole blood 219 (H) mg/dL Updated:  18/84/16 6063    CULTURE BLOOD  AEROBIC AND ANAEROBIC #2 [[id number]] Collected:  03/25/16 1500    Specimen Information:  Arm from Blood Updated:  03/27/16 2021    Narrative:      ORDER#: [id number]                                    ORDERED BY: SHIFFERT, KENT  SOURCE: Blood right ac                               COLLECTED:  03/25/16 15:00  ANTIBIOTICS AT COLL.:                                RECEIVED :  03/25/16 19:40  Culture Blood Aerobic and Anaerobic        PRELIM      03/27/16 20:21  03/26/16   No Growth after 1 day/s of incubation.  03/27/16   No Growth after 2 day/s of incubation.      CULTURE BLOOD AEROBIC AND ANAEROBIC #1 [[id number]] Collected:  03/25/16 1252    Specimen Information:  Arm from Blood Updated:  03/27/16 1821  Narrative:      ORDER#: [id number]                                    ORDERED BY: SHIFFERT, KENT  SOURCE: Blood LEFT AC                                COLLECTED:  03/25/16 12:52  ANTIBIOTICS AT COLL.:                                RECEIVED :  03/25/16 17:53  Culture Blood Aerobic and Anaerobic        PRELIM      03/27/16 18:21  03/26/16   No Growth after 1 day/s of incubation.  03/27/16   No Growth after 2 day/s of incubation.      Glucose Whole Blood - POCT [[id number]]  (Abnormal) Collected:  03/27/16 1623     POCT - Glucose Whole blood 276 (H) mg/dL Updated:  78/29/56 2130    Throat Culture [[id number]] Collected:  03/25/16 1252    Specimen Information:  Throat Updated:  03/27/16 1445    Narrative:      ORDER#: [id number]                                    ORDERED BY: SHIFFERT, KENT  SOURCE: Throat                                       COLLECTED:  03/25/16 12:52  ANTIBIOTICS AT COLL.:                                RECEIVED :  03/25/16 16:57  Throat Culture                             FINAL       03/27/16 14:44  03/27/16   No Beta Hemolytic Streptococcus Group A, C, or G isolated,             no further work.            Rads:reviewed   Radiological Procedure reviewed.  Radiology Results (24 Hour)     ** No results  found for the last 24 hours. **          Multi-Disciplinary Care Input:   Case management notes: No Patient Care Coordination Note on file.     PT/OT/ST notes:          Signed by: Anselmo Pickler  03/28/2016 1:35 PM         This note was generated by the Epic EMR system/ Dragon speech recognition and may contain inherent errors or omissions not intended by the user. Grammatical errors, random word insertions, deletions, pronoun errors and incomplete sentences are occasional consequences of this technology due to software limitations. Not all errors are caught or corrected. If there are questions or concerns about the content of this note or information contained within the body of this dictation they should be addressed directly with  the author for clarification

## 2016-03-28 NOTE — PT Progress Note (Signed)
Physical Therapy Note    [facility] [place] [facility]  [address]  [place], [place]. [address]    Department of Rehabilitation  [phone]    Physical Therapy Daily Treatment Note    Patient: Eileen Kangas    MRN#: [id number]     M201/M201-A    Time of Treatment: Start Time: 1149 Stop Time: 1300 Time Calculation (min): 71 min    PT Visit Number: 2    Patient's medical condition is appropriate for Physical Therapy intervention at this time.    Precautions and Contraindications:   Precautions  Other Precautions: falls    Assessment:Pt making great progress with therapy. Pt able to ambulate in room w/o AD. Left RW for ambulation on nursing unit to increase general strength and endurance.  Pt cont to present with generalized weakness decreased functional mobility and decreased endurance/ activity engagement due to COPD exacerbation, ARF.Pt will benefit from Home Health PT to ensure safe transition home and to address the following deficits: Decreased endurance/activity tolerance;Decreased functional mobility.   Prognosis: Good;With continued PT status post acute discharge   Progress: Progressing toward goals     Patient Goal: to go home    Plan:   Continue with Physical Therapy services to address strength,endurance,mobility deficts . Focus next session on gait training w/ LRAD,PLB during activty  Treatment/Interventions: Exercise;Gait training;Stair training;LE strengthening/ROM;Endurance training;Equipment eval/education   PT Frequency: 3-4x/wk     Based on today's session patient's discharge recommendation is the following: Discharge Recommendation: Home with home health PT If Discharge Recommendation: Home with home health PT is not available, then the patient will need increase supervision  and assistance with mobility.       Subjective: Patient is agreeable to participation in the therapy session. Family and/or guardian are agreeable to patient's participation in the therapy session. Nursing clears  patient for therapy.   Pain Assessment  Pain Assessment: No/denies pain     Objective:  Observation of Patient/Vital Signs:  Patient is seated in a bedside chair with telemetry and peripheral IV, O2 at 4 liters/minute via nasal cannula in place.Patient was instructed in the following functional, neuromuscular and therapeutic activities      Cognition/Neuro Status  Arousal/Alertness: Appropriate responses to stimuli  Attention Span: Appears intact  Orientation Level: Oriented X4  Memory: Appears intact  Following Commands: Follows all commands and directions without difficulty  Safety Awareness: independent  Insights: Fully aware of deficits;Educated in safety awareness  Problem Solving: Able to problem solve independently  Behavior: attentive;calm;cooperative  Motor Planning: intact  Coordination: intact  Hand Dominance: right handed    Functional Mobility  Sit to Stand: Supervision  Stand to Sit: Supervision;Independent (verbal for safe hand placement w/ RW,safely ambulate w/ therapist w/o HH assit.   Transfers  Posterior Scoot Transfers: Independent  Device Used for Functional Transfer:  (IV pole)  Locomotion  Ambulation: Supervision (pushing IV pole,)  Ambulation Distance (Feet): 40 Feet (limited by O2 lines)  Pattern: decreased cadence;within functional limits (very mild SOB)  Verbal instruction for step through sequencing to include correct RW placement with advancement of bilateral LE and proper use of both arms to help compensate for LE weakness and unsteady gait.due to DOE.  Stair Management: Supervision;two rails;step to pattern;forward;sideways  Number of Stairs: 4 (up/down x 1)  Instructed patient in safe technique with stair training utilizing a step to pattern with stepping up with stronger lower extremity and bringing the weaker lower extremity to the same step. Instructed patient in using two rails to  facilitate proper technique. Instructed patient on descending stairs by stepping down with weaker  lower extremity first and then bringing the stronger lower extremity down to the same step(using 1 HR w/ side stepping) Instructed patient in limiting stairs to once a day when first discharged home to reduce fatigue levels. Advised patient to come down in the morning and remain on one level during the day and then to go up the stairs in the evening ; to always have someone in the home when completing the stairs to reduce fall risk. Instructed patient to have family/friend stand in front of them as they come down the stairs and then behind them as they ascend the stairs. Family instructed as above,verbalized understanding.    Neuro Re-Ed  Sitting Balance: sitting reaching activities;without instruction;without support;independent  Standing Balance: dynamic gait training;without instruction;with support;supervision;patient education;variable environment/backgrounds Facilitated postural alignment with initial standing to ensure upright posture with shoulder and hip alignment. Educated patient on the importance of coming to a complete stand and establishing upright posture prior to attempting ambulation or transfers. Facilitated lateral weight shifting through hip and pelvis to facilitate natural postural adjustments during gait.w/ RW and IV pole.      Treatment Activities: mobility w/ and w/o AD on nursing unit. Stair training w/ 2HR and side stepping w/ descending stair. Pt/family educated on PLB . Educated patient in pursed lip breathing technique with focusing on taking deep breath in through their nose and exhaling through their mouth. Instructed patient on full inhale for a count of 3-5 and a full exhale for 5-8 seconds . Instructed patient on the importance of pushing carbon dioxide from lung bases which will allow for increase oxygen intake. Advised patient to continue with pursed lip breathing until SOB or decreased oxygen saturations resolves. Instructed in proper use of spirometer(10x per hour or 2-3x every  6-10 minutes) to help maximize pulmonary hygiene and advised patient sit at EOB or OOB frequently through the day to ensure the usage of the lower lobes of the lungs.    Educated the patient to role of physical therapy, plan of care, goals of therapy and HEP, safety with mobility and ADLs, energy conservation techniques, pursed lip breathing, home safety.    Patient left without needs and call bell within reach. chair Alarm set.  RN notified of session outcome.    Trude Mcburney  PM&R  [phone]

## 2016-03-28 NOTE — Plan of Care (Signed)
Problem: Safety  Goal: Patient will be free from injury during hospitalization  Outcome: Progressing  Call bell within reach. Bed in low locked position with bed alarm set. RN instructed pt to call for assistance.   Intervention: Assess patient's risk for falls and implement fall prevention plan of care per policy  .  Intervention: Provide and maintain safe environment  .  Intervention: Use appropriate transfer methods  .  Intervention: Ensure appropriate safety devices are available at the bedside  .  Intervention: Include patient/family/caregiver in decisions related to safety  .  Intervention: Hourly rounding.  .  Intervention: Assess for patient's risk for elopement and implement Elopement Risk plan per policy  .      Problem: Psychosocial and Spiritual Needs  Goal: Demonstrates ability to cope with hospitalization/illness  Outcome: Progressing  Intervention: Encourage verbalization of feelings/concerns/expectations  .  Intervention: Provide quiet environment  .  Intervention: Encourage patient to set small goals for self  .  Intervention: Include patient/patient care companion in decisions  .      Problem: Potential for Compromised Skin Integrity  Goal: Skin integrity is maintained or improved  Assess and monitor skin integrity. Identify patients at risk for skin breakdown on admission and per policy. Collaborate with interdisciplinary team and initiate plans and interventions as needed.   Outcome: Progressing  Intervention: Monitor/assess Braden Scale every shift  .  Intervention: Turn patient  .  Intervention: Relieve pressure to bony prominences  .  Intervention: Avoid shearing  .  Intervention: Keep skin clean and dry  .    Goal: Nutritional status is improving  Monitor and assess patient for malnutrition (ex- brittle hair, bruises, dry skin, pale skin and conjunctiva, muscle wasting, smooth red tongue, and disorientation). Collaborate with interdisciplinary team and initiate plan and interventions as  ordered. Monitor patient's weight and dietary intake as ordered or per policy. Utilize nutrition screening tool and intervene per policy. Determine patient's food preferences and provide high-protein, high-caloric foods as appropriate.   Intervention: Include patient/patient care companion in decisions  .

## 2016-03-28 NOTE — Progress Notes (Signed)
Pulmonary Progress Note  "[facility]"    Date Time: 03/28/2016 8:42 PM  Patient Name: Rachael [name]  Attending Physician: Anselmo Pickler, MD      Assessment:     Patient Active Problem List   Diagnosis   . Venous insufficiency of right leg   . Lymphedema of right lower extremity   . Chronic cholecystitis with calculus   . COPD exacerbation   . Acute respiratory failure with hypoxia   . Lactic acidosis           Plan:   Will add PEP therapy.  Continue nebs with Mucomyst.      Interval History   HPI: Still cough and wheezing. Mucomyst was added to nebs yesterday.        Interval History/24 hr. Events: none    Physical Exam:   Vital signs for last 24 hours:  Temp:  [97.5 F (36.4 C)-98.9 F (37.2 C)] 97.7 F (36.5 C)  Heart Rate:  [73-104] 90  Resp Rate:  [18-20] 19  BP: (123-159)/(72-83) 130/77 mmHg    Intake and Output Summary (Last 24 hours) at Date Time    Intake/Output Summary (Last 24 hours) at 03/28/16 2042  Last data filed at 03/28/16 1802   Gross per 24 hour   Intake   1050 ml   Output      0 ml   Net   1050 ml       General: awake, alert, oriented x 3; no acute distress; able to speak in complete sentences without accessory muscle use; in   HEENT: pupils equal with EOMI; no thyromegaly, no cervical LN, no thrush.  Cardiovascular: regular rate and rhythm, no murmurs, rubs or gallops. No P2/S2 present. No R sided heave. No JVD, no HJR.  Lungs: bilateral rochi and wheeze.  Abdomen: soft, non-tender, non-distended; no palpable masses, no hepatosplenomegaly, normoactive bowel sounds, no rebound or guarding  Extremities: no clubbing, cyanosis, or edema.  .      Meds:   Scheduled Meds:  Current Facility-Administered Medications   Medication Dose Route Frequency   . acetylcysteine  3 mL Nebulization Q8H SCH   . albuterol-ipratropium  3 mL Nebulization Q4H WA   . azithromycin  500 mg Intravenous Q24H SCH   . cefTRIAXone  1 g Intravenous Q24H SCH   . dilTIAZem  240 mg Oral  Daily   . docusate sodium  100 mg Oral Q12H   . DULoxetine  60 mg Oral BID   . enoxaparin  40 mg Subcutaneous Daily   . ezetimibe  10 mg Oral Daily   . famotidine  20 mg Oral Daily   . fluticasone furoate-vilanterol  1 puff Inhalation QAM   . folic acid  1 mg Oral Daily   . gabapentin  300 mg Oral BID   . guaiFENesin  600 mg Oral Q12H SCH   . losartan  100 mg Oral QAM    And   . hydroCHLOROthiazide  25 mg Oral QAM   . insulin glargine  12 Units Subcutaneous QHS   . lactobacillus/streptococcus  1 capsule Oral Daily   . levothyroxine  50 mcg Oral Daily at 0600   . methylPREDNISolone  40 mg Intravenous Q12H   . metoprolol XL  50 mg Oral Daily   . pantoprazole  40 mg Oral Daily   . traZODone  100 mg Oral QHS     Continuous Infusions:   PRN Meds:.acetaminophen **OR** acetaminophen, ALPRAZolam, benzocaine-menthol, Nursing communication:  Adult Hypoglycemia Treatment Algorithm **AND** dextrose **AND** dextrose **AND** glucagon (rDNA), HYDROcodone-homatropine, insulin lispro, insulin lispro, lactulose, metoprolol, naloxone, nitroglycerin, ondansetron, polyethylene glycol, traMADol      Labs:     Results     Procedure Component Value Units Date/Time    CULTURE BLOOD AEROBIC AND ANAEROBIC #2 [[id number]] Collected:  03/25/16 1500    Specimen Information:  Arm from Blood Updated:  03/28/16 2021    Narrative:      ORDER#: [id number]                                    ORDERED BY: SHIFFERT, KENT  SOURCE: Blood right ac                               COLLECTED:  03/25/16 15:00  ANTIBIOTICS AT COLL.:                                RECEIVED :  03/25/16 19:40  Culture Blood Aerobic and Anaerobic        PRELIM      03/28/16 20:21  03/26/16   No Growth after 1 day/s of incubation.  03/27/16   No Growth after 2 day/s of incubation.  03/28/16   No Growth after 3 day/s of incubation.      Respiratory Pathogen Panel, PCR (Film Array) [[id number]] Collected:  03/28/16 1828     Updated:  03/28/16 1829    Narrative:      Viral Transport Media  (VTM)    CULTURE BLOOD AEROBIC AND ANAEROBIC #1 [[id number]] Collected:  03/25/16 1252    Specimen Information:  Arm from Blood Updated:  03/28/16 1821    Narrative:      ORDER#: [id number]                                    ORDERED BY: SHIFFERT, KENT  SOURCE: Blood LEFT AC                                COLLECTED:  03/25/16 12:52  ANTIBIOTICS AT COLL.:                                RECEIVED :  03/25/16 17:53  Culture Blood Aerobic and Anaerobic        PRELIM      03/28/16 18:21  03/26/16   No Growth after 1 day/s of incubation.  03/27/16   No Growth after 2 day/s of incubation.  03/28/16   No Growth after 3 day/s of incubation.      Glucose Whole Blood - POCT [[id number]]  (Abnormal) Collected:  03/28/16 1646     POCT - Glucose Whole blood 242 (H) mg/dL Updated:  53/66/44 0347    Glucose Whole Blood - POCT [[id number]]  (Abnormal) Collected:  03/28/16 1125     POCT - Glucose Whole blood 333 (H) mg/dL Updated:  56/43/32 9518    Manual Differential [[id number]]  (Abnormal) Collected:  03/28/16 0825     Segmented Neutrophils 93 % Updated:  03/28/16 1028     Band Neutrophils 6 %  Lymphocytes Manual 0 %      Monocytes Manual 1 %      Eosinophils Manual 0 %      Basophils Manual 0 %      Nucleated RBC 0 /100 WBC      Abs Seg Manual 14.83 (H) x10 3/uL      Bands Absolute 0.96 x10 3/uL      Absolute Lymph Manual 0.00 (L) x10 3/uL      Monocytes Absolute 0.16 x10 3/uL      Absolute Eos Manual 0.00 x10 3/uL      Absolute Baso Manual 0.00 x10 3/uL     Narrative:      patient refused twice  03/28/2016  07:47 /csv    Cell MorpHology [[id number]] Collected:  03/28/16 0825     Cell Morphology: Normal Updated:  03/28/16 1028     Platelet Estimate Normal     Narrative:      patient refused twice  03/28/2016  07:47 /csv    CBC with differential [[id number]]  (Abnormal) Collected:  03/28/16 0825    Specimen Information:  Blood from Blood Updated:  03/28/16 1028     WBC 15.95 (H) x10 3/uL      Hgb 12.8 g/dL      Hematocrit 62.9 %       Platelets 319 x10 3/uL      RBC 4.38 x10 6/uL      MCV 91.6 fL      MCH 29.2 pg      MCHC 31.9 (L) g/dL      RDW 15 %      MPV 9.3 (L) fL     Narrative:      patient refused twice  03/28/2016  07:47 /csv    GFR [[id number]] Collected:  03/28/16 0825     EGFR >60.0 Updated:  03/28/16 1002    Narrative:      patient refused twice  03/28/2016  07:47 Rickey Barbara    Basic Metabolic Panel [[id number]]  (Abnormal) Collected:  03/28/16 0825    Specimen Information:  Blood Updated:  03/28/16 1002     Glucose 326 (H) mg/dL      BUN 64.4 (H) mg/dL      Creatinine 0.8 mg/dL      Calcium 8.9 mg/dL      Sodium 034 mEq/L      Potassium 4.3 mEq/L      Chloride 101 mEq/L      CO2 23 mEq/L      Anion Gap 12.0     Narrative:      patient refused twice  03/28/2016  07:47 /csv    Glucose Whole Blood - POCT [[id number]]  (Abnormal) Collected:  03/28/16 0731     POCT - Glucose Whole blood 187 (H) mg/dL Updated:  75/64/33 2951    Glucose Whole Blood - POCT [[id number]]  (Abnormal) Collected:  03/27/16 2048     POCT - Glucose Whole blood 219 (H) mg/dL Updated:  01/60/10 9323            Radiology:     Radiology Results (24 Hour)     ** No results found for the last 24 hours. **            Signed by: Kendra Opitz, MD  Date/Time: 03/28/2016 8:42 PM

## 2016-03-29 ENCOUNTER — Encounter: Payer: Self-pay | Admitting: Internal Medicine

## 2016-03-29 DIAGNOSIS — J206 Acute bronchitis due to rhinovirus: Secondary | ICD-10-CM | POA: Diagnosis present

## 2016-03-29 LAB — RESPIRATORY PATHOGEN PANEL, PCR (FILMARRAY) (SOFT)
Adenovirus: NOT DETECTED
Bordetella pertussis: NOT DETECTED
Chlamydophila pneumoniae: NOT DETECTED
Coronavirus 229E: NOT DETECTED
Coronavirus HKU1: NOT DETECTED
Coronavirus NL63: NOT DETECTED
Coronavirus OC43: NOT DETECTED
Human Metapneumovirus: NOT DETECTED
Human Rhinovirus/Enterovirus: DETECTED — AB
Influenza A/H1: NOT DETECTED
Influenza A/H3: NOT DETECTED
Influenza A: NOT DETECTED
Influenza AH1 - 2009: NOT DETECTED
Influenza B: NOT DETECTED
Mycoplasma pneumoniae: NOT DETECTED
Parainfluenza Virus 1: NOT DETECTED
Parainfluenza Virus 2: NOT DETECTED
Parainfluenza Virus 3: NOT DETECTED
Parainfluenza Virus 4: NOT DETECTED
Respiratory Syncytial Virus: NOT DETECTED

## 2016-03-29 LAB — BASIC METABOLIC PANEL
Anion Gap: 10 (ref 5.0–15.0)
BUN: 14.5 mg/dL (ref 7.0–19.0)
CO2: 27 mEq/L (ref 22–29)
Calcium: 8.8 mg/dL (ref 7.9–10.2)
Chloride: 102 mEq/L (ref 100–111)
Creatinine: 0.7 mg/dL (ref 0.6–1.0)
Glucose: 163 mg/dL — ABNORMAL HIGH (ref 70–100)
Potassium: 3.9 mEq/L (ref 3.5–5.1)
Sodium: 139 mEq/L (ref 136–145)

## 2016-03-29 LAB — GLUCOSE WHOLE BLOOD - POCT
Whole Blood Glucose POCT: 133 mg/dL — ABNORMAL HIGH (ref 70–100)
Whole Blood Glucose POCT: 287 mg/dL — ABNORMAL HIGH (ref 70–100)
Whole Blood Glucose POCT: 282 mg/dL — ABNORMAL HIGH (ref 70–100)
Whole Blood Glucose POCT: 281 mg/dL — ABNORMAL HIGH (ref 70–100)

## 2016-03-29 LAB — GFR: EGFR: >60

## 2016-03-29 MED ORDER — AZITHROMYCIN 250 MG PO TABS
500.0000 mg | ORAL_TABLET | Freq: Every day | ORAL | Status: DC
Start: 2016-03-30 — End: 2016-04-01
  Administered 2016-03-30 – 2016-04-01 (×3): 500 mg via ORAL
  Filled 2016-03-29 (×3): qty 2

## 2016-03-29 MED ORDER — METHYLPREDNISOLONE SODIUM SUCC 40 MG IJ SOLR
20.0000 mg | Freq: Two times a day (BID) | INTRAMUSCULAR | Status: DC
Start: 2016-03-29 — End: 2016-04-01
  Administered 2016-03-29 – 2016-04-01 (×6): 20 mg via INTRAVENOUS
  Filled 2016-03-29 (×6): qty 1

## 2016-03-29 MED ORDER — POTASSIUM CHLORIDE CRYS ER 20 MEQ PO TBCR
40.0000 meq | EXTENDED_RELEASE_TABLET | Freq: Once | ORAL | Status: AC
Start: 2016-03-29 — End: 2016-03-29
  Administered 2016-03-29: 40 meq via ORAL
  Filled 2016-03-29: qty 2

## 2016-03-29 MED ORDER — INSULIN GLARGINE 100 UNIT/ML SC SOLN
8.0000 [IU] | Freq: Every evening | SUBCUTANEOUS | Status: DC
Start: 2016-03-29 — End: 2016-04-01
  Administered 2016-03-29 – 2016-03-31 (×3): 8 [IU] via SUBCUTANEOUS
  Filled 2016-03-29 (×3): qty 8

## 2016-03-29 NOTE — Progress Notes (Addendum)
[facility] Hospitalist Daily Progress Note        Date Time: 03/29/2016  11:55 AM  Patient Name:Rachael [name]  ZDG:[id number]  PCP: Jocelyn Lamer, DO  Attending Physician:Zakariyah Freimark Glenis Smoker M.D.      Chief Complaint:      Chief Complaint   Patient presents with   . Altered Mental Status   . Shortness of Breath       Subjective:     Rachael [name] says that her shortness of breath is slowly improving ; no new complaint  No headache, No chest pain, No abdominal pain - No Nausea, No new weakness tingling or numbness.  Assessment/Plan   Acute hypoxic respiratory failure secondary to chronic obstructive pulmonary disease exacerbation and the patient with underlying bronchomalacia:community acquired pneumonia:  Positive for culture for rhinovirus   improved; reduced her dose of Solu-Medrol to 20 mg IV twice a day;Continue intravenous Rocephin and azithromycin and around-the-clock nebulizers  Await further recommendations by pulmonology  Continue try wean off oxygen and assess for home oxygen at rest and with ambulation  Lactic acidosis : Resolved with IV fluids  Altered mental status secondary to above: Resolved  Intermittent chest pain: Most likely secondary to above, resolved.  Patient has been ruled out.  Seen by cardiology.  Will need outpatient follow-up   Polymyalgia rheumatica: Patient on low-dose of prednisone at home, which is currently being withheld as the patient is currently on IV steroids  Diabetes mellitus 2: Hemoglobin A1c 7.5  Blood sugars improved  Reduced her dose of Lantus and continue sliding scale and monitor trends  Hypertension  Blood pressure stable.  Continue Cardizem and Toprol-XL  Hypothyroidism  Thyroid-stimulating hormone is normal.  Continue Synthroid  Gastroesophageal reflux disease on Pepcid  Obstructive sleep apnea.  CPAP at night    DVT Prohylaxis: Lovenox  Code Status: Full Code   Prognosis: Guarded  Type of  Admission:Inpatient  Estimated Length of Stay (including stay in the ER receiving treatment): Greater than 2 midnights  Medical Necessity for stay: Acute hypoxic respiratory failure, chronic obstructive pulmonary disease exacerbation, clinically quite pneumonia.  Lactic acidosis    Allergies:     Allergies   Allergen Reactions   . Tolectin [Tolmetin] Swelling   . Latex Itching       Physical Exam:   Vitals reviewed:   height is 1.524 m (5') and weight is 83.734 kg (184 lb 9.6 oz). Her temporal artery temperature is 97.4 F (36.3 C). Her blood pressure is 137/75 and her pulse is 83. Her respiration is 20 and oxygen saturation is 93%.   Body mass index is 36.05 kg/(m^2).  Filed Vitals:    03/29/16 0100 03/29/16 0500 03/29/16 0729 03/29/16 1000   BP: 130/75 134/72  137/75   Pulse: 79 85 83    Temp: 96.6 F (35.9 C) 97.4 F (36.3 C)  97.4 F (36.3 C)   TempSrc: Temporal Artery Temporal Artery  Temporal Artery   Resp: 18 18 20 20    Height:       Weight:  83.734 kg (184 lb 9.6 oz)     SpO2: 93% 91% 93%      Intake and Output Summary (Last 24 hours) at Date Time    Intake/Output Summary (Last 24 hours) at 03/29/16 1155  Last data filed at 03/29/16 0833   Gross per 24 hour   Intake    960 ml   Output      0 ml   Net  960 ml       Exam  Awake Alert, Oriented *3, No new F.N deficits, in no visible respiratory distress  NC.AT,PERRAL  Supple Neck,No JVD, No cervical lymphadenopathy appriciated.   Symmetrical Chest wall movement, improved air movement bilaterally, coarse, diffuse bilateral expiratory rhonchi   RRR,No Gallops,Rubs or new Murmurs, No Parasternal Heave  +ve B.Sounds, Abd Soft, Non tender, No organomegaly appriciated, No rebound -guarding or rigidity.  No Cyanosis, Clubbing or edema, No new Rash or bruise    Consult Input/Plan     Pulmonology  Cardiologist    Medications:     Current Facility-Administered Medications   Medication Dose Route Frequency Last Rate Last Dose   . acetaminophen (TYLENOL) tablet 650  mg  650 mg Oral Q4H PRN   650 mg at 03/25/16 2255    Or   . acetaminophen (TYLENOL) suppository 650 mg  650 mg Rectal Q4H PRN       . albuterol-ipratropium (DUO-NEB) 2.5-0.5(3) mg/3 mL nebulizer 3 mL  3 mL Nebulization Q4H WA   3 mL at 03/29/16 1115   . ALPRAZolam (XANAX) tablet 0.25 mg  0.25 mg Oral TID PRN   0.25 mg at 03/28/16 1759   . azithromycin (ZITHROMAX) 500 mg in sodium chloride 0.9 % 250 mL IVPB  500 mg Intravenous Q24H SCH 250 mL/hr at 03/28/16 1545 500 mg at 03/28/16 1545   . benzocaine-menthol (CEPACOL) lozenge 1 lozenge  1 lozenge Buccal Q1H PRN   1 lozenge at 03/27/16 2140   . cefTRIAXone (ROCEPHIN) 1 g in sodium chloride 0.9 % 100 mL IVPB mini-bag plus  1 g Intravenous Q24H SCH 200 mL/hr at 03/29/16 1025 1 g at 03/29/16 1025   . dextrose (GLUCOSE) 40 % oral gel 15 g of glucose  15 g of glucose Oral PRN        And   . dextrose (D10W) 10% bolus 125 mL  125 mL Intravenous PRN        And   . glucagon (rDNA) (GLUCAGEN) injection 1 mg  1 mg Intramuscular PRN       . dilTIAZem (CARDIZEM CD) 24 hr capsule 240 mg  240 mg Oral Daily   240 mg at 03/29/16 1025   . docusate sodium (COLACE) capsule 100 mg  100 mg Oral Q12H   100 mg at 03/29/16 1026   . DULoxetine (CYMBALTA) DR capsule 60 mg  60 mg Oral BID   60 mg at 03/29/16 1026   . enoxaparin (LOVENOX) syringe 40 mg  40 mg Subcutaneous Daily   40 mg at 03/28/16 2115   . ezetimibe (ZETIA) tablet 10 mg  10 mg Oral Daily   10 mg at 03/29/16 1026   . famotidine (PEPCID) tablet 20 mg  20 mg Oral Daily   20 mg at 03/28/16 2116   . fluticasone furoate-vilanterol (BREO ELLIPTA) 100-25 MCG/INH 1 puff  1 puff Inhalation QAM   1 puff at 03/29/16 0730   . folic acid (FOLVITE) tablet 1 mg  1 mg Oral Daily   1 mg at 03/29/16 1026   . gabapentin (NEURONTIN) capsule 300 mg  300 mg Oral BID   300 mg at 03/29/16 1026   . guaiFENesin (MUCINEX) 12 hr tablet 600 mg  600 mg Oral Q12H SCH   600 mg at 03/29/16 1026   . losartan (COZAAR) tablet 100 mg  100 mg Oral QAM   100 mg at  03/29/16 0811    And   . hydroCHLOROthiazide (  HYDRODIURIL) tablet 25 mg  25 mg Oral QAM   25 mg at 03/29/16 0811   . HYDROcodone-homatropine (HYCODAN) 5-1.5 MG/5ML syrup 5 mL  5 mL Oral Q4H PRN   5 mL at 03/27/16 2358   . insulin glargine (LANTUS) injection 8 Units  8 Units Subcutaneous QHS       . insulin lispro (HumaLOG) injection 1-6 Units  1-6 Units Subcutaneous QHS PRN   4 Units at 03/28/16 2117   . insulin lispro (HumaLOG) injection 2-10 Units  2-10 Units Subcutaneous TID AC PRN   4 Units at 03/28/16 1704   . lactobacillus/streptococcus (RISAQUAD) capsule 1 capsule  1 capsule Oral Daily   1 capsule at 03/29/16 1025   . lactulose (CHRONULAC) 10 GM/15ML solution 10 g  10 g Oral QD PRN       . levothyroxine (SYNTHROID, LEVOTHROID) tablet 50 mcg  50 mcg Oral Daily at 0600   50 mcg at 03/29/16 0613   . methylPREDNISolone sodium succinate (Solu-MEDROL) injection 20 mg  20 mg Intravenous Q12H       . metoprolol (LOPRESSOR) injection 5 mg  5 mg Intravenous 4X Daily PRN       . metoprolol XL (TOPROL-XL) 24 hr tablet 50 mg  50 mg Oral Daily   50 mg at 03/29/16 1026   . naloxone (NARCAN) injection 0.2 mg  0.2 mg Intravenous PRN       . nitroglycerin (NITROSTAT) SL tablet 0.4 mg  0.4 mg Sublingual Q5 Min PRN       . ondansetron (ZOFRAN) injection 4 mg  4 mg Intravenous Q6H PRN       . pantoprazole (PROTONIX) EC tablet 40 mg  40 mg Oral Daily   40 mg at 03/29/16 0811   . polyethylene glycol (MIRALAX) packet 17 g  17 g Oral QD PRN       . traMADol (ULTRAM) tablet 50 mg  50 mg Oral BID PRN   50 mg at 03/29/16 0811   . traZODone (DESYREL) tablet 100 mg  100 mg Oral QHS   100 mg at 03/28/16 2116          Labs:reviewed     Results     Procedure Component Value Units Date/Time    Glucose Whole Blood - POCT [[id number]]  (Abnormal) Collected:  03/29/16 0751     POCT - Glucose Whole blood 133 (H) mg/dL Updated:  91/47/82 9562    Basic Metabolic Panel [[id number]]  (Abnormal) Collected:  03/29/16 0355    Specimen Information:  Blood  Updated:  03/29/16 0648     Glucose 163 (H) mg/dL      BUN 69.6 mg/dL      Creatinine 0.7 mg/dL      Calcium 8.8 mg/dL      Sodium 295 mEq/L      Potassium 3.9 mEq/L      Chloride 102 mEq/L      CO2 27 mEq/L      Anion Gap 10.0     GFR [[id number]] Collected:  03/29/16 0355     EGFR >60.0 Updated:  03/29/16 0648    Respiratory Pathogen Panel, PCR (Film Array) [[id number]]  (Abnormal) Collected:  03/28/16 1828     Adenovirus Not Detected Updated:  03/29/16 0617     Coronavirus 229E Not Detected      Coronavirus HKU1 Not Detected      Coronavirus NL63 Not Detected      Coronavirus OC43 Not Detected  Human Metapneumovirus Not Detected      Human Rhinovirus/Enterovirus Detected (A)      Influenza A Not Detected      Influenza A/H1 Not Detected      Influenza AH1 - 2009 Not Detected      Influenza A/H3 Not Detected      Influenza B Not Detected      Parainfluenza Virus 1 Not Detected      Parainfluenza Virus 2 Not Detected      Parainfluenza Virus 3 Not Detected      Parainfluenza Virus 4 Not Detected      Respiratory Syncytial Virus Not Detected      Bordetella pertussis Not Detected      Chlamydophila pneumoniae Not Detected      Mycoplasma pneumoniae Not Detected      Source NP Swab     Glucose Whole Blood - POCT [[id number]]  (Abnormal) Collected:  03/28/16 2051     POCT - Glucose Whole blood 266 (H) mg/dL Updated:  13/24/40 1027    CULTURE BLOOD AEROBIC AND ANAEROBIC #2 [[id number]] Collected:  03/25/16 1500    Specimen Information:  Arm from Blood Updated:  03/28/16 2021    Narrative:      ORDER#: [id number]                                    ORDERED BY: SHIFFERT, KENT  SOURCE: Blood right ac                               COLLECTED:  03/25/16 15:00  ANTIBIOTICS AT COLL.:                                RECEIVED :  03/25/16 19:40  Culture Blood Aerobic and Anaerobic        PRELIM      03/28/16 20:21  03/26/16   No Growth after 1 day/s of incubation.  03/27/16   No Growth after 2 day/s of incubation.  03/28/16   No  Growth after 3 day/s of incubation.      CULTURE BLOOD AEROBIC AND ANAEROBIC #1 [[id number]] Collected:  03/25/16 1252    Specimen Information:  Arm from Blood Updated:  03/28/16 1821    Narrative:      ORDER#: [id number]                                    ORDERED BY: SHIFFERT, KENT  SOURCE: Blood LEFT AC                                COLLECTED:  03/25/16 12:52  ANTIBIOTICS AT COLL.:                                RECEIVED :  03/25/16 17:53  Culture Blood Aerobic and Anaerobic        PRELIM      03/28/16 18:21  03/26/16   No Growth after 1 day/s of incubation.  03/27/16   No Growth after 2 day/s of incubation.  03/28/16   No Growth after 3 day/s of  incubation.      Glucose Whole Blood - POCT [[id number]]  (Abnormal) Collected:  03/28/16 1646     POCT - Glucose Whole blood 242 (H) mg/dL Updated:  40/98/11 9147    Glucose Whole Blood - POCT [[id number]]  (Abnormal) Collected:  03/28/16 1125     POCT - Glucose Whole blood 333 (H) mg/dL Updated:  86/57/84 6962          Rads:reviewed   Radiological Procedure reviewed.  Radiology Results (24 Hour)     ** No results found for the last 24 hours. **          Multi-Disciplinary Care Input:   Case management notes: No Patient Care Coordination Note on file.     PT/OT/ST notes:          Signed by: Anselmo Pickler  03/29/2016 11:55 AM         This note was generated by the Epic EMR system/ Dragon speech recognition and may contain inherent errors or omissions not intended by the user. Grammatical errors, random word insertions, deletions, pronoun errors and incomplete sentences are occasional consequences of this technology due to software limitations. Not all errors are caught or corrected. If there are questions or concerns about the content of this note or information contained within the body of this dictation they should be addressed directly with the author for clarification

## 2016-03-29 NOTE — Progress Notes (Signed)
Pulmonary Progress Note  "[facility]"    Date Time: 03/29/2016 7:05 PM  Patient Name: Rachael [name]  Attending Physician: Anselmo Pickler, MD      Assessment:     Patient Active Problem List   Diagnosis   . COPD exacerbation   . Acute respiratory failure with hypoxia   . Lactic acidosis   . Acute bronchitis due to Rhinovirus           Plan:   Can switch to oral Prednisone in AM.  Will switch to oral Zithromax and treat for total of 7 days for secondary infection.  If stable consider discharge in 24-48 hours.        Interval History   HPI: Patient is still coughing and wheezing but seems to be slowly improving.  Positive for Rhin virus.  Suspect bronchospasm secondary to viral bronchitis.       Interval History/24 hr. Events: none    Physical Exam:   Vital signs for last 24 hours:  Temp:  [96.6 F (35.9 C)-97.9 F (36.6 C)] 97.1 F (36.2 C)  Heart Rate:  [77-85] 77  Resp Rate:  [18-20] 20  BP: (101-150)/(53-75) 101/53 mmHg  FiO2:  [32 %] 32 %    Intake and Output Summary (Last 24 hours) at Date Time    Intake/Output Summary (Last 24 hours) at 03/29/16 1905  Last data filed at 03/29/16 1745   Gross per 24 hour   Intake    920 ml   Output      0 ml   Net    920 ml       General: awake, alert, oriented x 3; no acute distress; able to speak in complete sentences without accessory muscle use; in   HEENT: pupils equal with EOMI; no thyromegaly, no cervical LN, no thrush.  Cardiovascular: regular rate and rhythm, no murmurs, rubs or gallops. No P2/S2 present. No R sided heave. No JVD, no HJR.  Lungs:bilateral ronchi, better from yesterday.  Abdomen: soft, non-tender, non-distended; no palpable masses, no hepatosplenomegaly, normoactive bowel sounds, no rebound or guarding  Extremities: no clubbing, cyanosis, or edema.      Meds:   Scheduled Meds:  Current Facility-Administered Medications   Medication Dose Route Frequency   . albuterol-ipratropium  3 mL Nebulization Q4H WA   .  azithromycin  500 mg Intravenous Q24H SCH   . cefTRIAXone  1 g Intravenous Q24H SCH   . dilTIAZem  240 mg Oral Daily   . docusate sodium  100 mg Oral Q12H   . DULoxetine  60 mg Oral BID   . enoxaparin  40 mg Subcutaneous Daily   . ezetimibe  10 mg Oral Daily   . famotidine  20 mg Oral Daily   . fluticasone furoate-vilanterol  1 puff Inhalation QAM   . folic acid  1 mg Oral Daily   . gabapentin  300 mg Oral BID   . guaiFENesin  600 mg Oral Q12H SCH   . losartan  100 mg Oral QAM    And   . hydroCHLOROthiazide  25 mg Oral QAM   . insulin glargine  8 Units Subcutaneous QHS   . lactobacillus/streptococcus  1 capsule Oral Daily   . levothyroxine  50 mcg Oral Daily at 0600   . methylPREDNISolone  20 mg Intravenous Q12H   . metoprolol XL  50 mg Oral Daily   . pantoprazole  40 mg Oral Daily   . traZODone  100  mg Oral QHS     Continuous Infusions:   PRN Meds:.acetaminophen **OR** acetaminophen, ALPRAZolam, benzocaine-menthol, Nursing communication: Adult Hypoglycemia Treatment Algorithm **AND** dextrose **AND** dextrose **AND** glucagon (rDNA), HYDROcodone-homatropine, insulin lispro, insulin lispro, lactulose, metoprolol, naloxone, nitroglycerin, ondansetron, polyethylene glycol, traMADol      Labs:     Results     Procedure Component Value Units Date/Time    CULTURE BLOOD AEROBIC AND ANAEROBIC #1 [[id number]] Collected:  03/25/16 1252    Specimen Information:  Arm from Blood Updated:  03/29/16 1821    Narrative:      ORDER#: [id number]                                    ORDERED BY: SHIFFERT, KENT  SOURCE: Blood LEFT AC                                COLLECTED:  03/25/16 12:52  ANTIBIOTICS AT COLL.:                                RECEIVED :  03/25/16 17:53  Culture Blood Aerobic and Anaerobic        PRELIM      03/29/16 18:21  03/26/16   No Growth after 1 day/s of incubation.  03/27/16   No Growth after 2 day/s of incubation.  03/28/16   No Growth after 3 day/s of incubation.  03/29/16   No Growth after 4 day/s of  incubation.      Glucose Whole Blood - POCT [[id number]]  (Abnormal) Collected:  03/29/16 1605     POCT - Glucose Whole blood 282 (H) mg/dL Updated:  08/65/78 4696    Glucose Whole Blood - POCT [[id number]]  (Abnormal) Collected:  03/29/16 1155     POCT - Glucose Whole blood 287 (H) mg/dL Updated:  44/01/02 7253    Glucose Whole Blood - POCT [[id number]]  (Abnormal) Collected:  03/29/16 0751     POCT - Glucose Whole blood 133 (H) mg/dL Updated:  25/95/63 8756    Basic Metabolic Panel [[id number]]  (Abnormal) Collected:  03/29/16 0355    Specimen Information:  Blood Updated:  03/29/16 0648     Glucose 163 (H) mg/dL      BUN 41.6 mg/dL      Creatinine 0.7 mg/dL      Calcium 8.8 mg/dL      Sodium 606 mEq/L      Potassium 3.9 mEq/L      Chloride 102 mEq/L      CO2 27 mEq/L      Anion Gap 10.0     GFR [[id number]] Collected:  03/29/16 0355     EGFR >60.0 Updated:  03/29/16 0648    Respiratory Pathogen Panel, PCR (Film Array) [[id number]]  (Abnormal) Collected:  03/28/16 1828     Adenovirus Not Detected Updated:  03/29/16 0617     Coronavirus 229E Not Detected      Coronavirus HKU1 Not Detected      Coronavirus NL63 Not Detected      Coronavirus OC43 Not Detected      Human Metapneumovirus Not Detected      Human Rhinovirus/Enterovirus Detected (A)      Influenza A Not Detected      Influenza A/H1 Not Detected  Influenza AH1 - 2009 Not Detected      Influenza A/H3 Not Detected      Influenza B Not Detected      Parainfluenza Virus 1 Not Detected      Parainfluenza Virus 2 Not Detected      Parainfluenza Virus 3 Not Detected      Parainfluenza Virus 4 Not Detected      Respiratory Syncytial Virus Not Detected      Bordetella pertussis Not Detected      Chlamydophila pneumoniae Not Detected      Mycoplasma pneumoniae Not Detected      Source NP Swab     Glucose Whole Blood - POCT [[id number]]  (Abnormal) Collected:  03/28/16 2051     POCT - Glucose Whole blood 266 (H) mg/dL Updated:  40/98/11 9147    CULTURE BLOOD AEROBIC AND  ANAEROBIC #2 [[id number]] Collected:  03/25/16 1500    Specimen Information:  Arm from Blood Updated:  03/28/16 2021    Narrative:      ORDER#: [id number]                                    ORDERED BY: SHIFFERT, KENT  SOURCE: Blood right ac                               COLLECTED:  03/25/16 15:00  ANTIBIOTICS AT COLL.:                                RECEIVED :  03/25/16 19:40  Culture Blood Aerobic and Anaerobic        PRELIM      03/28/16 20:21  03/26/16   No Growth after 1 day/s of incubation.  03/27/16   No Growth after 2 day/s of incubation.  03/28/16   No Growth after 3 day/s of incubation.              Radiology:     Radiology Results (24 Hour)     ** No results found for the last 24 hours. **            Signed by: Kendra Opitz, MD  Date/Time: 03/29/2016 7:05 PM

## 2016-03-29 NOTE — Plan of Care (Signed)
Problem: Safety  Goal: Patient will be free from injury during hospitalization  Outcome: Progressing  Intervention: Assess patient's risk for falls and implement fall prevention plan of care per policy  .  Intervention: Provide and maintain safe environment  .  Intervention: Hourly rounding.  .      Problem: Pain  Goal: Patient's pain/discomfort is manageable  Outcome: Progressing  Intervention: Include patient/family/caregiver in decisions related to pain management  .  Intervention: Offer non-pharmocological pain management interventions  .

## 2016-03-29 NOTE — Plan of Care (Signed)
Problem: Safety  Goal: Patient will be free from injury during hospitalization  Outcome: Progressing  Patient assisted to bathroom as needed, currently resting in bed, call bell within reach and bed alarm set.

## 2016-03-30 LAB — GFR: EGFR: >60

## 2016-03-30 LAB — BASIC METABOLIC PANEL
Anion Gap: 10 (ref 5.0–15.0)
BUN: 13.4 mg/dL (ref 7.0–19.0)
CO2: 31 mEq/L — ABNORMAL HIGH (ref 22–29)
Calcium: 9 mg/dL (ref 7.9–10.2)
Chloride: 99 mEq/L — ABNORMAL LOW (ref 100–111)
Creatinine: 0.8 mg/dL (ref 0.6–1.0)
Glucose: 161 mg/dL — ABNORMAL HIGH (ref 70–100)
Potassium: 3.9 mEq/L (ref 3.5–5.1)
Sodium: 140 mEq/L (ref 136–145)

## 2016-03-30 LAB — GLUCOSE WHOLE BLOOD - POCT
Whole Blood Glucose POCT: 121 mg/dL — ABNORMAL HIGH (ref 70–100)
Whole Blood Glucose POCT: 168 mg/dL — ABNORMAL HIGH (ref 70–100)
Whole Blood Glucose POCT: 146 mg/dL — ABNORMAL HIGH (ref 70–100)
Whole Blood Glucose POCT: 335 mg/dL — ABNORMAL HIGH (ref 70–100)

## 2016-03-30 MED ORDER — POTASSIUM CHLORIDE CRYS ER 20 MEQ PO TBCR
40.0000 meq | EXTENDED_RELEASE_TABLET | Freq: Once | ORAL | Status: AC
Start: 2016-03-30 — End: 2016-03-30
  Administered 2016-03-30: 40 meq via ORAL
  Filled 2016-03-30: qty 2

## 2016-03-30 NOTE — PT Progress Note (Signed)
[facility]  [address]  [place], [place]. [address]    Department of Rehabilitation  [phone]    Physical Therapy Daily Treatment Note    Patient: Rachael [name]    MRN#: [id number]     [id number]    Time of Treatment: Start Time: 1440 Stop Time: 1455  Time Calculation: 15 min    PT Visit Number: 3    Patient's medical condition is appropriate for Physical Therapy intervention at this time.  Precautions: falls      Assessment:  Pt continues to present with decreased functional endurance which is significantly limiting pt's ability to engage in mobility.  Pt is not safe to return home, and will benefit from inpatient rehab upon discharge prior to return home.  Patient continues to present with the following deficits:  Decreased endurance/activity tolerance;Decreased LE strength     Progress: Progressing toward goals   Patient Goal: to go home      Plan:  Continue with Physical Therapy services to address functional mobility, endurance, and strength deficits.  Focus next session on: Endurance training;Equipment eval/education;LE strengthening/ROM;Exercise;Gait training;Stair training   PT Frequency: 3-4x/wk     Based on today's session patient's discharge recommendation is the following: Home with home health PT and supervision.             Subjective: Patient is agreeable to participation in the therapy session, c/o feeling very tired.  Nursing clears patient for therapy.   Pain Assessment: No/denies pain       Objective:  Observation of patient vital signs performed by nursing staff, see flow chart for details.  Patient is in bed with telemetry in place.  Pt seen for functional activities and exercises as noted:     Cognition/Neuro Status  Arousal/Alertness: Appropriate responses to stimuli  Attention Span: Appears intact  Orientation Level: Oriented X4  Memory: Appears intact  Following Commands: Follows all commands and directions without difficulty  Safety Awareness:  independent  Insights: Fully aware of deficits;Educated in safety awareness  Problem Solving: Able to problem solve independently  Behavior: attentive;calm;cooperative  Motor Planning: intact  Coordination: intact  Hand Dominance: right handed    Functional Mobility  Supine to Sit: Supervision;HOB raised  Scooting to EOB: Supervision  Sit to Supine: Supervision  Sit to Stand: Supervision  Stand to Sit: Supervision     Locomotion  Ambulation: Supervision  Ambulation Distance: 20 Feet x 2      Treatment Activities: Bed mobility, transfers, balance assessment, LE AE ex's, safety awareness and cognition assessed, and activity tolerance noted.      Educated the patient to role of physical therapy, plan of care, goals of therapy and safety.  Encouraged to perform LE therex throughout the day to decrease effects of immobility. Encouraged to sit OOB as tolerated for pulmonary hygiene. Pt voiced understanding but stated she is just too tired at this time.  Patient left without needs and call bell within reach. RN notified of session outcome.

## 2016-03-30 NOTE — Progress Notes (Signed)
[facility] Hospitalist Daily Progress Note        Date Time: 03/30/2016  11:33 AM  Patient Name:Rachael [name]  VWU:[id number]  PCP: Jocelyn Lamer, DO  Attending Physician:Rossi Silvestro Glenis Smoker M.D.      Chief Complaint:      Chief Complaint   Patient presents with   . Altered Mental Status   . Shortness of Breath       Subjective:     Sharin Dibuono reports 50 percent improvement since admission; still feeling significantly short of breath and O2 sats dropping to 80 with little exertion while walking to the bathroom .currently on 3 L   No headache, No chest pain, No abdominal pain - No Nausea, No new weakness tingling or numbness.  Assessment/Plan   80 years old lady with history of bronchomalacia admitted with hypoxic respiratory failure secondary to his significant reactive airway disease and a positive culture for rhinovirus.  Patient does not use oxygen at baseline, but is requiring up to 3 L as she drops her oxygen sats into the 80s with minimal exertion.  Slowly improving.  Pulmonology following      Acute hypoxic respiratory failure secondary to chronic obstructive pulmonary disease exacerbation and the patient with underlying bronchomalacia;Positive for culture for rhinovirus  Slowly improving but continues to be significantly hypoxic and does not feel comfortable going home today   Continue Solu-Medrol to 20 mg IV twice a day;Continue intravenous Rocephin and azithromycin and around-the-clock nebulizers  pulmonology following  Continue try wean off oxygen and assess for home oxygen at rest and with ambulation  Lactic acidosis : Resolved with IV fluids  Altered mental status secondary to above: Resolved  Intermittent chest pain: Most likely secondary to above, resolved.  Patient has been ruled out.  Seen by cardiology.  Will need outpatient follow-up   Polymyalgia rheumatica: Patient on low-dose of prednisone at home, which is currently being withheld as  the patient is currently on IV steroids  Diabetes mellitus 2: Hemoglobin A1c 7.5  Metformin on hold ; Blood sugars improved with reducing dose of steroids  Continue Lantus at 8 units at bedtime and continue sliding scale and monitor trends  Hypertension  Blood pressure stable.  Continue Cardizem and Toprol-XL  Hypothyroidism  Thyroid-stimulating hormone is normal.  Continue Synthroid  Gastroesophageal reflux disease on Pepcid  Obstructive sleep apnea.  CPAP at night    DVT Prohylaxis: Lovenox  Code Status: Full Code   Prognosis: Guarded  Type of Admission:Inpatient  Estimated Length of Stay (including stay in the ER receiving treatment): Greater than 2 midnights  Medical Necessity for stay: Acute hypoxic respiratory failure, chronic obstructive pulmonary disease exacerbation, clinically quite pneumonia.  Lactic acidosis    Allergies:     Allergies   Allergen Reactions   . Tolectin [Tolmetin] Swelling   . Latex Itching       Physical Exam:   Vitals reviewed:   height is 1.524 m (5') and weight is 83.734 kg (184 lb 9.6 oz). Her temporal artery temperature is 97.3 F (36.3 C). Her blood pressure is 150/84 and her pulse is 90. Her respiration is 20 and oxygen saturation is 94%.   Body mass index is 36.05 kg/(m^2).  Filed Vitals:    03/30/16 0206 03/30/16 0623 03/30/16 0716 03/30/16 0917   BP: 165/82 163/80  150/84   Pulse: 71 72 72 90   Temp: 97.2 F (36.2 C) 98.2 F (36.8 C)  97.3 F (36.3 C)   TempSrc: Temporal Artery  Temporal Artery  Temporal Artery   Resp: 20 20 20 20    Height:       Weight:       SpO2: 92% 95% 91% 94%     Intake and Output Summary (Last 24 hours) at Date Time    Intake/Output Summary (Last 24 hours) at 03/30/16 1133  Last data filed at 03/30/16 0200   Gross per 24 hour   Intake    800 ml   Output      0 ml   Net    800 ml       Exam  Awake Alert, Oriented *3, No new F.N deficits, in no visible respiratory distress  NC.AT,PERRAL  Supple Neck,No JVD, No cervical lymphadenopathy appriciated.    Symmetrical Chest wall movement, improved air movement bilaterally, coarse, diffuse bilateral expiratory rhonchi   RRR,No Gallops,Rubs or new Murmurs, No Parasternal Heave  +ve B.Sounds, Abd Soft, Non tender, No organomegaly appriciated, No rebound -guarding or rigidity.  No Cyanosis, Clubbing or edema, No new Rash or bruise    Consult Input/Plan     Pulmonology  Cardiologist    Medications:     Current Facility-Administered Medications   Medication Dose Route Frequency Last Rate Last Dose   . acetaminophen (TYLENOL) tablet 650 mg  650 mg Oral Q4H PRN   650 mg at 03/25/16 2255    Or   . acetaminophen (TYLENOL) suppository 650 mg  650 mg Rectal Q4H PRN       . albuterol-ipratropium (DUO-NEB) 2.5-0.5(3) mg/3 mL nebulizer 3 mL  3 mL Nebulization Q4H WA   3 mL at 03/30/16 1118   . ALPRAZolam Prudy Feeler) tablet 0.25 mg  0.25 mg Oral TID PRN   0.25 mg at 03/30/16 0205   . azithromycin (ZITHROMAX) tablet 500 mg  500 mg Oral Daily   500 mg at 03/30/16 1010   . benzocaine-menthol (CEPACOL) lozenge 1 lozenge  1 lozenge Buccal Q1H PRN   1 lozenge at 03/27/16 2140   . cefTRIAXone (ROCEPHIN) 1 g in sodium chloride 0.9 % 100 mL IVPB mini-bag plus  1 g Intravenous Q24H SCH 200 mL/hr at 03/30/16 1010 1 g at 03/30/16 1010   . dextrose (GLUCOSE) 40 % oral gel 15 g of glucose  15 g of glucose Oral PRN        And   . dextrose (D10W) 10% bolus 125 mL  125 mL Intravenous PRN        And   . glucagon (rDNA) (GLUCAGEN) injection 1 mg  1 mg Intramuscular PRN       . dilTIAZem (CARDIZEM CD) 24 hr capsule 240 mg  240 mg Oral Daily   240 mg at 03/30/16 1009   . docusate sodium (COLACE) capsule 100 mg  100 mg Oral Q12H   100 mg at 03/30/16 1008   . DULoxetine (CYMBALTA) DR capsule 60 mg  60 mg Oral BID   60 mg at 03/30/16 1009   . enoxaparin (LOVENOX) syringe 40 mg  40 mg Subcutaneous Daily   40 mg at 03/29/16 2124   . ezetimibe (ZETIA) tablet 10 mg  10 mg Oral Daily   10 mg at 03/30/16 1010   . famotidine (PEPCID) tablet 20 mg  20 mg Oral Daily    20 mg at 03/29/16 2125   . fluticasone furoate-vilanterol (BREO ELLIPTA) 100-25 MCG/INH 1 puff  1 puff Inhalation QAM   1 puff at 03/30/16 0716   . folic acid (FOLVITE) tablet 1  mg  1 mg Oral Daily   1 mg at 03/30/16 1008   . gabapentin (NEURONTIN) capsule 300 mg  300 mg Oral BID   300 mg at 03/30/16 1010   . guaiFENesin (MUCINEX) 12 hr tablet 600 mg  600 mg Oral Q12H SCH   600 mg at 03/30/16 1010   . losartan (COZAAR) tablet 100 mg  100 mg Oral QAM   100 mg at 03/30/16 0852    And   . hydroCHLOROthiazide (HYDRODIURIL) tablet 25 mg  25 mg Oral QAM   25 mg at 03/30/16 0853   . HYDROcodone-homatropine (HYCODAN) 5-1.5 MG/5ML syrup 5 mL  5 mL Oral Q4H PRN   5 mL at 03/27/16 2358   . insulin glargine (LANTUS) injection 8 Units  8 Units Subcutaneous QHS   8 Units at 03/29/16 2215   . insulin lispro (HumaLOG) injection 1-6 Units  1-6 Units Subcutaneous QHS PRN   4 Units at 03/29/16 2215   . insulin lispro (HumaLOG) injection 2-10 Units  2-10 Units Subcutaneous TID AC PRN   6 Units at 03/29/16 1633   . lactobacillus/streptococcus (RISAQUAD) capsule 1 capsule  1 capsule Oral Daily   1 capsule at 03/30/16 1009   . lactulose (CHRONULAC) 10 GM/15ML solution 10 g  10 g Oral QD PRN       . levothyroxine (SYNTHROID, LEVOTHROID) tablet 50 mcg  50 mcg Oral Daily at 0600   50 mcg at 03/30/16 0614   . methylPREDNISolone sodium succinate (Solu-MEDROL) injection 20 mg  20 mg Intravenous Q12H   20 mg at 03/30/16 0611   . metoprolol (LOPRESSOR) injection 5 mg  5 mg Intravenous 4X Daily PRN       . metoprolol XL (TOPROL-XL) 24 hr tablet 50 mg  50 mg Oral Daily   50 mg at 03/30/16 1010   . naloxone (NARCAN) injection 0.2 mg  0.2 mg Intravenous PRN       . nitroglycerin (NITROSTAT) SL tablet 0.4 mg  0.4 mg Sublingual Q5 Min PRN       . ondansetron (ZOFRAN) injection 4 mg  4 mg Intravenous Q6H PRN       . pantoprazole (PROTONIX) EC tablet 40 mg  40 mg Oral Daily   40 mg at 03/30/16 0845   . polyethylene glycol (MIRALAX) packet 17 g  17 g Oral  QD PRN       . traMADol (ULTRAM) tablet 50 mg  50 mg Oral BID PRN   50 mg at 03/29/16 2124   . traZODone (DESYREL) tablet 100 mg  100 mg Oral QHS   100 mg at 03/28/16 2116          Labs:reviewed     Results     Procedure Component Value Units Date/Time    Glucose Whole Blood - POCT [[id number]]  (Abnormal) Collected:  03/30/16 0725     POCT - Glucose Whole blood 121 (H) mg/dL Updated:  40/98/11 9147    GFR [[id number]] Collected:  03/30/16 0525     EGFR >60.0 Updated:  03/30/16 0653    Basic Metabolic Panel [[id number]]  (Abnormal) Collected:  03/30/16 0525    Specimen Information:  Blood Updated:  03/30/16 0652     Glucose 161 (H) mg/dL      BUN 29.5 mg/dL      Creatinine 0.8 mg/dL      Calcium 9.0 mg/dL      Sodium 284 mEq/L      Potassium 3.9 mEq/L  Chloride 99 (L) mEq/L      CO2 31 (H) mEq/L      Anion Gap 10.0     Glucose Whole Blood - POCT [[id number]]  (Abnormal) Collected:  03/29/16 2046     POCT - Glucose Whole blood 281 (H) mg/dL Updated:  40/98/11 9147    CULTURE BLOOD AEROBIC AND ANAEROBIC #2 [[id number]] Collected:  03/25/16 1500    Specimen Information:  Arm from Blood Updated:  03/29/16 2021    Narrative:      ORDER#: [id number]                                    ORDERED BY: SHIFFERT, KENT  SOURCE: Blood right ac                               COLLECTED:  03/25/16 15:00  ANTIBIOTICS AT COLL.:                                RECEIVED :  03/25/16 19:40  Culture Blood Aerobic and Anaerobic        PRELIM      03/29/16 20:21  03/26/16   No Growth after 1 day/s of incubation.  03/27/16   No Growth after 2 day/s of incubation.  03/28/16   No Growth after 3 day/s of incubation.  03/29/16   No Growth after 4 day/s of incubation.      CULTURE BLOOD AEROBIC AND ANAEROBIC #1 [[id number]] Collected:  03/25/16 1252    Specimen Information:  Arm from Blood Updated:  03/29/16 1821    Narrative:      ORDER#: [id number]                                    ORDERED BY: SHIFFERT, KENT  SOURCE: Blood LEFT AC                                 COLLECTED:  03/25/16 12:52  ANTIBIOTICS AT COLL.:                                RECEIVED :  03/25/16 17:53  Culture Blood Aerobic and Anaerobic        PRELIM      03/29/16 18:21  03/26/16   No Growth after 1 day/s of incubation.  03/27/16   No Growth after 2 day/s of incubation.  03/28/16   No Growth after 3 day/s of incubation.  03/29/16   No Growth after 4 day/s of incubation.      Glucose Whole Blood - POCT [[id number]]  (Abnormal) Collected:  03/29/16 1605     POCT - Glucose Whole blood 282 (H) mg/dL Updated:  42/59/56 3875    Glucose Whole Blood - POCT [[id number]]  (Abnormal) Collected:  03/29/16 1155     POCT - Glucose Whole blood 287 (H) mg/dL Updated:  84/16/60 6301          Rads:reviewed   Radiological Procedure reviewed.  Radiology Results (24 Hour)     ** No results found for the last 24 hours. **  Multi-Disciplinary Care Input:   Case management notes: No Patient Care Coordination Note on file.     PT/OT/ST notes:          Signed by: Anselmo Pickler  03/30/2016 11:33 AM         This note was generated by the Epic EMR system/ Dragon speech recognition and may contain inherent errors or omissions not intended by the user. Grammatical errors, random word insertions, deletions, pronoun errors and incomplete sentences are occasional consequences of this technology due to software limitations. Not all errors are caught or corrected. If there are questions or concerns about the content of this note or information contained within the body of this dictation they should be addressed directly with the author for clarification

## 2016-03-30 NOTE — Progress Notes (Signed)
Pulmonary Progress Note                              "[facility]"    Date Time: 03/30/2016 8:05 AM  Patient Name: Rachael [name]  Attending Physician: Anselmo Pickler, MD      Assessment:     Patient Active Problem List   Diagnosis   . COPD exacerbation   . Acute respiratory failure with hypoxia   . Lactic acidosis   . Acute bronchitis due to Rhinovirus     -Chronic obstructive pulmonary disease exacerbation +/- PNA, now improved and remains afebrile.  Chest x-ray was unremarkable.  -Hypoxemia, now improved.  -Polymyalgia rheumatica.  -Diabetes  -Severe OSA on CPAP.      Plan:   -Continue current treatment with tapering IV Solu-Medrol, DuoNeb, Mucomyst,  -Wean O2  -Respiratory viral panel showed rhinovirus  -Ambulate as tolerated.  -Continue Breo   -Continue CPAP.  -If patient able to ambulate with no significant difficulty, and clinically improved, from pulmonary standpoint can be discharged today or tomorrow.  She will benefit from evaluation for home O2 prior to the discharge to keep saturation above 88 percent.  -Follow-up as outpatient in 2 weeks in my office.    Interval History   HPI: Patient is feeling better.  Continues to have cough with difficulty expectorating.  Wheezing has improved.  Denies any chest pain.       Interval History/24 hr. Events: Non-  Physical Exam:   Vital signs for last 24 hours:  Temp:  [97 F (36.1 C)-98.2 F (36.8 C)] 98.2 F (36.8 C)  Heart Rate:  [71-94] 72  Resp Rate:  [20] 20  BP: (101-165)/(53-82) 163/80 mmHg    Intake and Output Summary (Last 24 hours) at Date Time    Intake/Output Summary (Last 24 hours) at 03/30/16 0805  Last data filed at 03/30/16 0200   Gross per 24 hour   Intake   1040 ml   Output      0 ml   Net   1040 ml       General: awake, alert, oriented x 3; no acute distress; able to speak in complete sentences without accessory muscle use;   HEENT: pupils equal with EOMI; no thyromegaly, no cervical LN, no  thrush.  Cardiovascular: regular rate and rhythm, no murmurs, rubs or gallops. No P2/S2 present. No R sided heave. No JVD.  Lungs:.  Minimal bilateral expiratory wheezing.  overall diminished breath sounds.    Abdomen: soft, non-tender, non-distended; no palpable masses, no hepatosplenomegaly, normoactive bowel sounds, no rebound or guarding  Extremities: no clubbing, cyanosis, or edema.  Neuro: Normal sensory and motor systems.  Derm: No rashes.  Musculoskeletal: nl ROM, no muscle weakness.      Meds:   Scheduled Meds:  Current Facility-Administered Medications   Medication Dose Route Frequency   . albuterol-ipratropium  3 mL Nebulization Q4H WA   . azithromycin  500 mg Oral Daily   . cefTRIAXone  1 g Intravenous Q24H SCH   . dilTIAZem  240 mg Oral Daily   . docusate sodium  100 mg Oral Q12H   . DULoxetine  60 mg Oral BID   . enoxaparin  40 mg Subcutaneous Daily   . ezetimibe  10 mg Oral Daily   . famotidine  20 mg Oral Daily   . fluticasone furoate-vilanterol  1 puff Inhalation QAM   . folic  acid  1 mg Oral Daily   . gabapentin  300 mg Oral BID   . guaiFENesin  600 mg Oral Q12H SCH   . losartan  100 mg Oral QAM    And   . hydroCHLOROthiazide  25 mg Oral QAM   . insulin glargine  8 Units Subcutaneous QHS   . lactobacillus/streptococcus  1 capsule Oral Daily   . levothyroxine  50 mcg Oral Daily at 0600   . methylPREDNISolone  20 mg Intravenous Q12H   . metoprolol XL  50 mg Oral Daily   . pantoprazole  40 mg Oral Daily   . traZODone  100 mg Oral QHS     Continuous Infusions:   PRN Meds:.acetaminophen **OR** acetaminophen, ALPRAZolam, benzocaine-menthol, Nursing communication: Adult Hypoglycemia Treatment Algorithm **AND** dextrose **AND** dextrose **AND** glucagon (rDNA), HYDROcodone-homatropine, insulin lispro, insulin lispro, lactulose, metoprolol, naloxone, nitroglycerin, ondansetron, polyethylene glycol, traMADol      Labs:     Results     Procedure Component Value Units Date/Time    Glucose Whole Blood - POCT  [[id number]]  (Abnormal) Collected:  03/30/16 0725     POCT - Glucose Whole blood 121 (H) mg/dL Updated:  62/13/08 6578    GFR [[id number]] Collected:  03/30/16 0525     EGFR >60.0 Updated:  03/30/16 0653    Basic Metabolic Panel [[id number]]  (Abnormal) Collected:  03/30/16 0525    Specimen Information:  Blood Updated:  03/30/16 0652     Glucose 161 (H) mg/dL      BUN 27.2 mg/dL      Creatinine 0.8 mg/dL      Calcium 9.0 mg/dL      Sodium 536 mEq/L      Potassium 3.9 mEq/L      Chloride 99 (L) mEq/L      CO2 31 (H) mEq/L      Anion Gap 10.0     Glucose Whole Blood - POCT [[id number]]  (Abnormal) Collected:  03/29/16 2046     POCT - Glucose Whole blood 281 (H) mg/dL Updated:  59/56/38 7564    CULTURE BLOOD AEROBIC AND ANAEROBIC #2 [[id number]] Collected:  03/25/16 1500    Specimen Information:  Arm from Blood Updated:  03/29/16 2021    Narrative:      ORDER#: [id number]                                    ORDERED BY: SHIFFERT, KENT  SOURCE: Blood right ac                               COLLECTED:  03/25/16 15:00  ANTIBIOTICS AT COLL.:                                RECEIVED :  03/25/16 19:40  Culture Blood Aerobic and Anaerobic        PRELIM      03/29/16 20:21  03/26/16   No Growth after 1 day/s of incubation.  03/27/16   No Growth after 2 day/s of incubation.  03/28/16   No Growth after 3 day/s of incubation.  03/29/16   No Growth after 4 day/s of incubation.      CULTURE BLOOD AEROBIC AND ANAEROBIC #1 [[id number]] Collected:  03/25/16 1252    Specimen Information:  Arm from  Blood Updated:  03/29/16 1821    Narrative:      ORDER#: [id number]                                    ORDERED BY: SHIFFERT, KENT  SOURCE: Blood LEFT AC                                COLLECTED:  03/25/16 12:52  ANTIBIOTICS AT COLL.:                                RECEIVED :  03/25/16 17:53  Culture Blood Aerobic and Anaerobic        PRELIM      03/29/16 18:21  03/26/16   No Growth after 1 day/s of incubation.  03/27/16   No Growth after 2 day/s of  incubation.  03/28/16   No Growth after 3 day/s of incubation.  03/29/16   No Growth after 4 day/s of incubation.      Glucose Whole Blood - POCT [[id number]]  (Abnormal) Collected:  03/29/16 1605     POCT - Glucose Whole blood 282 (H) mg/dL Updated:  78/29/56 2130    Glucose Whole Blood - POCT [[id number]]  (Abnormal) Collected:  03/29/16 1155     POCT - Glucose Whole blood 287 (H) mg/dL Updated:  29/52/84 1324            Radiology:     Radiology Results (24 Hour)     ** No results found for the last 24 hours. **            Signed by: Tyna Jaksch, MD  Date/Time: 03/30/2016 8:05 AM

## 2016-03-30 NOTE — Plan of Care (Signed)
Problem: Safety  Goal: Patient will be free from injury during hospitalization  Outcome: Progressing  .  Intervention: Provide and maintain safe environment  .  Intervention: Use appropriate transfer methods  .  Intervention: Ensure appropriate safety devices are available at the bedside  .  Intervention: Include patient/family/caregiver in decisions related to safety  .  Intervention: Hourly rounding.  .  Intervention: Assess for patient's risk for elopement and implement Elopement Risk plan per policy  Falls precautions in place. Bed alarm on and in low position. Call bell and personal belongings within reach. Pt instructed to call for assisitance. Will continue with hourly rounds.  Pt standby assist. Can walk to bathroom. Only ises walker for longer distances. Uses cane or nothing in the home.

## 2016-03-30 NOTE — Progress Notes (Signed)
PULSE OXIMETRY TESTING: (For Home Oxygen)    Document patient's oxygen at rest on room air:     _____% on room air, at rest (NO Ranges please)    _____% on Oxygen, at rest at ____LPM via Nasal Canula      IF patient's saturation is 88% OR BELOW on room air, STOP Here,  IF NOT ambulate patient on room air with exertion and document below:    _____% on Room Air, with exertion (must be 88% or below)    _____% on Oxygen, with exertion, at ____LPM via NC    All three(3) tests must be during the same session.    RN:  Please copy and paste above template and document O2 saturations in a PROGRESS NOTE.    Testing to qualify for home Oxygen must be no earlier than 48 hours prior to discharge, or it will need to be repeated.    Please call the home health care liaison at [phone] when completed.  Thanks.

## 2016-03-30 NOTE — Progress Notes (Signed)
Pt oxygen saturation is 91% at rest with 1.5 l n/c oxygen. Pt oxygen saturation without oxygen is 85% at rest. Walking with oxygen 80%. Md aware.

## 2016-03-31 DIAGNOSIS — A419 Sepsis, unspecified organism: Secondary | ICD-10-CM | POA: Insufficient documentation

## 2016-03-31 LAB — BASIC METABOLIC PANEL
Anion Gap: 10 (ref 5.0–15.0)
BUN: 15.2 mg/dL (ref 7.0–19.0)
CO2: 33 mEq/L — ABNORMAL HIGH (ref 22–29)
Calcium: 9.4 mg/dL (ref 7.9–10.2)
Chloride: 98 mEq/L — ABNORMAL LOW (ref 100–111)
Creatinine: 0.8 mg/dL (ref 0.6–1.0)
Glucose: 170 mg/dL — ABNORMAL HIGH (ref 70–100)
Potassium: 3.9 mEq/L (ref 3.5–5.1)
Sodium: 141 mEq/L (ref 136–145)

## 2016-03-31 LAB — GLUCOSE WHOLE BLOOD - POCT
Whole Blood Glucose POCT: 174 mg/dL — ABNORMAL HIGH (ref 70–100)
Whole Blood Glucose POCT: 340 mg/dL — ABNORMAL HIGH (ref 70–100)
Whole Blood Glucose POCT: 181 mg/dL — ABNORMAL HIGH (ref 70–100)
Whole Blood Glucose POCT: 255 mg/dL — ABNORMAL HIGH (ref 70–100)

## 2016-03-31 LAB — GFR: EGFR: >60

## 2016-03-31 MED ORDER — METFORMIN HCL 500 MG PO TABS
750.0000 mg | ORAL_TABLET | Freq: Every morning | ORAL | Status: DC
Start: 2016-03-31 — End: 2016-03-31

## 2016-03-31 MED ORDER — METOPROLOL SUCCINATE ER 50 MG PO TB24
50.0000 mg | ORAL_TABLET | Freq: Two times a day (BID) | ORAL | Status: DC
Start: 2016-03-31 — End: 2016-04-01
  Administered 2016-03-31 – 2016-04-01 (×2): 50 mg via ORAL
  Filled 2016-03-31 (×2): qty 1

## 2016-03-31 MED ORDER — METFORMIN HCL ER 750 MG PO TB24
750.0000 mg | ORAL_TABLET | Freq: Every day | ORAL | Status: DC
Start: 2016-03-31 — End: 2016-04-01
  Administered 2016-03-31: 750 mg via ORAL

## 2016-03-31 MED ORDER — METFORMIN HCL 500 MG PO TABS
750.0000 mg | ORAL_TABLET | Freq: Every morning | ORAL | Status: DC
Start: 2016-04-01 — End: 2016-03-31

## 2016-03-31 NOTE — Plan of Care (Signed)
Problem: Safety  Goal: Patient will be free from injury during hospitalization  Outcome: Progressing  Intervention: Assess patient's risk for falls and implement fall prevention plan of care per policy  Assess patient's fall risk at every shift. Update fall risk on whiteboard. Bed on lowest position while patient in bed. Bed alarm on while patient in bed.  Intervention: Provide and maintain safe environment  .  Intervention: Use appropriate transfer methods  Patient encouraged to use call bell to call nurse when she needs to go to the bathroom  Intervention: Ensure appropriate safety devices are available at the bedside  Call bell and bedside table at patient's reach  Intervention: Include patient/family/caregiver in decisions related to safety  .  Intervention: Hourly rounding.  Continue to monitor patient's safety and level of comfort on hourly rounding

## 2016-03-31 NOTE — Progress Notes (Signed)
[facility]  Progress Note  Patient Info:   Date/Time: 03/31/2016 / 2:52 PM   Admit Date:03/25/2016  Patient Name:Rachael [name]   SWF:[id number]   PCP: Jocelyn Lamer, DO  Attending Physician:Chrishana Spargur, Michiel Sites, MD     Subjective:   03/31/2016  states the breathing is much better today.  States she is able to ambulate with no difficulty.  Chief Complaint:  Altered Mental Status and Shortness of Breath  Update of Review of Systems:  Review of Systems   Constitutional: Positive for malaise/fatigue. Negative for fever and chills.   Eyes: Negative for blurred vision.   Respiratory: Positive for cough and shortness of breath. Negative for sputum production.    Cardiovascular: Negative for chest pain, palpitations and orthopnea.   Gastrointestinal: Negative for heartburn, nausea, vomiting, abdominal pain, diarrhea and constipation.   Genitourinary: Negative for dysuria, urgency and frequency.   Musculoskeletal: Negative for myalgias and neck pain.   Neurological: Positive for weakness. Negative for dizziness, sensory change, focal weakness and headaches.     Assessment and Plan:    Acute hypoxic respiratory failure: Secondary to chronic obstructive pulmonary disease.  With underlying history of bronchomalacia.  Culture positive for rhino virus.    History of polymyalgia rheumatica: Patient apparently is on low-dose prednisone at home.    Diabetes mellitus type 2: Hemoglobin A1c sometime in February.  Sugars have been significantly high during this hospital stay, most likely secondary to the steroids.  Patient apparently was managed at home on metformin, which has been on hold.  We will restart the metformin.  Continue the Lantus 8 units.  We will observe the patient overnight for the blood sugar changes.   Hypertension.  Blood pressure seems to be on the higher side. We will increase the dose of the metoprolol to 50 twice a day.   Obstructive sleep apnea: Continue CPAP at  nighttime.   Hypothyroidism: Continue Synthroid.       DVT Prohylaxis:lovenox and SEDs   Code Status: Full Code  Disposition:home  Type of Admission:Inpatient  Anticipated Length of Stay: At least 1 more midnight  Medical Necessity for stay: As above  Hospital Problems:   Principal Problem:    Acute respiratory failure with hypoxia  Active Problems:    COPD exacerbation    Lactic acidosis    Acute bronchitis due to Rhinovirus    Objective:     Filed Vitals:    03/31/16 0600 03/31/16 0716 03/31/16 0900 03/31/16 1000   BP: 169/94  147/81 158/84   Pulse: 90 90 91 97   Temp: 97.4 F (36.3 C)   96.8 F (36 C)   TempSrc: Temporal Artery   Temporal Artery   Resp: 20 20  20    Height:       Weight: 79.5 kg (175 lb 4.3 oz)      SpO2: 94%  94% 96%     Physical Exam:   Physical Exam   Constitutional: She is oriented to person, place, and time and well-developed, well-nourished, and in no distress.   Elderly female in no acute distress, sitting comfortably in the chair having her lunch.   HENT:   Head: Normocephalic and atraumatic.   Eyes: Conjunctivae and EOM are normal. Pupils are equal, round, and reactive to light.   Neck: Normal range of motion. Neck supple.   Cardiovascular: Normal rate and regular rhythm.    Murmur heard.  Pulmonary/Chest: Effort normal and breath sounds normal. No respiratory distress. She has no  wheezes. She has no rales.   Abdominal: Soft. Bowel sounds are normal. She exhibits no distension. There is no tenderness. There is no rebound and no guarding.   Musculoskeletal: Normal range of motion. She exhibits no edema or tenderness.   Neurological: She is alert and oriented to person, place, and time. She has normal reflexes. Gait normal. GCS score is 15.   Skin: Skin is warm and dry.     Results of Labs/imaging   Labs and radiology reports have been reviewed.    Hospitalist   Signed by:   Brynda Rim  03/31/2016 2:52 PM    *This note was generated by the Epic EMR system/ Dragon speech recognition  and may contain inherent errors or omissions not intended by the user. Grammatical errors, random word insertions, deletions, pronoun errors and incomplete sentences are occasional consequences of this technology due to software limitations. Not all errors are caught or corrected. If there are questions or concerns about the content of this note or information contained within the body of this dictation they should be addressed directly with the author for clarification

## 2016-03-31 NOTE — Progress Notes (Signed)
Pulmonary Progress Note                              [facility]"    Date Time: 03/31/2016 8:03 AM  Patient Name: Rachael [name]  Attending Physician: Brynda Rim, MD      Assessment:     Patient Active Problem List   Diagnosis   . COPD exacerbation   . Acute respiratory failure with hypoxia   . Lactic acidosis   . Acute bronchitis due to Rhinovirus     -Chronic obstructive pulmonary disease exacerbation +/- PNA, now improved and remains afebrile.  -Hypoxemia, now improved.  -Polymyalgia rheumatica.  -Diabetes  -Severe OSA on CPAP.      Plan:   -Continue current treatment with tapering IV Solu-Medrol, DuoNeb, Mucomyst,  -Wean O2  -Ambulate as tolerated.  -Continue Breo 100-25 daily  -Continue CPAP at night  -If patient able to ambulate with no significant difficulty, from pulmonary standpoint can be discharged today or tomorrow.  She will benefit from evaluation for home O2 prior to the discharge to keep saturation above 88 percent.  -Follow-up as outpatient in 2 weeks in my office.    Interval History   HPI: Patient reports that is feeling a slightly better, wheezing has improved and denies any chest pain.  She reports ambulated with no significant difficulty.  No event overnight.  Remains afebrile.    Interval History/24 hr. Events: None  Physical Exam:   Vital signs for last 24 hours:  Temp:  [96.2 F (35.7 C)-97.9 F (36.6 C)] 97.4 F (36.3 C)  Heart Rate:  [76-90] 90  Resp Rate:  [16-21] 20  BP: (124-169)/(73-94) 169/94 mmHg    Intake and Output Summary (Last 24 hours) at Date Time    Intake/Output Summary (Last 24 hours) at 03/31/16 0803  Last data filed at 03/30/16 1800   Gross per 24 hour   Intake   1000 ml   Output      0 ml   Net   1000 ml       General: awake, alert, oriented x 3; no acute distress; able to speak in complete sentences without accessory muscle use;   HEENT: pupils equal with EOMI; no thyromegaly, no cervical LN, no thrush.  Cardiovascular:  regular rate and rhythm, no murmurs, rubs or gallops. No P2/S2 present. No R sided heave. No JVD.  Lungs:.  Minimal bilateral expiratory wheezing.  overall diminished breath sounds.  No crackles.  Abdomen: soft, non-tender, non-distended; no palpable masses, no hepatosplenomegaly, normoactive bowel sounds, no rebound or guarding  Extremities: no clubbing, cyanosis, or edema.  Neuro: Normal sensory and motor systems.  Derm: No rashes.  Musculoskeletal: nl ROM, no muscle weakness.      Meds:   Scheduled Meds:  Current Facility-Administered Medications   Medication Dose Route Frequency   . albuterol-ipratropium  3 mL Nebulization Q4H WA   . azithromycin  500 mg Oral Daily   . cefTRIAXone  1 g Intravenous Q24H SCH   . dilTIAZem  240 mg Oral Daily   . docusate sodium  100 mg Oral Q12H   . DULoxetine  60 mg Oral BID   . enoxaparin  40 mg Subcutaneous Daily   . ezetimibe  10 mg Oral Daily   . famotidine  20 mg Oral Daily   . fluticasone furoate-vilanterol  1 puff Inhalation QAM   . folic acid  1  mg Oral Daily   . gabapentin  300 mg Oral BID   . guaiFENesin  600 mg Oral Q12H SCH   . losartan  100 mg Oral QAM    And   . hydroCHLOROthiazide  25 mg Oral QAM   . insulin glargine  8 Units Subcutaneous QHS   . lactobacillus/streptococcus  1 capsule Oral Daily   . levothyroxine  50 mcg Oral Daily at 0600   . methylPREDNISolone  20 mg Intravenous Q12H   . metoprolol XL  50 mg Oral Daily   . pantoprazole  40 mg Oral Daily   . traZODone  100 mg Oral QHS     Continuous Infusions:   PRN Meds:.acetaminophen **OR** acetaminophen, ALPRAZolam, benzocaine-menthol, Nursing communication: Adult Hypoglycemia Treatment Algorithm **AND** dextrose **AND** dextrose **AND** glucagon (rDNA), HYDROcodone-homatropine, insulin lispro, insulin lispro, lactulose, metoprolol, naloxone, nitroglycerin, ondansetron, polyethylene glycol, traMADol      Labs:     Results     Procedure Component Value Units Date/Time    GFR [[id number]] Collected:  03/31/16 0624       EGFR >60.0 Updated:  03/31/16 0713    Basic Metabolic Panel [[id number]]  (Abnormal) Collected:  03/31/16 0624    Specimen Information:  Blood Updated:  03/31/16 0713     Glucose 170 (H) mg/dL      BUN 78.2 mg/dL      Creatinine 0.8 mg/dL      Calcium 9.4 mg/dL      Sodium 956 mEq/L      Potassium 3.9 mEq/L      Chloride 98 (L) mEq/L      CO2 33 (H) mEq/L      Anion Gap 10.0     CULTURE BLOOD AEROBIC AND ANAEROBIC #2 [[id number]] Collected:  03/25/16 1500    Specimen Information:  Arm from Blood Updated:  03/30/16 2221    Narrative:      ORDER#: [id number]                                    ORDERED BY: SHIFFERT, KENT  SOURCE: Blood right ac                               COLLECTED:  03/25/16 15:00  ANTIBIOTICS AT COLL.:                                RECEIVED :  03/25/16 19:40  Culture Blood Aerobic and Anaerobic        FINAL       03/30/16 22:21  03/30/16   No growth after 5 days of incubation.      Glucose Whole Blood - POCT [[id number]]  (Abnormal) Collected:  03/30/16 2048     POCT - Glucose Whole blood 335 (H) mg/dL Updated:  27/25/36 6440    CULTURE BLOOD AEROBIC AND ANAEROBIC #1 [[id number]] Collected:  03/25/16 1252    Specimen Information:  Arm from Blood Updated:  03/30/16 2021    Narrative:      ORDER#: [id number]                                    ORDERED BY: SHIFFERT, KENT  SOURCE: Blood LEFT AC  COLLECTED:  03/25/16 12:52  ANTIBIOTICS AT COLL.:                                RECEIVED :  03/25/16 17:53  Culture Blood Aerobic and Anaerobic        FINAL       03/30/16 20:21  03/30/16   No growth after 5 days of incubation.      Glucose Whole Blood - POCT [[id number]]  (Abnormal) Collected:  03/30/16 1633     POCT - Glucose Whole blood 146 (H) mg/dL Updated:  32/44/01 0272    Glucose Whole Blood - POCT [[id number]]  (Abnormal) Collected:  03/30/16 1132     POCT - Glucose Whole blood 168 (H) mg/dL Updated:  74/25/95 6387            Radiology:     Radiology Results (24 Hour)     **  No results found for the last 24 hours. **            Signed by: Tyna Jaksch, MD  Date/Time: 03/31/2016 8:03 AM

## 2016-03-31 NOTE — Plan of Care (Signed)
Problem: Safety  Goal: Patient will be free from injury during hospitalization  Outcome: Progressing  .  Intervention: Assess patient's risk for falls and implement fall prevention plan of care per policy  .  Intervention: Include patient/family/caregiver in decisions related to safety  .  Intervention: Hourly rounding.  Marland Kitchen

## 2016-03-31 NOTE — Plan of Care (Signed)
Problem: Safety  Goal: Patient will be free from injury during hospitalization  Outcome: Progressing  Intervention: Assess patient's risk for falls and implement fall prevention plan of care per policy  Assess patient's fall risk at every shift. Bed on lowest position while patient in bed. Bed alarm on while patient in bed.  Intervention: Provide and maintain safe environment  .  Intervention: Use appropriate transfer methods  Patient is self care with stand by assist to the bathroom. Patient is feeling strong enough to ambulate outside room with standby assist.  Intervention: Ensure appropriate safety devices are available at the bedside  Call bell and bedside table at patient's reach  Intervention: Include patient/family/caregiver in decisions related to safety  Patient's family informed of discharge plans. Family working together with case management to put in place best post discharge care for patient.  Intervention: Hourly rounding.  Continue to monitor patient's level of comfort ans safety

## 2016-03-31 NOTE — Progress Notes (Signed)
Dr. Larina Bras responded: give Lantus 8 units and patient's home med metformin 750 mg. Do not give humalog prn bedtime.

## 2016-03-31 NOTE — Progress Notes (Signed)
Paged Dr. Larina Bras regarding bg numbers and medication administration

## 2016-03-31 NOTE — Progress Notes (Signed)
PULSE OXIMETRY TESTING: (For Home Oxygen)    Document patient's oxygen at rest on room air:     _____% on room air, at rest (NO Ranges please)    _____% on Oxygen, at rest at ____LPM via Nasal Canula      IF patient's saturation is 88% OR BELOW on room air, STOP Here,  IF NOT ambulate patient on room air with exertion and document below:    _____% on Room Air, with exertion (must be 88% or below)    _____% on Oxygen, with exertion, at ____LPM via NC    All three(3) tests must be during the same session.    RN:  Please copy and paste above template and document O2 saturations in a PROGRESS NOTE.    Testing to qualify for home Oxygen must be no earlier than 48 hours prior to discharge, or it will need to be repeated.    Please call the home health care liaison at [phone] when completed.  Thanks.

## 2016-03-31 NOTE — Plan of Care (Signed)
Problem: Psychosocial and Spiritual Needs  Goal: Demonstrates ability to cope with hospitalization/illness  Outcome: Progressing  Intervention: Encourage verbalization of feelings/concerns/expectations  Nurse providing emotional support to patient. Encourage patient to express questions and concerns  Intervention: Provide quiet environment  .  Intervention: Assist patient to identify own strengths and abilities  .  Intervention: Reinforce positive adaptation of new coping behaviors  .

## 2016-03-31 NOTE — Plan of Care (Signed)
PULSE OXIMETRY TESTING: (For Home Oxygen)    Document patient's oxygen at rest on room air:     _85____% on room air, at rest (NO Ranges please)    ___92__% on Oxygen, at rest at _2___LPM via Nasal Canula      IF patient's saturation is 88% OR BELOW on room air, STOP Here,  IF NOT ambulate patient on room air with exertion and document below:    ___81__% on Room Air, with exertion (must be 88% or below)    __90___% on Oxygen, with exertion, at __2__LPM via NC    All three(3) tests must be during the same session.    RN: Please copy and paste above template and document O2 saturations in a PROGRESS NOTE.    Testing to qualify for home Oxygen must be no earlier than 48 hours prior to discharge, or it will need to be repeated.    Please call the home health care liaison at [phone] when completed. Thanks.

## 2016-04-01 LAB — GLUCOSE WHOLE BLOOD - POCT
Whole Blood Glucose POCT: 137 mg/dL — ABNORMAL HIGH (ref 70–100)
Whole Blood Glucose POCT: 202 mg/dL — ABNORMAL HIGH (ref 70–100)

## 2016-04-01 MED ORDER — GUAIFENESIN ER 600 MG PO TB12
600.0000 mg | ORAL_TABLET | Freq: Two times a day (BID) | ORAL | Status: AC
Start: 2016-04-01 — End: ?

## 2016-04-01 MED ORDER — AZITHROMYCIN 500 MG PO TABS
500.0000 mg | ORAL_TABLET | Freq: Every day | ORAL | Status: DC
Start: 2016-04-01 — End: 2017-01-07

## 2016-04-01 MED ORDER — RISAQUAD PO CAPS
1.0000 | ORAL_CAPSULE | Freq: Every day | ORAL | Status: DC
Start: 2016-04-01 — End: 2017-07-21

## 2016-04-01 MED ORDER — INSULIN GLARGINE 100 UNIT/ML SC SOLN
8.0000 [IU] | Freq: Every day | SUBCUTANEOUS | Status: DC
Start: 2016-04-01 — End: 2017-01-07

## 2016-04-01 MED ORDER — PREDNISONE 5 MG PO TABS
40.0000 mg | ORAL_TABLET | Freq: Every day | ORAL | Status: DC
Start: 2016-04-01 — End: 2017-01-11

## 2016-04-01 MED ORDER — HYDROCODONE-HOMATROPINE 5-1.5 MG/5ML PO SYRP
5.0000 mL | ORAL_SOLUTION | ORAL | Status: DC | PRN
Start: 2016-04-01 — End: 2017-01-11

## 2016-04-01 MED ORDER — POLYETHYLENE GLYCOL 3350 17 G PO PACK
17.0000 g | PACK | Freq: Every day | ORAL | Status: DC | PRN
Start: 2016-04-01 — End: 2017-07-21

## 2016-04-01 MED ORDER — LACTULOSE 10 GM/15ML PO SOLN
10.0000 g | Freq: Every day | ORAL | Status: DC | PRN
Start: 2016-04-01 — End: 2017-02-04

## 2016-04-01 NOTE — Plan of Care (Signed)
Pt AAOx4. Pt c/o discomfort in mid chest d/t coughing but verbalized she is better than yesterday and wants to go home. BS this morning is 137. Pt took shower. Tolerated consistent carb diet. Hourly rounding. Will continue monitor the pt.

## 2016-04-01 NOTE — OT Progress Note (Signed)
[facility]  [address]  [place], [place]. [address]    Department of Rehabilitation Services  [phone]    Occupational Therapy Treatment Note       Patient:  Rachael [name] MRN#:  [id number]  [facility] PROGRESSIVE CARE UNIT M202/M202-A    Time of treatment: Start Time: 0902 Stop Time: 0934   Time Calculation (min): 32 min    OT Visit Number: 2    Precautions and Contraindications:  Falls Risk; Droplet Precautions       Assessment: Patient continues to present with the following deficits:decreased strength;balance deficits;decreased independence with ADLs;decreased endurance/activity tolerance;decreased safety awareness. Patient has met 3/5 goals this date with improvements demonstrated in dynamic standing balance w/ rw use and increased endurance for valued self-care tasks.  Patient would benefit from continued skilled OT treatment to maximize independence and safety with ADL's, functional transfers and mobility.   Prognosis: Good;With family  Progress: Progressing toward goals    Patient Goal  Patient Goal:  (to go home today)      Plan: Continue with Occupational therapy services in acute care to address increasing independence and safety with ADL's.  . Focus next therapy session on HEP for UB strengthening; LB dressing skills with use of A.E.      OT Plan  Risks/Benefits/POC Discussed with Pt/Family: With patient  Treatment Interventions: ADL retraining;Functional transfer training;UE strengthening/ROM;Endurance training;Patient/Family training  Discharge Recommendation: Home with supervision;Home with home health OT  DME Recommended for Discharge: Sock aid;Reacher;Long-handled sponge  OT Frequency Recommended: 1-2x/wk  OT - Next Visit Recommendation: 04/02/16       ADL Goals  Patient will groom self: Goal met  Patient will toilet: Goal met  Mobility and Transfer Goals  Pt will transfer bed to toilet: Goal met                         Based on today's session patient's discharge  recommendation is the following: Discharge Recommendation: Home with supervision;Home with home health OT.    DME Recommended for Discharge: Sock aid;Reacher;Long-handled sponge            Subjective: Patient's medical condition is appropriate for Occupational Therapy intervention at this time.  Patient is agreeable to participation in the therapy session. Nursing clears patient for therapy. Patient stating she anticipates d/c to home later today.  Pain Assessment  Pain Assessment: No/denies pain    Objective:Observation of Patient/Vital Signs:  Patient is in bed with telemetry and peripheral IV, O2 at 1.5 liters/minute via nasal cannula in place.    Cognition/Neuro Status  Arousal/Alertness: Appropriate responses to stimuli  Attention Span: Appears intact  Orientation Level: Oriented X4  Memory: Appears intact  Following Commands: Follows all commands and directions without difficulty  Safety Awareness: minimal verbal instruction  Insights: Fully aware of deficits  Problem Solving: Assistance required to generate solutions;minimal assistance (regarding safe rw use)  Behavior: attentive;cooperative;calm    Functional Mobility  Supine to Sit Transfers: Supervision  Sit to Stand Transfers: Supervision (from bed)  Stand to Sit Transfers: Supervision  Bed to toilet Transfers: Supervision (w/ rw)  Toilet to Arm-chair Transfers: Supervision w/ rw  -verbal/tactile instructions for proper hand placement and pacing with all functional transfers for increased safety.       Self-care and Home Management  Grooming: Stand by Assist;standing at sink;supervision/safety;wash/dry hands;teeth care  Toileting: Supervision;grab bar use;supervison/safety;clothing management up;clothing management down (simulating toileting tasks)  Functional Transfers: Supervision;toilet transfer;supervision/safety  Treatment Activities: Patient instructed in proper pacing with transitional mvmts, encouraging Patient to  wait atleast 60 secs. To notice how she feels (lightheaded or dizzy) before attempting to stand/walk for fall prevention. Patient verbalized understanding of same. Patient needed reminders to carry-over safe hand placement from one transfer to the next. Patient educated on LB A.E. For reacher and sock-aide use with good return demonstration performed by Patient to use reacher to initiate underwear over feet; used sock-aide to donn socks. Patient provided w/ purchasing information on same. Pt. Instructed to perform B/L UE AROM exercises for shoulder, elbow and digit F/E intermittently throughout the day to increase endurance and strength for ADL's with visual demonstration provided to increase clarity. Patient issued written handout with visual aides of the above mentioned exercises to increase carry-over.       Educated the patient to role of occupational therapy, plan of care, goals of therapy and safety with mobility and ADLs, home safety.    Patient left without needs and call bell within reach. RN notified of session outcome.        Tennis Ship. Trixie Deis, MS,OTR/L  Pager # [phone]  [phone]

## 2016-04-01 NOTE — Progress Notes (Signed)
Nutritional Support Services  Nutrition Screening    Merina Behrendt 80 y.o. female                                 MRN: [id number]    Referral Source: Per policy  Reason for Referral: LOS    Orders Placed This Encounter   Procedures   . Diet consistent carbohydrate and cardiac      PO intake: Good    Chart review completed.  Labs and meds noted.  No nutrition diagnosis or intervention at this time.  Please consult dietitian if needed.    Rolin Barry, MPH, RDN  Clinical Nutrition Manager  SL X [phone]

## 2016-04-01 NOTE — Discharge Summary (Signed)
[facility] HOSPITALIST   Harrison Summary   Patient Info:   Date/Time: 04/01/2016 / 10:20 AM   Admit Date:03/25/2016  Patient Name:Rachael [name]   ZOX:[id number]   PCP: Jocelyn Lamer, DO  Attending Physician:Breindel Collier, Michiel Sites, MD     Hospital Course:   Please see H&P for complete details of HPI and ROS. The patient was admitted to [facility] and has been diagnosed with Hospital Problems:  Principal Problem:    Acute respiratory failure with hypoxia  Active Problems:    COPD exacerbation    Lactic acidosis    Acute bronchitis due to Rhinovirus    Sepsis, due to unspecified organism   and has been taken care as mentioned below.  80 year old female with history of bronchomalacia admitted with hypoxic respiratory failure secondary to her significant reactive airway disease and a positive culture for rhinovirus.  Patient at baseline does not require any oxygen.  During that hospital stay, she required continuously at 3 L.  She was noticed to be D satting to 80s with minimal exertion.  Patient's hospital course was remarkable for slow improvement in the physical and respiratory condition.   Acute hypoxic respiratory failure: Secondary to chronic obstructive pulmonary disease exacerbation and with underlying bronchomalacia.  Cultures were positive for rhinovirus.  Patient was admitted, started on Solu-Medrol IV, frequent nebulizers and IV Rocephin and Zithromax.  Patient responded well initially with the above treatment.  Discontinue to be hypoxic with minimal exertion, which is where patient was observed another 48 hours before discharge.   Lactic acidosis: Resolved.   Altered mental status: Most likely secondary to above, resolved by the time of my evaluation.   Chest pain, rule out acute coronary syndrome: She was admitted, ruled out for acute coronary syndrome, seen by cardiology.  She will need a close outpatient follow-up with cardiology for further management.   Polymyalgia rheumatica: She  was asked to continue her on his own dosage after the slow taper of the prednisone.   Diabetes mellitus type 2: Patient during her hospital stay due to have erratic blood sugars secondary to Solu-Medrol.  She was asked to restart herself on the metformin and take the Lantus 8 units in the morning.  If the blood pressure continues to drop, she can always discontinue the Lantus.  All of this information was given to the patient at the time of discharge.   Hypertension: Blood pressure has been stable.  In the hospital stay.  She was continued on Cardizem and Toprol-XL.   Obstructive sleep apnea: She was asked to continue her CPAP at night.    Disposition:home  Condition at Discharge and Prognosis: stable  Admission Date:03/25/2016  Discharge Date: 04/01/2016   Type of Admission:Inpatient  Medical Necessity for stay: as above  Code Status: Full Code  Subjective at the time of discharge:   Patient denies any new complaints.  States she is ambulating around the hallway with no difficulty.  Chief Complaint:  Altered Mental Status and Shortness of Breath    Objective:     Filed Vitals:    03/31/16 2215 04/01/16 0110 04/01/16 0510 04/01/16 0718   BP:  134/80 147/85    Pulse:  88 64 73   Temp:  97 F (36.1 C) 97.2 F (36.2 C)    TempSrc:  Temporal Artery Temporal Artery    Resp:  21 20 20    Height:       Weight:       SpO2: 92% 94% 93%  99%     Physical Exam:   Physical Exam   Constitutional: She is well-developed, well-nourished, and in no distress.   HENT:   Head: Normocephalic and atraumatic.   Eyes: Conjunctivae and EOM are normal. Pupils are equal, round, and reactive to light.   Neck: Normal range of motion. Neck supple.   Pulmonary/Chest: Effort normal and breath sounds normal. No respiratory distress. She has no wheezes. She has no rales.   Abdominal: Soft. Bowel sounds are normal. She exhibits no distension. There is no tenderness. There is no rebound.    patient seen and examined.  Vital signs reviewed.  Clinical  Presentation:   History of Presenting Illness: Please refer to HPI in the Detailed H&P  Discharge Diagnosis and Instructions:   Hospital Problems:Principal Problem:    Acute respiratory failure with hypoxia  Active Problems:    COPD exacerbation    Lactic acidosis    Acute bronchitis due to Rhinovirus    Sepsis, due to unspecified organism    Lists the present on admission hospital problems:Present on Admission:   . COPD exacerbation  . Acute respiratory failure with hypoxia  . Lactic acidosis  . Acute bronchitis due to Rhinovirus  Consultants:[facility] FACE-TO-FACE (FTF) [facility] HOME HEALTH FACE-TO-FACE (FTF) ENCOUNTER  Discharge Medications:   Discharge Medications:      Discharge Medication List      Taking          albuterol (2.5 MG/3ML) 0.083% nebulizer solution   Commonly known as:  PROVENTIL   TAKE (2.5MG ) BY NEBULIZATION EVERY 4 HOURS AS NEEDED FOR WHEEZING OR SHORTNESS OF BREATH (COUGH)       ALPRAZolam 0.5 MG tablet   Commonly known as:  XANAX   TAKE 1 TABLET BY MOUTH EVERY OTHER DAY AS NEEDED       azithromycin 500 MG tablet   Dose:  500 mg   Commonly known as:  ZITHROMAX   Take 1 tablet (500 mg total) by mouth daily.       CALCIUM 1200 PO   Take by mouth.       dilTIAZem 240 MG 24 hr capsule   Dose:  240 mg   Commonly known as:  CARDIZEM CD   Take 240 mg by mouth daily.       DULoxetine 60 MG capsule   Dose:  60 mg   Commonly known as:  CYMBALTA   Take 60 mg by mouth 2 (two) times daily.       folic acid 1 MG tablet   Dose:  1 mg   Commonly known as:  FOLVITE   Take 1 mg by mouth daily.       gabapentin 300 MG capsule   Dose:  300 mg   Commonly known as:  NEURONTIN   Take 300 mg by mouth 2 (two) times daily.       guaiFENesin 600 MG 12 hr tablet   Dose:  600 mg   Commonly known as:  MUCINEX   Take 1 tablet (600 mg total) by mouth every 12 (twelve) hours.       HYDROcodone-homatropine 5-1.5 MG/5ML syrup   Dose:  5 mL   Commonly known as:  HYCODAN   Take 5 mLs by mouth every 4 (four)  hours as needed.       insulin glargine 100 UNIT/ML injection   Dose:  8 Units   Commonly known as:  LANTUS   Inject 8 Units  into the skin daily.   Notes to Patient:  Check sugar before breakfast, if greater than 200 mg/dl take the lantus.        lactobacillus/streptococcus Caps   Dose:  1 capsule   Take 1 capsule by mouth daily.       lactulose 10 GM/15ML solution   Dose:  10 g   Commonly known as:  CHRONULAC   Take 15 mLs (10 g total) by mouth daily as needed (Constipation).       levothyroxine 50 MCG tablet   Dose:  50 mcg   Commonly known as:  SYNTHROID, LEVOTHROID   Take 50 mcg by mouth Once a day at 6:00am.       losartan-hydrochlorothiazide 100-25 MG per tablet   Dose:  1 tablet   Commonly known as:  HYZAAR   Take 1 tablet by mouth every morning.       metFORMIN 500 MG 24 hr tablet   Dose:  500 mg   Commonly known as:  GLUCOPHAGE-XR   Take 500 mg by mouth nightly.       metoprolol XL 50 MG 24 hr tablet   Dose:  50 mg   Commonly known as:  TOPROL-XL   Take 50 mg by mouth daily.       pantoprazole 40 MG tablet   Dose:  40 mg   Commonly known as:  PROTONIX   Take 40 mg by mouth daily.       polyethylene glycol packet   Dose:  17 g   Commonly known as:  MIRALAX   Take 17 g by mouth daily as needed (Constipation).       potassium chloride 10 MEQ tablet   Dose:  10 mEq   Commonly known as:  K-TAB,KLOR-CON   Take 10 mEq by mouth daily.       predniSONE 5 MG tablet   Dose:  40 mg   What changed:    - how much to take  - additional instructions   Commonly known as:  DELTASONE   Take 8 tablets (40 mg total) by mouth daily. -2days, 30mg -4days,20mg -4days,10mg -4days then go back on 5 ng daily.       SYMBICORT 160-4.5 MCG/ACT inhaler   Dose:  2 puff   Generic drug:  budesonide-formoterol   Inhale 2 puffs into the lungs 2 (two) times daily.       traMADol 50 MG tablet   Commonly known as:  ULTRAM   TAKE 1 TABLET BY MOUTH TWICE DAILY AS NEEDED FOR PAIN       traZODone 100 MG tablet   Dose:  100 mg   Commonly known as:   DESYREL   Take 100 mg by mouth nightly.       ZETIA 10 MG tablet   Dose:  10 mg   Generic drug:  ezetimibe   Take 10 mg by mouth daily.           Follow up recommendations:   Follow up:         Follow-up Information     Follow up with [facility] [phone].        Follow up with Jocelyn Lamer, DO In 2 days.    Specialty:  Family Medicine    Contact information:    [address]  [place] [address]  [phone]          Follow up with Tyna Jaksch, MD In 1 week.  Specialties:  Pulmonary Disease, Critical Care Medicine, Sleep Medicine, Internal Medicine    Contact information:    [address]  [place] [address]  [phone]           Results of Labs/imaging:   Labs have been reviewed:   Coagulation Profile:   Recent Labs  Lab 03/25/16  1252   PT 12.5*   PT INR 0.9   PTT 31       CBC review:   Recent Labs  Lab 03/28/16  0825 03/27/16  0608 03/26/16  0108 03/25/16  1252   WBC 15.95* 11.89* 7.94 10.28   HGB 12.8 11.7* 12.0 13.3   HEMATOCRIT 40.1 37.0 37.8 41.8   PLATELETS 319 261 236 247   MCV 91.6 90.2 91.1 91.7   RDW 15 15 15 15    NEUTROPHILS  --   --  84 82   SEGMENTED NEUTROPHILS 93 76  --   --    LYMPHOCYTES AUTOMATED  --   --  8 8   EOSINOPHILS AUTOMATED  --   --  2 2   IMMATURE GRANULOCYTE  --   --  0 0   NEUTROPHILS ABSOLUTE  --   --  6.71 8.41*   ABSOLUTE IMMATURE GRANULOCYTE  --   --  0.03 0.04     Chem Review:  Recent Labs  Lab 03/31/16  0624 03/30/16  0525 03/29/16  0355 03/28/16  0825 03/27/16  0608 03/26/16  0108 03/25/16  1252   SODIUM 141 140 139 136 139 139 139   POTASSIUM 3.9 3.9 3.9 4.3 4.0 3.3* 3.6   CHLORIDE 98* 99* 102 101 104 100 96*   CO2 33* 31* 27 23 25 29  30*   BUN 15.2 13.4 14.5 19.6* 15.5 9.0 11.5   CREATININE 0.8 0.8 0.7 0.8 0.8 0.7 0.9   GLUCOSE 170* 161* 163* 326* 289* 187* 156*   CALCIUM 9.4 9.0 8.8 8.9 9.0 8.8 9.9   MAGNESIUM  --   --   --   --   --  2.0 1.5*   BILIRUBIN, TOTAL  --   --   --   --   --   --  0.4   AST (SGOT)  --   --    --   --   --   --  15   ALT  --   --   --   --   --   --  12   ALKALINE PHOSPHATASE  --   --   --   --   --   --  62     Results     Procedure Component Value Units Date/Time    Glucose Whole Blood - POCT [[id number]]  (Abnormal) Collected:  04/01/16 0739     POCT - Glucose Whole blood 137 (H) mg/dL Updated:  21/30/86 5784    Glucose Whole Blood - POCT [[id number]]  (Abnormal) Collected:  03/31/16 2034     POCT - Glucose Whole blood 255 (H) mg/dL Updated:  13/24/40 1027    Glucose Whole Blood - POCT [[id number]]  (Abnormal) Collected:  03/31/16 1647     POCT - Glucose Whole blood 181 (H) mg/dL Updated:  47/42/59 5638    Glucose Whole Blood - POCT [[id number]]  (Abnormal) Collected:  03/31/16 1131     POCT - Glucose Whole blood 340 (H) mg/dL Updated:  18/84/16 6063        Radiology reports have been  reviewed:  Radiology Results (24 Hour)     ** No results found for the last 24 hours. **        Xr Chest 2 Views    03/26/2016  HISTORY: Pneumonia follow-up. Left basilar atelectasis noted on recent chest exam. COMPARISON: Chest radiograph 03/25/2016 and chest CT/10/2016. TECHNIQUE: AP portable and lateral projections of the chest. FINDINGS: Cardiac size and mediastinal contours are normal. Pulmonary vascularity is normal, and the lungs are clear. There is no pneumothorax or pleural effusion. No acute osseous abnormality is identified.     03/26/2016  1. Clear lungs. 2. Normal heart size. Elizebeth Koller, MD 03/26/2016 9:08 AM     Ct Head Wo Contrast    03/25/2016  This CT study was performed using radiation dose reduction techniques including one or more of the following: automated exposure control, adjustment of the mA and/or kV according to patient size and the use of iterative reconstruction technique. History: Confusion, generalized weakness. Findings: Brain CT without intravenous contrast. No comparison studies. There is mild diffuse parenchymal volume loss. There is hypodensity in the supratentorial white matter  consistent with moderate chronic small vessel ischemic disease. There is no mass, acute intracranial hemorrhage. The ventricular system and cisterns are normally configured. The visualized paranasal sinuses and calvarium are unremarkable.     03/25/2016   Supratentorial leukomalacia likely on the basis of a moderate chronic small vessel ischemia. Terrilee Croak, MD 03/25/2016 1:52 PM     Chest Ap Portable    03/25/2016  HISTORY: Sepsis COMPARISON: 08/02/2015 FINDINGS: Single portable view of the chest was obtained. The cardiomediastinal silhouette appears stable. There is aortic atherosclerotic disease. There is mild left basilar airspace disease. There is no definite pleural effusion or pneumothorax. No acute osseous abnormality is identified.         03/25/2016   Mild left basilar airspace disease favored to represent atelectasis. Superimposed infection cannot be excluded. Marius Ditch, MD 03/25/2016 2:10 PM     Ct Abdomen Pelvis W Iv And Po Contrast    03/10/2016  HISTORY: Abdominal pain TECHNIQUE: CT abdomen and pelvis with nonionic IV and oral contrast. 100 cc Visipaque 320 IV given. The following dose reduction techniques were utilized: automated exposure control and/or adjustment of the mA and/or kV according to patient size, and/or the use iterative reconstruction technique. COMPARISON: None FINDINGS: The gallbladder is surgically absent with no biliary ductal dilation. Fatty change of the pancreas. Normal liver, spleen, and adrenals. Kidneys enhance symmetrically with no hydronephrosis, urolithiasis, or ureteral dilation. No detectable bowel wall thickening or obstruction. The appendix is not seen. No localizing inflammatory process. There is no lymphadenopathy, free fluid, or hernia. Portal veins and SMV are patent. Aortic caliber is normal with atherosclerotic change. The uterus is surgically absent. No pelvic mass. Bladder is unremarkable. The soft tissues are unremarkable. Postsurgical changes  of the lumbar spine. Left basilar scarring.     03/10/2016   No acute or suspicious findings. Bea Laura, MD 03/10/2016 2:37 PM     Pathology:   Specimens     None        Pending Lab Results:   Labs/Images to be followed at your PCP office: Unresulted Labs     None         Hospitalist:   Signed by: Brynda Rim  04/01/2016 10:20 AM  Time spent for discharge: 52 minutes      *This note was generated by the Epic EMR system/ Dragon speech  recognition and may contain inherent errors or omissions not intended by the user. Grammatical errors, random word insertions, deletions, pronoun errors and incomplete sentences are occasional consequences of this technology due to software limitations. Not all errors are caught or corrected. If there are questions or concerns about the content of this note or information contained within the body of this dictation they should be addressed directly with the author for clarification

## 2016-04-01 NOTE — Discharge Instructions (Signed)
Home Health Discharge Information     Your doctor has ordered Skilled Nursing, Physical Therapy and Occupational Therapy in-home service(s) for you while you recuperate at home, to assist you in the transition from hospital to home.    The agency that you or your representative chose to provide the service:  Name of Home Health Agency: [facility] [phone]    The Name of Equipment Company: [employer] [phone] 2L via nasal cannula continuous  Upon discharge hospital RT to provide portable Oxygen tank.  Patient/family to call above Home Oxygen company on discharge from the hospital to set up exact delivery time of the oxygen supplies to home.     The above services were set up by:  Clenton Pare  [facility] Liaison)   Phone      [phone]    Additional comments:   If you have not heard from your home health agency within 24-48 hours after discharge please call your agency to arrange a time for your first visit.  For any scheduling concerns or questions related to home health, such as time or date please contact your home health agency at the number listed above.       Discharge Instructions:COPD  You have been diagnosed with chronic obstructive pulmonary disease (COPD). This is a name given to a group of diseases that limit the flow of air in and out of your lungs. This makes it harder to breathe. With COPD, you are also more likely to get lung infections. COPD includes chronic bronchitis and emphysema. COPD ismost often caused by heavy, long-term cigarette smoking.  Home care  Quitsmoking   If you smoke, quit. It is the best thing you can do for your COPD and your overall health.   Join a stop-smoking program. There are even telephone, text message, and Internet programs to help you quit.   Ask yourhealthcare providerabout medicines or other methods to help you quit.   Ask family members to quit smoking as well.   Don't allow people to smoke in your home, in your car,or when they  are around you.  Protect yourself from infection   Wash your hands often. Do your best tokeep your hands away from your face. Most germs are spread from your hands to your mouth.   Get a flu shot every year. Also ask yourprovider about pneumonia vaccines.   Avoid crowds. It's especially important to do this in the winter when more people have colds and flu.   To stay healthy, get enough sleep, exercise regularly, and eat a balanced diet. You should:   Get about 8 hours of sleep every night.   Try to exercise for at least 30 minutes on most days.   Have healthy foods including fruits and vegetables, 100% whole grains, lean meats and fish, and low-fat dairy products. Try to stay away from foods high in fats and sugar.  Take your medicines  Take your medicines exactly as directed. Don't skip doses.  Manage your stress  Stress can make COPD worse. Use this stress management technique:   Find a quiet place and sit or lie in a comfortable position.   Close your eyes and perform breathing exercises for several minutes. Ask your provider about the best way to breathe.  Pulmonary rehabilitation   Pulmonary rehab can help you feel better. These programs include exercise, breathing techniques, information about COPD, counseling, and help for smokers.   [place] provider or your local hospital about programs in your  area.  When to call your healthcare provider  Call yourprovider immediately if you have any of the following:   Shortness of breath, wheezing, or coughing   Increased mucus   Yellow, green, bloody, or smelly mucus   Fever or chills   Tightness in your chest that does not go away with rest or medicine   An irregular heartbeat or a feeling that your heart is beating very fast   Swollen ankles   Date Last Reviewed: 03/17/2015   2000-2016 [employer] [address] [place], [place] [address]. All rights reserved. This information is not intended as a substitute for professional medical  care. Always follow your healthcare professional's instructions.          Understanding the Cold Virus  Colds are the most common illness that people get. Most adults get 2 or 3 colds per year, and most children get 5 to 7 colds per year. Colds may be caused by over 200 types of viruses. The most common of these are rhinoviruses ("rhino" refers to the nose).  What causes a cold virus?  All colds start with infection by a virus. You can be infected by more than one cold virus at a time. Infection with cold viruses happens when:   You breathe in a virus from the air. This can happen when someone with a cold sneezes or coughs near you.   You touch your eyes, nose, or mouth when your hand has a cold virus on it. This can happen if you touch an object that has the cold virus on it.  What are the symptoms of a cold virus?  Almost all colds involve a stuffy nose. Other common symptoms include:   Runny nose   Sneezing   Sore throat   Headache   Cough  How is a cold treated?  Colds usually last 5 to 10 days. Treatment focuses on relieving symptoms. Treatments may include:   Decongestant medicines. Several types of decongestants are available without prescription. These may help reduce stuffy or runny nose symptoms.   Prescription or over-the-counter nasal sprays. These may help reduce nasal symptoms, including stuffiness.   Prescription or over-the-counter pain medicines. These can help with headaches and sore throat.   Self-care. This includes extra rest, using humidifiers, and drinking more fluids. These help you feel better while you are getting over a cold.  Antibiotics are not helpful for a cold. They do not make a cold shorter or relieve symptoms. Taking antibiotics when you don't need them can make them work less well when you need them for another illness.  Follow all directions for using medicines, especially when giving them to children. Contact your healthcare provider if you have any questions about  using cold medicines safely.  Can a cold be prevented?  You can help reduce the spread of cold viruses. This can help both you and others avoid getting colds. Follow these tips:   Wash your hands well anytime you may have come into contact with cold viruses. Wash your hands for at least 20 seconds. When you can't wash with soap and water, use an alcohol-based hand sanitizer.   Don't touch your nose, eyes, or mouth, especially after touching something that may have a cold virus on it.   Cover your mouth and nose when you cough or sneeze. Throw away tissues after using them.   Disinfect things you touch often, such as phones and keyboards.    Stay home when you  have a cold.  What are the possible complications of a cold virus?  Colds usually go away by themselves. But it's not unusual to get another type of infection while you have a cold. These can include:   Sinus infection   Lung infection, such as bronchitis or pneumonia   Ear infection  If you have asthma or chronic bronchitis, a cold can make your condition worse.    When should I call my healthcare provider?  Call your healthcare provider right away if you have any of these:   Fever of 100.53F (38C) or higher, or as directed   Cough, chest pain, or shortness of breath that gets worse   Symptoms don't get better or get worse after about 10 days   Headache, sleepiness, or confusion that gets worse   Date Last Reviewed: 02/11/2015   2000-2016 [employer]. [address] [place], [place] [address]. All rights reserved. This information is not intended as a substitute for professional medical care. Always follow your healthcare professional's instructions.        What Is Acute Bronchitis?  Acute or short-term bronchitis last for days or weeks. Itoccurs when the bronchial tubes (airways in the lungs) are irritated by a virus, bacteria, or allergen. This causes a cough that produces yellow or greenish mucus.  Inside healthy lungs    Air  travels in and out of the lungs through the airways. The linings of these airways produce sticky mucus. This mucus traps particles that enter the lungs. Tiny structures called cilia then sweep the particles out of the airways.     Healthy airway: Airways are normally open. Air moves in and out easily.      Healthy cilia: Tiny, hairlike cilia sweep mucus and particles up and out of the airways.   Lungs with bronchitis  Bronchitis often occurs with a cold or the flu virus. The airways become inflamed (red and swollen).There isa deep "hacking" cough from the extra mucus. Other symptoms may include:   Wheezing   Chest discomfort   Shortness of breath   Mild fever  A second infection, this time due to bacteria, may then occur. And airways irritated by allergens or smoke are more likely to get infected.       Inflamed airway: Inflammation and extra mucus narrow the airway, causing shortness of breath.      Impaired cilia: Extra mucus impairs cilia, causing congestion and wheezing. Smoking makes the problem worse.   Making a diagnosis  A physical exam, health history, and certain tests help your healthcare provider make the diagnosis.  Healthhistory  Your healthcare providerwillask youabout your symptoms.  The exam  Your provider listens toyour chest for signs of congestion. He or she may also checkyour ears, nose, and throat.  Possible tests   A sputum testfor bacteria. This requires a sample of mucus from the lungs.   A nasal or throat swabfor bacterial infection.   A chest X-rayif your healthcare provider thinks you have pneumonia.   Tests to check for an underlying condition,such as allergies, asthma, or COPD. You may need to see a specialist for more lung function testing.  Treating a cough  The main treatment for bronchitis is easing symptoms. Avoiding smoke, allergens, and other things that trigger coughing can often help. If the infection is bacterial, you may be given antibiotics. During the  illness, it's important to get plenty of sleep. To ease symptoms:   Don't smoke, and avoid secondhand smoke.  Use a humidifier, or breathe in steam from a hot shower. This may help loosen mucus.   Drink a lot of water and juice. They can soothe the throat and may help thin mucus.   Sit up or use extra pillows when in bed to help lessen coughing and congestion.   Ask your providerabout usingcough medicine, pain and fever medicine, or a decongestant.  Antibiotics  Most cases of bronchitis are caused by cold or flu viruses. Antibiotics don't treat viral illness. Taking antibiotics when they are not needed increases your risk of getting an infection later that is antibiotic-resistant.Your provider will prescribe antibiotics if the infection is caused by bacteria. If they are prescribed:   Take the medicine until it is used up, even if symptoms have improved. If you don't, the bronchitis may come back.   Take them as directed. For instance, some medicines should be taken with food.   Ask your provider or pharmacist what side effects are common, and what to do about them.  Follow-up care  You shouldsee your provider again in 2 to 3 weeks. By this time, symptoms should have improved. An infection that lasts longer may mean you havea more serious problem.  Prevention   Avoid tobacco smoke. If you smoke, quit. Stay away from smoky places. Ask friends and family not to smoke around you, or in your home or car.   Get checked forallergies.   Ask your provider about getting a yearly flu shot, and pneumococcal or pneumonia shots.   Wash your hands often. This helps reduce the chance of picking up viruses that cause colds and flu.  Call your healthcare provider if:   Symptoms worsen, or new symptoms develop.   Breathing problems worsenor become severe.   Symptoms don't get better within a week, or within 3days of taking antibiotics.   Date Last Reviewed: 05/03/2013   2000-2016 [employer]  [address] [place], [place] [address]. All rights reserved. This information is not intended as a substitute for professional medical care. Always follow your healthcare professional's instructions.

## 2016-04-01 NOTE — Progress Notes (Signed)
Pulmonary Progress Note                              "[facility]"    Date Time: 04/01/2016 8:18 AM  Patient Name: Rachael [name]  Attending Physician: Brynda Rim, MD      Assessment:     Patient Active Problem List   Diagnosis   . COPD exacerbation   . Acute respiratory failure with hypoxia   . Lactic acidosis   . Acute bronchitis due to Rhinovirus   . Sepsis, due to unspecified organism     -Chronic obstructive pulmonary disease exacerbation +/- PNA, patient significantly improved  -Hypoxemia, now improved.  -Polymyalgia rheumatica.  -Diabetes  -Severe OSA on CPAP.      Plan:   -From pulmonary standpoint, patient is clear for discharge on the following regimen:   -Tapering prednisone starting 40 mg daily  -Arrangement for oxygen at home  -Ambulate as tolerated.  -Continue Breo 100-25 daily  -Continue CPAP at night  -Albuterol as needed.  -Follow-up as outpatient in 2 weeks in my office.    Interval History   HPI: She is feeling better, sitting in a chair on 2 L of nasal cannula oxygen.  She reports had a good night and wheezing has improved.  Cough is minimally productive with clear sputum.    Interval History/24 hr. Events: None  Physical Exam:   Vital signs for last 24 hours:  Temp:  [96.8 F (36 C)-97.7 F (36.5 C)] 97.2 F (36.2 C)  Heart Rate:  [64-97] 64  Resp Rate:  [20-21] 20  BP: (134-158)/(76-85) 147/85 mmHg    Intake and Output Summary (Last 24 hours) at Date Time    Intake/Output Summary (Last 24 hours) at 04/01/16 0818  Last data filed at 03/31/16 0900   Gross per 24 hour   Intake    350 ml   Output      0 ml   Net    350 ml       General: awake, alert, oriented x 3; no acute distress; able to speak in complete sentences without accessory muscle use;   HEENT: pupils equal with EOMI; no thyromegaly, no cervical LN, no thrush.  Cardiovascular: regular rate and rhythm, no murmurs, rubs or gallops. No P2/S2 present. No R sided heave. No JVD.  Lungs:.   Improved air entry bilaterally with no current wheezing.  Overall diminished breath sounds.  Abdomen: soft, non-tender, non-distended; no palpable masses, no hepatosplenomegaly, normoactive bowel sounds, no rebound or guarding  Extremities: no clubbing, cyanosis, or edema.  Neuro: Normal sensory and motor systems.  Derm: No rashes.  Musculoskeletal: nl ROM, no muscle weakness.      Meds:   Scheduled Meds:  Current Facility-Administered Medications   Medication Dose Route Frequency   . albuterol-ipratropium  3 mL Nebulization Q4H WA   . azithromycin  500 mg Oral Daily   . cefTRIAXone  1 g Intravenous Q24H SCH   . dilTIAZem  240 mg Oral Daily   . docusate sodium  100 mg Oral Q12H   . DULoxetine  60 mg Oral BID   . enoxaparin  40 mg Subcutaneous Daily   . ezetimibe  10 mg Oral Daily   . famotidine  20 mg Oral Daily   . fluticasone furoate-vilanterol  1 puff Inhalation QAM   . folic acid  1 mg Oral Daily   . gabapentin  300 mg Oral BID   . guaiFENesin  600 mg Oral Q12H SCH   . losartan  100 mg Oral QAM    And   . hydroCHLOROthiazide  25 mg Oral QAM   . insulin glargine  8 Units Subcutaneous QHS   . lactobacillus/streptococcus  1 capsule Oral Daily   . levothyroxine  50 mcg Oral Daily at 0600   . metFORMIN  750 mg Oral Daily   . methylPREDNISolone  20 mg Intravenous Q12H   . metoprolol XL  50 mg Oral BID   . pantoprazole  40 mg Oral Daily   . traZODone  100 mg Oral QHS     Continuous Infusions:   PRN Meds:.acetaminophen **OR** acetaminophen, ALPRAZolam, benzocaine-menthol, Nursing communication: Adult Hypoglycemia Treatment Algorithm **AND** dextrose **AND** dextrose **AND** glucagon (rDNA), HYDROcodone-homatropine, insulin lispro, insulin lispro, lactulose, metoprolol, naloxone, nitroglycerin, ondansetron, polyethylene glycol, traMADol      Labs:     Results     Procedure Component Value Units Date/Time    Glucose Whole Blood - POCT [[id number]]  (Abnormal) Collected:  04/01/16 0739     POCT - Glucose Whole blood 137 (H)  mg/dL Updated:  40/98/11 9147    Glucose Whole Blood - POCT [[id number]]  (Abnormal) Collected:  03/31/16 2034     POCT - Glucose Whole blood 255 (H) mg/dL Updated:  86/57/84 6962    Glucose Whole Blood - POCT [[id number]]  (Abnormal) Collected:  03/31/16 1647     POCT - Glucose Whole blood 181 (H) mg/dL Updated:  40/10/27 2536    Glucose Whole Blood - POCT [[id number]]  (Abnormal) Collected:  03/31/16 1131     POCT - Glucose Whole blood 340 (H) mg/dL Updated:  59/56/38 7564            Radiology:     Radiology Results (24 Hour)     ** No results found for the last 24 hours. **            Signed by: Tyna Jaksch, MD  Date/Time: 04/01/2016 8:18 AM

## 2016-04-10 ENCOUNTER — Other Ambulatory Visit: Payer: Self-pay

## 2016-05-13 ENCOUNTER — Other Ambulatory Visit: Payer: Self-pay

## 2016-06-05 ENCOUNTER — Other Ambulatory Visit: Payer: Self-pay

## 2016-06-10 ENCOUNTER — Other Ambulatory Visit: Payer: Self-pay

## 2017-01-06 ENCOUNTER — Inpatient Hospital Stay: Payer: No Typology Code available for payment source | Admitting: Internal Medicine

## 2017-01-06 ENCOUNTER — Emergency Department: Payer: No Typology Code available for payment source

## 2017-01-06 ENCOUNTER — Inpatient Hospital Stay
Admission: EM | Admit: 2017-01-06 | Discharge: 2017-01-12 | DRG: 872 | Disposition: A | Discharge status: Home Health | Payer: No Typology Code available for payment source | Attending: Internal Medicine | Admitting: Internal Medicine

## 2017-01-06 DIAGNOSIS — D72828 Other elevated white blood cell count: Secondary | ICD-10-CM

## 2017-01-06 DIAGNOSIS — G4733 Obstructive sleep apnea (adult) (pediatric): Secondary | ICD-10-CM

## 2017-01-06 DIAGNOSIS — F419 Anxiety disorder, unspecified: Secondary | ICD-10-CM

## 2017-01-06 DIAGNOSIS — E119 Type 2 diabetes mellitus without complications: Secondary | ICD-10-CM | POA: Diagnosis present

## 2017-01-06 DIAGNOSIS — E872 Acidosis, unspecified: Secondary | ICD-10-CM | POA: Diagnosis present

## 2017-01-06 DIAGNOSIS — E785 Hyperlipidemia, unspecified: Secondary | ICD-10-CM

## 2017-01-06 DIAGNOSIS — K219 Gastro-esophageal reflux disease without esophagitis: Secondary | ICD-10-CM

## 2017-01-06 DIAGNOSIS — R0902 Hypoxemia: Secondary | ICD-10-CM | POA: Diagnosis present

## 2017-01-06 DIAGNOSIS — Z794 Long term (current) use of insulin: Secondary | ICD-10-CM

## 2017-01-06 DIAGNOSIS — M545 Low back pain: Secondary | ICD-10-CM | POA: Diagnosis present

## 2017-01-06 DIAGNOSIS — Z9981 Dependence on supplemental oxygen: Secondary | ICD-10-CM

## 2017-01-06 DIAGNOSIS — I10 Essential (primary) hypertension: Secondary | ICD-10-CM

## 2017-01-06 DIAGNOSIS — Z7984 Long term (current) use of oral hypoglycemic drugs: Secondary | ICD-10-CM

## 2017-01-06 DIAGNOSIS — A419 Sepsis, unspecified organism: Principal | ICD-10-CM

## 2017-01-06 DIAGNOSIS — M353 Polymyalgia rheumatica: Secondary | ICD-10-CM

## 2017-01-06 DIAGNOSIS — E669 Obesity, unspecified: Secondary | ICD-10-CM | POA: Diagnosis present

## 2017-01-06 DIAGNOSIS — J441 Chronic obstructive pulmonary disease with (acute) exacerbation: Secondary | ICD-10-CM

## 2017-01-06 DIAGNOSIS — E871 Hypo-osmolality and hyponatremia: Secondary | ICD-10-CM

## 2017-01-06 DIAGNOSIS — E1165 Type 2 diabetes mellitus with hyperglycemia: Secondary | ICD-10-CM

## 2017-01-06 DIAGNOSIS — G8929 Other chronic pain: Secondary | ICD-10-CM | POA: Diagnosis present

## 2017-01-06 DIAGNOSIS — D72829 Elevated white blood cell count, unspecified: Secondary | ICD-10-CM | POA: Diagnosis present

## 2017-01-06 DIAGNOSIS — R7989 Other specified abnormal findings of blood chemistry: Secondary | ICD-10-CM

## 2017-01-06 DIAGNOSIS — E039 Hypothyroidism, unspecified: Secondary | ICD-10-CM

## 2017-01-06 DIAGNOSIS — Z9119 Patient's noncompliance with other medical treatment and regimen: Secondary | ICD-10-CM

## 2017-01-06 DIAGNOSIS — Z87891 Personal history of nicotine dependence: Secondary | ICD-10-CM

## 2017-01-06 DIAGNOSIS — Z79899 Other long term (current) drug therapy: Secondary | ICD-10-CM

## 2017-01-06 DIAGNOSIS — Z7952 Long term (current) use of systemic steroids: Secondary | ICD-10-CM

## 2017-01-06 LAB — CBC AND DIFFERENTIAL
Absolute NRBC: 0 10*3/uL
Basophils Absolute Automated: 0.02 10*3/uL (ref 0.00–0.20)
Basophils Automated: 0.1 %
Eosinophils Absolute Automated: 0 10*3/uL (ref 0.00–0.70)
Eosinophils Automated: 0 %
Hematocrit: 39.5 % (ref 37.0–47.0)
Hgb: 13 g/dL (ref 12.0–16.0)
Immature Granulocytes Absolute: 0.11 10*3/uL — ABNORMAL HIGH
Immature Granulocytes: 0.8 %
Lymphocytes Absolute Automated: 1.08 10*3/uL (ref 0.50–4.40)
Lymphocytes Automated: 7.7 %
MCH: 27.8 pg — ABNORMAL LOW (ref 28.0–32.0)
MCHC: 32.9 g/dL (ref 32.0–36.0)
MCV: 84.6 fL (ref 80.0–100.0)
MPV: 9.3 fL — ABNORMAL LOW (ref 9.4–12.3)
Monocytes Absolute Automated: 0.61 10*3/uL (ref 0.00–1.20)
Monocytes: 4.4 %
Neutrophils Absolute: 12.17 10*3/uL — ABNORMAL HIGH (ref 1.80–8.10)
Neutrophils: 87 %
Nucleated RBC: 0 /100 WBC (ref 0.0–1.0)
Platelets: 322 10*3/uL (ref 140–400)
RBC: 4.67 10*6/uL (ref 4.20–5.40)
RDW: 15 % (ref 12–15)
WBC: 13.99 10*3/uL — ABNORMAL HIGH (ref 3.50–10.80)

## 2017-01-06 LAB — GFR: EGFR: >60

## 2017-01-06 LAB — COMPREHENSIVE METABOLIC PANEL
ALT: 15 U/L (ref 0–55)
AST (SGOT): 11 U/L (ref 5–34)
Albumin/Globulin Ratio: 1.3 (ref 0.9–2.2)
Albumin: 3.5 g/dL (ref 3.5–5.0)
Alkaline Phosphatase: 62 U/L (ref 37–106)
Anion Gap: 11 (ref 5.0–15.0)
BUN: 15.1 mg/dL (ref 7.0–19.0)
Bilirubin, Total: 0.3 mg/dL (ref 0.2–1.2)
CO2: 24 mEq/L (ref 22–29)
Calcium: 9 mg/dL (ref 7.9–10.2)
Chloride: 97 mEq/L — ABNORMAL LOW (ref 100–111)
Creatinine: 0.9 mg/dL (ref 0.6–1.0)
Globulin: 2.8 g/dL (ref 2.0–3.6)
Glucose: 277 mg/dL — ABNORMAL HIGH (ref 70–100)
Potassium: 4.2 mEq/L (ref 3.5–5.1)
Protein, Total: 6.3 g/dL (ref 6.0–8.3)
Sodium: 132 mEq/L — ABNORMAL LOW (ref 136–145)

## 2017-01-06 LAB — LACTIC ACID, PLASMA: Lactic Acid: 3.5 mmol/L — ABNORMAL HIGH (ref 0.2–2.0)

## 2017-01-06 MED ORDER — IPRATROPIUM BROMIDE 0.02 % IN SOLN
0.5000 mg | Freq: Once | RESPIRATORY_TRACT | Status: AC
Start: 2017-01-06 — End: 2017-01-06
  Administered 2017-01-06: 23:00:00 0.5 mg via RESPIRATORY_TRACT
  Filled 2017-01-06: qty 2.5

## 2017-01-06 MED ORDER — ALBUTEROL SULFATE (2.5 MG/3ML) 0.083% IN NEBU
5.0000 mg | INHALATION_SOLUTION | Freq: Once | RESPIRATORY_TRACT | Status: AC
Start: 2017-01-06 — End: 2017-01-06
  Administered 2017-01-06: 22:00:00 5 mg via RESPIRATORY_TRACT
  Filled 2017-01-06: qty 6

## 2017-01-06 MED ORDER — SODIUM CHLORIDE 0.9 % IV SOLN
INTRAVENOUS | Status: DC
Start: 2017-01-06 — End: 2017-01-07

## 2017-01-06 MED ORDER — IPRATROPIUM BROMIDE 0.02 % IN SOLN
0.5000 mg | Freq: Once | RESPIRATORY_TRACT | Status: AC
Start: 2017-01-06 — End: 2017-01-06
  Administered 2017-01-06: 22:00:00 0.5 mg via RESPIRATORY_TRACT
  Filled 2017-01-06: qty 2.5

## 2017-01-06 MED ORDER — ALBUTEROL SULFATE (2.5 MG/3ML) 0.083% IN NEBU
2.5000 mg | INHALATION_SOLUTION | Freq: Once | RESPIRATORY_TRACT | Status: AC
Start: 2017-01-06 — End: 2017-01-06
  Administered 2017-01-06: 23:00:00 2.5 mg via RESPIRATORY_TRACT
  Filled 2017-01-06: qty 3

## 2017-01-06 MED ORDER — SODIUM CHLORIDE 0.9 % IV BOLUS
30.0000 mL/kg | Freq: Once | INTRAVENOUS | Status: AC
Start: 2017-01-06 — End: 2017-01-06
  Administered 2017-01-06: 2394 mL via INTRAVENOUS

## 2017-01-06 MED ORDER — METHYLPREDNISOLONE SODIUM SUCC 125 MG IJ SOLR
125.0000 mg | Freq: Once | INTRAMUSCULAR | Status: AC
Start: 2017-01-06 — End: 2017-01-06
  Administered 2017-01-06: 22:00:00 125 mg via INTRAVENOUS
  Filled 2017-01-06: qty 2

## 2017-01-06 MED ORDER — SODIUM CHLORIDE 0.9 % IV MBP
1.0000 g | Freq: Once | INTRAVENOUS | Status: AC
Start: 2017-01-06 — End: 2017-01-07
  Administered 2017-01-06: 23:00:00 1 g via INTRAVENOUS
  Filled 2017-01-06: qty 1000
  Filled 2017-01-06: qty 100

## 2017-01-06 NOTE — H&P (Signed)
[facility] HOSPITALIST  H&P  Patient Info:   Date/Time: 01/06/2017 / 11:59 PM   Admit Date:01/06/2017  Patient Name:Rachael [name]   VHQ:[id number]   PCP: Jocelyn Lamer, DO  Attending Physician: Dorthula Nettles, MD     Assessment and Plan:   1.  Chronic obstructive pulmonary disease exacerbation with sepsis, lactic acidosis, hypoxemia, and leukocytosis: Continue IV antibiotics with Rocephin and azithromycin, IV Solu Medrol, DuoNeb treatment, oxygen support, continue Breo Ellipta, repeat labs in a.m.  2.  Diabetes mellitus, type II: Continue Lantus and insulin sliding scale with Humalog  3.  Hyponatremia: Continue IV fluid hydration, repeat labs in a.m.  4.  Hypertension: Continue Cozaar and metoprolol  5.  Hyperlipidemia: Currently not on any statin  6.  Hypothyroid: Continue Synthroid  7.  Gastroesophageal reflux disease: Continue PPI  8.  Anxiety: Continue Cymbalta  9.  Polymyalgia rheumatica: Management per patient's rheumatologist  10.  Obstructive sleep apnea: CPAP if needed      DVT Prohylaxis:SEDs   Code Status: Prior  Disposition:home  Type of Admission: Inpatient  Estimated Length of Stay (including stay in the ER receiving treatment): More than 2 days  Milestones required for discharge: Chronic obstructive pulmonary disease exacerbation with sepsis resolved    Hospital Problems:     Patient Active Problem List   Diagnosis   . COPD exacerbation   . Acute respiratory failure with hypoxia   . Lactic acidosis   . Acute bronchitis due to Rhinovirus   . Sepsis, due to unspecified organism   . Hypoxia   . Hyponatremia   . Leukocytosis   . Anxiety   . GERD (gastroesophageal reflux disease)   . HLD (hyperlipidemia)   . HTN (hypertension)   . Hypothyroid   . PMR (polymyalgia rheumatica)   . OSA (obstructive sleep apnea)   . Diabetes mellitus, type II       Clinical Presentation:   History of Presenting Illness:   Rachael [name] is a 81 y.o. female who has history of   Past Surgical  History:   Procedure Laterality Date   . BACK SURGERY  2014   . CARPAL TUNNEL RELEASE Bilateral 1978   . HYSTERECTOMY  1985   . ROBOTIC, CHOLECYSTECTOMY N/A 10/31/2015    Procedure: ROBOT ASSISTED, LAPAROSCOPIC, CHOLECYSTECTOMY, UMBILICAL HERNIA REPAIR;  Surgeon: Alen Blew, MD;  Location: [facility];  Service: General;  Laterality: N/A;    Past Medical History:   Diagnosis Date   . Anxiety    . Arthritis    . 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 Chronic cholecystitis with calculus 10/31/2015   . Chronic obstructive pulmonary disease    . Gastroesophageal reflux disease    . Hiatal hernia    . Hyperlipidemia    . Hypertension    . Hypothyroidism    . Incontinent of urine 2014    -WEARS DIAPERS-WILL BRING HER OWN   . Low back pain    . Lymphedema of right lower extremity    . Polymyalgia rheumatica 09/2014   . Sleep apnea     USES CPAP NIGHTLY   . Type 2 diabetes mellitus, controlled    . Type II or unspecified type diabetes mellitus without mention of complication, not stated as uncontrolled    . Venous insufficiency of right leg 2016    also has lymphedema-KEEPS HER LEGS WRAPPED EXCEPT FOR WHEN SHE BATHES    came with the  chief complaint of shortness of breath since one week ago.  Patient symptoms initially started with coughing, with productive cloudy mucus, congestion, fever and malaise/fatigue.  Patient then started having short of breath with wheezing, she was treated as outpatient with antibiotics and prednisone, which she completed yesterday, however, patient continued to have shortness of breath.  Patient does have a history of chronic obstructive pulmonary disease on chronic home oxygen at 2 L/m.  Patient states that her nephew and daughter were also sick as well.  Patient denies any recent hospitalization, denies any recent travel, or recent surgery. Patient denies any headache, lightheadedness, chills, chest pain, palpitation, nausea, vomiting, abdominal pain,  diarrhea, blood in stool, melena, urinary symptoms, numbness or focal weakness.    Review of Systems:   Review of Systems   Constitutional: Positive for chills, fever and malaise/fatigue. Negative for diaphoresis and weight loss.   HENT: Positive for congestion. Negative for ear pain, hearing loss, nosebleeds, sore throat and tinnitus.    Eyes: Negative for blurred vision, double vision, photophobia and pain.   Respiratory: Positive for cough, sputum production, shortness of breath and wheezing. Negative for hemoptysis and stridor.    Cardiovascular: Negative for chest pain, palpitations, orthopnea, claudication, leg swelling and PND.   Gastrointestinal: Negative for abdominal pain, blood in stool, diarrhea, heartburn, melena, nausea and vomiting.   Genitourinary: Negative for dysuria, flank pain, frequency, hematuria and urgency.   Musculoskeletal: Negative for back pain, falls, joint pain, myalgias and neck pain.   Skin: Negative for itching and rash.   Neurological: Negative for dizziness, tingling, tremors, sensory change, speech change, focal weakness, seizures, loss of consciousness and headaches.   Endo/Heme/Allergies: Negative for polydipsia. Does not bruise/bleed easily.   Psychiatric/Behavioral: Negative for depression, hallucinations, substance abuse and suicidal ideas.     Physical Exam:     Vitals:    01/06/17 2131 01/06/17 2319   BP: 159/74 123/59   Pulse: 87 89   Resp: 20 20   TempSrc: Temporal Artery    SpO2: 93% 95%   Weight: 79.8 kg (176 lb)    Height: 1.473 m (4\' 10" )      Physical Exam   Constitutional: She is oriented to person, place, and time. No distress.   Patient lethargic and fatigue   HENT:   Head: Normocephalic and atraumatic.   Mouth/Throat: Oropharynx is clear and moist. No oropharyngeal exudate.   Eyes: Conjunctivae and EOM are normal. Pupils are equal, round, and reactive to light. No scleral icterus.   Neck: Normal range of motion. Neck supple. No JVD present. No tracheal deviation  present. No thyromegaly present.   Cardiovascular: Regular rhythm, normal heart sounds and intact distal pulses.  Exam reveals no gallop and no friction rub.    No murmur heard.  Patient tachycardic   Pulmonary/Chest: Effort normal. No stridor. No respiratory distress. She has wheezes. She has no rales. She exhibits no tenderness.   Bilateral wheezing   Abdominal: Soft. Bowel sounds are normal. She exhibits no distension and no mass. There is no tenderness. There is no rebound and no guarding.   Musculoskeletal: Normal range of motion. She exhibits no edema or tenderness.   Lymphadenopathy:     She has no cervical adenopathy.   Neurological: She is alert and oriented to person, place, and time. She has normal reflexes. She displays normal reflexes. No cranial nerve deficit. She exhibits normal muscle tone. GCS score is 15.   Skin: Skin is warm. No rash noted. She  is not diaphoretic. No erythema.   Psychiatric: Mood, affect and judgment normal.   Vitals reviewed.    Clinical Information and History:   Chief Complaint:  Chief Complaint   Patient presents with   . Shortness of Breath     Past Medical History:  Past Medical History:   Diagnosis Date   . Anxiety    . Arthritis    . 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 Chronic cholecystitis with calculus 10/31/2015   . Chronic obstructive pulmonary disease    . Gastroesophageal reflux disease    . Hiatal hernia    . Hyperlipidemia    . Hypertension    . Hypothyroidism    . Incontinent of urine 2014    -WEARS DIAPERS-WILL BRING HER OWN   . Low back pain    . Lymphedema of right lower extremity    . Polymyalgia rheumatica 09/2014   . Sleep apnea     USES CPAP NIGHTLY   . Type 2 diabetes mellitus, controlled    . Type II or unspecified type diabetes mellitus without mention of complication, not stated as uncontrolled    . Venous insufficiency of right leg 2016    also has lymphedema-KEEPS HER LEGS WRAPPED EXCEPT FOR WHEN SHE BATHES     Past Surgical  History:  Past Surgical History:   Procedure Laterality Date   . BACK SURGERY  2014   . CARPAL TUNNEL RELEASE Bilateral 1978   . HYSTERECTOMY  1985   . ROBOTIC, CHOLECYSTECTOMY N/A 10/31/2015    Procedure: ROBOT ASSISTED, LAPAROSCOPIC, CHOLECYSTECTOMY, UMBILICAL HERNIA REPAIR;  Surgeon: Alen Blew, MD;  Location: [facility];  Service: General;  Laterality: N/A;     Family History:History reviewed. No pertinent family history.  Social History:  History   Alcohol Use   . 1.8 oz/week   . 3 Glasses of wine per week     Comment: social     History   Drug Use No     History   Smoking Status   . Former Smoker   . Packs/day: 1.00   . Years: 25.00   . Quit date: 11/16/1998   Smokeless Tobacco   . Never Used     Social History     Social History   . Marital status: Widowed     Spouse name: N/A   . Number of children: N/A   . Years of education: N/A     Social History Main Topics   . Smoking status: Former Smoker     Packs/day: 1.00     Years: 25.00     Quit date: 11/16/1998   . Smokeless tobacco: Never Used   . Alcohol use 1.8 oz/week     3 Glasses of wine per week      Comment: social   . Drug use: No   . Sexual activity: Not Asked     Other Topics Concern   . None     Social History Narrative   . None     Allergies:  Allergies   Allergen Reactions   . Tolectin [Tolmetin] Swelling   . Lyrica [Pregabalin]    . Latex Itching     Medications:  (Not in a hospital admission)  Results of Labs/imaging   Labs have been reviewed:   Coagulation Profile:       CBC review:   Recent Labs  Lab 01/06/17  2151   WBC  13.99*   Hgb 13.0   Hematocrit 39.5   Platelets 322   MCV 84.6   RDW 15   Neutrophils 87.0   Lymphocytes Automated 7.7   Eosinophils Automated 0.0   Immature Granulocyte 0.8   Neutrophils Absolute 12.17*   Absolute Immature Granulocyte 0.11*     Chem Review:  Recent Labs  Lab 01/06/17  2151   Sodium 132*   Potassium 4.2   Chloride 97*   CO2 24   BUN 15.1   Creatinine 0.9   Glucose 277*   Calcium 9.0   Bilirubin,  Total 0.3   AST (SGOT) 11   ALT 15   Alkaline Phosphatase 62     Results     Procedure Component Value Units Date/Time    Lactic acid, plasma - sepsis initial specimen followed by a second specimen if initial result is greater than 2.0 mEq/L [[id number]] Collected:  01/06/17 2351    Specimen:  Blood Updated:  01/06/17 2353    Narrative:       Leretha Dykes second specimen if the initial lactate level is less  than 2.0 mEq/L.  No tourniquet;on ice    CULTURE BLOOD AEROBIC AND ANAEROBIC #2 [[id number]] Collected:  01/06/17 2151    Specimen:  Arm from Blood, Venipuncture Updated:  01/06/17 2339    Narrative:       1 BLUE+1 PURPLE    Rapid influenza A/B antigens [[id number]] Collected:  01/06/17 2250    Specimen:  Nasopharyngeal from Nasal Aspirate Updated:  01/06/17 2313    Narrative:       ORDER#: [id number]                                    ORDERED BY: Reine Just  SOURCE: Nasal Aspirate                               COLLECTED:  01/06/17 22:50  ANTIBIOTICS AT COLL.:                                RECEIVED :  01/06/17 22:54  Influenza Rapid Antigen A&B                FINAL       01/06/17 23:13  01/06/17   Negative for Influenza A and B             Reference Range: Negative      CULTURE BLOOD AEROBIC AND ANAEROBIC #1 [[id number]] Collected:  01/06/17 2250    Specimen:  Arm from Blood, Intravenous Line Updated:  01/06/17 2250    Narrative:       1 BLUE+1 PURPLE    Comprehensive metabolic panel [[id number]]  (Abnormal) Collected:  01/06/17 2151    Specimen:  Blood Updated:  01/06/17 2220     Glucose 277 (H) mg/dL      BUN 53.6 mg/dL      Creatinine 0.9 mg/dL      Sodium 644 (L) mEq/L      Potassium 4.2 mEq/L      Chloride 97 (L) mEq/L      CO2 24 mEq/L      Calcium 9.0 mg/dL      Protein, Total 6.3 g/dL      Albumin 3.5 g/dL      AST (  SGOT) 11 U/L      ALT 15 U/L      Alkaline Phosphatase 62 U/L      Bilirubin, Total 0.3 mg/dL      Globulin 2.8 g/dL      Albumin/Globulin Ratio 1.3     Anion Gap 11.0    GFR [[id number]] Collected:   01/06/17 2151     Updated:  01/06/17 2220     EGFR >60.0    Lactic acid, plasma - sepsis initial specimen followed by a second specimen if initial result is greater than 2.0 mEq/L [[id number]]  (Abnormal) Collected:  01/06/17 2151    Specimen:  Blood Updated:  01/06/17 2211     Lactic acid 3.5 (H) mmol/L     Narrative:       Cancel second specimen if the initial lactate level is less  than 2.0 mEq/L.    CBC with differential [[id number]]  (Abnormal) Collected:  01/06/17 2151    Specimen:  Blood from Blood Updated:  01/06/17 2159     WBC 13.99 (H) x10 3/uL      Hgb 13.0 g/dL      Hematocrit 08.6 %      Platelets 322 x10 3/uL      RBC 4.67 x10 6/uL      MCV 84.6 fL      MCH 27.8 (L) pg      MCHC 32.9 g/dL      RDW 15 %      MPV 9.3 (L) fL      Neutrophils 87.0 %      Lymphocytes Automated 7.7 %      Monocytes 4.4 %      Eosinophils Automated 0.0 %      Basophils Automated 0.1 %      Immature Granulocyte 0.8 %      Nucleated RBC 0.0 /100 WBC      Neutrophils Absolute 12.17 (H) x10 3/uL      Abs Lymph Automated 1.08 x10 3/uL      Abs Mono Automated 0.61 x10 3/uL      Abs Eos Automated 0.00 x10 3/uL      Absolute Baso Automated 0.02 x10 3/uL      Absolute Immature Granulocyte 0.11 (H) x10 3/uL      Absolute NRBC 0.00 x10 3/uL         Radiology reports have been reviewed:  Radiology Results (24 Hour)     Procedure Component Value Units Date/Time    Chest AP Portable [[id number]] Collected:  01/06/17 2148    Order Status:  Completed Updated:  01/06/17 2153    Narrative:       Indication: Sepsis.    Procedure: AP and lateral views of the chest.    Findings: Normal cardiac size. Some calcified atherosclerotic plaque is  visible in the aortic arch. There is no pneumothorax, consolidation, or  pleural effusion. Degenerative changes of the spine are seen.      Impression:        No acute abnormal findings. No evidence of pneumonia.    Wynema Birch, MD   01/06/2017 9:49 PM        EKG: EKG reviewed  Last EKG Result     None            Hospitalist   Signed by:   Juanette Urizar  01/06/2017 11:59 PM    *This note was generated by the Epic EMR system/ Dragon speech recognition and may contain inherent  errors or omissions not intended by the user. Grammatical errors, random word insertions, deletions, pronoun errors and incomplete sentences are occasional consequences of this technology due to software limitations. Not all errors are caught or corrected. If there are questions or concerns about the content of this note or information contained within the body of this dictation they should be addressed directly with the author for clarification

## 2017-01-06 NOTE — ED Provider Notes (Signed)
Physician/Midlevel provider first contact with patient: 01/06/17 2114         History     Chief Complaint   Patient presents with   . Shortness of Breath     Patient presents with cough productive of white sputum, body aches, shortness of breath and wheezing, nasal congestion, subjective fevers and chills for the past week.  Symptoms were slow onset, initially mild now moderate, constant, gradually worsening, nothing makes it better, worse with cough, body aches are nonradiating.  The patient is currently on antibiotics-unknown as patient does not remember name, and on tapering dose of steroids.      The history is provided by the patient and a relative. No language interpreter was used.   Shortness of Breath   Associated symptoms: cough and wheezing    Associated symptoms: no abdominal pain, no chest pain, no fever, no headaches, no neck pain, no sore throat and no vomiting             Past Medical History:   Diagnosis Date   . Anxiety    . Arthritis    . 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 Chronic cholecystitis with calculus 10/31/2015   . Chronic obstructive pulmonary disease    . Gastroesophageal reflux disease    . Hiatal hernia    . Hyperlipidemia    . Hypertension    . Hypothyroidism    . Incontinent of urine 2014    -WEARS DIAPERS-WILL BRING HER OWN   . Low back pain    . Lymphedema of right lower extremity    . Polymyalgia rheumatica 09/2014   . Sleep apnea     USES CPAP NIGHTLY   . Type 2 diabetes mellitus, controlled    . Type II or unspecified type diabetes mellitus without mention of complication, not stated as uncontrolled    . Venous insufficiency of right leg 2016    also has lymphedema-KEEPS HER LEGS WRAPPED EXCEPT FOR WHEN SHE BATHES       Past Surgical History:   Procedure Laterality Date   . BACK SURGERY  2014   . CARPAL TUNNEL RELEASE Bilateral 1978   . HYSTERECTOMY  1985   . ROBOTIC, CHOLECYSTECTOMY N/A 10/31/2015    Procedure: ROBOT ASSISTED, LAPAROSCOPIC,  CHOLECYSTECTOMY, UMBILICAL HERNIA REPAIR;  Surgeon: Alen Blew, MD;  Location: [facility];  Service: General;  Laterality: N/A;       History reviewed. No pertinent family history.    Social  Social History   Substance Use Topics   . Smoking status: Former Smoker     Packs/day: 1.00     Years: 25.00     Quit date: 11/16/1998   . Smokeless tobacco: Never Used   . Alcohol use 1.8 oz/week     3 Glasses of wine per week      Comment: social       .     Allergies   Allergen Reactions   . Tolectin [Tolmetin] Swelling   . Lyrica [Pregabalin]    . Latex Itching       Home Medications             albuterol (PROVENTIL) (2.5 MG/3ML) 0.083% nebulizer solution     TAKE (2.5MG ) BY NEBULIZATION EVERY 4 HOURS AS NEEDED FOR WHEEZING OR SHORTNESS OF BREATH (COUGH)     ALPRAZolam (XANAX) 0.5 MG tablet     TAKE 1 TABLET BY MOUTH  EVERY OTHER DAY AS NEEDED     azithromycin (ZITHROMAX) 500 MG tablet     Take 1 tablet (500 mg total) by mouth daily.     Calcium Carbonate-Vit D-Min (CALCIUM 1200 PO)     Take by mouth.     diltiazem (CARDIZEM CD) 240 MG 24 hr capsule     Take 240 mg by mouth daily.     DULoxetine (CYMBALTA) 60 MG capsule     Take 60 mg by mouth 2 (two) times daily.     folic acid (FOLVITE) 1 MG tablet     Take 1 mg by mouth daily.        gabapentin (NEURONTIN) 300 MG capsule     Take 300 mg by mouth 2 (two) times daily.     guaiFENesin (MUCINEX) 600 MG 12 hr tablet     Take 1 tablet (600 mg total) by mouth every 12 (twelve) hours.     HYDROcodone-homatropine (HYCODAN) 5-1.5 MG/5ML syrup     Take 5 mLs by mouth every 4 (four) hours as needed.     insulin glargine (LANTUS) 100 UNIT/ML injection     Inject 8 Units into the skin daily.     lactobacillus/streptococcus (RISAQUAD) Cap     Take 1 capsule by mouth daily.     lactulose (CHRONULAC) 10 GM/15ML solution     Take 15 mLs (10 g total) by mouth daily as needed (Constipation).     levothyroxine (SYNTHROID, LEVOTHROID) 50 MCG tablet     Take 50 mcg by mouth  Once a day at 6:00am.     losartan-hydrochlorothiazide (HYZAAR) 100-25 MG per tablet     Take 1 tablet by mouth every morning.     metFORMIN (GLUCOPHAGE-XR) 500 MG 24 hr tablet     Take 500 mg by mouth nightly.        metoprolol XL (TOPROL-XL) 50 MG 24 hr tablet     Take 50 mg by mouth daily.     pantoprazole (PROTONIX) 40 MG tablet     Take 40 mg by mouth daily.        polyethylene glycol (MIRALAX) packet     Take 17 g by mouth daily as needed (Constipation).     potassium chloride (K-TAB,KLOR-CON) 10 MEQ tablet     Take 10 mEq by mouth daily.        predniSONE (DELTASONE) 5 MG tablet     Take 8 tablets (40 mg total) by mouth daily. -2days, 30mg -4days,20mg -4days,10mg -4days then go back on 5 ng daily.     SYMBICORT 160-4.5 MCG/ACT inhaler     Inhale 2 puffs into the lungs 2 (two) times daily.        traMADol (ULTRAM) 50 MG tablet     TAKE 1 TABLET BY MOUTH TWICE DAILY AS NEEDED FOR PAIN     traZODone (DESYREL) 100 MG tablet     Take 100 mg by mouth nightly.     ZETIA 10 MG tablet     Take 10 mg by mouth daily.              Review of Systems   Constitutional: Negative for chills and fever.   HENT: Positive for congestion and rhinorrhea. Negative for sore throat.    Eyes: Negative for photophobia and discharge.   Respiratory: Positive for cough, shortness of breath and wheezing.    Cardiovascular: Negative for chest pain and palpitations.   Gastrointestinal: Negative for abdominal pain, diarrhea, nausea and vomiting.  Genitourinary: Negative for dysuria, frequency and hematuria.   Musculoskeletal: Positive for myalgias. Negative for back pain and neck pain.   Neurological: Negative for dizziness, syncope, weakness, light-headedness, numbness and headaches.   Psychiatric/Behavioral: Negative for confusion and suicidal ideas.       Physical Exam    BP: 159/74, Heart Rate: 87, Resp Rate: 20, SpO2: 93 %, Weight: 79.8 kg    Physical Exam   Constitutional: She is oriented to person, place, and time. She appears  well-developed and well-nourished.  Non-toxic appearance. No distress.   HENT:   Head: Normocephalic and atraumatic.   Mouth/Throat: Oropharynx is clear and moist. No oropharyngeal exudate.   Eyes: Conjunctivae, EOM and lids are normal. Pupils are equal, round, and reactive to light. Right eye exhibits no discharge and no exudate. Left eye exhibits no discharge and no exudate. Right conjunctiva is not injected. Left conjunctiva is not injected.   Neck: Normal range of motion and full passive range of motion without pain. Neck supple. No JVD present. No tracheal tenderness, no spinous process tenderness and no muscular tenderness present. Carotid bruit is not present. Normal range of motion present.   Cardiovascular: Normal rate, regular rhythm, normal heart sounds, intact distal pulses and normal pulses.    Pulmonary/Chest: Effort normal. No stridor. No respiratory distress. She has no decreased breath sounds. She has wheezes. She has no rales. She exhibits no tenderness.   Abdominal: Soft. Bowel sounds are normal. She exhibits no distension and no mass. There is no tenderness. There is no rebound and no guarding.   Musculoskeletal: Normal range of motion. She exhibits no edema or tenderness.   No calf tenderness, no Homman's sign   Neurological: She is alert and oriented to person, place, and time. She has normal strength and normal reflexes. No cranial nerve deficit or sensory deficit. Coordination normal. GCS eye subscore is 4. GCS verbal subscore is 5. GCS motor subscore is 6.   Reflex Scores:       Patellar reflexes are 2+ on the right side and 2+ on the left side.  Skin: Skin is warm. No rash noted. She is not diaphoretic. No pallor.   Psychiatric: She has a normal mood and affect. Her speech is normal and behavior is normal. Judgment and thought content normal. Cognition and memory are normal.   Nursing note and vitals reviewed.        MDM and ED Course     ED Medication Orders     Start Ordered     Status  Ordering Provider    01/06/17 2136 01/06/17 2135  sodium chloride 0.9 % bolus 2,394 mL  Once     Route: Intravenous  Ordered Dose: 30 mL/kg     Ordered Javious Hallisey, Ronnell Freshwater    01/06/17 2136 01/06/17 2135  0.9%  NaCl infusion  Continuous     Route: Intravenous     Deirdre Pippins    01/06/17 2136 01/06/17 2135  albuterol (PROVENTIL) nebulizer solution 5 mg  RT - Once     Route: Nebulization  Ordered Dose: 5 mg     Ordered Hatsue Sime, Ronnell Freshwater    01/06/17 2136 01/06/17 2135  ipratropium (ATROVENT) 0.02 % nebulizer solution 0.5 mg  RT - Once     Route: Nebulization  Ordered Dose: 0.5 mg     Deirdre Pippins    01/06/17 2136 01/06/17 2135  methylPREDNISolone sodium succinate (Solu-MEDROL) injection 125 mg  Once     Route: Intravenous  Ordered Dose:  125 mg     Ordered Malashia Kamaka             MDM  Number of Diagnoses or Management Options  COPD exacerbation:   Elevated lactic acid level:   Other elevated white blood cell (WBC) count:   Diagnosis management comments: I, Reine Just, have assumed care of [place].  I have completed her evaluation, reviewed all pertinent data, and determined her final disposition.    " *This note was generated by the Epic EMR system/ Dragon speech recognition and may contain inherent errors or omissions not intended by the user. Grammatical errors, random word insertions, deletions, pronoun errors and incomplete sentences are occasional consequences of this technology due to software limitations. Not all errors are caught or corrected. If there are questions or concerns about the content of this note or information contained within the body of this dictation they should be addressed directly with the author for clarification."      Oxygen saturation by pulse oximetry is 91%-94%, Low Normal.  Interventions: Patient Observed, Oxygen Administration and Inhaler.     I reviewed all labs and/or radiology studies.     I reviewed nursing recorded vitals and history including PMSFHX          CRITICAL CARE    Time: 45 total number of minutes spent in direct and indirect care of this critically ill patient excluding procedure time.    cxr shows no acute cardiopulmonary process-  Read by Dr. Jannet Askew.  X-ray reading was used in medical decision-making.    Differential diagnosis: Sepsis, bronchospasm, chronic obstructive pulmonary disease exacerbation, pneumonia, bronchitis, influenza    Labs, medications, chest x-ray, IV fluids, re-eval    WBC 13.99 with a left shift.  Lactic acid initially 2.5, and after hydration and control of her respiratory status.  Lactic acid decreased to 1.8.  Influenza negative.    Will admit patient for further evaluation and treatment.  Patient continues to be tight but improved from before.  Not a candidate yet to go home.  Patient has been decompensating despite antibiotics and steroids and nebulizers at home.    Discussed study results and treatment plan with patient and family    Discussed case with Dr. Lazaro Arms who admitted patient.              ED Course              Procedures    Clinical Impression & Disposition     Clinical Impression  Final diagnoses:   COPD exacerbation   Elevated lactic acid level   Other elevated white blood cell (WBC) count        ED Disposition     ED Disposition Condition Date/Time Comment    Admit  Thu Jan 07, 2017 12:02 AM Admitting Physician: Brunetta Genera CRAIG [[id number]]   Diagnosis: COPD exacerbation [331795]   Estimated Length of Stay: > or = to 2 midnights   Tentative Discharge Plan?: Home or Self Care [1]   Patient Class: Inpatient [101]             Current Discharge Medication List                    Reine Just, MD  01/07/17 2206

## 2017-01-07 ENCOUNTER — Encounter: Payer: Self-pay | Admitting: Internal Medicine

## 2017-01-07 DIAGNOSIS — I10 Essential (primary) hypertension: Secondary | ICD-10-CM | POA: Diagnosis present

## 2017-01-07 DIAGNOSIS — K219 Gastro-esophageal reflux disease without esophagitis: Secondary | ICD-10-CM | POA: Diagnosis present

## 2017-01-07 DIAGNOSIS — E785 Hyperlipidemia, unspecified: Secondary | ICD-10-CM | POA: Diagnosis present

## 2017-01-07 DIAGNOSIS — R0902 Hypoxemia: Secondary | ICD-10-CM | POA: Diagnosis present

## 2017-01-07 DIAGNOSIS — E871 Hypo-osmolality and hyponatremia: Secondary | ICD-10-CM | POA: Diagnosis present

## 2017-01-07 DIAGNOSIS — M353 Polymyalgia rheumatica: Secondary | ICD-10-CM

## 2017-01-07 DIAGNOSIS — G4733 Obstructive sleep apnea (adult) (pediatric): Secondary | ICD-10-CM | POA: Diagnosis present

## 2017-01-07 DIAGNOSIS — E039 Hypothyroidism, unspecified: Secondary | ICD-10-CM | POA: Diagnosis present

## 2017-01-07 DIAGNOSIS — F419 Anxiety disorder, unspecified: Secondary | ICD-10-CM | POA: Diagnosis present

## 2017-01-07 DIAGNOSIS — D72829 Elevated white blood cell count, unspecified: Secondary | ICD-10-CM | POA: Diagnosis present

## 2017-01-07 DIAGNOSIS — E119 Type 2 diabetes mellitus without complications: Secondary | ICD-10-CM | POA: Diagnosis present

## 2017-01-07 HISTORY — DX: Polymyalgia rheumatica: M35.3

## 2017-01-07 LAB — URINALYSIS, REFLEX TO MICROSCOPIC EXAM IF INDICATED
Bilirubin, UA: NEGATIVE
Blood, UA: NEGATIVE
Glucose, UA: >=500 — AB
Ketones UA: NEGATIVE
Leukocyte Esterase, UA: NEGATIVE
Nitrite, UA: NEGATIVE
Protein, UR: NEGATIVE
Specific Gravity UA: 1.013 (ref 1.001–1.035)
Urine pH: 6 (ref 5.0–8.0)
Urobilinogen, UA: NORMAL mg/dL

## 2017-01-07 LAB — CBC AND DIFFERENTIAL
Absolute NRBC: 0 10*3/uL
Basophils Absolute Automated: 0.01 10*3/uL (ref 0.00–0.20)
Basophils Automated: 0.1 %
Eosinophils Absolute Automated: 0 10*3/uL (ref 0.00–0.70)
Eosinophils Automated: 0 %
Hematocrit: 37.9 % (ref 37.0–47.0)
Hgb: 12.2 g/dL (ref 12.0–16.0)
Immature Granulocytes Absolute: 0.11 10*3/uL — ABNORMAL HIGH
Immature Granulocytes: 0.8 %
Lymphocytes Absolute Automated: 0.75 10*3/uL (ref 0.50–4.40)
Lymphocytes Automated: 5.6 %
MCH: 27.8 pg — ABNORMAL LOW (ref 28.0–32.0)
MCHC: 32.2 g/dL (ref 32.0–36.0)
MCV: 86.3 fL (ref 80.0–100.0)
MPV: 9.1 fL — ABNORMAL LOW (ref 9.4–12.3)
Monocytes Absolute Automated: 0.27 10*3/uL (ref 0.00–1.20)
Monocytes: 2 %
Neutrophils Absolute: 12.36 10*3/uL — ABNORMAL HIGH (ref 1.80–8.10)
Neutrophils: 91.5 %
Nucleated RBC: 0 /100 WBC (ref 0.0–1.0)
Platelets: 285 10*3/uL (ref 140–400)
RBC: 4.39 10*6/uL (ref 4.20–5.40)
RDW: 15 % (ref 12–15)
WBC: 13.5 10*3/uL — ABNORMAL HIGH (ref 3.50–10.80)

## 2017-01-07 LAB — MAGNESIUM: Magnesium: 1.8 mg/dL (ref 1.6–2.6)

## 2017-01-07 LAB — GLUCOSE WHOLE BLOOD - POCT
Whole Blood Glucose POCT: 283 mg/dL — ABNORMAL HIGH (ref 70–100)
Whole Blood Glucose POCT: 308 mg/dL — ABNORMAL HIGH (ref 70–100)
Whole Blood Glucose POCT: 317 mg/dL — ABNORMAL HIGH (ref 70–100)
Whole Blood Glucose POCT: 296 mg/dL — ABNORMAL HIGH (ref 70–100)

## 2017-01-07 LAB — LACTIC ACID, PLASMA
Lactic Acid: 1.8 mmol/L (ref 0.2–2.0)
Lactic Acid: 3.6 mmol/L — ABNORMAL HIGH (ref 0.2–2.0)
Lactic Acid: 3.8 mmol/L — ABNORMAL HIGH (ref 0.2–2.0)
Lactic Acid: 3.9 mmol/L — ABNORMAL HIGH (ref 0.2–2.0)
Lactic Acid: 4.7 mmol/L (ref 0.2–2.0)

## 2017-01-07 LAB — COMPREHENSIVE METABOLIC PANEL
ALT: 14 U/L (ref 0–55)
AST (SGOT): 8 U/L (ref 5–34)
Albumin/Globulin Ratio: 1.2 (ref 0.9–2.2)
Albumin: 3.2 g/dL — ABNORMAL LOW (ref 3.5–5.0)
Alkaline Phosphatase: 59 U/L (ref 37–106)
Anion Gap: 13 (ref 5.0–15.0)
BUN: 14.2 mg/dL (ref 7.0–19.0)
Bilirubin, Total: 0.1 mg/dL — ABNORMAL LOW (ref 0.2–1.2)
CO2: 22 mEq/L (ref 22–29)
Calcium: 8.2 mg/dL (ref 7.9–10.2)
Chloride: 102 mEq/L (ref 100–111)
Creatinine: 0.9 mg/dL (ref 0.6–1.0)
Globulin: 2.7 g/dL (ref 2.0–3.6)
Glucose: 324 mg/dL — ABNORMAL HIGH (ref 70–100)
Potassium: 4.1 mEq/L (ref 3.5–5.1)
Protein, Total: 5.9 g/dL — ABNORMAL LOW (ref 6.0–8.3)
Sodium: 137 mEq/L (ref 136–145)

## 2017-01-07 LAB — HEMOGLOBIN A1C
Average Estimated Glucose: 159.9 mg/dL
Hemoglobin A1C: 7.2 % — ABNORMAL HIGH (ref 4.6–5.9)

## 2017-01-07 LAB — GFR: EGFR: >60

## 2017-01-07 MED ORDER — ALBUTEROL-IPRATROPIUM 2.5-0.5 (3) MG/3ML IN SOLN
3.0000 mL | RESPIRATORY_TRACT | Status: DC | PRN
Start: 2017-01-07 — End: 2017-01-12

## 2017-01-07 MED ORDER — TRAMADOL HCL 50 MG PO TABS
50.0000 mg | ORAL_TABLET | Freq: Two times a day (BID) | ORAL | Status: DC | PRN
Start: 2017-01-07 — End: 2017-01-12
  Administered 2017-01-07 – 2017-01-12 (×7): 50 mg via ORAL
  Filled 2017-01-07 (×7): qty 1

## 2017-01-07 MED ORDER — TRAZODONE HCL 100 MG PO TABS
100.0000 mg | ORAL_TABLET | Freq: Every evening | ORAL | Status: DC | PRN
Start: 2017-01-07 — End: 2017-01-12
  Administered 2017-01-09 – 2017-01-10 (×2): 100 mg via ORAL
  Filled 2017-01-07 (×2): qty 1

## 2017-01-07 MED ORDER — INSULIN LISPRO 100 UNIT/ML SC SOLN
1.0000 [IU] | Freq: Every evening | SUBCUTANEOUS | Status: DC | PRN
Start: 2017-01-07 — End: 2017-01-12
  Administered 2017-01-07 – 2017-01-12 (×5): 2 [IU] via SUBCUTANEOUS
  Filled 2017-01-07 (×4): qty 6
  Filled 2017-01-07: qty 15
  Filled 2017-01-07: qty 6

## 2017-01-07 MED ORDER — CEFTRIAXONE SODIUM IN DEXTROSE 20 MG/ML IV SOLN
1.0000 g | INTRAVENOUS | Status: DC
Start: 2017-01-07 — End: 2017-01-12
  Administered 2017-01-07 – 2017-01-11 (×5): 1 g via INTRAVENOUS
  Filled 2017-01-07 (×6): qty 50

## 2017-01-07 MED ORDER — LOSARTAN POTASSIUM 100 MG PO TABS
100.0000 mg | ORAL_TABLET | Freq: Every day | ORAL | Status: DC
Start: 2017-01-07 — End: 2017-01-12
  Administered 2017-01-07 – 2017-01-12 (×6): 100 mg via ORAL
  Filled 2017-01-07 (×6): qty 1

## 2017-01-07 MED ORDER — GUAIFENESIN ER 600 MG PO TB12
600.0000 mg | ORAL_TABLET | Freq: Two times a day (BID) | ORAL | Status: DC
Start: 2017-01-07 — End: 2017-01-12
  Administered 2017-01-07 – 2017-01-12 (×11): 600 mg via ORAL
  Filled 2017-01-07 (×11): qty 1

## 2017-01-07 MED ORDER — FOLIC ACID 1 MG PO TABS
1.0000 mg | ORAL_TABLET | Freq: Every day | ORAL | Status: DC
Start: 2017-01-07 — End: 2017-01-12
  Administered 2017-01-07 – 2017-01-12 (×6): 1 mg via ORAL
  Filled 2017-01-07 (×6): qty 1

## 2017-01-07 MED ORDER — METOPROLOL TARTRATE 50 MG PO TABS
25.0000 mg | ORAL_TABLET | Freq: Two times a day (BID) | ORAL | Status: DC
Start: 2017-01-07 — End: 2017-01-12
  Administered 2017-01-07 – 2017-01-12 (×11): 25 mg via ORAL
  Filled 2017-01-07 (×11): qty 1

## 2017-01-07 MED ORDER — DULOXETINE HCL 60 MG PO CPEP
60.0000 mg | ORAL_CAPSULE | Freq: Two times a day (BID) | ORAL | Status: DC
Start: 2017-01-07 — End: 2017-01-12
  Administered 2017-01-07 – 2017-01-12 (×11): 60 mg via ORAL
  Filled 2017-01-07 (×5): qty 1
  Filled 2017-01-07: qty 2
  Filled 2017-01-07 (×4): qty 1

## 2017-01-07 MED ORDER — LEVOTHYROXINE SODIUM 50 MCG PO TABS
50.0000 ug | ORAL_TABLET | Freq: Every day | ORAL | Status: DC
Start: 2017-01-07 — End: 2017-01-12
  Administered 2017-01-07 – 2017-01-12 (×6): 50 ug via ORAL
  Filled 2017-01-07 (×6): qty 1

## 2017-01-07 MED ORDER — ALPRAZOLAM 0.5 MG PO TABS
0.5000 mg | ORAL_TABLET | Freq: Every day | ORAL | Status: DC | PRN
Start: 2017-01-07 — End: 2017-01-12
  Administered 2017-01-10 – 2017-01-11 (×2): 0.5 mg via ORAL
  Filled 2017-01-07 (×2): qty 1

## 2017-01-07 MED ORDER — ACETAMINOPHEN 325 MG PO TABS
650.0000 mg | ORAL_TABLET | ORAL | Status: DC | PRN
Start: 2017-01-07 — End: 2017-01-12
  Administered 2017-01-07: 650 mg via ORAL
  Filled 2017-01-07: qty 2

## 2017-01-07 MED ORDER — SODIUM CHLORIDE 0.9 % IV SOLN
500.0000 mg | INTRAVENOUS | Status: DC
Start: 2017-01-07 — End: 2017-01-12
  Administered 2017-01-07 – 2017-01-12 (×6): 500 mg via INTRAVENOUS
  Filled 2017-01-07 (×6): qty 500

## 2017-01-07 MED ORDER — GABAPENTIN 300 MG PO CAPS
300.0000 mg | ORAL_CAPSULE | Freq: Two times a day (BID) | ORAL | Status: DC
Start: 2017-01-07 — End: 2017-01-09
  Administered 2017-01-07 – 2017-01-09 (×5): 300 mg via ORAL
  Filled 2017-01-07 (×5): qty 1

## 2017-01-07 MED ORDER — GLUCAGON 1 MG IJ SOLR (WRAP)
1.0000 mg | INTRAMUSCULAR | Status: DC | PRN
Start: 2017-01-07 — End: 2017-01-12

## 2017-01-07 MED ORDER — DILTIAZEM HCL ER COATED BEADS 240 MG PO CP24
240.0000 mg | ORAL_CAPSULE | Freq: Every day | ORAL | Status: DC
Start: 2017-01-07 — End: 2017-01-07
  Administered 2017-01-07: 09:00:00 240 mg via ORAL
  Filled 2017-01-07: qty 1

## 2017-01-07 MED ORDER — POLYETHYLENE GLYCOL 3350 17 G PO PACK
17.0000 g | PACK | Freq: Every day | ORAL | Status: DC
Start: 2017-01-07 — End: 2017-01-12
  Administered 2017-01-07 – 2017-01-12 (×6): 17 g via ORAL
  Filled 2017-01-07 (×6): qty 1

## 2017-01-07 MED ORDER — RISAQUAD PO CAPS
1.0000 | ORAL_CAPSULE | Freq: Every day | ORAL | Status: DC
Start: 2017-01-07 — End: 2017-01-12
  Administered 2017-01-07 – 2017-01-12 (×6): 1 via ORAL
  Filled 2017-01-07 (×6): qty 1

## 2017-01-07 MED ORDER — ONDANSETRON HCL 4 MG/2ML IJ SOLN
4.0000 mg | Freq: Four times a day (QID) | INTRAMUSCULAR | Status: DC | PRN
Start: 2017-01-07 — End: 2017-01-12

## 2017-01-07 MED ORDER — LACTULOSE 10 GM/15ML PO SOLN
10.0000 g | Freq: Every day | ORAL | Status: DC | PRN
Start: 2017-01-07 — End: 2017-01-12

## 2017-01-07 MED ORDER — PANTOPRAZOLE SODIUM 40 MG PO TBEC
40.0000 mg | DELAYED_RELEASE_TABLET | Freq: Every morning | ORAL | Status: DC
Start: 2017-01-07 — End: 2017-01-12
  Administered 2017-01-07 – 2017-01-12 (×6): 40 mg via ORAL
  Filled 2017-01-07 (×6): qty 1

## 2017-01-07 MED ORDER — ACETYLCYSTEINE 20 % IN SOLN
1.0000 mL | Freq: Four times a day (QID) | RESPIRATORY_TRACT | Status: AC
Start: 2017-01-07 — End: 2017-01-09
  Administered 2017-01-08 – 2017-01-09 (×5): 1 mL via RESPIRATORY_TRACT
  Filled 2017-01-07 (×6): qty 4

## 2017-01-07 MED ORDER — INSULIN GLARGINE 100 UNIT/ML SC SOLN
8.0000 [IU] | Freq: Every day | SUBCUTANEOUS | Status: DC
Start: 2017-01-07 — End: 2017-01-07
  Administered 2017-01-07: 10:00:00 8 [IU] via SUBCUTANEOUS
  Filled 2017-01-07: qty 8

## 2017-01-07 MED ORDER — INSULIN GLARGINE 100 UNIT/ML SC SOLN
12.0000 [IU] | Freq: Every day | SUBCUTANEOUS | Status: DC
Start: 2017-01-08 — End: 2017-01-10
  Administered 2017-01-08 – 2017-01-10 (×3): 12 [IU] via SUBCUTANEOUS
  Filled 2017-01-07 (×3): qty 12

## 2017-01-07 MED ORDER — GLUCOSE 40 % PO GEL
15.0000 g | ORAL | Status: DC | PRN
Start: 2017-01-07 — End: 2017-01-12

## 2017-01-07 MED ORDER — INSULIN LISPRO 100 UNIT/ML SC SOLN
1.0000 [IU] | Freq: Three times a day (TID) | SUBCUTANEOUS | Status: DC | PRN
Start: 2017-01-07 — End: 2017-01-12
  Administered 2017-01-07: 08:00:00 3 [IU] via SUBCUTANEOUS
  Administered 2017-01-07 (×2): 4 [IU] via SUBCUTANEOUS
  Administered 2017-01-08 (×2): 3 [IU] via SUBCUTANEOUS
  Administered 2017-01-08: 17:00:00 4 [IU] via SUBCUTANEOUS
  Administered 2017-01-09: 17:00:00 2 [IU] via SUBCUTANEOUS
  Administered 2017-01-09: 12:00:00 3 [IU] via SUBCUTANEOUS
  Administered 2017-01-09: 08:00:00 2 [IU] via SUBCUTANEOUS
  Administered 2017-01-10: 12:00:00 5 [IU] via SUBCUTANEOUS
  Administered 2017-01-10: 08:00:00 4 [IU] via SUBCUTANEOUS
  Administered 2017-01-10: 17:00:00 5 [IU] via SUBCUTANEOUS
  Administered 2017-01-11: 12:00:00 4 [IU] via SUBCUTANEOUS
  Administered 2017-01-11 (×2): 3 [IU] via SUBCUTANEOUS
  Administered 2017-01-12: 08:00:00 2 [IU] via SUBCUTANEOUS
  Administered 2017-01-12: 12:00:00 3 [IU] via SUBCUTANEOUS
  Filled 2017-01-07: qty 6
  Filled 2017-01-07 (×2): qty 12
  Filled 2017-01-07: qty 9
  Filled 2017-01-07 (×2): qty 12
  Filled 2017-01-07 (×4): qty 9
  Filled 2017-01-07: qty 15
  Filled 2017-01-07: qty 6
  Filled 2017-01-07: qty 15
  Filled 2017-01-07: qty 12
  Filled 2017-01-07 (×2): qty 9
  Filled 2017-01-07: qty 6

## 2017-01-07 MED ORDER — METHYLPREDNISOLONE SODIUM SUCC 125 MG IJ SOLR
60.0000 mg | Freq: Three times a day (TID) | INTRAMUSCULAR | Status: DC
Start: 2017-01-07 — End: 2017-01-09
  Administered 2017-01-07 – 2017-01-09 (×8): 60 mg via INTRAVENOUS
  Filled 2017-01-07 (×8): qty 2

## 2017-01-07 MED ORDER — SODIUM CHLORIDE 0.9 % IV SOLN
INTRAVENOUS | Status: AC
Start: 2017-01-07 — End: 2017-01-07

## 2017-01-07 MED ORDER — DEXTROSE 50 % IV SOLN
12.5000 g | INTRAVENOUS | Status: DC | PRN
Start: 2017-01-07 — End: 2017-01-12

## 2017-01-07 MED ORDER — HYDROCODONE-HOMATROPINE 5-1.5 MG/5ML PO SYRP
5.0000 mL | ORAL_SOLUTION | ORAL | Status: DC | PRN
Start: 2017-01-07 — End: 2017-01-12

## 2017-01-07 MED ORDER — POLYETHYLENE GLYCOL 3350 17 G PO PACK
17.0000 g | PACK | Freq: Every day | ORAL | Status: DC | PRN
Start: 2017-01-07 — End: 2017-01-07

## 2017-01-07 MED ORDER — ALBUTEROL-IPRATROPIUM 2.5-0.5 (3) MG/3ML IN SOLN
3.0000 mL | Freq: Four times a day (QID) | RESPIRATORY_TRACT | Status: DC
Start: 2017-01-07 — End: 2017-01-09
  Administered 2017-01-07 – 2017-01-09 (×9): 3 mL via RESPIRATORY_TRACT
  Filled 2017-01-07 (×9): qty 3

## 2017-01-07 MED ORDER — DILTIAZEM HCL ER COATED BEADS 300 MG PO CP24
300.0000 mg | ORAL_CAPSULE | Freq: Every day | ORAL | Status: DC
Start: 2017-01-08 — End: 2017-01-12
  Administered 2017-01-08 – 2017-01-12 (×5): 300 mg via ORAL
  Filled 2017-01-07 (×5): qty 1

## 2017-01-07 MED ORDER — FLUTICASONE FUROATE-VILANTEROL 100-25 MCG/INH IN AEPB
1.0000 | INHALATION_SPRAY | Freq: Every morning | RESPIRATORY_TRACT | Status: DC
Start: 2017-01-07 — End: 2017-01-12
  Administered 2017-01-07 – 2017-01-12 (×5): 1 via RESPIRATORY_TRACT
  Filled 2017-01-07: qty 14

## 2017-01-07 NOTE — ED Notes (Signed)
Patient presents to ED c/o SOB, cough, congestion. Has hx of COPD. At home, patient wears 2L O2 at night as well as cpap for sleep apnea. Will need cpap when admitted. Patient's O2 sat on room air is 86%, placed on 2L NC, now sating 92%. Patient has bilat lung wheezing, breathing treatment started.

## 2017-01-07 NOTE — Plan of Care (Signed)
Problem: Inadequate Gas Exchange  Goal: Adequate oxygenation and improved ventilation  Outcome: Progressing   01/07/17 0257   Goal/Interventions addressed this shift   Adequate oxygenation and improved ventilation Assess lung sounds;Provide mechanical and oxygen support to facilitate gas exchange;Monitor SpO2 and treat as needed;Monitor and treat ETCO2;Position for maximum ventilatory efficiency;Teach/reinforce use of incentive spirometer 10 times per hour while awake, cough and deep breath as needed;Plan activities to conserve energy: plan rest periods;Consult/collaborate with Respiratory Therapy;Increase activity as tolerated/progressive mobility

## 2017-01-07 NOTE — Plan of Care (Signed)
Problem: Compromised Hemodynamic Status  Goal: Vital signs and fluid balance maintained/improved  Outcome: Progressing   01/07/17 1828   Goal/Interventions addressed this shift   Vital signs and fluid balance are maintained/improved Position patient for maximum circulation/cardiac output;Monitor/assess vitals and hemodynamic parameters with position changes;Monitor and compare daily weight;Monitor intake and output. Notify LIP if urine output is less than 30 mL/hour.;Monitor/assess lab values and report abnormal values       Problem: Inadequate Gas Exchange  Goal: Adequate oxygenation and improved ventilation  Outcome: Progressing   01/07/17 0257   Goal/Interventions addressed this shift   Adequate oxygenation and improved ventilation Assess lung sounds;Provide mechanical and oxygen support to facilitate gas exchange;Monitor SpO2 and treat as needed;Monitor and treat ETCO2;Position for maximum ventilatory efficiency;Teach/reinforce use of incentive spirometer 10 times per hour while awake, cough and deep breath as needed;Plan activities to conserve energy: plan rest periods;Consult/collaborate with Respiratory Therapy;Increase activity as tolerated/progressive mobility       Problem: Safety  Goal: Patient will be free from injury during hospitalization  Outcome: Progressing   01/07/17 2007   Goal/Interventions addressed this shift   Patient will be free from injury during hospitalization  Hourly rounding;Ensure appropriate safety devices are available at the bedside       Problem: Pain  Goal: Pain at adequate level as identified by patient  Outcome: Progressing   01/07/17 2007   Goal/Interventions addressed this shift   Pain at adequate level as identified by patient Identify patient comfort function goal;Evaluate if patient comfort function goal is met;Evaluate patient's satisfaction with pain management progress

## 2017-01-07 NOTE — Progress Notes (Signed)
01/07/17 1952   Patient Type   Within 30 Days of Previous Admission? No   Healthcare Decisions   Interviewed: Patient;Family  (dtr)   Orientation/Decision Making Abilities of Patient Alert and Oriented x3, able to make decisions   Advance Directive Patient does not have advance directive   Healthcare Agent Appointed No   Prior to admission   Prior level of function Independent with ADLs;Ambulates independently   Type of Residence Private residence   Home Layout Multi-level   Have running water, electricity, heat, etc? Yes   Living Arrangements Children  (lives with daughter )   How do you get to your MD appointments? dtr drives   How do you get your groceries? dtr drives   Who fixes your meals? dtr   Who does your laundry? dtr   Who picks up your prescriptions? dtr drives   Dressing Independent   Grooming Independent   Feeding Independent   Bathing Independent   Toileting Independent   DME Currently at Home Single point cane;Front wheel walker;Home O2  (none)   Home Care/Community Services Home PT/OT/Speech   Name of Agency [facility]   Prior SNF admission? (Detail) no   Prior Rehab admission? (Detail) no   Adult Protective Services (APS) involved? No   Discharge Planning   Support Systems Spouse/significant other   Patient expects to be discharged to: anticipating home    Anticipated [place] plan discussed with: Same as interviewed   Potential barriers to discharge: Other  (none)   Mode of transportation: Private car (family member)   Consults/Providers   PT Evaluation Needed 1   OT Evalulation Needed 1   SLP Evaluation Needed 2   Correct PCP listed in Epic? Yes

## 2017-01-07 NOTE — Progress Notes (Signed)
INTERNAL MEDICINE  PROGRESS NOTE     Patient: Rachael [name]  Date: 01/07/2017   LOS: 0 Days  Admission Date: 01/06/2017   MRN: [id number]  Attending: Carmelia Bake, MD       SUBJECTIVE     C.C continues to have cough and dyspnea. No fevers. Lactic acid down to 3.6  WBC 13.50  MEDICATIONS     Current Facility-Administered Medications   Medication Dose Route Frequency   . albuterol-ipratropium  3 mL Nebulization Q6H SCH   . azithromycin  500 mg Intravenous Q24H   . cefTRIAXone  1 g Intravenous Q24H   . dilTIAZem  240 mg Oral Daily   . DULoxetine  60 mg Oral BID   . fluticasone furoate-vilanterol  1 puff Inhalation QAM   . folic acid  1 mg Oral Daily   . gabapentin  300 mg Oral BID   . guaiFENesin  600 mg Oral Q12H SCH   . insulin glargine  8 Units Subcutaneous Daily   . lactobacillus/streptococcus  1 capsule Oral Daily   . levothyroxine  50 mcg Oral Daily at 0600   . losartan  100 mg Oral Daily   . methylPREDNISolone  60 mg Intravenous Q8H   . metoprolol tartrate  25 mg Oral BID   . pantoprazole  40 mg Oral QAM AC   . polyethylene glycol  17 g Oral Daily         ROS   Cough and dyspnea  No fever or chills  +weakness and rib pain      PHYSICAL EXAM     Objective:  I/O last 3 completed shifts:  In: 831.25 [P.O.:100; I.V.:481.25; IV Piggyback:250]  Out: 0      Vitals:    01/07/17 0458 01/07/17 0836 01/07/17 0912 01/07/17 1330   BP: 111/66  155/85 (!) 166/92   Pulse: 89 (!) 108 (!) 118 (!) 109   Resp: 20 22 19 20    Temp: 97.2 F (36.2 C)  100 F (37.8 C) 99.7 F (37.6 C)   TempSrc: Temporal Artery      SpO2: 94% 95% 94% 91%   Weight:       Height:           Exam: Patient is awake, alert and oriented x 3, in no acute distress  HEENT: PERRLA, EOMI, Oral cavity well hydrated.  Neck: supple, without JVD  Chest: diminished breath sounds with rhonchi bilaterally. No crackles   CVS: S1, S2 normal, no rubs, no murmurs, no gallops.  Regular rate and rhythm.  Abdomen: soft and non tender with good bowel  sounds.  Musculoskeletal: No pitting edema, has chronic lymphedema no cyanosis/clubbing  NEURO:  No Focal neurological deficits      LABS       Recent Labs  Lab 01/07/17  0511 01/06/17  2151   WBC 13.50* 13.99*   RBC 4.39 4.67   Hgb 12.2 13.0   Hematocrit 37.9 39.5   MCV 86.3 84.6   Platelets 285 322       Recent Labs  Lab 01/07/17  0511 01/06/17  2151   Sodium 137 132*   Potassium 4.1 4.2   Chloride 102 97*   CO2 22 24   BUN 14.2 15.1   Creatinine 0.9 0.9   Glucose 324* 277*   Calcium 8.2 9.0   Magnesium 1.8  --            Invalid input(s): OSMOL    Recent Labs  Lab 01/07/17  0511 01/06/17  2151   ALT 14 15   AST (SGOT) 8 11   Bilirubin, Total 0.1* 0.3   Albumin 3.2* 3.5   Alkaline Phosphatase 59 62             (!) 308  Invalid input(s): BLOODCULTURE      RADIOLOGY     Radiological Procedure reviewed.  Chest AP Portable   Final Result    No acute abnormal findings. No evidence of pneumonia.      Wynema Birch, MD    01/06/2017 9:49 PM            Patient Active Problem List    Diagnosis Date Noted   . Hypoxia 01/07/2017   . Hyponatremia 01/07/2017   . Leukocytosis 01/07/2017   . Anxiety 01/07/2017   . GERD (gastroesophageal reflux disease) 01/07/2017   . HLD (hyperlipidemia) 01/07/2017   . HTN (hypertension) 01/07/2017   . Hypothyroid 01/07/2017   . PMR (polymyalgia rheumatica) 01/07/2017   . OSA (obstructive sleep apnea) 01/07/2017   . Diabetes mellitus, type II 01/07/2017   . Sepsis, due to unspecified organism    . Acute bronchitis due to Rhinovirus 03/29/2016   . COPD exacerbation 03/25/2016   . Acute respiratory failure with hypoxia 03/25/2016   . Lactic acidosis 03/25/2016       Assessment and Plan:    Sepsis with Lactic acidosis. Lactic acid now trending down to 3.6. Continue IV Ceftriaxone and Zithromax and follow cultures   COPD exacerbation. Continue IV Steroids, Duonebs scheduled and IV antibiotics. Pulmonary consulted   OSA. CPAP at HS ordered   Hypertension/ tachycardia uncontrolled. Increase  Diltiazem to 300 mg daily   Type 2 DM on Lantus Insulin and SSI. Increase Lantus to 12 units daily   PMR on Cymbalta 60 mg BID   Nocturnal Hypoxia on nighttime Oxygen   Hyperlipidemia   Chronic back pain on PRN Tramadol    Request PT/OT evaluation      Estimate length of stay 2-3 days   Code status: Full   Medical necessity for stay  Carmelia Bake, MD  01/07/2017 2:27 PM  Time spent for evaluation and management and coordination of care

## 2017-01-07 NOTE — Progress Notes (Signed)
01/07/17 1610   Provider Notification   Reason for Communication Critical lab value   Critical Lab Chemistry  (Lactic Acid: 3.9)   Critical Lab Value Lactic Acid: 3.9   Provider Name Dr. Ricka Burdock   Provider Role Hospitalist   Method of Communication Page;Call   Response See orders     Pt's Lactic acid was 3.5 at ED and 1.8 around 2351. Pt. Received fluids of bolus and antibiotic at ED. Dr. Ricka Burdock order another Lactic acid drawn and drawn with morning labs. And its 3.9 now Dr. Ricka Burdock called and made aware and said redraw lactic acid 2 hour from now. Will put the order and pass it on to day shift to follow up.

## 2017-01-07 NOTE — Plan of Care (Signed)
Ask3Teach3 Program    Education about New Medications and their Side effects    Dear Rachael [name],    Its been a pleasure taking care of you during your hospitalization here at [facility]. We have initiated a new program to educate our patients and/or their family members or designated personnel about the new medications started by your physicians and their indications along with the possible side effects. Multiple studies have shown that patients started on new medications are often unaware of the names of the medication along with the indications and their side effects which leads to decreased compliance with the medications.    During our conversation today on 01/07/2017  9:43 AM I have explained to you the name of the new medication and the indication along with some possible common side effects. Listed below are some of the new medications started during this hospitalization.     Please call the Nurse if you have any side effects while in hospital.     Please call 911 if you have any life threatening symptoms after you are discharged from the hospital.    Please inform your Primary care physician for common side effects which are not life threatening after discharge.    Albuterol/Ipratropium (Duoneb) Asthma, COPD Headache, cough, nervousness, upper airway infection       Medication: fluticasone/vilanterol (Breo)   This Medication is used for:   COPD or Asthma   Decreases airway constriction and inflammation    Common Side Effects are:   Headache   Sore throat    A note from your nurse:  Rinse your mouth with water and spit out after using.  Call your nurse immediately if you notice itching, hives, swelling or trouble breathing       Thank you for your time.    Gregary Signs, [place]  01/07/2017  9:43 AM  [facility]  [address]  [place], [place]  [address]

## 2017-01-07 NOTE — Plan of Care (Signed)
Problem: Compromised Hemodynamic Status  Goal: Vital signs and fluid balance maintained/improved  Outcome: Progressing   01/07/17 1828   Goal/Interventions addressed this shift   Vital signs and fluid balance are maintained/improved Position patient for maximum circulation/cardiac output;Monitor/assess vitals and hemodynamic parameters with position changes;Monitor and compare daily weight;Monitor intake and output. Notify LIP if urine output is less than 30 mL/hour.;Monitor/assess lab values and report abnormal values       Problem: Inadequate Gas Exchange  Goal: Patent Airway maintained  Outcome: Progressing   01/07/17 1828   Goal/Interventions addressed this shift   Patent airway maintained  Position patient for maximum ventilatory efficiency;Provide adequate fluid intake to liquefy secretions;Suction secretions as needed;Reposition patient every 2 hours and as needed unless able to self-reposition;Reinforce use of ordered respiratory interventions (i.e. CPAP, BiPAP, Incentive Spirometer, Acapella, etc.)

## 2017-01-07 NOTE — UM Notes (Signed)
Hello    REF# [id number]  Copy of MD order for IP status and admission clinicals 01/06/17 - 01/07/17  C/b to Wynona Canes [phone]    Thank-you!      COPY of MD ORDER FOR INPATIENT ADMIT :    01/07/17 0002  Admit to Inpatient (Adult Inpatient Admit Panel (LO)) Once    Status:    Question Answer Comment   Admitting Physician STOCKWELL, RICHARD CRAIG    Diagnosis COPD exacerbation    Estimated Length of Stay > or = to 2 midnights    Tentative Discharge Plan? Home or Self Care    Patient Class Inpatient                  81 yo female to ED 01/06/17 2114. Patient presents with cough productive of white sputum, body aches, shortness of breath and wheezing, nasal congestion, subjective fevers and chills for the past week.  Symptoms were slow onset, initially mild now moderate, constant, gradually worsening, nothing makes it better, worse with cough, body aches are nonradiating.    The patient is currently on antibiotics-unknown as patient does not remember name, and on tapering dose of steroids.  Has hx of COPD. At home, patient wears 2L O2 at night as well as cpap for sleep apnea.  Patient's O2 sat on room air is 86%, placed on 2L NC, now sating 92%.   97.7, HR 89, RR 20, BP 159/74, sats 93%    Past Medical History:   Diagnosis Date   . Anxiety    . Arthritis    . 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 Chronic cholecystitis with calculus 10/31/2015   . Chronic obstructive pulmonary disease    . Gastroesophageal reflux disease    . Hiatal hernia    . Hyperlipidemia    . Hypertension    . Hypothyroidism    . Incontinent of urine 2014    -WEARS DIAPERS-WILL BRING HER OWN   . Low back pain    . Lymphedema of right lower extremity    . PMR (polymyalgia rheumatica) 01/07/2017   . Polymyalgia rheumatica 09/2014   . Sleep apnea     USES CPAP NIGHTLY   . Type 2 diabetes mellitus, controlled    . Type II or unspecified type diabetes mellitus without mention of complication, not stated as uncontrolled    . Venous  insufficiency of right leg 2016    also has lymphedema-KEEPS HER LEGS WRAPPED EXCEPT FOR WHEN SHE BATHES     WBC 13.99, Glucose 277, Na 132, Chlor 97, Lactic acid 3.5  Blood cx xs 2 : pending  NEG FLU  CXRY : no acute abn  On exam : bilat wheezing  In ED : IVF bolus , 5mg  ALB neb xs 2, .5mg  Atrovent neb xs 2, 125mg  IV Solu Medrol, 1g IV Rocephin, O2 2L NC  Admit : COPD exac with hypoxemia and tachypnea, SIRS with leukocytosis and lactic acidosis  Inpatient admission order 01/07/17 0002, tele, DuoNeb q6, 500mg  IV Zithromax q24, 1g IV Rocephin q24, po Cardizem, Breo IN, po Folic acid, Neurontin, Mucinex, SC Lantus daily, po Risaquad, Synthyroid, Losartan, 60mg  IV Solu Medrol q8, po Metoprolol, Protonix, Miralax, prn Tylenol, prn Xanax, prn Hycodan, prn Lactulose, prn IV Zofran, prn Ultram, prn Trazodone, diet as tol, .9% NaCl infusion 169ml/h cont, supp O2, vs q4        On tele 01/07/17 :  T to 100, HR  to 108-118, RR to 20-22, BP 155/85, sats 94% O2 2L NC  WBC 13.50, Mg 1.8, Glucose 324, ALB 3.2, Lactic acid 3.9 - 4.7      MD assessment/plan 01/07/17 :  1.  Chronic obstructive pulmonary disease exacerbation with sepsis, lactic acidosis, hypoxemia, and leukocytosis: Continue IV antibiotics with Rocephin and azithromycin, IV Solu Medrol, DuoNeb treatment, oxygen support, continue Breo Ellipta, repeat labs in a.m.  2.  Diabetes mellitus, type II: Continue Lantus and insulin sliding scale with Humalog  3.  Hyponatremia: Continue IV fluid hydration, repeat labs in a.m.  4.  Hypertension: Continue Cozaar and metoprolol  5.  Hyperlipidemia: Currently not on any statin  6.  Hypothyroid: Continue Synthroid  7.  Gastroesophageal reflux disease: Continue PPI  8.  Anxiety: Continue Cymbalta  9.  Polymyalgia rheumatica: Management per patient's rheumatologist  10.  Obstructive sleep apnea: CPAP if needed  DVT Prohylaxis:SEDs

## 2017-01-07 NOTE — Consults (Signed)
PULMONARY CONSULTATION                              "[facility]"    Date Time: 01/07/17 6:15 PM  Patient Name: Rachael [name]  Attending Physician: Carmelia Bake, MD  Reason for Consultation: Chronic obstructive pulmonary disease exacerbation    Assessment:   -Chronic obstructive pulmonary disease exacerbation, failed treatment as outpatient.  -Reports low-grade temperature.  -Clear chest x-ray.  -Severe OSA with CPAP of 9 cm H2O +2 L oxygen per minute noncompliant.  -Suspect tracheobronchomalacia.  -Presumed PMR on chronic steroid therapy.  -Obesity    Patient Active Problem List   Diagnosis   . COPD exacerbation   . Acute respiratory failure with hypoxia   . Lactic acidosis   . Acute bronchitis due to Rhinovirus   . Sepsis, due to unspecified organism   . Hypoxia   . Hyponatremia   . Leukocytosis   . Anxiety   . GERD (gastroesophageal reflux disease)   . HLD (hyperlipidemia)   . HTN (hypertension)   . Hypothyroid   . PMR (polymyalgia rheumatica)   . OSA (obstructive sleep apnea)   . Diabetes mellitus, type II       Plan:   -Continue current treatment with tapering IV steroids, DuoNeb, and Mucomyst,  -Continue azithromycin and ceftriaxone for 5 days.  -Ambulate as tolerated.  -Acapella valve.  -CPAP at 9 cm H2O plus oxygen 2 L/m to keep saturation above 90 percent at nighttime.  -Sputum culture.  -Expect possible discharge in 48-72 hours.    History of Present Illness:   Rachael [name] is a 81 y.o. female who presents to the hospital with More than 1 week history of productive cough with green color sputum, increased wheezing and exertional dyspnea and chest tightness.  Patient reports that starting a week ago received a course of Z-Pak plus prednisone.  She finished her antibiotic and was assumed prednisone, but despite that, due to the worsening of the symptoms, presented to the ED.  She was noted to have no fever, WBC count of 13.9, with left shift, elevated  lactic acid at 3.5.  Patient was admitted diagnosis of sepsis and possible pneumonia, plus chronic obstructive pulmonary disease exacerbation, placed on Rocephin, Zithromax, and IV steroids.  Chest x-ray was unremarkable for any acute event.  Patient has required 2 L of nasal oxygen on and off.    At the time of my evaluation, patient is on room air in no acute distress.  Continues to have dyspnea on exertion and significant wheezing and chest tightness.  Patient is known to me from outpatient office follow-up.  She has history of chronic obstructive pulmonary disease with previous hospitalizations.  She has 20+ pack year smoking history, quit smoking many years ago.  Also has been exposed to wood-burning stove in the past.  Patient uses oxygen at nighttime only.  She also has diagnosis of severe OSA with AHI of 33.  Cardura CPAP of 9 cm H2O with good compliance and also uses 2 L of oxygen at nighttime.  She was last time seen by me on September 2017.  She has questionable history of adrenal insufficiency and polymyalgia rheumatica for which has been on and off prednisone by rheumatology.  Patient claims that has been compliant with the medications.  For Chronic obstructive pulmonary disease she takes Symbicort 160/4.5 one puff twice a day and Spiriva once daily and  albuterol as needed.  She has a chronic lower extremity edema which remains unchanged.  Does not exercise regularly.  Lives alone.  Past Medical History:     Past Medical History:   Diagnosis Date   . Anxiety    . Arthritis    . 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 Chronic cholecystitis with calculus 10/31/2015   . Chronic obstructive pulmonary disease    . Gastroesophageal reflux disease    . Hiatal hernia    . Hyperlipidemia    . Hypertension    . Hypothyroidism    . Incontinent of urine 2014    -WEARS DIAPERS-WILL BRING HER OWN   . Low back pain    . Lymphedema of right lower extremity    . PMR (polymyalgia rheumatica) 01/07/2017      . Polymyalgia rheumatica 09/2014   . Sleep apnea     USES CPAP NIGHTLY   . Type 2 diabetes mellitus, controlled    . Type II or unspecified type diabetes mellitus without mention of complication, not stated as uncontrolled    . Venous insufficiency of right leg 2016    also has lymphedema-KEEPS HER LEGS WRAPPED EXCEPT FOR WHEN SHE BATHES       Past Surgical History:     Past Surgical History:   Procedure Laterality Date   . BACK SURGERY  2014   . CARPAL TUNNEL RELEASE Bilateral 1978   . HYSTERECTOMY  1985   . ROBOTIC, CHOLECYSTECTOMY N/A 10/31/2015    Procedure: ROBOT ASSISTED, LAPAROSCOPIC, CHOLECYSTECTOMY, UMBILICAL HERNIA REPAIR;  Surgeon: Alen Blew, MD;  Location: [facility];  Service: General;  Laterality: N/A;       Family History:   History reviewed. No pertinent family history.    Social History:     Social History     Social History   . Marital status: Widowed     Spouse name: N/A   . Number of children: N/A   . Years of education: N/A     Social History Main Topics   . Smoking status: Former Smoker     Packs/day: 1.00     Years: 25.00     Quit date: 11/16/1998   . Smokeless tobacco: Never Used   . Alcohol use 1.8 oz/week     3 Glasses of wine per week      Comment: social   . Drug use: No   . Sexual activity: Not on file     Other Topics Concern   . Not on file     Social History Narrative   . No narrative on file       Allergies:     Allergies   Allergen Reactions   . Tolectin [Tolmetin] Swelling   . Lyrica [Pregabalin]    . Latex Itching       Medications:     Prescriptions Prior to Admission   Medication Sig   . diltiazem (CARDIZEM CD) 240 MG 24 hr capsule Take 240 mg by mouth daily.   . DULoxetine (CYMBALTA) 60 MG capsule Take 60 mg by mouth 2 (two) times daily.   Marland Kitchen gabapentin (NEURONTIN) 300 MG capsule Take 300 mg by mouth 2 (two) times daily.   Marland Kitchen guaiFENesin (MUCINEX) 600 MG 12 hr tablet Take 1 tablet (600 mg total) by mouth every 12 (twelve) hours. (Patient taking differently:  Take 600 mg by mouth daily.    )   .  hydrALAZINE (APRESOLINE) 25 MG tablet Take 1 tablet by mouth 2 (two) times daily.   Marland Kitchen HYDROcodone-homatropine (HYCODAN) 5-1.5 MG/5ML syrup Take 5 mLs by mouth every 4 (four) hours as needed.   Marland Kitchen ipratropium (ATROVENT) 0.02 % nebulizer solution Take 500 mcg by nebulization 4 (four) times daily.       Marland Kitchen lactulose (CHRONULAC) 10 GM/15ML solution Take 15 mLs (10 g total) by mouth daily as needed (Constipation).   Marland Kitchen levothyroxine (SYNTHROID, LEVOTHROID) 50 MCG tablet Take 50 mcg by mouth Once a day at 6:00am.   . LIRAGLUTIDE SC Inject 1.2 Units into the skin.       . metFORMIN (GLUCOPHAGE-XR) 750 MG 24 hr tablet Take 750 mg by mouth nightly.   . metoprolol succinate XL (TOPROL-XL) 25 MG 24 hr tablet Take 25 mg by mouth 2 (two) times daily.   . pantoprazole (PROTONIX) 40 MG tablet Take 40 mg by mouth daily.      . Pitavastatin Calcium (LIVALO) 2 MG Tab Take 1 tablet by mouth nightly.   . polyethylene glycol (MIRALAX) packet Take 17 g by mouth daily as needed (Constipation).   . potassium chloride (K-TAB,KLOR-CON) 10 MEQ tablet Take 10 mEq by mouth daily.      . predniSONE (DELTASONE) 5 MG tablet Take 8 tablets (40 mg total) by mouth daily. -2days, 30mg -4days,20mg -4days,10mg -4days then go back on 5 ng daily.   . SYMBICORT 160-4.5 MCG/ACT inhaler Inhale 2 puffs into the lungs 2 (two) times daily.      . traMADol (ULTRAM) 50 MG tablet TAKE 1 TABLET BY MOUTH TWICE DAILY AS NEEDED FOR PAIN   . traZODone (DESYREL) 100 MG tablet Take 50 mg by mouth nightly.       . lactobacillus/streptococcus (RISAQUAD) Cap Take 1 capsule by mouth daily.   Marland Kitchen losartan-hydrochlorothiazide (HYZAAR) 100-25 MG per tablet Take 1 tablet by mouth every morning.       Review of Systems:     CONSTITUTIONAL: Appetite is good; Patient reports on and off fever and chills in the past week with maximum temperature of 100.1.  She reports recent loss of 10 pounds in the past 1.5 months after switching diabetic medication.   EYES: No Visual changes; No Eye pain   ENT: No Hearing difficulties; No Ear pain; denies sinus pain or congestion.   CARDIOVASCULAR: No chest pain; No palpitation; chronic lower extremity edema  RESPIRATORY: Positive for productive cough with yellow to green sputum and difficulty expectorating.  Also increased wheezing and dyspnea on exertion but no hemoptysis.  GASTROINTESTINAL: No Abdominal pain; No Nausea/vomiting; No Change in bowel habits   GENITOURINARY: No Dysuria; No Urgency; No Incontinence.  MUSCULOSKELETAL: No Joint pain/swelling; No Musculoskeletal deformities   SKIN: No Rash   NEUROLOGICAL: No Mental status changes; No Motor weakness; No Sensory changes   PSYCHOLOGICAL: No Depression; No Anxiety   ENDOCRINE: No Polyuria/polydipsia; No Heat/Cold intolerance   HEMATOLOGY/LYMPHATIC: No Easy bleeding/bruising; No Swollen nodes   ALLERGY/IMMUNOLOGY: No Allergic reaction      Physical Exam:     Vitals:    01/07/17 1809   BP: 170/85   Pulse: (!) 105   Resp: 22   Temp: 100 F (37.8 C)   SpO2: (!) 89%     Body mass index is 36.85 kg/m.    GENERAL: No acute distress.  HEENT: Neck supple, oropharynx clear, No lymphadenopathy.   LUNGS: Bilateral diffuse moderate expiratory wheezing and minimal expiratory crackles in the bases.  Air entry is symmetrical.  CARDIOVASCULAR:  Regular rate without audible murmurs.   ABDOMEN: Bowel sounds present. Soft, non-tender, no organomegaly noted.   EXTREMITIES: 2+ bilateral lower extremity edema, no cyanosis or clubbing noted.   SKIN: No rashes.   NEURO: Unremarkable   MENTAL STATUS: Alert   LYMPHATIC: No enlarged nodes    Intake and Output Summary (Last 24 hours) at Date Time    Intake/Output Summary (Last 24 hours) at 01/07/17 1815  Last data filed at 01/07/17 0458   Gross per 24 hour   Intake           831.25 ml   Output                0 ml   Net           831.25 ml       Labs:     Results     Procedure Component Value Units Date/Time    Glucose Whole Blood - POCT [[id number]]   (Abnormal) Collected:  01/07/17 1602     Updated:  01/07/17 1607     POCT - Glucose Whole blood 317 (H) mg/dL     Lactic acid, plasma - Sepsis specimen #2, follow-up to positive initial lactate value. [[id number]]  (Abnormal) Collected:  01/07/17 1523    Specimen:  Blood Updated:  01/07/17 1546     Lactic acid 3.6 (H) mmol/L     UA, Reflex to Microscopic [[id number]]  (Abnormal) Collected:  01/07/17 1217     Updated:  01/07/17 1242     Urine Type Timed Suprapubi     Color, UA Yellow     Clarity, UA Clear     Specific Gravity UA 1.013     Urine pH 6.0     Leukocyte Esterase, UA Negative     Nitrite, UA Negative     Protein, UR Negative     Glucose, UA >=500 (A)     Ketones UA Negative     Urobilinogen, UA Normal mg/dL      Bilirubin, UA Negative     Blood, UA Negative    Lactic acid, plasma [[id number]]  (Abnormal) Collected:  01/07/17 1158    Specimen:  Blood Updated:  01/07/17 1221     Lactic acid 3.8 (H) mmol/L     Glucose Whole Blood - POCT [[id number]]  (Abnormal) Collected:  01/07/17 1114     Updated:  01/07/17 1118     POCT - Glucose Whole blood 308 (H) mg/dL     Hemoglobin I3K [[id number]]  (Abnormal) Collected:  01/07/17 0511    Specimen:  Blood Updated:  01/07/17 0806     Hemoglobin A1C 7.2 (H) %      Average Estimated Glucose 159.9 mg/dL     Lactic acid, plasma [[id number]]  (Abnormal) Collected:  01/07/17 0745     Updated:  01/07/17 0806     Lactic acid 4.7 (HH) mmol/L     Glucose Whole Blood - POCT [[id number]]  (Abnormal) Collected:  01/07/17 0727     Updated:  01/07/17 0738     POCT - Glucose Whole blood 283 (H) mg/dL     Lactic acid, plasma [[id number]]  (Abnormal) Collected:  01/07/17 0616     Updated:  01/07/17 0635     Lactic acid 3.9 (H) mmol/L     Comprehensive metabolic panel [[id number]]  (Abnormal) Collected:  01/07/17 0511    Specimen:  Blood Updated:  01/07/17 0550     Glucose 324 (H) mg/dL  BUN 14.2 mg/dL      Creatinine 0.9 mg/dL      Sodium 604 mEq/L      Potassium 4.1 mEq/L      Chloride 102  mEq/L      CO2 22 mEq/L      Calcium 8.2 mg/dL      Protein, Total 5.9 (L) g/dL      Albumin 3.2 (L) g/dL      AST (SGOT) 8 U/L      ALT 14 U/L      Alkaline Phosphatase 59 U/L      Bilirubin, Total 0.1 (L) mg/dL      Globulin 2.7 g/dL      Albumin/Globulin Ratio 1.2     Anion Gap 13.0    GFR [[id number]] Collected:  01/07/17 0511     Updated:  01/07/17 0550     EGFR >60.0    Magnesium [[id number]] Collected:  01/07/17 0511    Specimen:  Blood Updated:  01/07/17 0550     Magnesium 1.8 mg/dL     CBC and differential [[id number]]  (Abnormal) Collected:  01/07/17 0511    Specimen:  Blood from Blood Updated:  01/07/17 0528     WBC 13.50 (H) x10 3/uL      Hgb 12.2 g/dL      Hematocrit 96.2 %      Platelets 285 x10 3/uL      RBC 4.39 x10 6/uL      MCV 86.3 fL      MCH 27.8 (L) pg      MCHC 32.2 g/dL      RDW 15 %      MPV 9.1 (L) fL      Neutrophils 91.5 %      Lymphocytes Automated 5.6 %      Monocytes 2.0 %      Eosinophils Automated 0.0 %      Basophils Automated 0.1 %      Immature Granulocyte 0.8 %      Nucleated RBC 0.0 /100 WBC      Neutrophils Absolute 12.36 (H) x10 3/uL      Abs Lymph Automated 0.75 x10 3/uL      Abs Mono Automated 0.27 x10 3/uL      Abs Eos Automated 0.00 x10 3/uL      Absolute Baso Automated 0.01 x10 3/uL      Absolute Immature Granulocyte 0.11 (H) x10 3/uL      Absolute NRBC 0.00 x10 3/uL     CULTURE BLOOD AEROBIC AND ANAEROBIC #1 [[id number]] Collected:  01/06/17 2250    Specimen:  Arm from Blood, Intravenous Line Updated:  01/07/17 0236    Narrative:       1 BLUE+1 PURPLE    Lactic acid, plasma - sepsis initial specimen followed by a second specimen if initial result is greater than 2.0 mEq/L [[id number]] Collected:  01/06/17 2351    Specimen:  Blood Updated:  01/07/17 0027     Lactic acid 1.8 mmol/L     Narrative:       Cancel second specimen if the initial lactate level is less  than 2.0 mEq/L.    CULTURE BLOOD AEROBIC AND ANAEROBIC #2 [[id number]] Collected:  01/06/17 2151    Specimen:  Arm  from Blood, Venipuncture Updated:  01/06/17 2339    Narrative:       1 BLUE+1 PURPLE    Rapid influenza A/B antigens [[id number]] Collected:  01/06/17 2250    Specimen:  Nasopharyngeal from Nasal Aspirate Updated:  01/06/17 2313    Narrative:       ORDER#: [id number]                                    ORDERED BY: Reine Just  SOURCE: Nasal Aspirate                               COLLECTED:  01/06/17 22:50  ANTIBIOTICS AT COLL.:                                RECEIVED :  01/06/17 22:54  Influenza Rapid Antigen A&B                FINAL       01/06/17 23:13  01/06/17   Negative for Influenza A and B             Reference Range: Negative      Comprehensive metabolic panel [[id number]]  (Abnormal) Collected:  01/06/17 2151    Specimen:  Blood Updated:  01/06/17 2220     Glucose 277 (H) mg/dL      BUN 78.2 mg/dL      Creatinine 0.9 mg/dL      Sodium 956 (L) mEq/L      Potassium 4.2 mEq/L      Chloride 97 (L) mEq/L      CO2 24 mEq/L      Calcium 9.0 mg/dL      Protein, Total 6.3 g/dL      Albumin 3.5 g/dL      AST (SGOT) 11 U/L      ALT 15 U/L      Alkaline Phosphatase 62 U/L      Bilirubin, Total 0.3 mg/dL      Globulin 2.8 g/dL      Albumin/Globulin Ratio 1.3     Anion Gap 11.0    GFR [[id number]] Collected:  01/06/17 2151     Updated:  01/06/17 2220     EGFR >60.0    Lactic acid, plasma - sepsis initial specimen followed by a second specimen if initial result is greater than 2.0 mEq/L [[id number]]  (Abnormal) Collected:  01/06/17 2151    Specimen:  Blood Updated:  01/06/17 2211     Lactic acid 3.5 (H) mmol/L     Narrative:       Cancel second specimen if the initial lactate level is less  than 2.0 mEq/L.    CBC with differential [[id number]]  (Abnormal) Collected:  01/06/17 2151    Specimen:  Blood from Blood Updated:  01/06/17 2159     WBC 13.99 (H) x10 3/uL      Hgb 13.0 g/dL      Hematocrit 27.2 %      Platelets 322 x10 3/uL      RBC 4.67 x10 6/uL      MCV 84.6 fL      MCH 27.8 (L) pg      MCHC 32.9 g/dL      RDW 15 %       MPV 9.3 (L) fL      Neutrophils 87.0 %      Lymphocytes Automated 7.7 %      Monocytes 4.4 %      Eosinophils Automated 0.0 %  Basophils Automated 0.1 %      Immature Granulocyte 0.8 %      Nucleated RBC 0.0 /100 WBC      Neutrophils Absolute 12.17 (H) x10 3/uL      Abs Lymph Automated 1.08 x10 3/uL      Abs Mono Automated 0.61 x10 3/uL      Abs Eos Automated 0.00 x10 3/uL      Absolute Baso Automated 0.02 x10 3/uL      Absolute Immature Granulocyte 0.11 (H) x10 3/uL      Absolute NRBC 0.00 x10 3/uL           Rads:   Radiological Procedure reviewed.   Radiology Results (24 Hour)     Procedure Component Value Units Date/Time    Chest AP Portable [[id number]] Collected:  01/06/17 2148    Order Status:  Completed Updated:  01/06/17 2153    Narrative:       Indication: Sepsis.    Procedure: AP and lateral views of the chest.    Findings: Normal cardiac size. Some calcified atherosclerotic plaque is  visible in the aortic arch. There is no pneumothorax, consolidation, or  pleural effusion. Degenerative changes of the spine are seen.      Impression:        No acute abnormal findings. No evidence of pneumonia.    Wynema Birch, MD   01/06/2017 9:49 PM          Signed by: Tyna Jaksch, MD 01/07/2017 6:15 PM

## 2017-01-08 LAB — GLUCOSE WHOLE BLOOD - POCT
Whole Blood Glucose POCT: 253 mg/dL — ABNORMAL HIGH (ref 70–100)
Whole Blood Glucose POCT: 264 mg/dL — ABNORMAL HIGH (ref 70–100)
Whole Blood Glucose POCT: 302 mg/dL — ABNORMAL HIGH (ref 70–100)
Whole Blood Glucose POCT: 317 mg/dL — ABNORMAL HIGH (ref 70–100)

## 2017-01-08 LAB — LACTIC ACID, PLASMA: Lactic Acid: 2.2 mmol/L — ABNORMAL HIGH (ref 0.2–2.0)

## 2017-01-08 NOTE — Plan of Care (Signed)
Problem: Inadequate Gas Exchange  Goal: Adequate oxygenation and improved ventilation  Outcome: Progressing   01/08/17 2246   Goal/Interventions addressed this shift   Adequate oxygenation and improved ventilation Assess lung sounds;Provide mechanical and oxygen support to facilitate gas exchange;Monitor SpO2 and treat as needed;Position for maximum ventilatory efficiency;Teach/reinforce use of incentive spirometer 10 times per hour while awake, cough and deep breath as needed;Plan activities to conserve energy: plan rest periods;Consult/collaborate with Respiratory Therapy;Increase activity as tolerated/progressive mobility;Monitor and treat ETCO2

## 2017-01-08 NOTE — Progress Notes (Signed)
CM Progress note:    D/C Disposition: Home w/ daughter and H.H services   Anticipated D/C Date: TBD    Met w/ pt re: d/c planning. Per pt, she has a R.W, home 02, neb Machine, and CPAP Machine at home. Discussed about H.H services and she is agreeable. Referral made to [facility] services for home nursing, P.T/O.T services, and nursing aide services. Pt is ambulating to the bathroom w/ a stable gait, but has dyspnea  per Nurse, Dorathy Daft. Daughter is supportive per pt. CM will continue to monitor status for any other D/C needs.     Julious Payer, MSW, CCM  [facility]  Case Manager

## 2017-01-08 NOTE — Plan of Care (Signed)
Problem: Compromised Hemodynamic Status  Goal: Vital signs and fluid balance maintained/improved  Outcome: Progressing   01/07/17 1828   Goal/Interventions addressed this shift   Vital signs and fluid balance are maintained/improved Position patient for maximum circulation/cardiac output;Monitor/assess vitals and hemodynamic parameters with position changes;Monitor and compare daily weight;Monitor intake and output. Notify LIP if urine output is less than 30 mL/hour.;Monitor/assess lab values and report abnormal values

## 2017-01-08 NOTE — Progress Notes (Addendum)
Home Health Referral          Referral from Sujatha Polina (Case Manager) for home health care upon discharge.    By [employer], the patient has the right to freely choose a home care provider.  Arrangements have been made with:     A company of the patients choosing. We have supplied the patient with a listing of providers in your area who asked to be included and participate in Medicare.   [facility], a home care agency that provides both adult home care services which is a wholly owned and operated by [employer] and participates in [employer]   The preferred provider of your insurance company. Choosing a home care provider other than your insurance company's preferred provider may affect your insurance coverage.    The Home Health Care Referral Form acknowledging the voluntary selection of the home care company has been completed, signed, and is on file.      Home Health Discharge Information     Your doctor has ordered Skilled Nursing, Physical Therapy, Occupational Therapy and Home Health Aide in-home service(s) for you while you recuperate at home, to assist you in the transition from hospital to home.    The agency that you or your representative chose to provide the service:  Name of Home Health Agency: [facility] [phone])]    Name of DME Agency: Patsy Lager [phone], patient has oxygen concentrator at home, portable tanks ordered.    Upon discharge hospital RT to provide portable Oxygen tank.  Patient/family to call above Home Oxygen company on discharge from the hospital to set up exact delivery time of the oxygen supplies to home.     The above services were set up by:  Clenton Pare  [facility] Liaison)   Phone      [phone]    Additional comments:   If you have not heard from your home health agency within 24-48 hours after discharge please call your agency to arrange a time for your first visit.  For any scheduling concerns or questions related to  home health, such as time or date please contact your home health agency at the number listed above.     Signed by: Clenton Pare RN, BSN  Date Time: 01/08/17 11:45 AM

## 2017-01-08 NOTE — Discharge Instr - AVS First Page (Addendum)
Area on Aging:    Geophysicist/field seismologist services, Disease Prevention and Health promotion, Home Delivered Meals, Information and Assistance, [employer] [address] [place], Tax Arts administrator, and [employer] Counseling and Assistance Program     [facility]ging Programs [facility] Services [place], [place]  [phone]  [phone]  [facility] [place], [place]  [phone]  [facility] [place], [place] [phone]  [facility] [place], [place]  [phone]  [facility]ult [facility] [place], [place]   [phone]  Adult [facility] [place], [place]   [phone] or [phone]  Other Day Programs:   [facility]    [phone]    Life Line:   Merian Capron       800-LIFELINE [phone])  [facility] [place] [facility] Life       [phone]  [facility] Alert [place]) [phone] or novamedicalalert.com  [employer] (VRI)   [phone]    [place] Information [employer] for [employer] and [employer] 2-1-1    [employer] ([employer] and Assistance Program)  [phone]    [employer]  [phone] or GraduateSites.it    Retirement Living Source Book  419-573-0039 or [phone] [phone]   Call for your FREE guide on Nursing Homes, Assisted Living, Retirement Homes, Day Cares, and other Elderly Resources    Companion Care/Personal Care (These programs are private pay ONLY.  Please call for their individual rates.)  [facility] Senior [facility] [phone]  Adult Companion Care [phone]  Senior Helpers   [phone]  [facility]iting [place] [place]  [phone]  [employer]ers   [phone]     (minimum 2 hours/day)  Elder Care Safe at [facility] [phone]  [facility] Options Senior Care [phone]    [facility] Social Services  [phone]  [facility] Social Services [phone]  [facility] Social Services  [phone]    Alzheimer's Association   [phone]  Medicare      800-MEDICARE [phone])  Gwenevere Abbot [phone]  Social Security    [phone]    Transportation:   [employer] Bus Services (Need 48 hours notice)    [phone]  [employer] Caregivers (Medical transport for frail elderly and disabled) [phone]  [employer] (Medicaid Recipients ONLY)      [phone]  AAA transport Tax inspector and Wheelchair transport call for rates)   [phone] option 2  H&M transport (Wheelchair transport call for rates)    [phone]  Home Helpers (Companion Transports Call for rates)    [phone]      Home Health Discharge Information     Your doctor has ordered Skilled Nursing, Physical Therapy, Occupational Therapy and Home Health Aide in-home service(s) for you while you recuperate at home, to assist you in the transition from hospital to home.    The agency that you or your representative chose to provide the service:  Name of Home Health Agency: [facility] [phone])]    Name of DME Agency: Patsy Lager [phone], patient has oxygen concentrator, portable tanks ordered.    Upon discharge hospital RT to provide portable Oxygen tank.  Patient/family to call above Home Oxygen company on discharge from the hospital to set up exact delivery time of the oxygen supplies to home.     The above services were set up by:  Clenton Pare  [facility] Liaison)   Phone      [phone]    Additional comments:   If you have not heard from your home health agency within 24-48 hours after discharge please call  your agency to arrange a time for your first visit.  For any scheduling concerns or questions related to home health, such as time or date please contact your home health agency at the number listed above.

## 2017-01-08 NOTE — Progress Notes (Signed)
Pulmonary Progress Note   "[facility]"    Date Time: 01/08/17 7:51 AM  Patient Name: Rachael [name]  Attending Physician: Carmelia Bake, MD      Assessment:     Patient Active Problem List   Diagnosis   . COPD exacerbation   . Acute respiratory failure with hypoxia   . Lactic acidosis   . Acute bronchitis due to Rhinovirus   . Sepsis, due to unspecified organism   . Hypoxia   . Hyponatremia   . Leukocytosis   . Anxiety   . GERD (gastroesophageal reflux disease)   . HLD (hyperlipidemia)   . HTN (hypertension)   . Hypothyroid   . PMR (polymyalgia rheumatica)   . OSA (obstructive sleep apnea)   . Diabetes mellitus, type II     -COPD exacerbation, failed treatment as outpatient.  -Clear chest x-ray.  -Severe OSA with CPAP of 9 cm H2O +2 L oxygen per minute and compliant with tx.  -Suspect tracheobronchomalacia.  -Presumed PMR on chronic steroid therapy.  -Obesity      Plan:   -Continue current treatment with tapering IV steroids, DuoNeb, and Mucomyst,  -Acapella  -Continue azithromycin and ceftriaxone for 5 days.  -Ambulate as tolerated.  -Acapella valve.  -CPAP at 9 cm H2O plus oxygen 2 L/m to keep saturation above 90 percent at nighttime.  -Sputum culture.  -Expect possible discharge in 48-72 hours.      Interval History   HPI: Patient is feeling about the same, denies any chest pain.  Continues to have significant aches, particularly wheezing and dyspnea with minimal exertion.       Interval History/24 hr. Events: None    Physical Exam:   Vital signs for last 24 hours:  Temp:  [97.3 F (36.3 C)-100 F (37.8 C)] 97.9 F (36.6 C)  Heart Rate:  [93-118] 93  Resp Rate:  [16-22] 17  BP: (138-170)/(80-94) 138/84    Intake and Output Summary (Last 24 hours) at Date Time    Intake/Output Summary (Last 24 hours) at 01/08/17 0751  Last data filed at 01/08/17 0532   Gross per 24 hour   Intake              350 ml   Output                0 ml   Net              350 ml       GENERAL:  No acute distress.  HEENT: Neck supple, oropharynx clear, No lymphadenopathy.   LUNGS: Mild-to-moderate expiratory wheezing.  Minimal bilateral scattered inspiratory crackles.  CARDIOVASCULAR: Regular rate without audible murmurs.   ABDOMEN: Bowel sounds present. Soft, non-tender, no organomegaly noted.   EXTREMITIES: 1-2+ bilateral lower extremity edema, no cyanosis or clubbing noted.   SKIN: No rashes.   NEURO: Unremarkable   MENTAL STATUS: Alert   LYMPHATIC: No enlarged nodes      Meds:   Scheduled Meds:  Current Facility-Administered Medications   Medication Dose Route Frequency   . acetylcysteine  1 mL Nebulization Q6H WA   . albuterol-ipratropium  3 mL Nebulization Q6H SCH   . azithromycin  500 mg Intravenous Q24H   . cefTRIAXone  1 g Intravenous Q24H   . dilTIAZem  300 mg Oral Daily   . DULoxetine  60 mg Oral BID   . fluticasone furoate-vilanterol  1 puff Inhalation QAM   . folic acid  1 mg  Oral Daily   . gabapentin  300 mg Oral BID   . guaiFENesin  600 mg Oral Q12H SCH   . insulin glargine  12 Units Subcutaneous Daily   . lactobacillus/streptococcus  1 capsule Oral Daily   . levothyroxine  50 mcg Oral Daily at 0600   . losartan  100 mg Oral Daily   . methylPREDNISolone  60 mg Intravenous Q8H   . metoprolol tartrate  25 mg Oral BID   . pantoprazole  40 mg Oral QAM AC   . polyethylene glycol  17 g Oral Daily     Continuous Infusions:  PRN Meds:.acetaminophen, albuterol-ipratropium, ALPRAZolam, Nursing communication: Adult Hypoglycemia Treatment Algorithm **AND** dextrose **AND** dextrose **AND** glucagon (rDNA), HYDROcodone-homatropine, insulin lispro, insulin lispro, lactulose, ondansetron, traMADol, traZODone      Labs:     Results     Procedure Component Value Units Date/Time    Glucose Whole Blood - POCT [[id number]]  (Abnormal) Collected:  01/08/17 0702     Updated:  01/08/17 0711     POCT - Glucose Whole blood 253 (H) mg/dL     CULTURE BLOOD AEROBIC AND ANAEROBIC #1 [[id number]] Collected:  01/06/17 2250       Specimen:  Arm from Blood, Intravenous Line Updated:  01/08/17 0321    Narrative:       ORDER#: [id number]                                    ORDERED BY: Reine Just  SOURCE: Blood, Intravenous Line Peripheral, Left ac  COLLECTED:  01/06/17 22:50  ANTIBIOTICS AT COLL.:                                RECEIVED :  01/07/17 02:36  Culture Blood Aerobic and Anaerobic        PRELIM      01/08/17 03:21  01/08/17   No Growth after 1 day/s of incubation.      CULTURE BLOOD AEROBIC AND ANAEROBIC #2 [[id number]] Collected:  01/06/17 2151    Specimen:  Arm from Blood, Venipuncture Updated:  01/08/17 0021    Narrative:       ORDER#: [id number]                                    ORDERED BY: Reine Just  SOURCE: Blood, Venipuncture lac                      COLLECTED:  01/06/17 21:51  ANTIBIOTICS AT COLL.:                                RECEIVED :  01/06/17 23:38  Culture Blood Aerobic and Anaerobic        PRELIM      01/08/17 00:21  01/08/17   No Growth after 1 day/s of incubation.      Glucose Whole Blood - POCT [[id number]]  (Abnormal) Collected:  01/07/17 2036     Updated:  01/07/17 2053     POCT - Glucose Whole blood 296 (H) mg/dL     Glucose Whole Blood - POCT [[id number]]  (Abnormal) Collected:  01/07/17 7425     Updated:  01/07/17 1607  POCT - Glucose Whole blood 317 (H) mg/dL     Lactic acid, plasma - Sepsis specimen #2, follow-up to positive initial lactate value. [[id number]]  (Abnormal) Collected:  01/07/17 1523    Specimen:  Blood Updated:  01/07/17 1546     Lactic acid 3.6 (H) mmol/L     UA, Reflex to Microscopic [[id number]]  (Abnormal) Collected:  01/07/17 1217     Updated:  01/07/17 1242     Urine Type Timed Suprapubi     Color, UA Yellow     Clarity, UA Clear     Specific Gravity UA 1.013     Urine pH 6.0     Leukocyte Esterase, UA Negative     Nitrite, UA Negative     Protein, UR Negative     Glucose, UA >=500 (A)     Ketones UA Negative     Urobilinogen, UA Normal mg/dL      Bilirubin, UA Negative     Blood,  UA Negative    Lactic acid, plasma [[id number]]  (Abnormal) Collected:  01/07/17 1158    Specimen:  Blood Updated:  01/07/17 1221     Lactic acid 3.8 (H) mmol/L     Glucose Whole Blood - POCT [[id number]]  (Abnormal) Collected:  01/07/17 1114     Updated:  01/07/17 1118     POCT - Glucose Whole blood 308 (H) mg/dL     Hemoglobin G2X [[id number]]  (Abnormal) Collected:  01/07/17 0511    Specimen:  Blood Updated:  01/07/17 0806     Hemoglobin A1C 7.2 (H) %      Average Estimated Glucose 159.9 mg/dL     Lactic acid, plasma [[id number]]  (Abnormal) Collected:  01/07/17 0745     Updated:  01/07/17 6440     Lactic acid 4.7 (HH) mmol/L             Radiology:     Radiology Results (24 Hour)     ** No results found for the last 24 hours. **            Signed by: Tyna Jaksch, MD  Date/Time: 01/08/2017 7:51 AM

## 2017-01-08 NOTE — Progress Notes (Signed)
INTERNAL MEDICINE  PROGRESS NOTE     Patient: Rachael [name]  Date: 01/08/2017   LOS: 1 Days  Admission Date: 01/06/2017   MRN: [id number]  Attending: Carmelia Bake, MD       SUBJECTIVE     C.C continues to have cough but dyspnea has improved. No fevers. Lactic acid 3.6  WBC 13.50  MEDICATIONS     Current Facility-Administered Medications   Medication Dose Route Frequency   . acetylcysteine  1 mL Nebulization Q6H WA   . albuterol-ipratropium  3 mL Nebulization Q6H SCH   . azithromycin  500 mg Intravenous Q24H   . cefTRIAXone  1 g Intravenous Q24H   . dilTIAZem  300 mg Oral Daily   . DULoxetine  60 mg Oral BID   . fluticasone furoate-vilanterol  1 puff Inhalation QAM   . folic acid  1 mg Oral Daily   . gabapentin  300 mg Oral BID   . guaiFENesin  600 mg Oral Q12H SCH   . insulin glargine  12 Units Subcutaneous Daily   . lactobacillus/streptococcus  1 capsule Oral Daily   . levothyroxine  50 mcg Oral Daily at 0600   . losartan  100 mg Oral Daily   . methylPREDNISolone  60 mg Intravenous Q8H   . metoprolol tartrate  25 mg Oral BID   . pantoprazole  40 mg Oral QAM AC   . polyethylene glycol  17 g Oral Daily         ROS   Cough and dyspnea  No fever or chills  +weakness and rib pain      PHYSICAL EXAM     Objective:  I/O last 3 completed shifts:  In: 1181.25 [P.O.:150; I.V.:481.25; IV Piggyback:550]  Out: 0      Vitals:    01/08/17 0532 01/08/17 0744 01/08/17 0921 01/08/17 1230   BP: 138/84  179/79 173/82   Pulse: 93 93 (!) 103 89   Resp: 17 22 (!) 24 (!) 24   Temp: 97.9 F (36.6 C)  98.1 F (36.7 C) 98.4 F (36.9 C)   TempSrc: Temporal Artery  Temporal Artery    SpO2: 98% 90% 94% 94%   Weight:       Height:           Exam: Patient is awake, alert and oriented x 3, in no acute distress  HEENT: PERRLA, EOMI, Oral cavity well hydrated.  Neck: supple, without JVD  Chest: diminished breath sounds with rhonchi bilaterally. No crackles   CVS: S1, S2 normal, no rubs, no murmurs, no gallops.  Regular rate and  rhythm.  Abdomen: soft and non tender with good bowel sounds.  Musculoskeletal: No pitting edema, has chronic lymphedema no cyanosis/clubbing  NEURO:  No Focal neurological deficits      LABS       Recent Labs  Lab 01/07/17  0511 01/06/17  2151   WBC 13.50* 13.99*   RBC 4.39 4.67   Hgb 12.2 13.0   Hematocrit 37.9 39.5   MCV 86.3 84.6   Platelets 285 322       Recent Labs  Lab 01/07/17  0511 01/06/17  2151   Sodium 137 132*   Potassium 4.1 4.2   Chloride 102 97*   CO2 22 24   BUN 14.2 15.1   Creatinine 0.9 0.9   Glucose 324* 277*   Calcium 8.2 9.0   Magnesium 1.8  --  Invalid input(s): OSMOL    Recent Labs  Lab 01/07/17  0511 01/06/17  2151   ALT 14 15   AST (SGOT) 8 11   Bilirubin, Total 0.1* 0.3   Albumin 3.2* 3.5   Alkaline Phosphatase 59 62             (!) 264  Invalid input(s): BLOODCULTURE      RADIOLOGY     Radiological Procedure reviewed.  Chest AP Portable   Final Result    No acute abnormal findings. No evidence of pneumonia.      Wynema Birch, MD    01/06/2017 9:49 PM            Patient Active Problem List    Diagnosis Date Noted   . Hypoxia 01/07/2017   . Hyponatremia 01/07/2017   . Leukocytosis 01/07/2017   . Anxiety 01/07/2017   . GERD (gastroesophageal reflux disease) 01/07/2017   . HLD (hyperlipidemia) 01/07/2017   . HTN (hypertension) 01/07/2017   . Hypothyroid 01/07/2017   . PMR (polymyalgia rheumatica) 01/07/2017   . OSA (obstructive sleep apnea) 01/07/2017   . Diabetes mellitus, type II 01/07/2017   . Sepsis, due to unspecified organism    . Acute bronchitis due to Rhinovirus 03/29/2016   . COPD exacerbation 03/25/2016   . Acute respiratory failure with hypoxia 03/25/2016   . Lactic acidosis 03/25/2016       Assessment and Plan:    Sepsis with Lactic acidosis. Lactic acid now trending down to 3.6. Continue IV Ceftriaxone and Zithromax and follow cultures   COPD exacerbation. Continue IV Steroids, Duonebs scheduled and IV antibiotics. Pulmonary consulted   OSA. CPAP at HS  ordered   Hypertension/ tachycardia uncontrolled. Increase Diltiazem to 300 mg daily   Type 2 DM on Lantus Insulin and SSI. Increase Lantus to 12 units daily   PMR on Cymbalta 60 mg BID   Nocturnal Hypoxia on nighttime Oxygen   Hyperlipidemia   Chronic back pain on PRN Tramadol    Request PT/OT evaluation      Estimate length of stay 2-3 days   Code status: Full   Medical necessity for stay  Carmelia Bake, MD  01/08/2017 2:43 PM  Time spent for evaluation and management and coordination of care

## 2017-01-09 LAB — CBC AND DIFFERENTIAL
Absolute NRBC: 0 10*3/uL
Basophils Absolute Automated: 0.03 10*3/uL (ref 0.00–0.20)
Basophils Automated: 0.2 %
Eosinophils Absolute Automated: 0 10*3/uL (ref 0.00–0.70)
Eosinophils Automated: 0 %
Hematocrit: 38.7 % (ref 37.0–47.0)
Hgb: 12.6 g/dL (ref 12.0–16.0)
Immature Granulocytes Absolute: 0.3 10*3/uL — ABNORMAL HIGH
Immature Granulocytes: 2.4 %
Lymphocytes Absolute Automated: 1.13 10*3/uL (ref 0.50–4.40)
Lymphocytes Automated: 9.2 %
MCH: 27.6 pg — ABNORMAL LOW (ref 28.0–32.0)
MCHC: 32.6 g/dL (ref 32.0–36.0)
MCV: 84.7 fL (ref 80.0–100.0)
MPV: 9.2 fL — ABNORMAL LOW (ref 9.4–12.3)
Monocytes Absolute Automated: 0.62 10*3/uL (ref 0.00–1.20)
Monocytes: 5.1 %
Neutrophils Absolute: 10.17 10*3/uL — ABNORMAL HIGH (ref 1.80–8.10)
Neutrophils: 83.1 %
Nucleated RBC: 0 /100 WBC (ref 0.0–1.0)
Platelets: 302 10*3/uL (ref 140–400)
RBC: 4.57 10*6/uL (ref 4.20–5.40)
RDW: 15 % (ref 12–15)
WBC: 12.25 10*3/uL — ABNORMAL HIGH (ref 3.50–10.80)

## 2017-01-09 LAB — GLUCOSE WHOLE BLOOD - POCT
Whole Blood Glucose POCT: 207 mg/dL — ABNORMAL HIGH (ref 70–100)
Whole Blood Glucose POCT: 294 mg/dL — ABNORMAL HIGH (ref 70–100)
Whole Blood Glucose POCT: 237 mg/dL — ABNORMAL HIGH (ref 70–100)
Whole Blood Glucose POCT: 370 mg/dL — ABNORMAL HIGH (ref 70–100)

## 2017-01-09 MED ORDER — GABAPENTIN 300 MG PO CAPS
600.0000 mg | ORAL_CAPSULE | Freq: Two times a day (BID) | ORAL | Status: DC
Start: 2017-01-09 — End: 2017-01-12
  Administered 2017-01-09 – 2017-01-12 (×6): 600 mg via ORAL
  Filled 2017-01-09 (×6): qty 2

## 2017-01-09 MED ORDER — FUROSEMIDE 10 MG/ML IJ SOLN
20.0000 mg | Freq: Once | INTRAMUSCULAR | Status: AC
Start: 2017-01-10 — End: 2017-01-10
  Administered 2017-01-10: 09:00:00 20 mg via INTRAVENOUS
  Filled 2017-01-09: qty 2

## 2017-01-09 MED ORDER — GABAPENTIN 300 MG PO CAPS
300.0000 mg | ORAL_CAPSULE | Freq: Once | ORAL | Status: AC
Start: 2017-01-09 — End: 2017-01-09
  Administered 2017-01-09: 10:00:00 300 mg via ORAL
  Filled 2017-01-09: qty 1

## 2017-01-09 MED ORDER — INSULIN LISPRO 100 UNIT/ML SC SOLN
5.0000 [IU] | Freq: Once | SUBCUTANEOUS | Status: AC
Start: 2017-01-09 — End: 2017-01-09
  Administered 2017-01-09: 21:00:00 5 [IU] via SUBCUTANEOUS

## 2017-01-09 MED ORDER — METHYLPREDNISOLONE SODIUM SUCC 40 MG IJ SOLR
40.0000 mg | Freq: Two times a day (BID) | INTRAMUSCULAR | Status: DC
Start: 2017-01-09 — End: 2017-01-11
  Administered 2017-01-09 – 2017-01-11 (×4): 40 mg via INTRAVENOUS
  Filled 2017-01-09 (×4): qty 1

## 2017-01-09 MED ORDER — ALBUTEROL-IPRATROPIUM 2.5-0.5 (3) MG/3ML IN SOLN
3.0000 mL | RESPIRATORY_TRACT | Status: DC
Start: 2017-01-10 — End: 2017-01-12
  Administered 2017-01-10 – 2017-01-12 (×10): 3 mL via RESPIRATORY_TRACT
  Filled 2017-01-09 (×11): qty 3

## 2017-01-09 MED ORDER — METHYLPREDNISOLONE SODIUM SUCC 40 MG IJ SOLR
40.0000 mg | Freq: Three times a day (TID) | INTRAMUSCULAR | Status: DC
Start: 2017-01-09 — End: 2017-01-09

## 2017-01-09 NOTE — Plan of Care (Signed)
Problem: Inadequate Tissue Perfusion-Venous  Goal: Tissue perfusion is adequate-venous  Outcome: Progressing   01/09/17 2341   Goal/Interventions addressed this shift   Tissue perfusion is adequate-venous  Increase activity as tolerated / progressive mobility;Elevate feet when in chair;Teach/review/reinforce ankle pump exercises       Problem: Safety  Goal: Patient will be free from injury during hospitalization  Outcome: Progressing   01/09/17 2341   Goal/Interventions addressed this shift   Patient will be free from injury during hospitalization  Assess patient's risk for falls and implement fall prevention plan of care per policy;Provide and maintain safe environment;Use appropriate transfer methods;Ensure appropriate safety devices are available at the bedside;Include patient/ family/ care giver in decisions related to safety;Hourly rounding;Provide alternative method of communication if needed (communication boards, writing)       Problem: Pain  Goal: Pain at adequate level as identified by patient  Outcome: Progressing   01/09/17 2341   Goal/Interventions addressed this shift   Pain at adequate level as identified by patient Identify patient comfort function goal;Assess for risk of opioid induced respiratory depression, including snoring/sleep apnea. Alert healthcare team of risk factors identified.;Assess pain on admission, during daily assessment and/or before any "as needed" intervention(s);Evaluate if patient comfort function goal is met;Offer non-pharmacological pain management interventions;Evaluate patient's satisfaction with pain management progress

## 2017-01-09 NOTE — Progress Notes (Signed)
INTERNAL MEDICINE  PROGRESS NOTE     Patient: Rachael [name]  Date: 01/09/2017   LOS: 2 Days  Admission Date: 01/06/2017   MRN: [id number]  Attending: Carmelia Bake, MD       SUBJECTIVE     C.C continues to have cough but dyspnea has improved. No fevers. Lactic acid 2.2  WBC 12.25  MEDICATIONS     Current Facility-Administered Medications   Medication Dose Route Frequency   . acetylcysteine  1 mL Nebulization Q6H WA   . albuterol-ipratropium  3 mL Nebulization Q6H SCH   . azithromycin  500 mg Intravenous Q24H   . cefTRIAXone  1 g Intravenous Q24H   . dilTIAZem  300 mg Oral Daily   . DULoxetine  60 mg Oral BID   . fluticasone furoate-vilanterol  1 puff Inhalation QAM   . folic acid  1 mg Oral Daily   . gabapentin  600 mg Oral BID   . guaiFENesin  600 mg Oral Q12H SCH   . insulin glargine  12 Units Subcutaneous Daily   . lactobacillus/streptococcus  1 capsule Oral Daily   . levothyroxine  50 mcg Oral Daily at 0600   . losartan  100 mg Oral Daily   . methylPREDNISolone  60 mg Intravenous Q8H   . metoprolol tartrate  25 mg Oral BID   . pantoprazole  40 mg Oral QAM AC   . polyethylene glycol  17 g Oral Daily         ROS   Cough and dyspnea  No fever or chills  +weakness and rib pain      PHYSICAL EXAM     Objective:  I/O last 3 completed shifts:  In: 820 [P.O.:470; IV Piggyback:350]  Out: 950 [Urine:950]     Vitals:    01/09/17 0500 01/09/17 0717 01/09/17 0938 01/09/17 1207   BP: 166/84  141/76 155/81   Pulse: 86 84  82   Resp: 20 20 20 22    Temp: 97 F (36.1 C)   98.1 F (36.7 C)   TempSrc: Temporal Artery  Oral    SpO2:  92% 93% 94%   Weight: 79.8 kg (176 lb)      Height:           Exam: Patient is awake, alert and oriented x 3, in no acute distress  HEENT: PERRLA, EOMI, Oral cavity well hydrated.  Neck: supple, without JVD  Chest: diminished breath sounds with rhonchi bilaterally. No crackles   CVS: S1, S2 normal, no rubs, no murmurs, no gallops.  Regular rate and rhythm.  Abdomen: soft and non tender with  good bowel sounds.  Musculoskeletal: No pitting edema, has chronic lymphedema no cyanosis/clubbing  NEURO:  No Focal neurological deficits      LABS       Recent Labs  Lab 01/09/17  0448 01/07/17  0511 01/06/17  2151   WBC 12.25* 13.50* 13.99*   RBC 4.57 4.39 4.67   Hgb 12.6 12.2 13.0   Hematocrit 38.7 37.9 39.5   MCV 84.7 86.3 84.6   Platelets 302 285 322       Recent Labs  Lab 01/07/17  0511 01/06/17  2151   Sodium 137 132*   Potassium 4.1 4.2   Chloride 102 97*   CO2 22 24   BUN 14.2 15.1   Creatinine 0.9 0.9   Glucose 324* 277*   Calcium 8.2 9.0   Magnesium 1.8  --  Invalid input(s): OSMOL    Recent Labs  Lab 01/07/17  0511 01/06/17  2151   ALT 14 15   AST (SGOT) 8 11   Bilirubin, Total 0.1* 0.3   Albumin 3.2* 3.5   Alkaline Phosphatase 59 62             (!) 294  Invalid input(s): BLOODCULTURE      RADIOLOGY     Radiological Procedure reviewed.  Chest AP Portable   Final Result    No acute abnormal findings. No evidence of pneumonia.      Wynema Birch, MD    01/06/2017 9:49 PM            Patient Active Problem List    Diagnosis Date Noted   . Hypoxia 01/07/2017   . Hyponatremia 01/07/2017   . Leukocytosis 01/07/2017   . Anxiety 01/07/2017   . GERD (gastroesophageal reflux disease) 01/07/2017   . HLD (hyperlipidemia) 01/07/2017   . HTN (hypertension) 01/07/2017   . Hypothyroid 01/07/2017   . PMR (polymyalgia rheumatica) 01/07/2017   . OSA (obstructive sleep apnea) 01/07/2017   . Diabetes mellitus, type II 01/07/2017   . Sepsis, due to unspecified organism    . Acute bronchitis due to Rhinovirus 03/29/2016   . COPD exacerbation 03/25/2016   . Acute respiratory failure with hypoxia 03/25/2016   . Lactic acidosis 03/25/2016       Assessment and Plan:    Sepsis with Lactic acidosis. Lactic acid now trending down to 2.2. Continue IV Ceftriaxone and Zithromax and follow cultures   COPD exacerbation. Taper IV Steroids, Duonebs scheduled and IV antibiotics. Pulmonary consulted. PRN antitussives, wean off  Oxygen during day time as long as saturations > 90%   OSA. CPAP at HS ordered   Hypertension/ tachycardia stable. Increased Diltiazem to 300 mg daily   Type 2 DM on Lantus Insulin and SSI. Increase Lantus to 12 units daily   PMR on Cymbalta 60 mg BID   Nocturnal Hypoxia on nighttime Oxygen   Hyperlipidemia   Chronic back pain on PRN Tramadol    Request PT/OT evaluation      Estimate length of stay 2-3 days   Code status: Full   Medical necessity for stay  Carmelia Bake, MD  01/09/2017 3:39 PM  Time spent for evaluation and management and coordination of care

## 2017-01-09 NOTE — Plan of Care (Signed)
Problem: Safety  Goal: Patient will be free from injury during hospitalization  Outcome: Progressing

## 2017-01-09 NOTE — OT Eval Note (Signed)
[facility]  [address]  [place], [place]. [address]    Department of Rehabilitation Services  [phone]    Occupational Therapy Evaluation    Patient: Rachael [name]    MRN#: [id number]     M201/M201-B    Time of treatment: Time Calculation  OT Received On: 01/09/17  Start Time: 1442  Stop Time: 1457  Time Calculation (min): 15 min  Total Treatment Time (min): 15  OT Visit Number: 1    Consult received for Rachael [name] for OT Evaluation and Treatment.  Patient's medical condition is appropriate for Occupational therapy intervention at this time.    Assessment:   Rachael [name] is a 81 y.o. female admitted 01/06/2017.   Expanded chart review completed including review of labs, review of imaging, review of vitals, review of H&P and physician progress notes and review of consulting physician notes .  Pt demo the ability to perform ADLs, transfers and functional mobility with RW at a Supervision to Mod I level. Pt on 3L of ox via NC. Pt Ox sat range from 92-95% during session. Pt does not demo the need for skilled OT intervention at this time.     Complexity Chart Review Performance Deficits Clinical Decision Making Hx/Comorbidities Assistance needed   Low Brief 1-3 Limited options None None (or at baseline)   Moderate Expanded 3-5 Several Options 1-2 Min/Mod assist (not at baseline)   High Extensive 5 or more Multiple options 3 or more Max/dependent (not at baseline     Therapy Diagnosis: decreased endurance/ activity engagement due to COPD exacerbation.   Rehabilitation Potential: Prognosis: Good;With family (also recommend Home PT)      Plan:   OT Frequency Recommended: one time visit   Treatment Interventions: No skilled interventions needed at this time          Risks/Benefits/POC Discussed with Pt/Family: With patient    Goals:                                      Discharge Recommendations:   Based on today's session patient's discharge recommendation is the following:  Discharge Recommendation: Home with no needs (No OT needs recommend Home PT).      Precautions and Contraindications:     Precautions  Weight Bearing Status: no restrictions      Medical Diagnosis: COPD exacerbation [J44.1]  Elevated lactic acid level [R79.89]  Other elevated white blood cell (WBC) count [D72.828]    History of Present Illness: Rachael [name] is a 81 y.o. female admitted on 01/06/2017 with "the chief complaint of shortness of breath since one week ago.  Patient symptoms initially started with coughing, with productive cloudy mucus, congestion, fever and malaise/fatigue.  Patient then started having short of breath with wheezing, she was treated as outpatient with antibiotics and prednisone, which she completed yesterday, however, patient continued to have shortness of breath.  Patient does have a history of chronic obstructive pulmonary disease on chronic home oxygen at 2 L/m.  Patient states that her nephew and daughter were also sick as well.  Patient denies any recent hospitalization, denies any recent travel, or recent surgery. Patient denies any headache, lightheadedness, chills, chest pain, palpitation, nausea, vomiting, abdominal pain, diarrhea, blood in stool, melena, urinary symptoms, numbness or focal weakness." per H&P         Patient Active Problem List   Diagnosis   .  COPD exacerbation   . Acute respiratory failure with hypoxia   . Lactic acidosis   . Acute bronchitis due to Rhinovirus   . Sepsis, due to unspecified organism   . Hypoxia   . Hyponatremia   . Leukocytosis   . Anxiety   . GERD (gastroesophageal reflux disease)   . HLD (hyperlipidemia)   . HTN (hypertension)   . Hypothyroid   . PMR (polymyalgia rheumatica)   . OSA (obstructive sleep apnea)   . Diabetes mellitus, type II        Past Medical/Surgical History:  Past Medical History:   Diagnosis Date   . Anxiety    . Arthritis    . 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 Chronic cholecystitis with  calculus 10/31/2015   . Chronic obstructive pulmonary disease    . Gastroesophageal reflux disease    . Hiatal hernia    . Hyperlipidemia    . Hypertension    . Hypothyroidism    . Incontinent of urine 2014    -WEARS DIAPERS-WILL BRING HER OWN   . Low back pain    . Lymphedema of right lower extremity    . PMR (polymyalgia rheumatica) 01/07/2017   . Polymyalgia rheumatica 09/2014   . Sleep apnea     USES CPAP NIGHTLY   . Type 2 diabetes mellitus, controlled    . Type II or unspecified type diabetes mellitus without mention of complication, not stated as uncontrolled    . Venous insufficiency of right leg 2016    also has lymphedema-KEEPS HER LEGS WRAPPED EXCEPT FOR WHEN SHE BATHES      Past Surgical History:   Procedure Laterality Date   . BACK SURGERY  2014   . CARPAL TUNNEL RELEASE Bilateral 1978   . HYSTERECTOMY  1985   . ROBOTIC, CHOLECYSTECTOMY N/A 10/31/2015    Procedure: ROBOT ASSISTED, LAPAROSCOPIC, CHOLECYSTECTOMY, UMBILICAL HERNIA REPAIR;  Surgeon: Alen Blew, MD;  Location: [facility];  Service: General;  Laterality: N/A;         X-Rays/Tests/Labs:  Chest Ap Portable    Result Date: 01/06/2017   No acute abnormal findings. No evidence of pneumonia. Wynema Birch, MD 01/06/2017 9:49 PM      Social History:  Prior Level of Function  Prior level of function: Independent with ADLs, Ambulates with assistive device (IND to Mod I for selfcare)  Assistive Device: Single point cane  Baseline Activity Level: Community ambulation, Household ambulation  Driving: restricted (comment) (only during the day)  DME Currently at Home: Single point cane, Front wheel walker, Home O2  Home Living Arrangements  Living Arrangements: Children  Type of Home: House  Home Layout: Multi-level  Bathroom Shower/Tub: Medical sales representative: Midwife: Grab bars in shower, Shower chair  DME Currently at Home: Single point cane, Front wheel walker, Home O2      Subjective:   Patient is  agreeable to participation in the therapy session. Nursing clears patient for therapy.     Pain Assessment  Pain Assessment: No/denies pain.        Objective:   Observation of Patient/Vital Signs: Ox sat on 3L via NC 92-95% Patient is in bed with telemetry in place.         Cognition/Neuro Status  Arousal/Alertness: Appropriate responses to stimuli  Attention Span: Appears intact  Orientation Level: Oriented X4  Memory: Appears intact  Following Commands: independent  Safety  Awareness: independent  Insights: Fully aware of deficits  Problem Solving: Able to problem solve independently  Behavior: cooperative  Motor Planning: intact  Coordination: intact  Hand Dominance: right handed    Gross ROM  Gross ROM: within functional limits  Right Upper Extremity ROM: within functional limits  Left Upper Extremity ROM: within functional limits  Gross Strength  Right Upper Extremity Strength: within functional limits  Left Upper Extremity Strength: within functional limits     Tone: within functional limits    Sensory  Auditory: intact  Tactile - Light Touch: intact  Visual Acuity: wears glasses  Vision - Complex Assessment  Acuity: Able to read clock/calendar on wall without difficulty    Self-care and Home Management  Grooming: Supervision;standing at sink  UB Dressing: Supervision;set up  LB Dressing: Supervision;Increased time to complete  Toileting: Modified Independent  Functional Transfers: Supervision;increased time to complete    Mobility and Transfers  Scooting to EOB: Supervision;Increased Time;using bedrail  Supine to Sit: Supervision;Increased Time;using bedrail  Sit to Supine: Modified Independent  Sit to Stand: Supervision  Bed to Chair: Supervision  Functional Mobility/Ambulation: Supervision     Balance  Static Sitting Balance: Modified Independent  Dyanamic Sitting Balance: Modified Independent  Static Standing Balance: Modified Independent  Dynamic Standing Balance: Supervision         AM-PACT "6 Clicks"  Daily Activity Inpatient Short Form  Inpatient AM-PACT Performed?: yes  Put On/Take Off Lower Body Clothing: A little  Assist with Bathing: A little  Assist with Toileting: None  Put On/Take Off Upper Body Clothing: A little  Assist with Grooming: None  Assist with Eating: None  OT Daily Activity Raw Score: 21  CMS 0-100% Score: 32.79%    PMP - Progressive Mobility Protocol   PMP Activity: Step 6 - Walks in Room    Treatment Activities:     Educated the patient to role of occupational therapy, plan of care, goals of therapy and safety with mobility and ADLs, energy conservation techniques, pursed lip breathing, home safety.  Levie Wages, OTR/L

## 2017-01-09 NOTE — PT Eval Note (Addendum)
[facility]  [address]  [place], [place]. [address]    Department of Rehabilitation  [phone]    Physical Therapy Evaluation    Patient: Rachael [name]    MRN#: [id number]     M201/M201-B    Time of treatment: Time Calculation  PT Received On: 01/09/17  Start Time: 1304  Stop Time: 1350  Time Calculation (min): 46 min    PT Visit Number: 1    Consult received for Rachael [name] for PT Evaluation and Treatment.  Patient's medical condition is appropriate for Physical therapy intervention at this time.      Assessment:   Rachael [name] is a 81 y.o. female admitted 01/06/2017.  Pt's functional mobility is impacted by:  decreased activity tolerance, decreased balance, gait impairment, decreased safety awareness, respiratory status, decreased strength and transfers .  There are a many comorbidities or other factors that affect plan of care and require modification of task including: assistive device needed for mobility, chronic pain, oxygen dependency, has stairs to manage and home alone for a portion of the day.  Standardized tests and exams incorporated into evaluation include AMPAC mobility, balance, cognition/orientation, coordination, ROM  and Strength.  Pt demonstrates a stable clinical presentation.   Pt would continue to benefit from PT to address these deficits and increase functional independence.     Complexity Level Hx and Co  morbidites Examination Clinical Decision Making Clinical Presentation   Low no impact 1-2 elements Limited options Stable   Moderate   1-2 factors 3 or more   Several options Evolving, plan may alter   High 3 or more 4 or more Multiple options Unstable, unpredictable       Impairments: Assessment: Decreased LE strength;Decreased endurance/activity tolerance;Decreased functional mobility;Decreased balance;Gait impairment.     Therapy Diagnosis: generalized weakness, decreased functional mobility , decreased independence with ADL's, increased gait  dysfunction and decreased endurance/ activity engagement due to COPD Exacerbation.  Without therapy interventions, patient is at risk for falls, dependence on caregivers for mobility, dependence on caregivers for ADL's, decreased independence, failure to return to PLOF and decreased quality of life.    Rehabilitation Potential: Prognosis: Good;With continued PT status post acute discharge      Plan:    Treatment/Interventions: Exercise;Gait training;Stair training;Functional transfer training;Neuromuscular re-education;LE strengthening/ROM;Endurance training;Patient/family training;Equipment eval/education PT Frequency: 2-3x/wk    Risks/Benefits/POC Discussed with Pt/Family: With patient          Goals:   Goals  Goal Formulation: With patient  Time for Goal Acheivement: 3 visits  Goals: Select goal  Pt Will Perform Sit to Stand: independent;to maximize functional mobility and independence;5 visits  Pt Will Demo Standing Balance: 4+/5 indep, moves/returns center of grav in multiple planes 1-2";to maximize functional mobility and independence;7 visits  Pt Will Ambulate: > 200 feet;with single point cane;independent;to maximize functional mobility and independence;7 visits  Pt Will Go Up / Down Stairs: 1-2 stairs;modified independent;Without rail;With SPC;to maximize functional mobility and independence;7 visits  Pt Will Perform Home Exer Program: independent;to maximize functional mobility and independence;3 visits      Discharge Recommendations:   Based on today's session patient's discharge recommendation is the following: Discharge Recommendation: Home with home health PT     If Discharge Recommendation: Home with home health PT is not available, then the patient will need increase supervision , assistance with mobility and assistance with ADL's.         Precautions and Contraindications:   Precautions  Weight Bearing Status: no  restrictions  Other Precautions: SOB with activity, fall risk    Medical Diagnosis:  COPD exacerbation [J44.1]  Elevated lactic acid level [R79.89]  Other elevated white blood cell (WBC) count [D72.828]    History of Present Illness: Rachael [name] is a 81 y.o. female admitted on 01/06/2017 with the chief complaint of shortness of breath since one week ago.  Patient symptoms initially started with coughing, with productive cloudy mucus, congestion, fever and malaise/fatigue.  Patient then started having short of breath with wheezing, she was treated as outpatient with antibiotics and prednisone, which she completed yesterday, however, patient continued to have shortness of breath.  Patient does have a history of chronic obstructive pulmonary disease on chronic home oxygen at 2 L/m.  Patient states that her nephew and daughter were also sick as well.  Patient denies any recent hospitalization, denies any recent travel, or recent surgery. Patient denies any headache, lightheadedness, chills, chest pain, palpitation, nausea, vomiting, abdominal pain, diarrhea, blood in stool, melena, urinary symptoms, numbness or focal weakness.(from H&P)      Patient Active Problem List   Diagnosis   . COPD exacerbation   . Acute respiratory failure with hypoxia   . Lactic acidosis   . Acute bronchitis due to Rhinovirus   . Sepsis, due to unspecified organism   . Hypoxia   . Hyponatremia   . Leukocytosis   . Anxiety   . GERD (gastroesophageal reflux disease)   . HLD (hyperlipidemia)   . HTN (hypertension)   . Hypothyroid   . PMR (polymyalgia rheumatica)   . OSA (obstructive sleep apnea)   . Diabetes mellitus, type II        Past Medical/Surgical History:  Past Medical History:   Diagnosis Date   . Anxiety    . Arthritis    . 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 Chronic cholecystitis with calculus 10/31/2015   . Chronic obstructive pulmonary disease    . Gastroesophageal reflux disease    . Hiatal hernia    . Hyperlipidemia    . Hypertension    . Hypothyroidism    . Incontinent of urine 2014     -WEARS DIAPERS-WILL BRING HER OWN   . Low back pain    . Lymphedema of right lower extremity    . PMR (polymyalgia rheumatica) 01/07/2017   . Polymyalgia rheumatica 09/2014   . Sleep apnea     USES CPAP NIGHTLY   . Type 2 diabetes mellitus, controlled    . Type II or unspecified type diabetes mellitus without mention of complication, not stated as uncontrolled    . Venous insufficiency of right leg 2016    also has lymphedema-KEEPS HER LEGS WRAPPED EXCEPT FOR WHEN SHE BATHES      Past Surgical History:   Procedure Laterality Date   . BACK SURGERY  2014   . CARPAL TUNNEL RELEASE Bilateral 1978   . HYSTERECTOMY  1985   . ROBOTIC, CHOLECYSTECTOMY N/A 10/31/2015    Procedure: ROBOT ASSISTED, LAPAROSCOPIC, CHOLECYSTECTOMY, UMBILICAL HERNIA REPAIR;  Surgeon: Alen Blew, MD;  Location: [facility];  Service: General;  Laterality: N/A;         X-Rays/Tests/Labs:  POCT - Glucose Whole blood317 (H)mg/dL      Social History:  Prior Level of Function  Prior level of function: Independent with ADLs, Ambulates with assistive device (IND to Mod I for selfcare)  Assistive Device: Single point cane  Baseline Activity  Level: Community ambulation, Household ambulation  Driving: restricted (comment) (only during the day)  Cooking: No  DME Currently at Home: Single point cane, Front wheel walker, Home O2  Home Living Arrangements  Living Arrangements: Children  Type of Home: House  Home Layout: Multi-level  Bathroom Shower/Tub: Medical sales representative: Midwife: Grab bars in shower, Shower chair  DME Currently at Home: Single point cane, Front wheel walker, Home O2  Home Living - Notes / Comments: lives in basement with stair lift - daughter does cooking, shopping and chores      Subjective:    Patient is agreeable to participation in the therapy session. Nursing clears patient for therapy.   Patient Goal  Patient Goal: To improve walking with less pain  Pain Assessment  Pain Assessment:  Numeric Scale (0-10)  Pain Score: 6-moderate pain  POSS Score: Awake and Alert  Pain Location: Back  Pain Orientation: Lower  Pain Descriptors: Aching  Pain Frequency: Continuous    Objective:   Observation of Patient/Vital Signs:  Patient is in bed with telemetry and peripheral IV, O2 at 3 liters/minute via nasal cannula in place.       Cognition/Neuro Status  Arousal/Alertness: Appropriate responses to stimuli  Attention Span: Appears intact  Orientation Level: Oriented X4  Memory: Appears intact  Following Commands: Follows all commands and directions without difficulty  Safety Awareness: independent  Insights: Fully aware of deficits;Educated in safety awareness  Problem Solving: Assistance required to identify errors made;supervision  Behavior: attentive;calm;cooperative  Motor Planning: intact  Coordination: intact  Hand Dominance: right handed      Hearing: WNL  Vision: wears glasses  Sensation: WNL    Gross ROM  Neck/Trunk ROM: within functional limits  Right Upper Extremity ROM: within functional limits  Left Upper Extremity ROM: within functional limits  Right Lower Extremity ROM: within functional limits  Left Lower Extremity ROM: within functional limits  Gross Strength  Neck/Trunk Strength: 4/5  Right Upper Extremity Strength: 4/5  Left Upper Extremity Strength: 4/5  Right Lower Extremity Strength: 4/5  Left Lower Extremity Strength: 4/5  Tone  Tone: within functional limits    Functional Mobility  Supine to Sit: Modified Independent;Increased Time;Increased Effort;HOB raised;to Right  Scooting to EOB: Modified Independent;Increased Time;Increased Effort (SOB with activity)  Sit to Supine: Modified Independent  Sit to Stand: Stand by Assist;Increased Time;Increased Effort;bed elevated;with instruction for hand placement to increase safety  Stand to Sit: Stand by Assist     Locomotion  Ambulation: Stand by Assist;with front-wheeled walker  Pattern: decreased cadence;decreased step length;Step through  (SOB noted)  Distance Walked (ft) (Step 6,7): 75 Feet (with short rest break - breathing tech reviewed)  PMP Activity: Step 7 - Walks out of Room on 3 LO2 when ambulating    Balance  Balance: needs focused assessment  Sitting - Static: Good  Sitting - Dynamic: Good  Standing - Static: Good  Standing - Dynamic: Fair    Participation and Endurance  Participation Effort: good  Endurance: Tolerates 10 - 20 min exercise with multiple rests  [facility] Dyspnea Scale: 2+ Dyspnea (O2 saturation at 94% after ambulation)                   Treatment Activities: Pt educated to self assess for dizziness or unsteadiness during transitional movements - wait until symptoms subside before moving again, initiate ankle pumps if needed. Instructed patient in having initial supervision with high risk activities when first discharged  home such as stairs, showers, cooking, etc. Advised patient to have family assist with removing trip hazards from the environment such as throw rugs, furniture clutter, etc.   Discussed energy conservation use at home to gradually build up her tolerance for more activity.  Pt educated on pursed lip breathing when ambulation or during exertion with SOB noted.      Educated the patient to role of physical therapy, plan of care, goals of therapy and HEP, safety with mobility and ADLs, energy conservation techniques, pursed lip breathing, discharge instructions, home safety.    Rosamaria Lints MPT, CEAS

## 2017-01-09 NOTE — Progress Notes (Signed)
Pulmonary Progress Note   "[facility]"    Date Time: 01/09/17 7:40 PM  Patient Name: Rachael [name]  Attending Physician: Carmelia Bake, MD      Assessment:     Patient Active Problem List   Diagnosis   . COPD exacerbation: patient has clinically improved. The patient feels less winded and less bronchospastic. The patient has been ambulating but feeling mildly dyspneic. The cough has become less productive. Will wean IV steroids and gently diurese. Consider change to oral meds in next 24 hours. The patient may still have TBM.   Marland Kitchen GERD (gastroesophageal reflux disease)   . HLD (hyperlipidemia)   . HTN (hypertension)   . Hypothyroid   . PMR (polymyalgia rheumatica)   . OSA (obstructive sleep apnea): on CPAP @ 9 cm H2O   . Diabetes mellitus, type II       Plan:     See above      Interval History     Interval History/24 hr. Events: patient feels better    Physical Exam:   Vital signs for last 24 hours:  Temp:  [97 F (36.1 C)-98.2 F (36.8 C)] 98 F (36.7 C)  Heart Rate:  [62-99] 70  Resp Rate:  [18-24] 24  BP: (141-166)/(76-88) 144/80    Intake and Output Summary (Last 24 hours) at Date Time    Intake/Output Summary (Last 24 hours) at 01/09/17 1940  Last data filed at 01/09/17 1700   Gross per 24 hour   Intake              570 ml   Output              900 ml   Net             -330 ml       GENERAL: No acute distress.  HEENT: Neck supple, oropharynx clear, No lymphadenopathy.   LUNGS: Mild expiratory wheezing. CARDIOVASCULAR: Regular rate without audible murmurs.   ABDOMEN: Bowel sounds present. Soft, non-tender, no organomegaly noted.   EXTREMITIES: 1-2+ bilateral lower extremity nonpitting edema, no cyanosis or clubbing noted.   SKIN: No rashes.   NEURO: Unremarkable   MENTAL STATUS: Alert   LYMPHATIC: No enlarged nodes      Meds:   Scheduled Meds:  Current Facility-Administered Medications   Medication Dose Route Frequency   . acetylcysteine  1 mL Nebulization Q6H WA      . [START ON 01/10/2017] albuterol-ipratropium  3 mL Nebulization Q4H WA   . azithromycin  500 mg Intravenous Q24H   . cefTRIAXone  1 g Intravenous Q24H   . dilTIAZem  300 mg Oral Daily   . DULoxetine  60 mg Oral BID   . fluticasone furoate-vilanterol  1 puff Inhalation QAM   . folic acid  1 mg Oral Daily   . [START ON 01/10/2017] furosemide  20 mg Intravenous Once   . gabapentin  600 mg Oral BID   . guaiFENesin  600 mg Oral Q12H SCH   . insulin glargine  12 Units Subcutaneous Daily   . lactobacillus/streptococcus  1 capsule Oral Daily   . levothyroxine  50 mcg Oral Daily at 0600   . losartan  100 mg Oral Daily   . methylPREDNISolone  40 mg Intravenous BID   . metoprolol tartrate  25 mg Oral BID   . pantoprazole  40 mg Oral QAM AC   . polyethylene glycol  17 g Oral Daily  Continuous Infusions:  PRN Meds:.acetaminophen, albuterol-ipratropium, ALPRAZolam, Nursing communication: Adult Hypoglycemia Treatment Algorithm **AND** dextrose **AND** dextrose **AND** glucagon (rDNA), HYDROcodone-homatropine, insulin lispro, insulin lispro, lactulose, ondansetron, traMADol, traZODone      Labs:     Results     Procedure Component Value Units Date/Time    Glucose Whole Blood - POCT [[id number]]  (Abnormal) Collected:  01/09/17 1544     Updated:  01/09/17 1548     POCT - Glucose Whole blood 237 (H) mg/dL     Glucose Whole Blood - POCT [[id number]]  (Abnormal) Collected:  01/09/17 1108     Updated:  01/09/17 1115     POCT - Glucose Whole blood 294 (H) mg/dL     Glucose Whole Blood - POCT [[id number]]  (Abnormal) Collected:  01/09/17 0701     Updated:  01/09/17 0715     POCT - Glucose Whole blood 207 (H) mg/dL     CBC and differential [[id number]]  (Abnormal) Collected:  01/09/17 0448    Specimen:  Blood from Blood Updated:  01/09/17 0629     WBC 12.25 (H) x10 3/uL      Hgb 12.6 g/dL      Hematocrit 62.9 %      Platelets 302 x10 3/uL      RBC 4.57 x10 6/uL      MCV 84.7 fL      MCH 27.6 (L) pg      MCHC 32.6 g/dL      RDW 15 %       MPV 9.2 (L) fL      Neutrophils 83.1 %      Lymphocytes Automated 9.2 %      Monocytes 5.1 %      Eosinophils Automated 0.0 %      Basophils Automated 0.2 %      Immature Granulocyte 2.4 %      Nucleated RBC 0.0 /100 WBC      Neutrophils Absolute 10.17 (H) x10 3/uL      Abs Lymph Automated 1.13 x10 3/uL      Abs Mono Automated 0.62 x10 3/uL      Abs Eos Automated 0.00 x10 3/uL      Absolute Baso Automated 0.03 x10 3/uL      Absolute Immature Granulocyte 0.30 (H) x10 3/uL      Absolute NRBC 0.00 x10 3/uL     CULTURE BLOOD AEROBIC AND ANAEROBIC #1 [[id number]] Collected:  01/06/17 2250    Specimen:  Arm from Blood, Intravenous Line Updated:  01/09/17 0321    Narrative:       ORDER#: [id number]                                    ORDERED BY: Reine Just  SOURCE: Blood, Intravenous Line Peripheral, Left ac  COLLECTED:  01/06/17 22:50  ANTIBIOTICS AT COLL.:                                RECEIVED :  01/07/17 02:36  Culture Blood Aerobic and Anaerobic        PRELIM      01/09/17 03:21  01/08/17   No Growth after 1 day/s of incubation.  01/09/17   No Growth after 2 day/s of incubation.      CULTURE BLOOD AEROBIC AND ANAEROBIC #2 [[id number]] Collected:  01/06/17 2151  Specimen:  Arm from Blood, Venipuncture Updated:  01/09/17 0021    Narrative:       ORDER#: [id number]                                    ORDERED BY: Reine Just  SOURCE: Blood, Venipuncture lac                      COLLECTED:  01/06/17 21:51  ANTIBIOTICS AT COLL.:                                RECEIVED :  01/06/17 23:38  Culture Blood Aerobic and Anaerobic        PRELIM      01/09/17 00:21  01/08/17   No Growth after 1 day/s of incubation.  01/09/17   No Growth after 2 day/s of incubation.      Glucose Whole Blood - POCT [[id number]]  (Abnormal) Collected:  01/08/17 2110     Updated:  01/08/17 2322     POCT - Glucose Whole blood 317 (H) mg/dL             Radiology:     Radiology Results (24 Hour)     ** No results found for the last 24 hours. **             Signed by: Elvera Lennox, MD  Date/Time: 01/09/2017 7:40 PM

## 2017-01-10 LAB — GLUCOSE WHOLE BLOOD - POCT
Whole Blood Glucose POCT: 303 mg/dL — ABNORMAL HIGH (ref 70–100)
Whole Blood Glucose POCT: 305 mg/dL — ABNORMAL HIGH (ref 70–100)
Whole Blood Glucose POCT: 409 mg/dL — ABNORMAL HIGH (ref 70–100)
Whole Blood Glucose POCT: 366 mg/dL — ABNORMAL HIGH (ref 70–100)
Whole Blood Glucose POCT: 305 mg/dL — ABNORMAL HIGH (ref 70–100)

## 2017-01-10 MED ORDER — INSULIN GLARGINE 100 UNIT/ML SC SOLN
15.0000 [IU] | Freq: Every day | SUBCUTANEOUS | Status: DC
Start: 2017-01-11 — End: 2017-01-12
  Administered 2017-01-11 – 2017-01-12 (×2): 15 [IU] via SUBCUTANEOUS
  Filled 2017-01-10 (×2): qty 15

## 2017-01-10 MED ORDER — INSULIN LISPRO 100 UNIT/ML SC SOLN
7.0000 [IU] | Freq: Three times a day (TID) | SUBCUTANEOUS | Status: DC
Start: 2017-01-10 — End: 2017-01-12
  Administered 2017-01-10 – 2017-01-12 (×6): 7 [IU] via SUBCUTANEOUS
  Filled 2017-01-10 (×6): qty 21

## 2017-01-10 NOTE — Plan of Care (Signed)
Problem: Compromised Hemodynamic Status  Goal: Vital signs and fluid balance maintained/improved  Outcome: Progressing   01/10/17 2049   Goal/Interventions addressed this shift   Vital signs and fluid balance are maintained/improved Position patient for maximum circulation/cardiac output;Monitor/assess vitals and hemodynamic parameters with position changes;Monitor and compare daily weight;Monitor/assess lab values and report abnormal values;Monitor intake and output. Notify LIP if urine output is less than 30 mL/hour.       Problem: Inadequate Gas Exchange  Goal: Adequate oxygenation and improved ventilation  Outcome: Progressing   01/10/17 2049   Goal/Interventions addressed this shift   Adequate oxygenation and improved ventilation Assess lung sounds;Monitor SpO2 and treat as needed;Provide mechanical and oxygen support to facilitate gas exchange;Position for maximum ventilatory efficiency;Teach/reinforce use of incentive spirometer 10 times per hour while awake, cough and deep breath as needed;Plan activities to conserve energy: plan rest periods;Increase activity as tolerated/progressive mobility       Problem: Inadequate Airway Clearance  Goal: Normal respiratory rate/effort achieved/maintained  Outcome: Progressing   01/10/17 2049   Goal/Interventions addressed this shift   Normal respiratory rate/effort achieved/maintained Plan activities to conserve energy: plan rest periods       Problem: Pain  Goal: Pain at adequate level as identified by patient  Outcome: Progressing   01/10/17 2049   Goal/Interventions addressed this shift   Pain at adequate level as identified by patient Identify patient comfort function goal;Reassess pain within 30-60 minutes of any procedure/intervention, per Pain Assessment, Intervention, Reassessment (AIR) Cycle;Assess pain on admission, during daily assessment and/or before any "as needed" intervention(s);Evaluate if patient comfort function goal is met;Evaluate patient's  satisfaction with pain management progress;Offer non-pharmacological pain management interventions

## 2017-01-10 NOTE — Plan of Care (Signed)
Problem: Moderate/High Fall Risk Score >5  Goal: Patient will remain free of falls   01/10/17 1643   OTHER   High (Greater than 13) HIGH-([facility] Only) Plan with CNA for priority hourly rounding;HIGH-([facility] Only) Keep door open for better visability;HIGH-Apply yellow "Fall Risk" arm band;HIGH-Activate bed/chair exit alarm where available;MOD-Place Fall Risk level on whiteboard in room;MOD-Move patient to High risk if Heparin infusion, Coumadin, Lovenox >40mg /dose;MOD-Include family in multidisciplinary POC discussions;MOD-Use of assistive devices -Bedside Commode if appropriate;MOD-Use of chair-pad alarm when appropriate   Hourly rounding continued. Call bell at bedside. No falls. No injury.    Problem: Pain  Goal: Pain at adequate level as identified by patient  Outcome: Progressing   01/09/17 2341   Goal/Interventions addressed this shift   Pain at adequate level as identified by patient Identify patient comfort function goal;Assess for risk of opioid induced respiratory depression, including snoring/sleep apnea. Alert healthcare team of risk factors identified.;Assess pain on admission, during daily assessment and/or before any "as needed" intervention(s);Evaluate if patient comfort function goal is met;Offer non-pharmacological pain management interventions;Evaluate patient's satisfaction with pain management progress   Pain assessed on hourly rounding.    Problem: Psychosocial and Spiritual Needs  Goal: Demonstrates ability to cope with hospitalization/illness  Outcome: Progressing  RN offering emotional support and reassurance.

## 2017-01-10 NOTE — Progress Notes (Addendum)
INTERNAL MEDICINE  PROGRESS NOTE     Patient: Rachael [name]  Date: 01/10/2017   LOS: 3 Days  Admission Date: 01/06/2017   MRN: [id number]  Attending: Carmelia Bake, MD       SUBJECTIVE     C.C continues to have cough but dyspnea has improved. No fevers.   WBC 12.25  MEDICATIONS     Current Facility-Administered Medications   Medication Dose Route Frequency   . albuterol-ipratropium  3 mL Nebulization Q4H WA   . azithromycin  500 mg Intravenous Q24H   . cefTRIAXone  1 g Intravenous Q24H   . dilTIAZem  300 mg Oral Daily   . DULoxetine  60 mg Oral BID   . fluticasone furoate-vilanterol  1 puff Inhalation QAM   . folic acid  1 mg Oral Daily   . gabapentin  600 mg Oral BID   . guaiFENesin  600 mg Oral Q12H SCH   . [START ON 01/11/2017] insulin glargine  15 Units Subcutaneous Daily   . lactobacillus/streptococcus  1 capsule Oral Daily   . levothyroxine  50 mcg Oral Daily at 0600   . losartan  100 mg Oral Daily   . methylPREDNISolone  40 mg Intravenous BID   . metoprolol tartrate  25 mg Oral BID   . pantoprazole  40 mg Oral QAM AC   . polyethylene glycol  17 g Oral Daily         ROS   Cough and dyspnea  No fever or chills  +weakness and rib pain      PHYSICAL EXAM     Objective:  I/O last 3 completed shifts:  In: 570 [P.O.:520; IV Piggyback:50]  Out: 1200 [Urine:1200]     Vitals:    01/10/17 0517 01/10/17 0909 01/10/17 1126 01/10/17 1254   BP: (!) 170/99 159/83  117/67   Pulse: 68 100 81 83   Resp: 22 20 22 22    Temp: 98.6 F (37 C) 98.1 F (36.7 C)  98.1 F (36.7 C)   TempSrc: Temporal Artery      SpO2: (!) 89% 93% 92% 92%   Weight: 82.6 kg (182 lb)      Height:           Exam: Patient is awake, alert and oriented x 3, in no acute distress  HEENT: PERRLA, EOMI, Oral cavity well hydrated.  Neck: supple, without JVD  Chest: diminished breath sounds with rhonchi bilaterally. No crackles   CVS: S1, S2 normal, no rubs, no murmurs, no gallops.  Regular rate and rhythm.  Abdomen: soft and non tender with good bowel  sounds.  Musculoskeletal: No pitting edema, has chronic lymphedema no cyanosis/clubbing  NEURO:  No Focal neurological deficits      LABS       Recent Labs  Lab 01/09/17  0448 01/07/17  0511 01/06/17  2151   WBC 12.25* 13.50* 13.99*   RBC 4.57 4.39 4.67   Hgb 12.6 12.2 13.0   Hematocrit 38.7 37.9 39.5   MCV 84.7 86.3 84.6   Platelets 302 285 322       Recent Labs  Lab 01/07/17  0511 01/06/17  2151   Sodium 137 132*   Potassium 4.1 4.2   Chloride 102 97*   CO2 22 24   BUN 14.2 15.1   Creatinine 0.9 0.9   Glucose 324* 277*   Calcium 8.2 9.0   Magnesium 1.8  --  Invalid input(s): OSMOL    Recent Labs  Lab 01/07/17  0511 01/06/17  2151   ALT 14 15   AST (SGOT) 8 11   Bilirubin, Total 0.1* 0.3   Albumin 3.2* 3.5   Alkaline Phosphatase 59 62             (!) 409  Invalid input(s): BLOODCULTURE      RADIOLOGY     Radiological Procedure reviewed.  Chest AP Portable   Final Result    No acute abnormal findings. No evidence of pneumonia.      Wynema Birch, MD    01/06/2017 9:49 PM            Patient Active Problem List    Diagnosis Date Noted   . Hypoxia 01/07/2017   . Hyponatremia 01/07/2017   . Leukocytosis 01/07/2017   . Anxiety 01/07/2017   . GERD (gastroesophageal reflux disease) 01/07/2017   . HLD (hyperlipidemia) 01/07/2017   . HTN (hypertension) 01/07/2017   . Hypothyroid 01/07/2017   . PMR (polymyalgia rheumatica) 01/07/2017   . OSA (obstructive sleep apnea) 01/07/2017   . Diabetes mellitus, type II 01/07/2017   . Sepsis, due to unspecified organism    . Acute bronchitis due to Rhinovirus 03/29/2016   . COPD exacerbation 03/25/2016   . Acute respiratory failure with hypoxia 03/25/2016   . Lactic acidosis 03/25/2016       Assessment and Plan:    Sepsis with Lactic acidosis. Resolving Continue IV Ceftriaxone and Zithromax and follow cultures   COPD exacerbation. Taper IV Steroids, Duonebs scheduled and IV antibiotics. Pulmonary consulted. PRN antitussives, wean off Oxygen during day time as long as  saturations > 90%   OSA. CPAP at HS ordered   Hypertension/ tachycardia stable. Increased Diltiazem to 300 mg daily   Type 2 DM, Uncontrolled on Lantus Insulin and SSI. Increase Lantus to 15 units daily. Add pre-meal Humalog TID AC   PMR on Cymbalta 60 mg BID   Nocturnal Hypoxia on nighttime Oxygen   Hyperlipidemia   Chronic back pain on PRN Tramadol    Request PT/OT evaluation      Estimate length of stay 1 MORE day   Code status: Full   Medical necessity for stay  Carmelia Bake, MD  01/10/2017 3:10 PM  Time spent for evaluation and management and coordination of care

## 2017-01-11 LAB — CBC AND DIFFERENTIAL
Absolute NRBC: 0 10*3/uL
Basophils Absolute Automated: 0.02 10*3/uL (ref 0.00–0.20)
Basophils Automated: 0.2 %
Eosinophils Absolute Automated: 0 10*3/uL (ref 0.00–0.70)
Eosinophils Automated: 0 %
Hematocrit: 37.2 % (ref 37.0–47.0)
Hgb: 12 g/dL (ref 12.0–16.0)
Immature Granulocytes Absolute: 0.21 10*3/uL — ABNORMAL HIGH
Immature Granulocytes: 2 %
Lymphocytes Absolute Automated: 0.71 10*3/uL (ref 0.50–4.40)
Lymphocytes Automated: 6.8 %
MCH: 27.8 pg — ABNORMAL LOW (ref 28.0–32.0)
MCHC: 32.3 g/dL (ref 32.0–36.0)
MCV: 86.3 fL (ref 80.0–100.0)
MPV: 9.5 fL (ref 9.4–12.3)
Monocytes Absolute Automated: 0.37 10*3/uL (ref 0.00–1.20)
Monocytes: 3.5 %
Neutrophils Absolute: 9.2 10*3/uL — ABNORMAL HIGH (ref 1.80–8.10)
Neutrophils: 87.5 %
Nucleated RBC: 0 /100 WBC (ref 0.0–1.0)
Platelets: 270 10*3/uL (ref 140–400)
RBC: 4.31 10*6/uL (ref 4.20–5.40)
RDW: 15 % (ref 12–15)
WBC: 10.51 10*3/uL (ref 3.50–10.80)

## 2017-01-11 LAB — GLUCOSE WHOLE BLOOD - POCT
Whole Blood Glucose POCT: 258 mg/dL — ABNORMAL HIGH (ref 70–100)
Whole Blood Glucose POCT: 315 mg/dL — ABNORMAL HIGH (ref 70–100)
Whole Blood Glucose POCT: 284 mg/dL — ABNORMAL HIGH (ref 70–100)
Whole Blood Glucose POCT: 344 mg/dL — ABNORMAL HIGH (ref 70–100)

## 2017-01-11 LAB — MAGNESIUM: Magnesium: 2.3 mg/dL (ref 1.6–2.6)

## 2017-01-11 LAB — PHOSPHORUS: Phosphorus: 3.4 mg/dL (ref 2.3–4.7)

## 2017-01-11 LAB — BASIC METABOLIC PANEL
Anion Gap: 10 (ref 5.0–15.0)
BUN: 20 mg/dL — ABNORMAL HIGH (ref 7.0–19.0)
CO2: 31 mEq/L — ABNORMAL HIGH (ref 22–29)
Calcium: 8.8 mg/dL (ref 7.9–10.2)
Chloride: 97 mEq/L — ABNORMAL LOW (ref 100–111)
Creatinine: 0.8 mg/dL (ref 0.6–1.0)
Glucose: 306 mg/dL — ABNORMAL HIGH (ref 70–100)
Potassium: 3.6 mEq/L (ref 3.5–5.1)
Sodium: 138 mEq/L (ref 136–145)

## 2017-01-11 LAB — GFR: EGFR: >60

## 2017-01-11 MED ORDER — DILTIAZEM HCL ER COATED BEADS 300 MG PO CP24
300.0000 mg | ORAL_CAPSULE | Freq: Every day | ORAL | 1 refills | Status: DC
Start: 2017-01-12 — End: 2018-04-21

## 2017-01-11 MED ORDER — ALBUTEROL-IPRATROPIUM 2.5-0.5 (3) MG/3ML IN SOLN
3.0000 mL | RESPIRATORY_TRACT | 3 refills | Status: DC
Start: 2017-01-11 — End: 2017-02-08

## 2017-01-11 MED ORDER — PREDNISONE 5 MG PO TABS
ORAL_TABLET | ORAL | 0 refills | Status: DC
Start: 2017-01-11 — End: 2017-01-12

## 2017-01-11 MED ORDER — METHYLPREDNISOLONE SODIUM SUCC 40 MG IJ SOLR
30.0000 mg | Freq: Two times a day (BID) | INTRAMUSCULAR | Status: DC
Start: 2017-01-11 — End: 2017-01-12
  Administered 2017-01-11 – 2017-01-12 (×2): 30 mg via INTRAVENOUS
  Filled 2017-01-11 (×2): qty 1

## 2017-01-11 NOTE — Progress Notes (Signed)
Pulmonary Progress Note  "[facility]"    Date Time: 01/11/17 9:59 AM  Patient Name: Rachael [name]  Attending Physician: Carmelia Bake, MD      Assessment:     Patient Active Problem List   Diagnosis   . COPD exacerbation   . Acute respiratory failure with hypoxia   . Lactic acidosis   . Acute bronchitis due to Rhinovirus   . Sepsis, due to unspecified organism   . Hypoxia   . Hyponatremia   . Leukocytosis   . Anxiety   . GERD (gastroesophageal reflux disease)   . HLD (hyperlipidemia)   . HTN (hypertension)   . Hypothyroid   . PMR (polymyalgia rheumatica)   . OSA (obstructive sleep apnea)   . Diabetes mellitus, type II           Plan:   Patient can be switched to by mouth prednisone and antibiotics in preparation for discharge.  She will  follow-up with Dr.Naghshin in the office  Continue slow prednisone taper to 10 mg daily.  She is on Spiriva and Symbicort and Spiriva at home which should be continued.      Interval History   [id number] is subjectively better and anxious to be going home today.  She still has some cough and chest congestion but significantly better since admission.      Interval History/24 hr. Events:none    Physical Exam:   Vital signs for last 24 hours:  Temp:  [98.1 F (36.7 C)-98.6 F (37 C)] 98.6 F (37 C)  Heart Rate:  [72-107] 107  Resp Rate:  [20-22] 20  BP: (117-163)/(67-91) 163/91    Intake and Output Summary (Last 24 hours) at Date Time    Intake/Output Summary (Last 24 hours) at 01/11/17 0959  Last data filed at 01/11/17 0426   Gross per 24 hour   Intake             1120 ml   Output             1300 ml   Net             -180 ml       General: awake, alert, oriented x 3; no acute distress; able to speak in complete sentences without accessory muscle use; in   HEENT: pupils equal with EOMI; no thyromegaly, no cervical LN, no thrush.  Cardiovascular: regular rate and rhythm, no murmurs, rubs or gallops. No P2/S2 present. No R sided heave. No JVD, no  HJR.  Lungs: there are bilateral rhonchi and few rales.  Abdomen: soft, non-tender, non-distended; no palpable masses, no hepatosplenomegaly, normoactive bowel sounds, no rebound or guarding  Extremities: no clubbing, cyanosis, or edema.        Meds:   Scheduled Meds:  Current Facility-Administered Medications   Medication Dose Route Frequency   . albuterol-ipratropium  3 mL Nebulization Q4H WA   . azithromycin  500 mg Intravenous Q24H   . cefTRIAXone  1 g Intravenous Q24H   . dilTIAZem  300 mg Oral Daily   . DULoxetine  60 mg Oral BID   . fluticasone furoate-vilanterol  1 puff Inhalation QAM   . folic acid  1 mg Oral Daily   . gabapentin  600 mg Oral BID   . guaiFENesin  600 mg Oral Q12H SCH   . insulin glargine  15 Units Subcutaneous Daily   . insulin lispro  7 Units Subcutaneous TID AC   . lactobacillus/streptococcus  1 capsule Oral Daily   . levothyroxine  50 mcg Oral Daily at 0600   . losartan  100 mg Oral Daily   . methylPREDNISolone  30 mg Intravenous BID   . metoprolol tartrate  25 mg Oral BID   . pantoprazole  40 mg Oral QAM AC   . polyethylene glycol  17 g Oral Daily     Continuous Infusions:  PRN Meds:.acetaminophen, albuterol-ipratropium, ALPRAZolam, Nursing communication: Adult Hypoglycemia Treatment Algorithm **AND** dextrose **AND** dextrose **AND** glucagon (rDNA), HYDROcodone-homatropine, insulin lispro, insulin lispro, lactulose, ondansetron, traMADol, traZODone      Labs:     Results     Procedure Component Value Units Date/Time    Glucose Whole Blood - POCT [[id number]]  (Abnormal) Collected:  01/11/17 0730     Updated:  01/11/17 0733     POCT - Glucose Whole blood 258 (H) mg/dL     Phosphorus [[id number]] Collected:  01/11/17 0428    Specimen:  Blood Updated:  01/11/17 0655     Phosphorus 3.4 mg/dL     Basic Metabolic Panel [[id number]]  (Abnormal) Collected:  01/11/17 0428    Specimen:  Blood Updated:  01/11/17 0655     Glucose 306 (H) mg/dL      BUN 08.6 (H) mg/dL      Creatinine 0.8 mg/dL       Calcium 8.8 mg/dL      Sodium 578 mEq/L      Potassium 3.6 mEq/L      Chloride 97 (L) mEq/L      CO2 31 (H) mEq/L      Anion Gap 10.0    GFR [[id number]] Collected:  01/11/17 0428     Updated:  01/11/17 0655     EGFR >60.0    Magnesium [[id number]] Collected:  01/11/17 0428    Specimen:  Blood Updated:  01/11/17 0655     Magnesium 2.3 mg/dL     CBC and differential [[id number]]  (Abnormal) Collected:  01/11/17 0428    Specimen:  Blood from Blood Updated:  01/11/17 0638     WBC 10.51 x10 3/uL      Hgb 12.0 g/dL      Hematocrit 03.4 %      Platelets 270 x10 3/uL      RBC 4.31 x10 6/uL      MCV 86.3 fL      MCH 27.8 (L) pg      MCHC 32.3 g/dL      RDW 15 %      MPV 9.5 fL      Neutrophils 87.5 %      Lymphocytes Automated 6.8 %      Monocytes 3.5 %      Eosinophils Automated 0.0 %      Basophils Automated 0.2 %      Immature Granulocyte 2.0 %      Nucleated RBC 0.0 /100 WBC      Neutrophils Absolute 9.20 (H) x10 3/uL      Abs Lymph Automated 0.71 x10 3/uL      Abs Mono Automated 0.37 x10 3/uL      Abs Eos Automated 0.00 x10 3/uL      Absolute Baso Automated 0.02 x10 3/uL      Absolute Immature Granulocyte 0.21 (H) x10 3/uL      Absolute NRBC 0.00 x10 3/uL     CULTURE BLOOD AEROBIC AND ANAEROBIC #1 [[id number]] Collected:  01/06/17 2250    Specimen:  Arm from Blood, Intravenous Line  Updated:  01/11/17 0321    Narrative:       ORDER#: [id number]                                    ORDERED BY: Reine Just  SOURCE: Blood, Intravenous Line Peripheral, Left ac  COLLECTED:  01/06/17 22:50  ANTIBIOTICS AT COLL.:                                RECEIVED :  01/07/17 02:36  Culture Blood Aerobic and Anaerobic        PRELIM      01/11/17 03:21  01/08/17   No Growth after 1 day/s of incubation.  01/09/17   No Growth after 2 day/s of incubation.  01/10/17   No Growth after 3 day/s of incubation.  01/11/17   No Growth after 4 day/s of incubation.      CULTURE BLOOD AEROBIC AND ANAEROBIC #2 [[id number]] Collected:  01/06/17 2151     Specimen:  Arm from Blood, Venipuncture Updated:  01/11/17 0022    Narrative:       ORDER#: [id number]                                    ORDERED BY: Reine Just  SOURCE: Blood, Venipuncture lac                      COLLECTED:  01/06/17 21:51  ANTIBIOTICS AT COLL.:                                RECEIVED :  01/06/17 23:38  Culture Blood Aerobic and Anaerobic        PRELIM      01/11/17 00:21  01/08/17   No Growth after 1 day/s of incubation.  01/09/17   No Growth after 2 day/s of incubation.  01/10/17   No Growth after 3 day/s of incubation.  01/11/17   No Growth after 4 day/s of incubation.      Glucose Whole Blood - POCT [[id number]]  (Abnormal) Collected:  01/10/17 2045     Updated:  01/10/17 2121     POCT - Glucose Whole blood 305 (H) mg/dL     Glucose Whole Blood - POCT [[id number]]  (Abnormal) Collected:  01/10/17 1602     Updated:  01/10/17 1609     POCT - Glucose Whole blood 366 (H) mg/dL     Glucose Whole Blood - POCT [[id number]]  (Abnormal) Collected:  01/10/17 1120     Updated:  01/10/17 1126     POCT - Glucose Whole blood 409 (H) mg/dL             Radiology:     Radiology Results (24 Hour)     ** No results found for the last 24 hours. **            Signed by: Kendra Opitz, MD  Date/Time: 01/11/2017 9:59 AM

## 2017-01-11 NOTE — Progress Notes (Signed)
Chaplaincy Services volunteer provided spiritual care through visitation.  Chaplain available for additional support if needed/desired.    Koi Zangara, BCC, MDiv, DMin   Chaplaincy Manager   [facility]  Chaplaincy Services  [phone]  **Page Chaplain 24/7 at [phone]**

## 2017-01-11 NOTE — Progress Notes (Signed)
INTERNAL MEDICINE  PROGRESS NOTE     Patient: Rachael [name]  Date: 01/11/2017   LOS: 4 Days  Admission Date: 01/06/2017   MRN: [id number]  Attending: Carmelia Bake, MD       SUBJECTIVE     C.C continues to have cough and wheezing. No fevers.. Has redness on face and legs  WBC 10.51  MEDICATIONS     Current Facility-Administered Medications   Medication Dose Route Frequency   . albuterol-ipratropium  3 mL Nebulization Q4H WA   . azithromycin  500 mg Intravenous Q24H   . cefTRIAXone  1 g Intravenous Q24H   . dilTIAZem  300 mg Oral Daily   . DULoxetine  60 mg Oral BID   . fluticasone furoate-vilanterol  1 puff Inhalation QAM   . folic acid  1 mg Oral Daily   . gabapentin  600 mg Oral BID   . guaiFENesin  600 mg Oral Q12H SCH   . insulin glargine  15 Units Subcutaneous Daily   . insulin lispro  7 Units Subcutaneous TID AC   . lactobacillus/streptococcus  1 capsule Oral Daily   . levothyroxine  50 mcg Oral Daily at 0600   . losartan  100 mg Oral Daily   . methylPREDNISolone  30 mg Intravenous BID   . metoprolol tartrate  25 mg Oral BID   . pantoprazole  40 mg Oral QAM AC   . polyethylene glycol  17 g Oral Daily         ROS   Cough and dyspnea  No fever or chills  +weakness and rib pain      PHYSICAL EXAM     Objective:  I/O last 3 completed shifts:  In: 1360 [P.O.:1360]  Out: 2300 [Urine:2300]     Vitals:    01/11/17 0717 01/11/17 0945 01/11/17 1315 01/11/17 1331   BP:  (!) 163/91 152/82    Pulse: 72 (!) 107 90 89   Resp: 20 20 22     Temp:  98.6 F (37 C) 97.9 F (36.6 C)    TempSrc:       SpO2:  92% (!) 88% 92%   Weight:       Height:           Exam: Patient is awake, alert and oriented x 3, in no acute distress  HEENT: PERRLA, EOMI, Oral cavity well hydrated.  Neck: supple, without JVD  Chest: diminished breath sounds with rhonchi bilaterally. No crackles   CVS: S1, S2 normal, no rubs, no murmurs, no gallops.  Regular rate and rhythm.  Abdomen: soft and non tender with good bowel sounds.  Musculoskeletal: No  pitting edema, has chronic lymphedema no cyanosis/clubbing  NEURO:  No Focal neurological deficits      LABS       Recent Labs  Lab 01/11/17  0428 01/09/17  0448 01/07/17  0511 01/06/17  2151   WBC 10.51 12.25* 13.50* 13.99*   RBC 4.31 4.57 4.39 4.67   Hgb 12.0 12.6 12.2 13.0   Hematocrit 37.2 38.7 37.9 39.5   MCV 86.3 84.7 86.3 84.6   Platelets 270 302 285 322       Recent Labs  Lab 01/11/17  0428 01/07/17  0511 01/06/17  2151   Sodium 138 137 132*   Potassium 3.6 4.1 4.2   Chloride 97* 102 97*   CO2 31* 22 24   BUN 20.0* 14.2 15.1   Creatinine 0.8 0.9 0.9   Glucose 306* 324*  277*   Calcium 8.8 8.2 9.0   Magnesium 2.3 1.8  --            Invalid input(s): OSMOL    Recent Labs  Lab 01/07/17  0511 01/06/17  2151   ALT 14 15   AST (SGOT) 8 11   Bilirubin, Total 0.1* 0.3   Albumin 3.2* 3.5   Alkaline Phosphatase 59 62             (!) 315  Invalid input(s): BLOODCULTURE      RADIOLOGY     Radiological Procedure reviewed.  Chest AP Portable   Final Result    No acute abnormal findings. No evidence of pneumonia.      Wynema Birch, MD    01/06/2017 9:49 PM            Patient Active Problem List    Diagnosis Date Noted   . Hypoxia 01/07/2017   . Hyponatremia 01/07/2017   . Leukocytosis 01/07/2017   . Anxiety 01/07/2017   . GERD (gastroesophageal reflux disease) 01/07/2017   . HLD (hyperlipidemia) 01/07/2017   . HTN (hypertension) 01/07/2017   . Hypothyroid 01/07/2017   . PMR (polymyalgia rheumatica) 01/07/2017   . OSA (obstructive sleep apnea) 01/07/2017   . Diabetes mellitus, type II 01/07/2017   . Sepsis, due to unspecified organism    . Acute bronchitis due to Rhinovirus 03/29/2016   . COPD exacerbation 03/25/2016   . Acute respiratory failure with hypoxia 03/25/2016   . Lactic acidosis 03/25/2016       Assessment and Plan:    Sepsis with Lactic acidosis. Resolving Continue IV Ceftriaxone and Zithromax and follow cultures   COPD exacerbation with ongoing wheezing. continue IV Steroids, Duonebs scheduled and IV  antibiotics. Pulmonary consulted and discussed with Dr Romie Minus. PRN antitussives, wean off Oxygen during day time as long as saturations > 90%   OSA. CPAP at HS ordered   Hypertension/ tachycardia stable. Increased Diltiazem to 300 mg daily   Type 2 DM, Uncontrolled on Lantus Insulin and SSI. Increase Lantus to 15 units daily. Add pre-meal Humalog TID AC   PMR on Cymbalta 60 mg BID   Nocturnal Hypoxia on nighttime Oxygen   Hyperlipidemia   Chronic back pain on PRN Tramadol    Request PT/OT evaluation      Estimate length of stay 1 MORE day   Code status: Full   Medical necessity for stay  Carmelia Bake, MD  01/11/2017 3:09 PM  Time spent for evaluation and management and coordination of care

## 2017-01-11 NOTE — Discharge Summary (Signed)
Internal Medicine Discharge summary      Patient: Rachael [name]  Admission Date: 01/06/2017   DOB: 09-13-1936  Discharge Date: 01/12/2017    MRN: [id number]  Discharge Attending: Carmelia Bake, MD   Referring Physician: Carmelia Bake, MD  PCP: Carmelia Bake, MD       DISCHARGE SUMMARY     Discharge Information   Discharge Diagnoses:    Sepsis with Lactic acidosis. Resolving with IV Ceftriaxone and Zithromax and negative cultures. Discharge on PO Ceftin for 7 days     COPD exacerbation improved. Discharge on Prednisone Taper , Duonebs scheduled. Pulmonary consulted. PRN antitussives, Arrange for Home Oxygen during the day time   OSA. CPAP at HS ordered   Hypertension/ tachycardia resolved after Increasing Diltiazem to 300 mg daily   Type 2 DM, Uncontrolled on Lantus Insulin and SSI. Increase Lantus to 15 units daily. Add pre-meal Humalog TID AC   PMR on Cymbalta 60 mg BID   Nocturnal Hypoxia on nighttime Oxygen. Advised to use Oxygen 24/7   Hyperlipidemia   Chronic back pain on PRN Tramadol    Discharge Medications:     Medication List      START taking these medications    albuterol-ipratropium 2.5-0.5(3) mg/3 mL nebulizer  Commonly known as:  DUO-NEB  Take 3 mLs by nebulization every 4 (four) hours.  Replaces:  albuterol (2.5 MG/3ML) 0.083% nebulizer solution     cefuroxime 250 MG tablet  Commonly known as:  CEFTIN  Take 1 tablet (250 mg total) by mouth 2 (two) times daily.        CHANGE how you take these medications    dilTIAZem 300 MG 24 hr capsule  Commonly known as:  CARDIZEM CD  Take 1 capsule (300 mg total) by mouth daily.  What changed:   medication strength   how much to take     guaiFENesin 600 MG 12 hr tablet  Commonly known as:  MUCINEX  Take 1 tablet (600 mg total) by mouth every 12 (twelve) hours.  What changed:  when to take this     predniSONE 20 MG tablet  Commonly known as:  DELTASONE  2 tabs for 4 days, 1 tab for 4 days, 0.5 tab for 4 days  What changed:   medication  strength   how much to take   how to take this   when to take this   additional instructions        CONTINUE taking these medications    DULoxetine 60 MG capsule  Commonly known as:  CYMBALTA     gabapentin 300 MG capsule  Commonly known as:  NEURONTIN     hydrALAZINE 25 MG tablet  Commonly known as:  APRESOLINE     ipratropium 0.02 % nebulizer solution  Commonly known as:  ATROVENT     lactobacillus/streptococcus Caps  Take 1 capsule by mouth daily.     lactulose 10 GM/15ML solution  Commonly known as:  CHRONULAC  Take 15 mLs (10 g total) by mouth daily as needed (Constipation).     levothyroxine 50 MCG tablet  Commonly known as:  SYNTHROID, LEVOTHROID     LIRAGLUTIDE SC     LIVALO 2 MG Tabs  Generic drug:  Pitavastatin Calcium     losartan-hydrochlorothiazide 100-25 MG per tablet  Commonly known as:  HYZAAR     metFORMIN 750 MG 24 hr tablet  Commonly known as:  GLUCOPHAGE-XR     metoprolol succinate XL 25 MG 24 hr  tablet  Commonly known as:  TOPROL-XL     pantoprazole 40 MG tablet  Commonly known as:  PROTONIX     polyethylene glycol packet  Commonly known as:  MIRALAX  Take 17 g by mouth daily as needed (Constipation).     potassium chloride 10 MEQ tablet  Commonly known as:  K-TAB,KLOR-CON     SYMBICORT 160-4.5 MCG/ACT inhaler  Generic drug:  budesonide-formoterol     traMADol 50 MG tablet  Commonly known as:  ULTRAM     traZODone 100 MG tablet  Commonly known as:  DESYREL        STOP taking these medications    albuterol (2.5 MG/3ML) 0.083% nebulizer solution  Commonly known as:  PROVENTIL  Replaced by:  albuterol-ipratropium 2.5-0.5(3) mg/3 mL nebulizer     HYDROcodone-homatropine 5-1.5 MG/5ML syrup  Commonly known as:  HYCODAN           Where to Get Your Medications      These medications were sent to [facility] [address] [place] - [address]  [address], [place] [address]    Phone:  [phone]    albuterol-ipratropium 2.5-0.5(3) mg/3 mL  nebulizer   cefuroxime 250 MG tablet   dilTIAZem 300 MG 24 hr capsule   predniSONE 20 MG tablet             Hospital Course   Hospital Course (5 Days)     Rachael [name] is a 81 y.o. female with the following    Past Medical History:   Diagnosis Date   . Anxiety    . Arthritis    . 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 Chronic cholecystitis with calculus 10/31/2015   . Chronic obstructive pulmonary disease    . Gastroesophageal reflux disease    . Hiatal hernia    . Hyperlipidemia    . Hypertension    . Hypothyroidism    . Incontinent of urine 2014    -WEARS DIAPERS-WILL BRING HER OWN   . Low back pain    . Lymphedema of right lower extremity    . PMR (polymyalgia rheumatica) 01/07/2017   . Polymyalgia rheumatica 09/2014   . Sleep apnea     USES CPAP NIGHTLY   . Type 2 diabetes mellitus, controlled    . Type II or unspecified type diabetes mellitus without mention of complication, not stated as uncontrolled    . Venous insufficiency of right leg 2016    also has lymphedema-KEEPS HER LEGS WRAPPED EXCEPT FOR WHEN SHE BATHES       Past Surgical History:   Procedure Laterality Date   . BACK SURGERY  2014   . CARPAL TUNNEL RELEASE Bilateral 1978   . HYSTERECTOMY  1985   . ROBOTIC, CHOLECYSTECTOMY N/A 10/31/2015    Procedure: ROBOT ASSISTED, LAPAROSCOPIC, CHOLECYSTECTOMY, UMBILICAL HERNIA REPAIR;  Surgeon: Alen Blew, MD;  Location: [facility];  Service: General;  Laterality: N/A;   She presented with complaints of shortness of breath for 2 weeks. She was treated with Prednisone taper and Zithromax. She was given IM Solumedrol and given another course of Ceftin and Prednisone taper 2 days before admission.  Patient symptoms initially started with coughing, with productive cloudy mucus, congestion, fever and malaise/fatigue.  Patient then started having short of breath with wheezing,  however, patient continued to have shortness of breath despite the treatment.  Patient does  have a history of chronic obstructive pulmonary disease on chronic home oxygen at 2 L/m.  Patient states that her nephew and daughter were also sick as well    He presented with acute COPD exacerbation.   Sepsis with Lactic acidosis. Resolving with IV Ceftriaxone and Zithromax and negative cultures. Discharge on PO Ceftin (patient has the Rx)   COPD exacerbation improved. Discharge on Prednisone Taper , Duonebs scheduled. Pulmonary consulted. PRN antitussives, Arrange for Home Oxygen during the day time   OSA. CPAP at HS ordered   Hypertension/ tachycardia resolved after Increasing Diltiazem to 300 mg daily   Type 2 DM, Uncontrolled on Lantus Insulin and SSI. Increase Lantus to 15 units daily. Add pre-meal Humalog TID AC   PMR on Cymbalta 60 mg BID   Nocturnal Hypoxia on nighttime Oxygen. Advised to use Oxygen 24/7   Hyperlipidemia   Chronic back pain on PRN Tramadol    Procedures/Imaging:   Chest AP Portable   Final Result    No acute abnormal findings. No evidence of pneumonia.      Wynema Birch, MD    01/06/2017 9:49 PM          Treatment Team:   Attending Provider: Carmelia Bake, MD         Progress Note/Physical Exam at Discharge     Subjective: feels better clinically. Denies any cough or dyspnea    Vitals:    01/12/17 0459 01/12/17 0713 01/12/17 0903 01/12/17 1321   BP: 156/79  151/77 (!) 153/93   Pulse: 71 82 96 95   Resp: 18 20 16 16    Temp: 97.5 F (36.4 C)  97.3 F (36.3 C) 98.1 F (36.7 C)   TempSrc: Temporal Artery  Oral Temporal Artery   SpO2: 92% 93% 95% 95%   Weight: 81.5 kg (179 lb 11.2 oz)      Height:           General: NAD, AAOx3  HEENT: perrla, eomi, sclera anicteric, OP: Clear, MMM  Neck: supple, FROM, no LAD  Cardiovascular: RRR, no m/r/g  Lungs: Diminished breath sounds, scattered rhonchi noted, no w/r/r  Abdomen: soft, +BS, NT/ND, no masses, no g/r  Extremities: no C/C/E  Skin: no rashes or lesions noted       Diagnostics     Labs/Studies Pending at Discharge: No    Last Labs      Recent Labs  Lab 01/11/17  0428 01/09/17  0448 01/07/17  0511   WBC 10.51 12.25* 13.50*   RBC 4.31 4.57 4.39   Hgb 12.0 12.6 12.2   Hematocrit 37.2 38.7 37.9   MCV 86.3 84.7 86.3   Platelets 270 302 285         Recent Labs  Lab 01/11/17  0428 01/07/17  0511 01/06/17  2151   Sodium 138 137 132*   Potassium 3.6 4.1 4.2   Chloride 97* 102 97*   CO2 31* 22 24   BUN 20.0* 14.2 15.1   Creatinine 0.8 0.9 0.9   Glucose 306* 324* 277*   Calcium 8.8 8.2 9.0   Magnesium 2.3 1.8  --         Patient Instructions   Discharge Diet: cardiac diet  Discharge Activity:  activity as tolerated    Follow Up Appointment:  Follow-up Information     [facility] Follow up in 2 day(s).    Contact information:  [phone]           Carmelia Bake,  MD Follow up on 01/14/2017.    Specialty:  Internal Medicine  Why:  Appointment on 01/14/17, Thursday at 5:45 pm  Contact information:  [address]  [place] [address]  [phone]                    Time spent examining patient, discussing with patient/family regarding hospital course, chart review, reconciling medications and discharge planning: 45 minutes.    Signed,  Carmelia Bake, MD  2:48 PM 01/12/2017     Cc: PCP and all consultants on listed above

## 2017-01-11 NOTE — UM Notes (Shared)
On tele 01/10/17 :  Still short of breath. O2 sat 85% to 89% on room air.  O2 2L N/C today.

## 2017-01-11 NOTE — Progress Notes (Signed)
PULSE OXIMETRY TESTING:   Document patient's oxygen at rest on room air:   _____% on room air, at rest (No ranges please)   _____% on oxygen, at rest at_____LPM via NC   IF 88% OR BELOW on room air, STOP HERE,   IF NOT ambulate patient on room air with exertion and document below:   _____% on room air, with exertion (must be 88% or below)   _____% on oxygen, with exertion, at_____LPM via NC   All three tests must be during the same session.   Please document in a PROGRESS NOTE.   Testing to qualify for home oxygen must be no earlier than 48 hours prior to discharge, or it will need to be repeated.   Please call the home health care liaison at [phone] when completed.

## 2017-01-11 NOTE — Progress Notes (Signed)
Julious Payer Case Manager Signed Case Management/Social Work  Progress Notes   Date of Service: 01/11/2017 12:20 PM Creation Time: 01/11/2017 12:20 PM         [] Hide copied text  [] Hover for attribution information  PULSE OXIMETRY TESTING:   Document patient's oxygen at rest on room air:   __88___% on room air, at rest (No ranges please)   ___92__% on oxygen, at rest at___2__LPM via NC   IF 88% OR BELOW on room air, STOP HERE,   IF NOT ambulate patient on room air with exertion and document below:   _____% on room air, with exertion (must be 88% or below)   _____% on oxygen, with exertion, at_____LPM via NC   All three tests must be during the same session.   Please document in a PROGRESS NOTE.   Testing to qualify for home oxygen must be no earlier than 48 hours prior to discharge, or it will need to be repeated.   Please call the home health care liaison at [phone] when completed.

## 2017-01-11 NOTE — Plan of Care (Signed)
Problem: Psychosocial and Spiritual Needs  Goal: Demonstrates ability to cope with hospitalization/illness  Outcome: Progressing  RN offering emotional support and reassurance.

## 2017-01-11 NOTE — Plan of Care (Signed)
Problem: Moderate/High Fall Risk Score >5  Goal: Patient will remain free of falls   01/11/17 1323   OTHER   High (Greater than 13) HIGH-(VH Only) Plan with CNA for priority hourly rounding;HIGH-([facility]H Only) Keep door open for better visability;HIGH-Activate bed/chair exit alarm where available;MOD-Place Fall Risk level on whiteboard in room;MOD-Move patient to High risk if Heparin infusion, Coumadin, Lovenox >40mg /dose;MOD-Include family in multidisciplinary POC discussions;MOD-Use of assistive devices -Bedside Commode if appropriate;MOD-Remain with patient during toileting;MOD-Use of chair-pad alarm when appropriate   Hourly rounding continued. Call bell at bedside. No falls. No injury.    Problem: Pain  Goal: Pain at adequate level as identified by patient  Outcome: Progressing   01/10/17 2049   Goal/Interventions addressed this shift   Pain at adequate level as identified by patient Identify patient comfort function goal;Reassess pain within 30-60 minutes of any procedure/intervention, per Pain Assessment, Intervention, Reassessment (AIR) Cycle;Assess pain on admission, during daily assessment and/or before any "as needed" intervention(s);Evaluate if patient comfort function goal is met;Evaluate patient's satisfaction with pain management progress;Offer non-pharmacological pain management interventions   Pain assessed on hourly rounding.    Problem: Psychosocial and Spiritual Needs  Goal: Demonstrates ability to cope with hospitalization/illness  Outcome: Progressing  Pain assessed on hourly rounding.

## 2017-01-11 NOTE — Plan of Care (Signed)
Problem: Psychosocial and Spiritual Needs  Goal: Demonstrates ability to cope with hospitalization/illness  RN offering emotional support and reassurance.

## 2017-01-11 NOTE — Progress Notes (Signed)
[facility]  [address]  [place] [address]    Department of Rehabilitation Services  [phone]    Music Therapy Session Note      Patient: Rachael [name]    MRN#: [id number]     M201/M201-B    Referred by: PCU RN    Patient is agreeable to participation in the therapy session.    Diagnosis: COPD exacerbation [J44.1]  Elevated lactic acid level [R79.89]  Other elevated white blood cell (WBC) count [D72.828]    Reason for Referral: emotional support    Instruments/Activity: Guitar/Voice (MT) Music Listening    Description of Session: Pt awake and alert in bed, presenting with bright affect. Pt verbal about feeling blue due to physical limitations from COPD and also a general feeling of not being needed. She expressed a desire to feel better and to find things in her life that fulfil her.  Pt prefers easy listening music and requested "happy" by Pharell when asked what type of music makes her feel the most motivated. Music was presented as a platform for finding self-motivation and maintaining a positive spirit. MT and Pt discussed strategies for tailoring activities she used to like to do to her new physical capabilities. Pt was an active gardener and it was discovered that using those skills indoors for houseplants gives her back some of that feeling.     Patient's Response to Treatment: As music listening was initiated, Pt was tapping hands and feet to the rhythm and talking about how much she loves the song and the way it makes her feel. MT suggested making this song her fight song when feeling down/blue. Pt remained actively engaged in music listening and during live music. Pt appeared to benefit from music therapy today as evidenced by her expressing that she felt the visit helped her today.     Plan: Continue with Music Therapy service to address emotional support and engagement.     Tonye Becket MT-Intern

## 2017-01-12 LAB — GLUCOSE WHOLE BLOOD - POCT
Whole Blood Glucose POCT: 239 mg/dL — ABNORMAL HIGH (ref 70–100)
Whole Blood Glucose POCT: 268 mg/dL — ABNORMAL HIGH (ref 70–100)
Whole Blood Glucose POCT: 314 mg/dL — ABNORMAL HIGH (ref 70–100)

## 2017-01-12 MED ORDER — PREDNISONE 20 MG PO TABS
ORAL_TABLET | ORAL | 0 refills | Status: DC
Start: 2017-01-12 — End: 2017-02-04

## 2017-01-12 MED ORDER — CEFUROXIME AXETIL 250 MG PO TABS
250.0000 mg | ORAL_TABLET | Freq: Two times a day (BID) | ORAL | 0 refills | Status: DC
Start: 2017-01-12 — End: 2017-02-04

## 2017-01-12 MED ORDER — ALUM & MAG HYDROXIDE-SIMETH 200-200-20 MG/5ML PO SUSP
30.0000 mL | ORAL | Status: DC | PRN
Start: 2017-01-12 — End: 2017-01-12
  Administered 2017-01-12: 14:00:00 30 mL via ORAL
  Filled 2017-01-12: qty 30

## 2017-01-12 NOTE — Discharge Summary -  Nursing (Signed)
Patient's peripheral IV and tele box removed.  Reviewed discharge paperwork and instructions with patient and her daughter.  Set up home oxygen tank from respiratory therapist.  Patient left in wheelchair with personal belongings from security, to car to go home to personal residence.

## 2017-01-12 NOTE — PT Progress Note (Signed)
[facility]  [address]  [place], [place]. [address]    Department of Rehabilitation  [phone]    Physical Therapy Daily Treatment Note    Patient: Rachael [name]    MRN#: [id number]   M201/M201-B  Time of Treatment: Start Time: 1225 Stop Time: 1249 Time Calculation: 24 min  PT Visit Number: 2  Patient's medical condition is appropriate for Physical Therapy intervention at this time.    Precautions: SOB with activity (COPD), fall risk      Assessment:   Pt will benefit from Home Health PT to ensure safe transition home and to address the following deficits:  Decreased endurance/activity tolerance;Decreased LE strength;Decreased balance     Progress: Progressing toward goals   Patient Goal: To improve walking with less pain      Plan: Continue with Physical Therapy services to address functional mobility, endurance, and strength deficits.  Focus next session on: LE strengthening and ROM Exercise;Gait training   PT Frequency: 2-3x/wk   Based on today's session patient's discharge recommendation is the following:  Home with home health PT and supervision.         Subjective: Patient is agreeable to participation in the therapy session.  Nursing clears patient for therapy. Pt hopes to go home today.        Objective:  Observation of Patient/Vital Signs: patient vital signs performed by nursing staff, see flow chart for details.  Patient is seated in a bedside chair with telemetry and O2 at 2 liters/minute via nasal cannula in place.   Pt seen for functional activities and exercises as noted:     Cognition/Neuro Status  Arousal/Alertness: Appropriate responses to stimuli  Attention Span: Appears intact  Orientation Level: Oriented X4  Memory: Appears intact  Following Commands: Follows all commands and directions without difficulty  Safety Awareness: independent  Insights: Fully aware of deficits;Educated in safety awareness  Behavior: attentive;calm;cooperative  Hand Dominance: right  handed    Functional Mobility  Sit to Stand: Stand by Assist;Increased Time;Increased Effort;bed elevated;with instruction for hand placement to increase safety  Stand to Sit: Stand by Assist    Transfers  Toilet Transfer:  Supervision  Device Used for Functional Transfer: front-wheeled walker  Patient ambulated to bathroom, educated in and demonstrated safe toilet transfer; patient independent with clothing management; patient stood at sink to wash hands, no loss of balance with supervision.      Locomotion  Ambulation: Modified Independent/Supervision with front-wheeled walker, .Pt able to turn 90 and 180 degrees multiple times for directional changes, and maneuver RW around objects and in small spaces, without assistance and without loss of balance.  Pattern: decreased cadence;decreased step length  Distance Walked (Step 7): 105 Feet (with very short rest break - breathing technique reviewed, pt familiar)     Neuro Re-Ed  Sitting Balance: independent;without support  Standing Balance: independent;standing reaching activities;modified independent;supervision;with support;dynamic gait training;variable environment/backgrounds     Treatment Activities:  Bed mobility, transfers, balance assessment, LE ROM and strength ex's, safety awareness and cognition assessed, and activity tolerance noted.      Educated the patient to role of physical therapy, plan of care, goals of therapy and safety with mobility and ADLs,and pursed lip breathing.  Encouraged to continue to perform AE exercises frequently for circulation, and to perform LE therapeutic exercise  2-3 times throughout the day to decrease effects of immobility. Instructed patient to call for assistance and to not get up without assistance for safety. Pt voiced understanding. Patient left  reclined in bed, without needs and call bell within reach. Bed alarm set.  RN notified of session outcome.

## 2017-01-12 NOTE — Plan of Care (Signed)
Problem: Inadequate Gas Exchange  Goal: Patent Airway maintained  Outcome: Progressing   01/12/17 0032   Goal/Interventions addressed this shift   Patent airway maintained  Position patient for maximum ventilatory efficiency;Provide adequate fluid intake to liquefy secretions;Suction secretions as needed;Reinforce use of ordered respiratory interventions (i.e. CPAP, BiPAP, Incentive Spirometer, Acapella, etc.);Reposition patient every 2 hours and as needed unless able to self-reposition       Problem: Inadequate Airway Clearance  Goal: Normal respiratory rate/effort achieved/maintained  Outcome: Progressing   01/12/17 0032   Goal/Interventions addressed this shift   Normal respiratory rate/effort achieved/maintained Plan activities to conserve energy: plan rest periods

## 2017-01-12 NOTE — Plan of Care (Signed)
Problem: Compromised Hemodynamic Status  Goal: Vital signs and fluid balance maintained/improved  Outcome: Adequate for Discharge   01/12/17 1509   Goal/Interventions addressed this shift   Vital signs and fluid balance are maintained/improved Position patient for maximum circulation/cardiac output;Monitor/assess vitals and hemodynamic parameters with position changes;Monitor and compare daily weight;Monitor intake and output. Notify LIP if urine output is less than 30 mL/hour.;Monitor/assess lab values and report abnormal values       Problem: Safety  Goal: Patient will be free from injury during hospitalization  Outcome: Adequate for Discharge   01/12/17 1509   Goal/Interventions addressed this shift   Patient will be free from injury during hospitalization  Assess patient's risk for falls and implement fall prevention plan of care per policy;Provide and maintain safe environment;Use appropriate transfer methods;Ensure appropriate safety devices are available at the bedside;Include patient/ family/ care giver in decisions related to safety;Hourly rounding

## 2017-01-12 NOTE — Progress Notes (Addendum)
01/12/17 1347   Discharge Disposition   Patient preference/choice provided? Yes   Physical Discharge Disposition Home with Needs (Home w/ daughter and H.H services)    Name of Home Health Agency [facility]   Name of DME Agency [facility] [facility]  (Home 02 arranged. )   Mode of Engineer, water  (Daughter will transport pt home. )   Special requirements for patient during transport: Oxygen  (Informed Nurse to call Resp Therapist to request a [employer] Portable tank for transport. )   Outpatient Services   Home Health Skilled Nursing;Home PT/OT/ST   CM Interventions   Follow up appointment scheduled? Yes   Follow up appointment scheduled with: PCP   Referral made for home health RN visit? Yes   Multidisciplinary rounds/family meeting before d/c? Yes   Medicare Checklist   Is this a Medicare patient? Yes   If LOS 3 days or greater, did patient received 2nd IMM Letter? Yes

## 2017-02-04 ENCOUNTER — Inpatient Hospital Stay: Payer: No Typology Code available for payment source | Admitting: Internal Medicine

## 2017-02-04 ENCOUNTER — Emergency Department: Payer: No Typology Code available for payment source

## 2017-02-04 ENCOUNTER — Other Ambulatory Visit: Payer: No Typology Code available for payment source

## 2017-02-04 ENCOUNTER — Inpatient Hospital Stay
Admission: EM | Admit: 2017-02-04 | Discharge: 2017-02-08 | DRG: 864 | Disposition: A | Discharge status: Skilled Nursing Facility | Payer: No Typology Code available for payment source | Attending: Internal Medicine | Admitting: Internal Medicine

## 2017-02-04 DIAGNOSIS — M1990 Unspecified osteoarthritis, unspecified site: Secondary | ICD-10-CM | POA: Diagnosis present

## 2017-02-04 DIAGNOSIS — M545 Low back pain: Secondary | ICD-10-CM | POA: Diagnosis present

## 2017-02-04 DIAGNOSIS — R531 Weakness: Secondary | ICD-10-CM

## 2017-02-04 DIAGNOSIS — R509 Fever, unspecified: Principal | ICD-10-CM | POA: Diagnosis present

## 2017-02-04 DIAGNOSIS — J449 Chronic obstructive pulmonary disease, unspecified: Secondary | ICD-10-CM | POA: Diagnosis present

## 2017-02-04 DIAGNOSIS — M353 Polymyalgia rheumatica: Secondary | ICD-10-CM | POA: Diagnosis present

## 2017-02-04 DIAGNOSIS — R262 Difficulty in walking, not elsewhere classified: Secondary | ICD-10-CM

## 2017-02-04 DIAGNOSIS — Z87891 Personal history of nicotine dependence: Secondary | ICD-10-CM

## 2017-02-04 DIAGNOSIS — I10 Essential (primary) hypertension: Secondary | ICD-10-CM | POA: Diagnosis present

## 2017-02-04 DIAGNOSIS — Z794 Long term (current) use of insulin: Secondary | ICD-10-CM

## 2017-02-04 DIAGNOSIS — E119 Type 2 diabetes mellitus without complications: Secondary | ICD-10-CM | POA: Diagnosis present

## 2017-02-04 DIAGNOSIS — E039 Hypothyroidism, unspecified: Secondary | ICD-10-CM | POA: Diagnosis present

## 2017-02-04 DIAGNOSIS — K219 Gastro-esophageal reflux disease without esophagitis: Secondary | ICD-10-CM | POA: Diagnosis present

## 2017-02-04 DIAGNOSIS — I872 Venous insufficiency (chronic) (peripheral): Secondary | ICD-10-CM | POA: Diagnosis present

## 2017-02-04 DIAGNOSIS — G4733 Obstructive sleep apnea (adult) (pediatric): Secondary | ICD-10-CM | POA: Diagnosis present

## 2017-02-04 DIAGNOSIS — F419 Anxiety disorder, unspecified: Secondary | ICD-10-CM | POA: Diagnosis present

## 2017-02-04 DIAGNOSIS — E876 Hypokalemia: Secondary | ICD-10-CM | POA: Diagnosis present

## 2017-02-04 DIAGNOSIS — E785 Hyperlipidemia, unspecified: Secondary | ICD-10-CM | POA: Diagnosis present

## 2017-02-04 DIAGNOSIS — Z7982 Long term (current) use of aspirin: Secondary | ICD-10-CM

## 2017-02-04 DIAGNOSIS — Z9981 Dependence on supplemental oxygen: Secondary | ICD-10-CM

## 2017-02-04 DIAGNOSIS — K449 Diaphragmatic hernia without obstruction or gangrene: Secondary | ICD-10-CM | POA: Diagnosis present

## 2017-02-04 DIAGNOSIS — I890 Lymphedema, not elsewhere classified: Secondary | ICD-10-CM | POA: Diagnosis present

## 2017-02-04 DIAGNOSIS — M62830 Muscle spasm of back: Secondary | ICD-10-CM | POA: Diagnosis present

## 2017-02-04 DIAGNOSIS — I2510 Atherosclerotic heart disease of native coronary artery without angina pectoris: Secondary | ICD-10-CM | POA: Diagnosis present

## 2017-02-04 DIAGNOSIS — Z7984 Long term (current) use of oral hypoglycemic drugs: Secondary | ICD-10-CM

## 2017-02-04 LAB — COMPREHENSIVE METABOLIC PANEL
ALT: 33 U/L (ref 0–55)
ALT: 30 U/L (ref 0–55)
AST (SGOT): 33 U/L (ref 5–34)
AST (SGOT): 22 U/L (ref 5–34)
Albumin/Globulin Ratio: 0.9 (ref 0.9–2.2)
Albumin/Globulin Ratio: 1.3 (ref 0.9–2.2)
Albumin: 3.4 g/dL — ABNORMAL LOW (ref 3.5–5.0)
Albumin: 3.2 g/dL — ABNORMAL LOW (ref 3.5–5.0)
Alkaline Phosphatase: 70 U/L (ref 37–106)
Alkaline Phosphatase: 62 U/L (ref 37–106)
Anion Gap: 14 (ref 5.0–15.0)
Anion Gap: 10 (ref 5.0–15.0)
BUN: 12 mg/dL (ref 7.0–19.0)
BUN: 10.2 mg/dL (ref 7.0–19.0)
Bilirubin, Total: 0.6 mg/dL (ref 0.2–1.2)
Bilirubin, Total: 0.4 mg/dL (ref 0.2–1.2)
CO2: 26 mEq/L (ref 22–29)
CO2: 31 mEq/L — ABNORMAL HIGH (ref 22–29)
Calcium: 10.3 mg/dL — ABNORMAL HIGH (ref 7.9–10.2)
Calcium: 9.6 mg/dL (ref 7.9–10.2)
Chloride: 94 mEq/L — ABNORMAL LOW (ref 100–111)
Chloride: 95 mEq/L — ABNORMAL LOW (ref 100–111)
Creatinine: 1 mg/dL (ref 0.6–1.0)
Creatinine: 0.8 mg/dL (ref 0.6–1.0)
Globulin: 3.6 g/dL (ref 2.0–3.6)
Globulin: 2.5 g/dL (ref 2.0–3.6)
Glucose: 190 mg/dL — ABNORMAL HIGH (ref 70–100)
Glucose: 212 mg/dL — ABNORMAL HIGH (ref 70–100)
Potassium: 3.8 mEq/L (ref 3.5–5.1)
Potassium: 3.3 mEq/L — ABNORMAL LOW (ref 3.5–5.1)
Protein, Total: 7 g/dL (ref 6.0–8.3)
Protein, Total: 5.7 g/dL — ABNORMAL LOW (ref 6.0–8.3)
Sodium: 134 mEq/L — ABNORMAL LOW (ref 136–145)
Sodium: 136 mEq/L (ref 136–145)

## 2017-02-04 LAB — CBC AND DIFFERENTIAL
Absolute NRBC: 0 10*3/uL
Basophils Absolute Automated: 0.04 10*3/uL (ref 0.00–0.20)
Basophils Automated: 0.6 %
Eosinophils Absolute Automated: 0.13 10*3/uL (ref 0.00–0.70)
Eosinophils Automated: 2.1 %
Hematocrit: 40.9 % (ref 37.0–47.0)
Hgb: 13.1 g/dL (ref 12.0–16.0)
Immature Granulocytes Absolute: 0.05 10*3/uL
Immature Granulocytes: 0.8 %
Lymphocytes Absolute Automated: 0.82 10*3/uL (ref 0.50–4.40)
Lymphocytes Automated: 13.3 %
MCH: 28.2 pg (ref 28.0–32.0)
MCHC: 32 g/dL (ref 32.0–36.0)
MCV: 88 fL (ref 80.0–100.0)
MPV: 9.2 fL — ABNORMAL LOW (ref 9.4–12.3)
Monocytes Absolute Automated: 0.35 10*3/uL (ref 0.00–1.20)
Monocytes: 5.7 %
Neutrophils Absolute: 4.77 10*3/uL (ref 1.80–8.10)
Neutrophils: 77.5 %
Nucleated RBC: 0 /100 WBC (ref 0.0–1.0)
Platelets: 304 10*3/uL (ref 140–400)
RBC: 4.65 10*6/uL (ref 4.20–5.40)
RDW: 17 % — ABNORMAL HIGH (ref 12–15)
WBC: 6.16 10*3/uL (ref 3.50–10.80)

## 2017-02-04 LAB — GFR
EGFR: 53.5
EGFR: >60

## 2017-02-04 LAB — URINALYSIS, REFLEX TO MICROSCOPIC EXAM IF INDICATED
Bilirubin, UA: NEGATIVE
Blood, UA: NEGATIVE
Glucose, UA: NEGATIVE
Ketones UA: NEGATIVE
Leukocyte Esterase, UA: NEGATIVE
Nitrite, UA: NEGATIVE
Protein, UR: NEGATIVE
Specific Gravity UA: 1.013 (ref 1.001–1.035)
Urine pH: 5 (ref 5.0–8.0)
Urobilinogen, UA: NORMAL mg/dL

## 2017-02-04 LAB — ECG 12-LEAD
Atrial Rate: 107 {beats}/min
P Axis: 44 degrees
P-R Interval: 144 ms
Q-T Interval: 324 ms
QRS Duration: 60 ms
QTC Calculation (Bezet): 432 ms
R Axis: 34 degrees
T Axis: 129 degrees
Ventricular Rate: 107 {beats}/min

## 2017-02-04 LAB — TROPONIN I: Troponin I: 0.02 ng/mL (ref 0.00–0.09)

## 2017-02-04 LAB — B-TYPE NATRIURETIC PEPTIDE: B-Natriuretic Peptide: 47.9 pg/mL (ref 0.0–100.0)

## 2017-02-04 LAB — HEMOGLOBIN A1C
Average Estimated Glucose: 197.3 mg/dL
Hemoglobin A1C: 8.5 % — ABNORMAL HIGH (ref 4.6–5.9)

## 2017-02-04 LAB — GLUCOSE WHOLE BLOOD - POCT: Whole Blood Glucose POCT: 202 mg/dL — ABNORMAL HIGH (ref 70–100)

## 2017-02-04 MED ORDER — RISAQUAD PO CAPS
1.0000 | ORAL_CAPSULE | Freq: Every day | ORAL | Status: DC
Start: 2017-02-04 — End: 2017-02-08
  Administered 2017-02-05 – 2017-02-08 (×4): 1 via ORAL
  Filled 2017-02-04 (×4): qty 1

## 2017-02-04 MED ORDER — ASPIRIN 81 MG PO CHEW
324.0000 mg | CHEWABLE_TABLET | Freq: Once | ORAL | Status: AC
Start: 2017-02-04 — End: 2017-02-04
  Administered 2017-02-04: 13:00:00 324 mg via ORAL
  Filled 2017-02-04: qty 4

## 2017-02-04 MED ORDER — POTASSIUM CHLORIDE 10 MEQ/100ML IV SOLN
10.0000 meq | INTRAVENOUS | Status: AC
Start: 2017-02-04 — End: 2017-02-04
  Administered 2017-02-04 (×2): 10 meq via INTRAVENOUS
  Filled 2017-02-04: qty 200

## 2017-02-04 MED ORDER — ONDANSETRON HCL 4 MG/2ML IJ SOLN
4.0000 mg | Freq: Four times a day (QID) | INTRAMUSCULAR | Status: DC | PRN
Start: 2017-02-04 — End: 2017-02-08

## 2017-02-04 MED ORDER — ONDANSETRON HCL 4 MG/2ML IJ SOLN
4.0000 mg | Freq: Once | INTRAMUSCULAR | Status: AC
Start: 2017-02-04 — End: 2017-02-04
  Administered 2017-02-04: 10:00:00 4 mg via INTRAVENOUS
  Filled 2017-02-04: qty 2

## 2017-02-04 MED ORDER — HYDRALAZINE HCL 25 MG PO TABS
25.0000 mg | ORAL_TABLET | Freq: Two times a day (BID) | ORAL | Status: DC
Start: 2017-02-04 — End: 2017-02-08
  Administered 2017-02-04 – 2017-02-08 (×8): 25 mg via ORAL
  Filled 2017-02-04 (×8): qty 1

## 2017-02-04 MED ORDER — LOSARTAN POTASSIUM 100 MG PO TABS
1.0000 | ORAL_TABLET | Freq: Every morning | ORAL | Status: DC
Start: 2017-02-04 — End: 2017-02-04

## 2017-02-04 MED ORDER — GLUCOSE 40 % PO GEL
15.0000 g | ORAL | Status: DC | PRN
Start: 2017-02-04 — End: 2017-02-08

## 2017-02-04 MED ORDER — POLYETHYLENE GLYCOL 3350 17 G PO PACK
17.0000 g | PACK | Freq: Every day | ORAL | Status: DC | PRN
Start: 2017-02-04 — End: 2017-02-08
  Administered 2017-02-05: 11:00:00 17 g via ORAL
  Filled 2017-02-04: qty 1

## 2017-02-04 MED ORDER — GLUCAGON 1 MG IJ SOLR (WRAP)
1.0000 mg | INTRAMUSCULAR | Status: DC | PRN
Start: 2017-02-04 — End: 2017-02-08

## 2017-02-04 MED ORDER — HYDRALAZINE HCL 20 MG/ML IJ SOLN
10.0000 mg | INTRAMUSCULAR | Status: DC | PRN
Start: 2017-02-04 — End: 2017-02-08

## 2017-02-04 MED ORDER — ONDANSETRON 4 MG PO TBDP
4.0000 mg | ORAL_TABLET | Freq: Four times a day (QID) | ORAL | Status: DC | PRN
Start: 2017-02-04 — End: 2017-02-08
  Administered 2017-02-08: 06:00:00 4 mg via ORAL
  Filled 2017-02-04: qty 1

## 2017-02-04 MED ORDER — GUAIFENESIN ER 600 MG PO TB12
600.0000 mg | ORAL_TABLET | Freq: Every day | ORAL | Status: DC
Start: 2017-02-04 — End: 2017-02-08
  Administered 2017-02-05 – 2017-02-08 (×4): 600 mg via ORAL
  Filled 2017-02-04 (×4): qty 1

## 2017-02-04 MED ORDER — DILTIAZEM HCL ER COATED BEADS 300 MG PO CP24
300.0000 mg | ORAL_CAPSULE | Freq: Every day | ORAL | Status: DC
Start: 2017-02-04 — End: 2017-02-08
  Administered 2017-02-05 – 2017-02-08 (×4): 300 mg via ORAL
  Filled 2017-02-04 (×4): qty 1

## 2017-02-04 MED ORDER — IPRATROPIUM BROMIDE 0.02 % IN SOLN
0.5000 mg | Freq: Four times a day (QID) | RESPIRATORY_TRACT | Status: DC
Start: 2017-02-04 — End: 2017-02-04
  Administered 2017-02-04: 20:00:00 0.5 mg via RESPIRATORY_TRACT
  Filled 2017-02-04: qty 2.5

## 2017-02-04 MED ORDER — DULOXETINE HCL 60 MG PO CPEP
60.0000 mg | ORAL_CAPSULE | Freq: Two times a day (BID) | ORAL | Status: DC
Start: 2017-02-04 — End: 2017-02-08
  Administered 2017-02-04 – 2017-02-08 (×9): 60 mg via ORAL
  Filled 2017-02-04 (×9): qty 1

## 2017-02-04 MED ORDER — TRAZODONE HCL 50 MG PO TABS
50.0000 mg | ORAL_TABLET | Freq: Every evening | ORAL | Status: DC
Start: 2017-02-04 — End: 2017-02-08
  Administered 2017-02-04 – 2017-02-07 (×4): 50 mg via ORAL
  Filled 2017-02-04 (×4): qty 1

## 2017-02-04 MED ORDER — INSULIN GLARGINE 100 UNIT/ML SC SOLN
14.0000 [IU] | Freq: Every morning | SUBCUTANEOUS | Status: DC
Start: 2017-02-04 — End: 2017-02-08
  Administered 2017-02-05 – 2017-02-08 (×4): 14 [IU] via SUBCUTANEOUS
  Filled 2017-02-04 (×4): qty 14

## 2017-02-04 MED ORDER — PANTOPRAZOLE SODIUM 40 MG PO TBEC
40.0000 mg | DELAYED_RELEASE_TABLET | Freq: Every morning | ORAL | Status: DC
Start: 2017-02-04 — End: 2017-02-08
  Administered 2017-02-05 – 2017-02-08 (×4): 40 mg via ORAL
  Filled 2017-02-04 (×4): qty 1

## 2017-02-04 MED ORDER — ASPIRIN 81 MG PO CHEW
81.0000 mg | CHEWABLE_TABLET | Freq: Every day | ORAL | Status: DC
Start: 2017-02-05 — End: 2017-02-08
  Administered 2017-02-05 – 2017-02-08 (×4): 81 mg via ORAL
  Filled 2017-02-04 (×4): qty 1

## 2017-02-04 MED ORDER — IPRATROPIUM BROMIDE 0.02 % IN SOLN
0.5000 mg | Freq: Four times a day (QID) | RESPIRATORY_TRACT | Status: DC
Start: 2017-02-04 — End: 2017-02-08
  Administered 2017-02-05 – 2017-02-08 (×9): 0.5 mg via RESPIRATORY_TRACT
  Filled 2017-02-04 (×10): qty 2.5

## 2017-02-04 MED ORDER — METFORMIN HCL 500 MG PO TABS
500.0000 mg | ORAL_TABLET | Freq: Two times a day (BID) | ORAL | Status: DC
Start: 2017-02-04 — End: 2017-02-08
  Administered 2017-02-05 – 2017-02-08 (×8): 500 mg via ORAL
  Filled 2017-02-04 (×8): qty 1

## 2017-02-04 MED ORDER — FLUTICASONE FUROATE-VILANTEROL 200-25 MCG/INH IN AEPB
1.0000 | INHALATION_SPRAY | Freq: Every morning | RESPIRATORY_TRACT | Status: DC
Start: 2017-02-04 — End: 2017-02-08
  Administered 2017-02-05 – 2017-02-08 (×4): 1 via RESPIRATORY_TRACT
  Filled 2017-02-04: qty 14

## 2017-02-04 MED ORDER — TRAMADOL HCL 50 MG PO TABS
50.0000 mg | ORAL_TABLET | ORAL | Status: DC | PRN
Start: 2017-02-04 — End: 2017-02-07

## 2017-02-04 MED ORDER — LEVOTHYROXINE SODIUM 50 MCG PO TABS
50.0000 ug | ORAL_TABLET | Freq: Every day | ORAL | Status: DC
Start: 2017-02-04 — End: 2017-02-08
  Administered 2017-02-05 – 2017-02-08 (×4): 50 ug via ORAL
  Filled 2017-02-04 (×4): qty 1

## 2017-02-04 MED ORDER — DEXTROSE 50 % IV SOLN
12.5000 g | INTRAVENOUS | Status: DC | PRN
Start: 2017-02-04 — End: 2017-02-08

## 2017-02-04 MED ORDER — MORPHINE SULFATE 2 MG/ML IJ/IV SOLN (WRAP)
2.0000 mg | Freq: Once | Status: AC
Start: 2017-02-04 — End: 2017-02-04
  Administered 2017-02-04: 10:00:00 2 mg via INTRAVENOUS
  Filled 2017-02-04: qty 1

## 2017-02-04 MED ORDER — HYDROCODONE-ACETAMINOPHEN 5-325 MG PO TABS
1.0000 | ORAL_TABLET | ORAL | Status: DC | PRN
Start: 2017-02-04 — End: 2017-02-08
  Administered 2017-02-04 – 2017-02-08 (×9): 1 via ORAL
  Filled 2017-02-04 (×9): qty 1

## 2017-02-04 MED ORDER — ATORVASTATIN CALCIUM 10 MG PO TABS
10.0000 mg | ORAL_TABLET | Freq: Every evening | ORAL | Status: DC
Start: 2017-02-04 — End: 2017-02-08
  Administered 2017-02-04 – 2017-02-07 (×4): 10 mg via ORAL
  Filled 2017-02-04 (×4): qty 1

## 2017-02-04 MED ORDER — ENOXAPARIN SODIUM 40 MG/0.4ML SC SOLN
40.0000 mg | Freq: Every day | SUBCUTANEOUS | Status: DC
Start: 2017-02-04 — End: 2017-02-08
  Administered 2017-02-04 – 2017-02-07 (×4): 40 mg via SUBCUTANEOUS
  Filled 2017-02-04 (×4): qty 0.4

## 2017-02-04 MED ORDER — INSULIN LISPRO 100 UNIT/ML SC SOLN
1.0000 [IU] | Freq: Three times a day (TID) | SUBCUTANEOUS | Status: DC | PRN
Start: 2017-02-04 — End: 2017-02-08
  Administered 2017-02-05 – 2017-02-07 (×6): 1 [IU] via SUBCUTANEOUS
  Administered 2017-02-07: 13:00:00 5 [IU] via SUBCUTANEOUS
  Administered 2017-02-08 (×2): 1 [IU] via SUBCUTANEOUS
  Filled 2017-02-04 (×2): qty 3
  Filled 2017-02-04: qty 15
  Filled 2017-02-04 (×6): qty 3

## 2017-02-04 MED ORDER — METOPROLOL SUCCINATE ER 25 MG PO TB24
25.0000 mg | ORAL_TABLET | Freq: Two times a day (BID) | ORAL | Status: DC
Start: 2017-02-04 — End: 2017-02-04

## 2017-02-04 MED ORDER — IPRATROPIUM BROMIDE 0.02 % IN SOLN
0.5000 mg | Freq: Four times a day (QID) | RESPIRATORY_TRACT | Status: DC | PRN
Start: 2017-02-04 — End: 2017-02-08
  Administered 2017-02-07: 08:00:00 0.5 mg via RESPIRATORY_TRACT

## 2017-02-04 MED ORDER — METOPROLOL TARTRATE 25 MG PO TABS
25.0000 mg | ORAL_TABLET | Freq: Two times a day (BID) | ORAL | Status: DC
Start: 2017-02-04 — End: 2017-02-08
  Administered 2017-02-04 – 2017-02-08 (×8): 25 mg via ORAL
  Filled 2017-02-04 (×8): qty 1

## 2017-02-04 MED ORDER — HYDROCHLOROTHIAZIDE 25 MG PO TABS
25.0000 mg | ORAL_TABLET | Freq: Every day | ORAL | Status: DC
Start: 2017-02-04 — End: 2017-02-08
  Administered 2017-02-05 – 2017-02-08 (×4): 25 mg via ORAL
  Filled 2017-02-04 (×4): qty 1

## 2017-02-04 MED ORDER — INSULIN LISPRO 100 UNIT/ML SC SOLN
1.0000 [IU] | Freq: Every evening | SUBCUTANEOUS | Status: DC | PRN
Start: 2017-02-04 — End: 2017-02-08
  Administered 2017-02-05 – 2017-02-07 (×3): 1 [IU] via SUBCUTANEOUS
  Filled 2017-02-04 (×2): qty 3

## 2017-02-04 MED ORDER — GABAPENTIN 300 MG PO CAPS
300.0000 mg | ORAL_CAPSULE | Freq: Two times a day (BID) | ORAL | Status: DC
Start: 2017-02-04 — End: 2017-02-08
  Administered 2017-02-04 – 2017-02-08 (×8): 300 mg via ORAL
  Filled 2017-02-04 (×8): qty 1

## 2017-02-04 MED ORDER — LOSARTAN POTASSIUM 100 MG PO TABS
100.0000 mg | ORAL_TABLET | Freq: Every day | ORAL | Status: DC
Start: 2017-02-04 — End: 2017-02-08
  Administered 2017-02-05 – 2017-02-08 (×4): 100 mg via ORAL
  Filled 2017-02-04 (×4): qty 1

## 2017-02-04 NOTE — ED Provider Notes (Signed)
Physician/Midlevel provider first contact with patient: 02/04/17 0844         History     Chief Complaint   Patient presents with   . Generalized weakness   . Nausea     HPI     Patient Name: Rachael [name], Rachael [name], 81 y.o., female      DR. Evalie Hargraves  is the primary attending for this patient and performed the HPI, PE, and medical decision making for this patient.    History obtained by: patient  CC/Onset/Duration/Quality/Location/Severity/Context: Patient with chief complaint of lower back pain with bilateral leg pain occurring in spasms intermittently over the last 3-4 days.  States pain is typically worse at night with associated nausea.  Has a history of chronic obstructive pulmonary disease and was post reduction last episode did not take it last night and was found to be having low oxygen levels at 84 percent when EMS arrived.  Patient currently denies any respiratory symptoms.  Denies any chest pain.  Aggravating factors are : History of chronic back pain. Alleviating factors are : Nothing specific taking Ultram last night  Associated Symptoms: Denies numbness, tingling, weakness, bowel bladder incontinence, chest pain, fever, chills, trauma.        *This note was generated by the Epic EMR system/ Dragon speech recognition and may contain inherent errors or omissions not intended by the user. Grammatical errors, random word insertions, deletions, pronoun errors and incomplete sentences are occasional consequences of this technology due to software limitations. Not all errors are caught or corrected. If there are questions or concerns about the content of this note or information contained within the body of this dictation they should be addressed directly with the author for clarification          Past Medical History:   Diagnosis Date   . Anxiety    . Arthritis    . 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 Chronic cholecystitis with calculus 10/31/2015   . Chronic obstructive pulmonary  disease    . Gastroesophageal reflux disease    . Hiatal hernia    . Hyperlipidemia    . Hypertension    . Hypothyroidism    . Incontinent of urine 2014    -WEARS DIAPERS-WILL BRING HER OWN   . Low back pain    . Lymphedema of right lower extremity    . PMR (polymyalgia rheumatica) 01/07/2017   . Polymyalgia rheumatica 09/2014   . Sleep apnea     USES CPAP NIGHTLY   . Type 2 diabetes mellitus, controlled    . Type II or unspecified type diabetes mellitus without mention of complication, not stated as uncontrolled    . Venous insufficiency of right leg 2016    also has lymphedema-KEEPS HER LEGS WRAPPED EXCEPT FOR WHEN SHE BATHES       Past Surgical History:   Procedure Laterality Date   . BACK SURGERY  2014   . CARPAL TUNNEL RELEASE Bilateral 1978   . HYSTERECTOMY  1985   . ROBOTIC, CHOLECYSTECTOMY N/A 10/31/2015    Procedure: ROBOT ASSISTED, LAPAROSCOPIC, CHOLECYSTECTOMY, UMBILICAL HERNIA REPAIR;  Surgeon: Alen Blew, MD;  Location: [facility];  Service: General;  Laterality: N/A;       History reviewed. No pertinent family history.    Social  Social History   Substance Use Topics   . Smoking status: Former Smoker     Packs/day: 1.00     Years:  25.00     Quit date: 11/16/1998   . Smokeless tobacco: Never Used   . Alcohol use 1.8 oz/week     3 Glasses of wine per week      Comment: social       .     Allergies   Allergen Reactions   . Tolectin [Tolmetin] Swelling   . Lyrica [Pregabalin]    . Latex Itching       Home Medications     Med List Status:  In Progress Set By: Samara Snide, RN at 02/04/2017  8:58 AM                albuterol-ipratropium (DUO-NEB) 2.5-0.5(3) mg/3 mL nebulizer     Take 3 mLs by nebulization every 4 (four) hours.     dilTIAZem (CARDIZEM CD) 300 MG 24 hr capsule     Take 1 capsule (300 mg total) by mouth daily.     DULoxetine (CYMBALTA) 60 MG capsule     Take 60 mg by mouth 2 (two) times daily.     gabapentin (NEURONTIN) 300 MG capsule     Take 300 mg by mouth 2 (two) times  daily.     guaiFENesin (MUCINEX) 600 MG 12 hr tablet     Take 1 tablet (600 mg total) by mouth every 12 (twelve) hours.     Patient taking differently:  Take 600 mg by mouth daily.         hydrALAZINE (APRESOLINE) 25 MG tablet     Take 1 tablet by mouth 2 (two) times daily.     ipratropium (ATROVENT) 0.02 % nebulizer solution     Take 500 mcg by nebulization 4 (four) times daily.         lactobacillus/streptococcus (RISAQUAD) Cap     Take 1 capsule by mouth daily.     levothyroxine (SYNTHROID, LEVOTHROID) 50 MCG tablet     Take 50 mcg by mouth Once a day at 6:00am.     LIRAGLUTIDE SC     Inject 1.2 Units into the skin.         losartan-hydrochlorothiazide (HYZAAR) 100-25 MG per tablet     Take 1 tablet by mouth every morning.     metFORMIN (GLUCOPHAGE-XR) 750 MG 24 hr tablet     Take 750 mg by mouth nightly.     metoprolol succinate XL (TOPROL-XL) 25 MG 24 hr tablet     Take 25 mg by mouth 2 (two) times daily.     pantoprazole (PROTONIX) 40 MG tablet     Take 40 mg by mouth daily.        Pitavastatin Calcium (LIVALO) 2 MG Tab     Take 1 tablet by mouth nightly.     polyethylene glycol (MIRALAX) packet     Take 17 g by mouth daily as needed (Constipation).     potassium chloride (K-TAB,KLOR-CON) 10 MEQ tablet     Take 10 mEq by mouth daily.        predniSONE (DELTASONE) 20 MG tablet     2 tabs for 4 days, 1 tab for 4 days, 0.5 tab for 4 days     SYMBICORT 160-4.5 MCG/ACT inhaler     Inhale 2 puffs into the lungs 2 (two) times daily.        traMADol (ULTRAM) 50 MG tablet     TAKE 1 TABLET BY MOUTH TWICE DAILY AS NEEDED FOR PAIN     traZODone (DESYREL) 100 MG tablet  Take 50 mg by mouth nightly.                                   Review of Systems   Constitutional: Negative for fever.   Respiratory: Negative for shortness of breath.    Cardiovascular: Negative for chest pain.   Gastrointestinal: Positive for nausea. Negative for abdominal pain and vomiting.   Musculoskeletal: Positive for back pain. Negative for neck  pain.   Skin: Negative for pallor and rash.   Neurological: Positive for weakness. Negative for syncope and headaches.   All other systems reviewed and are negative.      Physical Exam    BP: 140/64, Heart Rate: (!) 105, Temp: 98.6 F (37 C), Resp Rate: 20, SpO2: 93 %, Weight: 75.8 kg    Physical Exam   Constitutional: She is oriented to person, place, and time. She appears distressed.   HENT:   Head: Normocephalic and atraumatic.   Mouth/Throat: Oropharynx is clear and moist.   Eyes: Conjunctivae and EOM are normal.   Neck: Normal range of motion. Neck supple.   Cardiovascular: Normal rate, regular rhythm, normal heart sounds and intact distal pulses.    Pulmonary/Chest: Effort normal. No respiratory distress. She has rales (r base).   Abdominal: Soft. Bowel sounds are normal. She exhibits no distension. There is no tenderness. There is no CVA tenderness.   Musculoskeletal: Normal range of motion. She exhibits no edema.   No direct lumbar spine tenderness.  No flank tenderness present.   Neurological: She is alert and oriented to person, place, and time. She has normal strength. She displays normal reflexes. No cranial nerve deficit. Coordination normal.   Babinski reflex normal b le. Good strength with dorsi/plantar flexion at b great toes. No sensory deficits.     Skin: Skin is warm and dry. No rash noted.   Nursing note and vitals reviewed.        MDM and ED Course     ED Medication Orders     Start Ordered     Status Ordering Provider    02/04/17 [phone] 02/04/17 0858  ondansetron (ZOFRAN) injection 4 mg  Once     Route: Intravenous  Ordered Dose: 4 mg     Last MAR action:  Given Drue Second, [facility]    02/04/17 0859 02/04/17 0858  morphine injection 2 mg  Once     Route: Intravenous  Ordered Dose: 2 mg     Last MAR action:  Given Zeba Luby             MDM  Number of Diagnoses or Management Options  Diagnosis management comments:   Differential Diagnosis to include but not limited to: herniated disc, lumbar  strain, cauda equina, lumbar fracture, pyelonephritis, kidney stone.               Amount and/or Complexity of Data Reviewed  Clinical lab tests: ordered and reviewed  Tests in the radiology section of CPT: ordered and reviewed  Independent visualization of images, tracings, or specimens: yes    Risk of Complications, Morbidity, and/or Mortality  Presenting problems: high  Diagnostic procedures: high  Management options: high          ED Course        EKG Interpretation  EKG interpreted independently by Dr. Helayne Seminole    Rate: Tachycardic  Rhythm: sinus tachycardia  Axis: Normal  ST-T Segments: nonspec  st-t changes  Conduction: No blocks  Impression: Non-specific EKG    No significant change from previous EKG on record.    Helayne Seminole, MD      Reason for not initiating tPA (alteplase) within 4.5 hours of onset of symptoms: Best estimate of time since last known normal exceeds the recommended window for tPA treatment (> 3 or 4.5 hours)     Discussion with family after they arrived and the family reports the patient's been having difficulty ambulating for the last week 4 hours.  Per family she seems to be having a shuffling gait that is new most likely Parkinson's disease.  Patient usually uses a walker struggling with her walker.  I did a relation tests in the emergency department the patient seemed to be struggling and requiring support.  There is no distal focal neurological deficits present.  CT of the head was done which was negative.  Patient to be admitted for an MRI.  Case management is involved with the patient's care.    ED course and reassessment at time of admission and final disposition - stable.    Results discussed with patient and/or family.    I spoke to admitting physician regarding admission. Pt presentation, course and results relayed to admitting doctor.    Results     Procedure Component Value Units Date/Time    B-type Natriuretic Peptide [[id number]] Collected:  02/04/17 0910    Specimen:   Blood Updated:  02/04/17 1006     B-Natriuretic Peptide 47.9 pg/mL     UA with reflex to micro (pts  3 + yrs) [[id number]]  (Abnormal) Collected:  02/04/17 0947    Specimen:  Urine Updated:  02/04/17 0957     Urine Type Catheterized, I     Color, UA Yellow     Clarity, UA Cloudy (A)     Specific Gravity UA 1.013     Urine pH 5.0     Leukocyte Esterase, UA Negative     Nitrite, UA Negative     Protein, UR Negative     Glucose, UA Negative     Ketones UA Negative     Urobilinogen, UA Normal mg/dL      Bilirubin, UA Negative     Blood, UA Negative    Comprehensive metabolic panel [[id number]]  (Abnormal) Collected:  02/04/17 0910    Specimen:  Blood Updated:  02/04/17 0946     Glucose 190 (H) mg/dL      BUN 08.6 mg/dL      Creatinine 1.0 mg/dL      Sodium 578 (L) mEq/L      Potassium 3.8 mEq/L      Chloride 94 (L) mEq/L      CO2 26 mEq/L      Calcium 10.3 (H) mg/dL      Protein, Total 7.0 g/dL      Albumin 3.4 (L) g/dL      AST (SGOT) 33 U/L      ALT 33 U/L      Alkaline Phosphatase 70 U/L      Bilirubin, Total 0.6 mg/dL      Globulin 3.6 g/dL      Albumin/Globulin Ratio 0.9     Anion Gap 14.0    GFR [[id number]] Collected:  02/04/17 0910     Updated:  02/04/17 0946     EGFR 53.5    Troponin I [[id number]] Collected:  02/04/17 0910    Specimen:  Blood Updated:  02/04/17 0941  Troponin I 0.02 ng/mL     CBC with differential [[id number]]  (Abnormal) Collected:  02/04/17 0910    Specimen:  Blood from Blood Updated:  02/04/17 0919     WBC 6.16 x10 3/uL      Hgb 13.1 g/dL      Hematocrit 52.8 %      Platelets 304 x10 3/uL      RBC 4.65 x10 6/uL      MCV 88.0 fL      MCH 28.2 pg      MCHC 32.0 g/dL      RDW 17 (H) %      MPV 9.2 (L) fL      Neutrophils 77.5 %      Lymphocytes Automated 13.3 %      Monocytes 5.7 %      Eosinophils Automated 2.1 %      Basophils Automated 0.6 %      Immature Granulocyte 0.8 %      Nucleated RBC 0.0 /100 WBC      Neutrophils Absolute 4.77 x10 3/uL      Abs Lymph Automated 0.82 x10 3/uL      Abs  Mono Automated 0.35 x10 3/uL      Abs Eos Automated 0.13 x10 3/uL      Absolute Baso Automated 0.04 x10 3/uL      Absolute Immature Granulocyte 0.05 x10 3/uL      Absolute NRBC 0.00 x10 3/uL           Radiology Results (24 Hour)     Procedure Component Value Units Date/Time    CT Head without Contrast [[id number]] Collected:  02/04/17 1112    Order Status:  Completed Updated:  02/04/17 1119    Narrative:       HISTORY: Tremors.    COMPARISON: 03/25/2016    TECHNIQUE: Noncontrast CT imaging of the head was performed.    The following dose reduction techniques were utilized: automated  exposure control and/or adjustment of the mA and/or kV according to  patient size, and the use of iterative reconstruction technique.    FINDINGS: There is no hemorrhage, edema, mass effect, midline shift or  hydrocephalus. There are chronic ischemic changes, volume loss and  atherosclerosis. The basilar cisterns are clear. There is no significant  mastoid or paranasal sinus disease.      Impression:         1. No acute intracranial abnormality is detected. The radiographic  findings discussed above appear chronic.    Otho Ket, MD   02/04/2017 11:15 AM    CT Lumbar Spine WO Contrast [[id number]] Collected:  02/04/17 0931    Order Status:  Completed Updated:  02/04/17 0948    Narrative:       HISTORY: Posttraumatic pain    COMPARISON: None    TECHNIQUE: Noncontrast CT imaging of the lumbar spine was performed in  the axial plane. Sagittal and coronal reformatted images were provided.    The following dose reduction techniques were utilized: automated  exposure control and/or adjustment of the mA and/or kV according to  patient size, and the use of iterative reconstruction technique.      FINDINGS: There are surgical changes related to posterior fusion at L4,  L5 and S1. There are bilateral pedicle screws affixed by vertical rods.  The surgical hardware appears intact. No acute fracture, loss of height  or traumatic malalignment is  seen. Mild loss of height along the L1  superior endplate  appears chronic associated with Schmorl's node  formation. There is a mild leftward curvature. There is a minimal grade  1 retrolisthesis at L3-L4 and minimal grade 1 anterolisthesis at L4-L5.  There are diffuse degenerative changes. Disc space narrowing is most  prominent at L5-S1. There are vascular calcifications.      Impression:         1. No acute process is detected.    Otho Ket, MD   02/04/2017 9:44 AM    XR Chest  AP Portable [[id number]] Collected:  02/04/17 0919    Order Status:  Completed Updated:  02/04/17 0924    Narrative:       Clinical History: Shortness of breath.    Findings: AP view of the chest. Compared with prior studies including  01/06/2017.    Calcification and mild tortuosity of the thoracic aorta. Cardiac  silhouette is within normal limits for AP technique an stable. Pulmonary  vascularity is normal.     Lungs are hypoinflated. Stable milder opacity along left heart border  corresponding to mildly prominent epicardial fat pad and mild scarring  or atelectasis on prior CT of 02/26/2016. No focal consolidation is  identified. Mild rightward curvature in the thoracic spine.      Impression:        No evidence of acute cardiopulmonary disease.    Darra Lis, MD   02/04/2017 9:20 AM                              Procedures    Clinical Impression & Disposition     Clinical Impression  Final diagnoses:   Generalized weakness   Unable to ambulate        ED Disposition     ED Disposition Condition Date/Time Comment    Admit  Thu Feb 04, 2017 11:57 AM            New Prescriptions    No medications on file                 Helayne Seminole, MD  02/04/17 1159

## 2017-02-04 NOTE — Progress Notes (Signed)
Pnt is being admitted for OBS tonight.  CM discussed w pnt and daughter option of SNF and they are agreeable to referrals made to preferred network, referrals made via [facility] awaiting bed offers.  Pnt and daughter expressing concerns re: pain management and they are interested in palliative care consult while in hospital, CM made note to MD to consider.  CM will cont to follow for d/c needs.

## 2017-02-04 NOTE — H&P (Addendum)
Internal Medicine H & P      Patient: Rachael [name]  Date: 02/04/2017   DOB: 23-Dec-1935  Admission Date: 02/04/2017   MRN: [id number]  Attending: Carmelia Bake, MD      CODE STATUS: full code    PRIMARY CARE MD: Carmelia Bake, MD    Chief Complaint   Patient presents with   . Generalized weakness   . Nausea      History Gathered From: Self and Adult Child    HISTORY AND PHYSICAL   CHIEF COMPLAINT OF : Back pain, weakness and gait dysfunction    HISTORY OF PRESENT ILLNESS:  [name] is a 81 y.o. female with history of chronic obstructive pulmonary disease with previous hospitalizations.  She has 20+ pack year smoking history, quit smoking many years ago.  Also has been exposed to wood-burning stove in the past.  Patient uses oxygen at nighttime only.  She also has diagnosis of severe OSA with  On CPAP of 9 cm H2O with good compliance and also uses 2 L of oxygen at nighttime. She also has history of CAD, Back pain, Type 2 DM, Hypertension, GERD and hyperlipidemia. She presented to ER with c/o back spasms and difficulty ambulation for 3 days. CT L spine and CT Head were unremarkable. K 3.3. Rest labs were unremarkable. She denies any focal weakness or numbness anywhere            Past Medical History:   Diagnosis Date   . Anxiety    . Arthritis    . 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 Chronic cholecystitis with calculus 10/31/2015   . Chronic obstructive pulmonary disease    . Gastroesophageal reflux disease    . Hiatal hernia    . Hyperlipidemia    . Hypertension    . Hypothyroidism    . Incontinent of urine 2014    -WEARS DIAPERS-WILL BRING HER OWN   . Low back pain    . Lymphedema of right lower extremity    . PMR (polymyalgia rheumatica) 01/07/2017   . Polymyalgia rheumatica 09/2014   . Sleep apnea     USES CPAP NIGHTLY   . Type 2 diabetes mellitus, controlled    . Type II or unspecified type diabetes mellitus without mention of complication, not stated as uncontrolled    . Venous  insufficiency of right leg 2016    also has lymphedema-KEEPS HER LEGS WRAPPED EXCEPT FOR WHEN SHE BATHES       Past Surgical History:   Procedure Laterality Date   . BACK SURGERY  2014   . CARPAL TUNNEL RELEASE Bilateral 1978   . HYSTERECTOMY  1985   . ROBOTIC, CHOLECYSTECTOMY N/A 10/31/2015    Procedure: ROBOT ASSISTED, LAPAROSCOPIC, CHOLECYSTECTOMY, UMBILICAL HERNIA REPAIR;  Surgeon: Alen Blew, MD;  Location: [facility];  Service: General;  Laterality: N/A;       Prior to Admission medications    Medication Sig Start Date End Date Taking? Authorizing Provider   albuterol-ipratropium (DUO-NEB) 2.5-0.5(3) mg/3 mL nebulizer Take 3 mLs by nebulization every 4 (four) hours. 01/11/17  Yes Carmelia Bake, MD   dilTIAZem (CARDIZEM CD) 300 MG 24 hr capsule Take 1 capsule (300 mg total) by mouth daily. 01/12/17  Yes Carmelia Bake, MD   DULoxetine (CYMBALTA) 60 MG capsule Take 60 mg by mouth 2 (two) times daily.   Yes [provider]   gabapentin (NEURONTIN) 300 MG  capsule Take 900 mg by mouth 2 (two) times daily.       Yes [provider]   guaiFENesin (MUCINEX) 600 MG 12 hr tablet Take 1 tablet (600 mg total) by mouth every 12 (twelve) hours.  Patient taking differently: Take 600 mg by mouth daily.     04/01/16  Yes Brynda Rim, MD   hydrALAZINE (APRESOLINE) 25 MG tablet Take 1 tablet by mouth 2 (two) times daily. 01/05/17  Yes [provider]   ipratropium (ATROVENT) 0.02 % nebulizer solution Take 500 mcg by nebulization 4 (four) times daily.     01/01/17  Yes [provider]   lactobacillus/streptococcus (RISAQUAD) Cap Take 1 capsule by mouth daily. 04/01/16  Yes Brynda Rim, MD   levothyroxine (SYNTHROID, LEVOTHROID) 50 MCG tablet Take 50 mcg by mouth Once a day at 6:00am.   Yes [provider]   losartan-hydrochlorothiazide (HYZAAR) 100-25 MG per tablet Take 1 tablet by mouth every morning.   Yes [provider]   metFORMIN (GLUCOPHAGE-XR)  750 MG 24 hr tablet Take 750 mg by mouth nightly.   Yes [provider]   metoprolol succinate XL (TOPROL-XL) 25 MG 24 hr tablet Take 50 mg by mouth daily.       Yes [provider]   pantoprazole (PROTONIX) 40 MG tablet Take 40 mg by mouth daily.    08/06/15  Yes [provider]   Pitavastatin Calcium (LIVALO) 2 MG Tab Take 1 tablet by mouth nightly.   Yes [provider]   polyethylene glycol (MIRALAX) packet Take 17 g by mouth daily as needed (Constipation). 04/01/16  Yes Brynda Rim, MD   potassium chloride (K-TAB,KLOR-CON) 10 MEQ tablet Take 10 mEq by mouth daily.    07/17/15  Yes [provider]   predniSONE (DELTASONE) 5 MG tablet Take 5 mg by mouth daily.   Yes [provider]   SYMBICORT 160-4.5 MCG/ACT inhaler Inhale 2 puffs into the lungs 2 (two) times daily.    07/24/15  Yes [provider]   traMADol (ULTRAM) 50 MG tablet TAKE 1 TABLET BY MOUTH TWICE DAILY AS NEEDED FOR PAIN 09/12/15  Yes [provider]   traZODone (DESYREL) 100 MG tablet Take 50 mg by mouth nightly.       Yes [provider]   VICTOZA 18 MG/3ML injection inject 1.2 milligram subcutaneously daily 12/03/16  Yes [provider]   cefuroxime (CEFTIN) 250 MG tablet Take 1 tablet (250 mg total) by mouth 2 (two) times daily. 01/12/17 02/04/17  Carmelia Bake, MD   lactulose (CHRONULAC) 10 GM/15ML solution Take 15 mLs (10 g total) by mouth daily as needed (Constipation). 04/01/16 02/04/17  Brynda Rim, MD   LIRAGLUTIDE SC Inject 1.2 Units into the skin.      02/04/17  [provider]   predniSONE (DELTASONE) 20 MG tablet 2 tabs for 4 days, 1 tab for 4 days, 0.5 tab for 4 days 01/12/17 02/04/17  Carmelia Bake, MD       Allergies   Allergen Reactions   . Tolectin [Tolmetin] Swelling   . Lyrica [Pregabalin]    . Latex Itching       History reviewed. No pertinent family history.    Social History   Substance Use Topics   . Smoking status: Former  Smoker     Packs/day: 1.00     Years: 25.00     Quit date: 11/16/1998   . Smokeless tobacco: Never  Used   . Alcohol use 1.8 oz/week     3 Glasses of wine per week      Comment: social       FAMILY HISTORY    REVIEW OF SYSTEMS      Review of Systems - Negative except weakness, difficulty ambulation, back pain     PHYSICAL EXAM     Vital Signs (most recent): BP 117/69   Pulse (!) 105   Temp 97 F (36.1 C)   Resp 16   Ht 1.473 m (4\' 10" )   Wt 75.8 kg (167 lb 1.7 oz)   SpO2 92%   BMI 34.93 kg/m   Constiutional: No apparent distress. Patient speaks full sentences.   HEENT: NC/AT, PERRL, no scleral icterus or conjunctival pallor, no nasal discharge, hearing is intact. MMM, oropharynx without erythema or exudate  Cardiovascular: RRR, normal S1 S2, no murmurs, gallops, palpable thrills, no JVD    Respiratory: No increased work of breathing. Clear to auscultation and percussion bilaterally.  Gastrointestinal: +BS, non-distended, soft, non-tender, no rebound or guarding, no hepatosplenomegaly  Genitourinary: no suprapubic or costovertebral angle tenderness  Musculoskeletal: ROM grossly normal.   Skin: no rashes, jaundice or other lesions  Neurologic: EOMI, CN 2-12 grossly intact. no gross motor or sensory deficits  symmetric, downward plantar reflexes  Psychiatric: alert, interactive, appropriate, abnormal affect  Lymphatic system: no lymph nodes enlarged    LABS & IMAGING     Recent Results (from the past 24 hour(s))   ECG 12 Lead    Collection Time: 02/04/17  8:57 AM   Result Value Ref Range    Ventricular Rate 107 BPM    Atrial Rate 107 BPM    P-R Interval 144 ms    QRS Duration 60 ms    Q-T Interval 324 ms    QTC Calculation (Bezet) 432 ms    P Axis 44 degrees    R Axis 34 degrees    T Axis 129 degrees   Comprehensive metabolic panel    Collection Time: 02/04/17  9:10 AM   Result Value Ref Range    Glucose 190 (H) 70 - 100 mg/dL    BUN 16.1 7.0 - 09.6 mg/dL    Creatinine 1.0 0.6 - 1.0 mg/dL    Sodium 045 (L) 409 -  145 mEq/L    Potassium 3.8 3.5 - 5.1 mEq/L    Chloride 94 (L) 100 - 111 mEq/L    CO2 26 22 - 29 mEq/L    Calcium 10.3 (H) 7.9 - 10.2 mg/dL    Protein, Total 7.0 6.0 - 8.3 g/dL    Albumin 3.4 (L) 3.5 - 5.0 g/dL    AST (SGOT) 33 5 - 34 U/L    ALT 33 0 - 55 U/L    Alkaline Phosphatase 70 37 - 106 U/L    Bilirubin, Total 0.6 0.2 - 1.2 mg/dL    Globulin 3.6 2.0 - 3.6 g/dL    Albumin/Globulin Ratio 0.9 0.9 - 2.2    Anion Gap 14.0 5.0 - 15.0   CBC with differential    Collection Time: 02/04/17  9:10 AM   Result Value Ref Range    WBC 6.16 3.50 - 10.80 x10 3/uL    Hgb 13.1 12.0 - 16.0 g/dL    Hematocrit 81.1 91.4 - 47.0 %    Platelets 304 140 - 400 x10 3/uL    RBC 4.65 4.20 - 5.40 x10 6/uL    MCV 88.0 80.0 - 100.0 fL  MCH 28.2 28.0 - 32.0 pg    MCHC 32.0 32.0 - 36.0 g/dL    RDW 17 (H) 12 - 15 %    MPV 9.2 (L) 9.4 - 12.3 fL    Neutrophils 77.5 None %    Lymphocytes Automated 13.3 None %    Monocytes 5.7 None %    Eosinophils Automated 2.1 None %    Basophils Automated 0.6 None %    Immature Granulocyte 0.8 None %    Nucleated RBC 0.0 0.0 - 1.0 /100 WBC    Neutrophils Absolute 4.77 1.80 - 8.10 x10 3/uL    Abs Lymph Automated 0.82 0.50 - 4.40 x10 3/uL    Abs Mono Automated 0.35 0.00 - 1.20 x10 3/uL    Abs Eos Automated 0.13 0.00 - 0.70 x10 3/uL    Absolute Baso Automated 0.04 0.00 - 0.20 x10 3/uL    Absolute Immature Granulocyte 0.05 0 x10 3/uL    Absolute NRBC 0.00 0 x10 3/uL   Troponin I    Collection Time: 02/04/17  9:10 AM   Result Value Ref Range    Troponin I 0.02 0.00 - 0.09 ng/mL   B-type Natriuretic Peptide    Collection Time: 02/04/17  9:10 AM   Result Value Ref Range    B-Natriuretic Peptide 47.9 0.0 - 100.0 pg/mL   GFR    Collection Time: 02/04/17  9:10 AM   Result Value Ref Range    EGFR 53.5    UA with reflex to micro (pts  3 + yrs)    Collection Time: 02/04/17  9:47 AM   Result Value Ref Range    Urine Type Catheterized, I     Color, UA Yellow Clear - Yellow    Clarity, UA Cloudy (A) Clear - Hazy    Specific  Gravity UA 1.013 1.001 - 1.035    Urine pH 5.0 5.0 - 8.0    Leukocyte Esterase, UA Negative Negative    Nitrite, UA Negative Negative    Protein, UR Negative Negative    Glucose, UA Negative Negative    Ketones UA Negative Negative    Urobilinogen, UA Normal 0.2 - 2.0 mg/dL    Bilirubin, UA Negative Negative    Blood, UA Negative Negative   Comprehensive metabolic panel    Collection Time: 02/04/17  4:34 PM   Result Value Ref Range    Glucose 212 (H) 70 - 100 mg/dL    BUN 44.0 7.0 - 34.7 mg/dL    Creatinine 0.8 0.6 - 1.0 mg/dL    Sodium 425 956 - 387 mEq/L    Potassium 3.3 (L) 3.5 - 5.1 mEq/L    Chloride 95 (L) 100 - 111 mEq/L    CO2 31 (H) 22 - 29 mEq/L    Calcium 9.6 7.9 - 10.2 mg/dL    Protein, Total 5.7 (L) 6.0 - 8.3 g/dL    Albumin 3.2 (L) 3.5 - 5.0 g/dL    AST (SGOT) 22 5 - 34 U/L    ALT 30 0 - 55 U/L    Alkaline Phosphatase 62 37 - 106 U/L    Bilirubin, Total 0.4 0.2 - 1.2 mg/dL    Globulin 2.5 2.0 - 3.6 g/dL    Albumin/Globulin Ratio 1.3 0.9 - 2.2    Anion Gap 10.0 5.0 - 15.0   GFR    Collection Time: 02/04/17  4:34 PM   Result Value Ref Range    EGFR >60.0    Glucose Whole Blood -  POCT    Collection Time: 02/04/17  6:10 PM   Result Value Ref Range    POCT - Glucose Whole blood 202 (H) 70 - 100 mg/dL       MICROBIOLOGY:  Blood Culture: not done  Urine Culture: not done  Antibiotics Started: None    IMAGING:  CT Head without Contrast   Final Result      1. No acute intracranial abnormality is detected. The radiographic   findings discussed above appear chronic.      Otho Ket, MD    02/04/2017 11:15 AM      CT Lumbar Spine WO Contrast   Final Result      1. No acute process is detected.      Otho Ket, MD    02/04/2017 9:44 AM      XR Chest  AP Portable   Final Result    No evidence of acute cardiopulmonary disease.      Darra Lis, MD    02/04/2017 9:20 AM      MRI Brain WO Contrast    (Results Pending)   Echocardiogram 2D complete    (Results Pending)       CARDIAC:  EKG Interpretation:   sinus rhythm    Markers:    Recent Labs  Lab 02/04/17  0910   Troponin I 0.02       EMERGENCY DEPARTMENT COURSE:  Orders Placed This Encounter   Procedures   . XR Chest  AP Portable   . CT Lumbar Spine WO Contrast   . CT Head without Contrast   . MRI Brain WO Contrast   . Comprehensive metabolic panel   . CBC with differential   . Troponin I   . B-type Natriuretic Peptide   . UA with reflex to micro (pts  3 + yrs)   . GFR   . Comprehensive metabolic panel   . Lipid panel (Fasting)   . CBC   . Hemoglobin A1c   . GFR   . Diet cardiac   . Cardiac monitoring (ED Only)   . Oxygen:  Nasal Cannula < 5 lpm (low flow)   . Continuous Pulse Oximetry   . Dysphagia Screen   . Nursing Stroke Protocol and NIHSS   . Cardiac monitoring (ED ONLY)   . Vital signs with pulse ox (Q4H)   . Up as tolerated   . Place sequential compression device   . Maintain sequential compression device   . Notify physician (specify)   . Document NIH Stroke Score   . Nursing swallow assessment   . Neuro checks   . Arrange for a sleep study   . Nasal Cannula Low-Flow (5 LPM or less)   . Daily Blood Pressure Goals   . Education: Stroke   . Telemetry Monitoring Continuous   . Notify Physician (Critical Blood Glucose Value)   . Notify physician (Communication: Document Abnormal Blood Glucose)   . Target Glucose Goals   . Nursing communication   . Nursing communication: Adult Hypoglycemia Treatment Algorithm   . NSG Communication: Glucose POCT order (PRN hypoglycemia)   . NSG Communication: Glucose POCT order (AC and HS)   . Full Code   . OT eval and treat   . PT EVALUATE AND TREAT   . Resp Re-Assess Adult (RT Use Only)   . Nebulizer treatment intermittent   . MDI/DPI   . Glucose Whole Blood - POCT   . ECG 12 Lead   .  EKG SCAN   . Echocardiogram 2D complete   . Insert peripheral IV   . Place (admit) for Observation Services   . Iron ED Bed Request (Observation)   . Supervise For Meals Frequency: All meals       ASSESSMENT & PLAN     Aalaya Yadao  is a 82 y.o. female with above PMH and PSH who is being admitted under OBSERVATION with weakness generalized, back pain.    Patient Active Problem List    Diagnosis Date Noted   . Generalized weakness 02/04/2017   . Hypoxia 01/07/2017   . Hyponatremia 01/07/2017   . Leukocytosis 01/07/2017   . Anxiety 01/07/2017   . GERD (gastroesophageal reflux disease) 01/07/2017   . HLD (hyperlipidemia) 01/07/2017   . HTN (hypertension) 01/07/2017   . Hypothyroid 01/07/2017   . PMR (polymyalgia rheumatica) 01/07/2017   . OSA (obstructive sleep apnea) 01/07/2017   . Diabetes mellitus, type II 01/07/2017   . Sepsis, due to unspecified organism    . Acute bronchitis due to Rhinovirus 03/29/2016   . COPD exacerbation 03/25/2016   . Acute respiratory failure with hypoxia 03/25/2016   . Lactic acidosis 03/25/2016       IMPRESSION AND PLAN:    Generalized weakness with difficulty ambulation. CT Head is negative. MRI Brain ordered. Stroke protocol initiated. Continue aspirin 81 mg daily. Neuro checks per protocol   Back pain and spasms. PRN Norco. Heating pads. CT L spine is negative   Recent admission with COPD exacerbation improved.  Continue Duonebs scheduled. Resume oxygen during  the night time. Resume Breo   OSA. CPAP at HS ordered   Hypertension. Stable. Resume home meds   Type 2 DM, Uncontrolledon Lantus Insulin and SSI. Increase Lantus to 15units daily. Add pre-meal Humalog TID AC   PMR on Cymbalta 60 mg BID   Nocturnal Hypoxia on nighttime Oxygen.    Hyperlipidemia   DVT/VTE Prophylaxis  SCD's & Lovenox 40 mg subQ daily    Signed,  Carmelia Bake, MD  02/04/2017 7:47 PM    CC: PCP and all specialists listed above

## 2017-02-04 NOTE — UM Notes (Signed)
ED review completed. Incomplete review due to lack of H&P.  80y/o female came into ER on 3/22 and admitted OBS to medical on same day   Pnt w lower back pain with bilateral leg pain occurring in spasms intermittently over the last 3-4 days.  States pain is typically worse at night with associated nausea.  Has a history of chronic obstructive pulmonary disease and was post reduction last episode did not take it last night and was found to be having low oxygen levels at 84 percent when EMS arrived.      Past Medical History:   Diagnosis Date   . Anxiety    . Arthritis    . 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 Chronic cholecystitis with calculus 10/31/2015   . Chronic obstructive pulmonary disease    . Gastroesophageal reflux disease    . Hiatal hernia    . Hyperlipidemia    . Hypertension    . Hypothyroidism    . Incontinent of urine 2014    -WEARS DIAPERS-WILL BRING HER OWN   . Low back pain    . Lymphedema of right lower extremity    . PMR (polymyalgia rheumatica) 01/07/2017   . Polymyalgia rheumatica 09/2014   . Sleep apnea     USES CPAP NIGHTLY   . Type 2 diabetes mellitus, controlled    . Type II or unspecified type diabetes mellitus without mention of complication, not stated as uncontrolled    . Venous insufficiency of right leg 2016    also has lymphedema-KEEPS HER LEGS WRAPPED EXCEPT FOR WHEN SHE BATHES     VS: 98.6; p105; 93%2L; r20; 140/64; 9/10 pain     ER meds  IV zofran   IV morphine   PO aspirin     Abn Labs  u-cloudy   Glu 190  NA 134   Cl 94   CA 10.3   Alb 3.4  CT head: 1. No acute intracranial abnormality is detected. The radiographic  findings discussed above appear chronic.  CT Lspine: no acute abn   CXR: no acute abn   SINUS TACHYCARDIA  ST & T WAVE ABNORMALITY, CONSIDER ANTEROLATERAL ISCHEMIA  ABNORMAL ECG  WHEN COMPARED WITH ECG OF 25-Mar-2016 15:10,  SINUS RHYTHM HAS REPLACED JUNCTIONAL RHYTHM  INVERTED T WAVES HAVE REPLACED NONSPECIFIC T WAVE ABNORMALITY IN Anterolateral  leads    Pnt being admitted OBS to medical for   Sq lovenox  ECHO  MRI angio head/neck  MRI brain   NPO   Telemetry monitoring   Vs  w neuro q4   PT/OT

## 2017-02-04 NOTE — Plan of Care (Signed)
Problem: Safety  Goal: Patient will be free from injury during hospitalization  Outcome: Progressing   02/04/17 2008   Goal/Interventions addressed this shift   Patient will be free from injury during hospitalization  Assess patient's risk for falls and implement fall prevention plan of care per policy;Provide and maintain safe environment;Use appropriate transfer methods;Ensure appropriate safety devices are available at the bedside;Include patient/ family/ care giver in decisions related to safety;Hourly rounding;Assess for patients risk for elopement and implement Elopement Risk Plan per policy;Provide alternative method of communication if needed (communication boards, writing)       Problem: Day of Admission - Stroke  Goal: Core/Quality measure requirements - Admission  Outcome: Progressing   02/04/17 2008   Goal/Interventions addressed this shift   Core/Quality measure requirements - Admission Document nursing swallow/dysphagia screen on admission. If patient fails, keep patient NPO (follow your hospital protocol on swallowing screening).;VTE Prevention: Ensure anticoagulant(s) administered and/or anti-embolism stockings/devices documented as ordered;Document NIH Stroke Scale on admission;If diagnosis or history of Atrial Fib/Atrial Flutter, ensure oral anticoagulation is initiated or contraindication documented by LIP;Begin stroke education on admission (must include Modifiable Risk Factors, Warning Signs and Symptoms of Stroke, Activation of Emergency Medical System and Follow-up Appointments) Ensure handout has been given and documented.;Ensure PT/OT and/or SLP ordered;Ensure antithrombotic administered or contraindication documented by LIP;Ensure lipid panel ordered       Problem: Every Day - Stroke  Goal: Neurological status is stable or improving  Outcome: Progressing   02/04/17 2008   Goal/Interventions addressed this shift   Neurological status is stable or improving Monitor/assess/document  neurological assessment (Stroke: every 4 hours);Monitor/assess NIH Stroke Scale;Re-assess NIH Stroke Scale for any change in status;Observe for seizure activity and initiate seizure precautions if indicated;Perform CAM Assessment     Goal: Stable vital signs and fluid balance  Outcome: Progressing   02/04/17 2008   Goal/Interventions addressed this shift   Stable vital signs and fluid balance  Position patient for maximum circulation/cardiac output;Monitor and assess vitals every 4 hours or as ordered and hemodynamic parameters;Monitor intake and output. Notify LIP if urine output is < 30 mL/hour.;Encourage oral fluid intake;Apply telemetry monitor as ordered     Goal: Patient will maintain adequate oxygenation  Outcome: Progressing   02/04/17 2008   Goal/Interventions addressed this shift   Patient will maintain adequate oxygenation  Suction secretions as needed;Maintain SpO2 of greater than 92%;Sleep Apnea: Assess if previously tested or on CPAP;Sleep Apnea: Encourage patient to speak to PCP regarding sleep apnea/sleep study ([place] only)     Goal: Patient's risk of aspiration will be minimized  Outcome: Progressing   02/04/17 2008   Goal/Interventions addressed this shift   Patient's risk of aspiration will be minimized Complete new dysphagia screen for any change in status: Keep patient NPO if patient fails;Place swallow precaution signage above bed;Monitor/assess for signs of aspiration (tachypnea, cough, wheezing, clearing throat, hoarseness after eating, decrease in SaO2;Assess and monitor ability to swallow;Keep head of bed up a minimum of 30 degrees when hemodynamically stable;HOB up 90 degrees to eat if unable to be OOB;Place patient up in chair to eat, if possible;Instruct patient to take small single sips of liquid     Goal: Will be able to express needs and understand communication  Outcome: Progressing   02/04/17 2008   Goal/Interventions addressed this shift   Able to express needs and understand  communication  Provide alternative method of communication if needed;Consult/collaborate with Case Management/Social Work;Include patient care companion in decisions related to communication;Patient/patient  care companion demonstrates understanding on disease process, treatment plan, medications and discharge plan

## 2017-02-04 NOTE — Progress Notes (Signed)
.  [email or web address] Program    Education about New Medications and their Side effects    Dear Kathryne Hitch Aldaba,    Its been a pleasure taking care of you during your hospitalization here at [facility]. We have initiated a new program to educate our patients and/or their family members or designated personnel about the new medications started by your physicians and their indications along with the possible side effects. Multiple studies have shown that patients started on new medications are often unaware of the names of the medication along with the indications and their side effects which leads to decreased compliance with the medications.    During our conversation today on 02/04/2017  8:35 PM I have explained to you the name of the new medication and the indication along with some possible common side effects. Listed below are some of the new medications started during this hospitalization.     Please call the Nurse if you have any side effects while in hospital.     Please call 911 if you have any life threatening symptoms after you are discharged from the hospital.    Please inform your Primary care physician for common side effects which are not life threatening after discharge.    Medication Name: Ipratropium(Atrovent)   This Medication is used for:   COPD   Asthma   Bronchospasm    Common Side Effects are:   Dry mouth   Headache   Nausea   High heart rate    A note from your nurse:  Call your nurse immediately if you notice itching, hives, swelling or trouble breathing     Thank you for your time.    Tomasa Hosteller, RT  02/04/2017  8:35 PM  [facility]  [address]  [place], [place]  [address]

## 2017-02-04 NOTE — ED Notes (Signed)
Ambulated pt, pt walked with assistance, was able to shuffle to bathroom. Pt stated she felt fine on her feet, but her legs were wobbly and felt a little weak. Pt needed wheelchair to get back to room from bathroom.

## 2017-02-04 NOTE — Progress Notes (Addendum)
02/04/17 1127   Patient Type   Within 30 Days of Previous Admission? Yes  (IP 2/22 to 2/27)   Healthcare Decisions   Interviewed: Patient;Family   Interviewee Contact Information: Sharmaine Base, daughter    Orientation/Decision Making Abilities of Patient Alert and Oriented x3, able to make decisions   Advance Directive Patient has advance directive, copy not in chart   Advance Directive not in Chart Copy requested from family/decision maker   Healthcare Agent Appointed Other (Comment)  (Pnt can not remember who she designated as her POA at this time. )   Healthcare Agent's Name Pnt can not recall    Additional Emergency Contacts? Sharmaine Base, daughter [phone]; Elyn Peers, son in [place] [phone]   Prior to admission   Prior level of function Independent with ADLs;Ambulates with assistive device  (Pnt reports being independent in all spheres including driving.  Pnt uses walker for long distance and cane for short.  She has not been driving recently.  In last 24h she has had significant change in her ambulation, shaking, weaker, not able to walk. )   Type of Residence Private residence   Home Layout Two level;Performs ADL's on one level  (2 levels w basement.  Pnt is in basement w bedroom/full bath, refridgerator.  Pnt has chair lift to 2nd level and 2 steps to where her car is located. )   Have running water, electricity, heat, etc? Yes   Living Arrangements Children  (Pnt lives w daughter, Ronnald Nian, and 2 teenage grandchn.  Both daughter and snl work in [place] so gone during day. )   How do you get to your MD appointments? self or daughter    How do you get your groceries? self or daughter    Who fixes your meals? daughter    Who does your laundry? self or daughter    Who picks up your prescriptions? self or daughter   Dressing Independent   Grooming Independent   Feeding Independent   Bathing Needs assistance  (According to daughter pnt has been afraid to take showers on her own.  )   Toileting  Independent   DME Currently at Home Single point cane;Front wheel walker;Home O2;Other (Comment);[employer]  ([employer] for Home O2, CPAP, Nebulizer, Shower/Bathtub chair )   Home Care/Community Services Homehealth aide  (Pnt just recently finished HH nurse, PT, OT last week with [facility] )   Name of Agency [facility] [place] just started on Tuesday, 3/20   Frequency of services Tuesday and Thursday for 4 hours.  They are assisting pnt w ADLs such as showers/bathing   Prior SNF admission? (Detail) No.  Pnt has had a 3PM [place] stay in hospital so would be eligible for SNF.  CM briefly explned benefits and options, pnt and daughter considering this option.  Pnt does have a LTC insurance that activates after 60 days of them paying for services.    Prior Rehab admission? (Detail) NA    Adult Protective Services (APS) involved? No   Discharge Planning   Support Systems Children;Home care staff  (Pnt reports good family support system w her 4 children and their families.  2 of her chn are in [place]. )   Patient expects to be discharged to: Home w [facility] and current private aide service vs SNF    Anticipated [place] plan discussed with: Same as interviewed   Potential barriers to discharge: Decreased mobility   Mode of transportation: Other  (private car vs w/c  Zenaida Niece )   Consults/Providers   PT Evaluation Needed 1   OT Evalulation Needed 1   SLP Evaluation Needed 2   Outcome Palliative Care Screen Screened but did not meet criteria for intervention   Correct PCP listed in Epic? Yes   Important Message from [facility] Notice   Patient received 1st IMM Letter? 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  The other day they planned an epidural but unable to do because her blood sugars were high.  Their next apptmt is next Friday but they were able to get an apptmt for tomorrow if able to go but daughter is concerned because pnt was not  walking in last 24 hours.  They have been working w PCP in re: to pnt's increase in nausea since last hospitalization.

## 2017-02-05 ENCOUNTER — Observation Stay: Payer: No Typology Code available for payment source

## 2017-02-05 LAB — LIPID PANEL
Cholesterol / HDL Ratio: 3.9
Cholesterol: 122 mg/dL (ref 0–199)
HDL: 31 mg/dL — ABNORMAL LOW (ref 40–9999)
LDL Calculated: 53 mg/dL (ref 0–99)
Triglycerides: 190 mg/dL — ABNORMAL HIGH (ref 34–149)
VLDL Calculated: 38 mg/dL (ref 10–40)

## 2017-02-05 LAB — CBC
Absolute NRBC: 0 10*3/uL
Hematocrit: 34 % — ABNORMAL LOW (ref 37.0–47.0)
Hgb: 11.2 g/dL — ABNORMAL LOW (ref 12.0–16.0)
MCH: 28.4 pg (ref 28.0–32.0)
MCHC: 32.9 g/dL (ref 32.0–36.0)
MCV: 86.3 fL (ref 80.0–100.0)
MPV: 8.9 fL — ABNORMAL LOW (ref 9.4–12.3)
Nucleated RBC: 0 /100 WBC (ref 0.0–1.0)
Platelets: 275 10*3/uL (ref 140–400)
RBC: 3.94 10*6/uL — ABNORMAL LOW (ref 4.20–5.40)
RDW: 16 % — ABNORMAL HIGH (ref 12–15)
WBC: 5.51 10*3/uL (ref 3.50–10.80)

## 2017-02-05 LAB — GLUCOSE WHOLE BLOOD - POCT
Whole Blood Glucose POCT: 179 mg/dL — ABNORMAL HIGH (ref 70–100)
Whole Blood Glucose POCT: 171 mg/dL — ABNORMAL HIGH (ref 70–100)
Whole Blood Glucose POCT: 196 mg/dL — ABNORMAL HIGH (ref 70–100)
Whole Blood Glucose POCT: 166 mg/dL — ABNORMAL HIGH (ref 70–100)

## 2017-02-05 LAB — HEMOLYSIS INDEX: Hemolysis Index: 2 (ref 0–18)

## 2017-02-05 NOTE — OT Eval Note (Signed)
[facility]  [address]  [place], [place]. [address]    Department of Rehabilitation Services  [phone]    Occupational Therapy Evaluation    Patient: Rachael [name]    MRN#: [id number]     [id number]/[id number]    Time of treatment: Time Calculation  OT Received On: 02/05/17  Start Time: 1405  Stop Time: 1431  Time Calculation (min): 26 min  OT Visit Number: 01    Consult received for Rachael [name] for OT Evaluation and Treatment.  Patient's medical condition is appropriate for Occupational therapy intervention at this time.    Assessment:   Rachael [name] is a 81 y.o. female admitted 02/04/2017.   Expanded chart review completed including review of labs, review of imaging and review of H&P and physician progress notes.  Pt's ability to complete ADLs and functional transfers is impaired due to the following deficits:  decreased activity tolerance, decreased balance, decreased bed mobility, decreased safety awareness, pain, ROM deficits and transfers .  Pt demonstrates performance deficits with bathing, dressing, toileting and functional mobility. There are a few comorbidities or other factors that affect plan of care and require modification of task including: assistive device needed for mobility, neuropathy, has stairs to manage and home alone for a portion of the day.  Pt would continue to benefit from OT to address these deficits and increase functional independence.    Assessment: decreased ROM;decreased strength;balance deficits;decreased independence with ADLs;decreased independence with IADLs;decreased endurance/activity tolerance     Complexity Chart Review Performance Deficits Clinical Decision Making Hx/Comorbidities Assistance needed   Low Brief 1-3 Limited options None None (or at baseline)   Moderate Expanded 3-5 Several Options 1-2 Min/Mod assist (not at baseline)   High Extensive 5 or more Multiple options 3 or more Max/dependent (not at baseline     Therapy Diagnosis:  generalized weakness, decreased functional mobility , decreased independence with ADL's and increased gait dysfunction due to HPI and prior level of function. Without therapy interventions, patient is at risk for falls, dependence on caregivers for mobility, dependence on caregivers for ADL's, decreased independence and failure to return to PLOF.    Rehabilitation Potential: Prognosis: Good;With continued OT s/p acute discharge      Plan:   OT Frequency Recommended: 2-3x/wk   Treatment Interventions: ADL retraining;Functional transfer training;UE strengthening/ROM;Endurance training;Patient/Family training;Compensatory technique education;Equipment eval/education          Risks/Benefits/POC Discussed with Pt/Family: With patient    Goals:   Goal Formulation: Patient  Time For Goal Achievement: 3 visits  ADL Goals  Patient will groom self: Modified Independent;with AE;at sinkside  Patient will dress upper body: Modified Independent  Patient will dress lower body: Minimal Assist;with AE  Patient will toilet: Modified Independent;with AE  Mobility and Transfer Goals  Pt will transfer bed to toilet: Modified Independent;with rolling walker      Discharge Recommendations:   Based on today's session patient's discharge recommendation is the following: Discharge Recommendation: Other (Comment) (inpatient rehab).     If Discharge Recommendation: Other (Comment) (inpatient rehab) is not available, then the patient will need home health services, increase supervision , assistance with mobility, assistance with ADL's and assistance with IADL's.    DME Recommended for Discharge: Other (Comment) (raised toilet seat with arms)      Precautions and Contraindications: Falls  Precautions  Weight Bearing Status: no restrictions      Medical Diagnosis: Unable to ambulate [R26.2]  Generalized weakness [R53.1]    History of Present  Illness: Rachael [name] is a 81 y.o. female admitted on 02/04/2017 with "81 y.o. female with history  of chronic obstructive pulmonary disease with previous hospitalizations. She has 20+ pack year smoking history, quit smoking many years ago. Also has been exposed to wood-burning stove in the past. Patient uses oxygen at nighttime only. She also has diagnosis of severe OSA with  On CPAP of 9 cm H2O with good compliance and also uses 2 L of oxygen at nighttime. She also has history of CAD, Back pain, Type 2 DM, Hypertension, GERD and hyperlipidemia. She presented to ER with c/o back spasms and difficulty ambulation for 3 days. CT L spine and CT Head were unremarkable. K 3.3. Rest labs were unremarkable. She denies any focal weakness or numbness anywhere" - Per MD H &P     Patient Active Problem List   Diagnosis   . COPD exacerbation   . Acute respiratory failure with hypoxia   . Lactic acidosis   . Acute bronchitis due to Rhinovirus   . Sepsis, due to unspecified organism   . Hypoxia   . Hyponatremia   . Leukocytosis   . Anxiety   . GERD (gastroesophageal reflux disease)   . HLD (hyperlipidemia)   . HTN (hypertension)   . Hypothyroid   . PMR (polymyalgia rheumatica)   . OSA (obstructive sleep apnea)   . Diabetes mellitus, type II   . Generalized weakness        Past Medical/Surgical History:  Past Medical History:   Diagnosis Date   . Anxiety    . Arthritis    . 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 Chronic cholecystitis with calculus 10/31/2015   . Chronic obstructive pulmonary disease    . Gastroesophageal reflux disease    . Hiatal hernia    . Hyperlipidemia    . Hypertension    . Hypothyroidism    . Incontinent of urine 2014    -WEARS DIAPERS-WILL BRING HER OWN   . Low back pain    . Lymphedema of right lower extremity    . PMR (polymyalgia rheumatica) 01/07/2017   . Polymyalgia rheumatica 09/2014   . Sleep apnea     USES CPAP NIGHTLY   . Type 2 diabetes mellitus, controlled    . Type II or unspecified type diabetes mellitus without mention of complication, not stated as uncontrolled    .  Venous insufficiency of right leg 2016    also has lymphedema-KEEPS HER LEGS WRAPPED EXCEPT FOR WHEN SHE BATHES      Past Surgical History:   Procedure Laterality Date   . BACK SURGERY  2014   . CARPAL TUNNEL RELEASE Bilateral 1978   . HYSTERECTOMY  1985   . ROBOTIC, CHOLECYSTECTOMY N/A 10/31/2015    Procedure: ROBOT ASSISTED, LAPAROSCOPIC, CHOLECYSTECTOMY, UMBILICAL HERNIA REPAIR;  Surgeon: Alen Blew, MD;  Location: [facility];  Service: General;  Laterality: N/A;         X-Rays/Tests/Labs:  Ct Head Without Contrast    Result Date: 02/04/2017  1. No acute intracranial abnormality is detected. The radiographic findings discussed above appear chronic. Otho Ket, MD 02/04/2017 11:15 AM    Ct Lumbar Spine Wo Contrast    Result Date: 02/04/2017  1. No acute process is detected. Otho Ket, MD 02/04/2017 9:44 AM    Mri Brain Wo Contrast    Result Date: 02/05/2017  1. No acute or recent infarct is  detected. 2. Mild  subcortical, deep, and periventricular leukomalacia is seen in both cerebral hemispheres. These findings are most suggestive of mild chronic small vessel ischemic change. Theodoro Doing, MD 02/05/2017 7:33 AM    Xr Chest  Ap Portable    Result Date: 02/04/2017   No evidence of acute cardiopulmonary disease. Darra Lis, MD 02/04/2017 9:20 AM        Social History:  Prior Level of Function  Prior level of function: Ambulates with assistive device, Needs assistance with ADLs  Assistive Device: Other (Comment) (three wheeled walker)  Baseline Activity Level: Community ambulation  Driving: independent  Cooking: No  DME Currently at Home: Single point cane, Stair Glide  Home Living Arrangements  Living Arrangements: Children  Type of Home: House  Home Layout: Two level  Bathroom Shower/Tub: Cabin crew: Paediatric nurse, Grab bars in shower, Hand-held shower  Bathroom Accessibility: Accessible  DME Currently at Home: Single point cane, Stair Glide  Home Living  - Notes / Comments: Daughter and SIL work in [place] daily; cannot assist. Pt had initiated getting private duty aide 4 hrs/day 2 days a week.        Subjective:   Patient is agreeable to participation in the therapy session. Nursing clears patient for therapy.     Pain Assessment  Pain Assessment: Numeric Scale (0-10)  Pain Score: 3-mild pain  Pain Location: Back  Pain Orientation: Lower  Pain Frequency: Increases with movement;Continuous  Effect of Pain on Daily Activities: moderate  Pain Intervention(s): Medication (See eMAR);Rest.        Objective:   Observation of Patient/Vital Signs:  Patient is in bed with telemetry in place.         Cognition/Neuro Status  Arousal/Alertness: Appropriate responses to stimuli  Attention Span: Appears intact  Orientation Level: Oriented X4  Memory: Appears intact  Following Commands: independent  Safety Awareness: independent  Insights: Fully aware of deficits  Problem Solving: Able to problem solve independently  Behavior: calm;cooperative  Motor Planning: intact  Coordination: intact  Hand Dominance: right handed    Gross ROM  Right Upper Extremity ROM: within functional limits (somewhat limited by pain)  Left Upper Extremity ROM: within functional limits (somewhat limited by pain)  Gross Strength  Right Upper Extremity Strength: within functional limits  Left Upper Extremity Strength: within functional limits          Sensory  Auditory: intact  Tactile - Light Touch: intact  Visual Acuity: wears glasses       Self-care and Home Management  Grooming: Stand by Assist;standing at sink  LB Dressing: Moderate Assist  Toileting: Minimal Assist  Functional Transfers: Minimal Assist    Mobility and Transfers  Sit to Supine: Contact Guard Assist (use of bed rail; HOB elevated)  Sit to Stand: Minimal Assist;using bedrail  Bed to Toilet: Minimal Assist (with FWW)  Functional Mobility/Ambulation: Minimal Assist (with FWW)     Balance  Static Sitting Balance: Modified Independent  Dyanamic  Sitting Balance: Contact Guard Assist  Static Standing Balance: Contact Guard Assist    Participation and Endurance  Participation Effort: good  Endurance: Tolerates 10 - 20 min exercise with multiple rests    AM-PACT "6 Clicks" Daily Activity Inpatient Short Form  Inpatient AM-PACT Performed?: yes  Put On/Take Off Lower Body Clothing: A lot  Assist with Bathing: A lot  Assist with Toileting: A little  Put On/Take Off Upper Body Clothing: A little  Assist with Grooming: A little  Assist with Eating: None  OT Daily Activity Raw Score: 17  CMS 0-100% Score: 50.11%    PMP - Progressive Mobility Protocol   PMP Activity: Step 6 - Walks in Room  Distance Walked (ft) (Step 6,7): 60 Feet    Treatment Activities: Patient engaged in the therapeutic activities stated above and presents with deficits in functional mobility, transfers, and ADL performance.    Patient was educated further educated regarding home safety considerations when completing home mobility and ADL tasks; patient verbalized understanding. Patient was instructed on energy conservation strategies focusing on pacing of activities throughout the day, multiple rest breaks and prioritizing activities. Verbally instructed pt in the benefits of sitting OOB and in chair intermittently throughout the day for pulmonary hygiene as well as to increase Pt's functional endurance. Discussed home bathroom safety with recommendations made for shower seat, grab bars in shower and around toilet, raised toilet seat and for Patient to have Supervision with shower transfers upon d/c to home to ensure safety/fall prevention.    Educated the patient to role of occupational therapy, plan of care, goals of therapy and HEP, safety with mobility and ADLs, energy conservation techniques.    Rachael Grounds R Burnetta Kohls, MS, OTR/L  Occupational Therapist  [facility]  Physical Medicine and Rehabilitation

## 2017-02-05 NOTE — Plan of Care (Signed)
Problem: Safety  Goal: Patient will be free from injury during hospitalization  Outcome: Progressing   02/05/17 1152   Goal/Interventions addressed this shift   Patient will be free from injury during hospitalization  Use appropriate transfer methods;Ensure appropriate safety devices are available at the bedside;Provide and maintain safe environment;Assess patient's risk for falls and implement fall prevention plan of care per policy;Hourly rounding;Assess for patients risk for elopement and implement Elopement Risk Plan per policy       Problem: Every Day - Stroke  Goal: Core/Quality measure requirements - Daily  Outcome: Progressing   02/05/17 1152   Goal/Interventions addressed this shift   Core/Quality measure requirements - Daily  VTE Prevention: Ensure anticoagulant(s) administered and/or anti-embolism stockings/devices documented by end of day 2;Once lipid panel has resulted, check LDL. Contact provider for statin order if LDL > 70 (or ensure contraindication documented by LIP).;Ensure antithrombotic administered or contraindication documented by LIP by end of day 2;Continue stroke education (must include Modifiable Risk Factors, Warning Signs and Symptoms of Stroke, Activation of Emergency Medical System and Follow-up Appointments). Ensure handout has been given and documented.     Goal: Neurological status is stable or improving  Outcome: Progressing   02/05/17 1152   Goal/Interventions addressed this shift   Neurological status is stable or improving Monitor/assess/document neurological assessment (Stroke: every 4 hours);Monitor/assess NIH Stroke Scale;Re-assess NIH Stroke Scale for any change in status;Observe for seizure activity and initiate seizure precautions if indicated;Perform CAM Assessment

## 2017-02-05 NOTE — Consults (Signed)
Nutritional Support Services  Nutrition Assessment    Rachael [name] 81 y.o. female   MRN: [id number]    Summary of Nutrition Recommendations:    1) Change diet order to consistent CHO/Cardiac diet    2) Diet education given- heart healthy and consistent CHO diet.  RDN reinforced the need for small, frequent meals to help better control blood sugars    -----------------------------------------------------------------------------------------------------------------                                                            Assessment Data:   Referral Source: RN Screen- MST Score 3  Reason for Referral: weight loss/decreased weight    Nutrition: HA1C has increased from 7.5 to 8.5% within past year- pt state she was recently diagnosed with DM.   Pt was trying to lose weight, however, realized she was losing weight too quickly.  Pt reports taking Lantus in the morning but would forget to take her short acting insulin with her meals.  This led her to starting new medication- Victoza- that helped with weight loss and made her nauseous as well.    Events of Current Admission: Admitted with generalized weakness, r/o stroke, type 2 DM uncontrolled     Medical Hx:  has a past medical history of Anxiety; Arthritis; Bilateral cataracts (2014); Chronic cholecystitis with calculus (10/31/2015); Chronic obstructive pulmonary disease; Gastroesophageal reflux disease; Hiatal hernia; Hyperlipidemia; Hypertension; Hypothyroidism; Incontinent of urine (2014); Low back pain; Lymphedema of right lower extremity; PMR (polymyalgia rheumatica) (01/07/2017); Polymyalgia rheumatica (09/2014); Sleep apnea; Type 2 diabetes mellitus, controlled; Type II or unspecified type diabetes mellitus without mention of complication, not stated as uncontrolled; and Venous insufficiency of right leg (2016).  Social/Diet Hx: pt reports consuming ~1 meal per day      Current Diet Order      Diet cardiac   Intake: good po intake per  report    ANTHROPOMETRIC  Anthropometrics  Height: 147.3 cm (4\' 10" )  Weight: 77 kg (169 lb 12.1 oz)  Weight Change: 1.58  IBW/kg (Calculated) Female: 45.8 kg  IBW/kg (Calculated) Female: 43.3 kg  BMI (calculated): 35     Wt Readings from Last 5 Encounters:   02/05/17 77 kg (169 lb 12.1 oz)   02/04/17 75.8 kg (167 lb)   02/04/17 75.8 kg (167 lb)   02/04/17 75.8 kg (167 lb)   01/12/17 81.5 kg (179 lb 11.2 oz)     ~10 lb weight loss in 1 month (6% body weight), considered significant amount    Physical Assesment: no visible signs of muscle/fat wasting  Edema: +1 mild pitting to RLE/LLE  Skin: Intact  GI function: WDL    ESTIMATED NEEDS  Estimated Energy Needs  Total Energy Estimated Needs: ~1350 kcal/day  Method for Estimating Needs: MSJ plus AF 1.2    Estimated Protein Needs  Total Protein Estimated Needs: 65-86 grams pro/day  Method for Estimating Needs: 1.5-2 grams pro/IBW    Fluid Needs  Total Fluid Estimated Needs: ~1350 ml/day or per MD  Method for Estimating Needs: 1 ml/cal    Pertinent Medications: metformin, Lantus- 14 units in the morning    Pertinent labs: Reviewed- Potassium (3/22)= 3.3; blood sugar= 171-212  HA1C (3/22)= 8.5%    Learning Needs:  DM diet education needed- pt wanting meal plan.  RDN reviewed heart healthy eating with DM as well as ~1500 kcal 5 day meal plan     Nutrition Assessment Summary:   Change diet to consistent CHO diet, education given.  PO intake has been good per RN report since admission- likely weight loss 2/2 medication for her DM- now on different medication.                                                              Nutrition Diagnosis      Altered nutrition related labs related to DM as evidenced by HA1C= 8.5%, blood sugars= 171-212    Weight loss related to decreased appetite- pt reported nausea while on DM medication as evidenced by 10 lb weight loss within 1 month- significant amount                                                              Intervention     Goal: Blood  sugar between 80-180 within 4-6 days    1) Change diet order to consistent CHO/Cardiac diet    2) Diet education given- heart healthy and consistent CHO diet.  RDN reinforced the need for small, frequent meals to help better control blood sugars                                                             Monitoring     PO intake, blood sugars, further education needs, POC                                                        Evaluation     Goals Established     Rachael Passey L. Lenon Ahmadi, MS, RDN, LDN, [facility]  Nutrition Office x [phone]

## 2017-02-05 NOTE — PT Eval Note (Signed)
[facility]  [address]  [place], [place]. [address]    Department of Rehabilitation  [phone]    Physical Therapy Evaluation    Patient: Rachael [name]    MRN#: [id number]     [id number]    Time of treatment: Time Calculation  PT Received On: 02/05/17  Start Time: 0913  Stop Time: 0938  Time Calculation (min): 25 min    PT Visit Number: 1    Consult received for Rachael [name] for PT Evaluation and Treatment.  Patient's medical condition is appropriate for Physical therapy intervention at this time.      Assessment:   Rachael [name] is a 81 y.o. female admitted 02/04/2017.  Pt's functional mobility is impacted by:  decreased activity tolerance, gait impairment and respiratory status.  There are a few comorbidities or other factors that affect plan of care and require modification of task including: assistive device needed for mobility and home alone for a portion of the day.  Standardized tests and exams incorporated into evaluation include AMPAC mobility.  Pt demonstrates a stable clinical presentation due to no abnormal response to activity.   Pt would continue to benefit from PT to address these deficits and increase functional independence.     Complexity Level Hx and Co  morbidites Examination Clinical Decision Making Clinical Presentation   Low no impact 1-2 elements Limited options Stable   Moderate   1-2 factors 3 or more   Several options Evolving, plan may alter   High 3 or more 4 or more Multiple options Unstable, unpredictable       Impairments: Assessment: Decreased endurance/activity tolerance;Decreased LE strength;Impaired coordination;Decreased balance;Gait impairment.     Therapy Diagnosis: decreased functional mobility , decreased independence with ADL's and decreased endurance/ activity engagement due to current illness. Without therapy interventions, patient is at risk for falls, dependence on caregivers for mobility, dependence on caregivers for ADL's and  decreased quality of life.    Rehabilitation Potential: Prognosis: Fair;With continued PT status post acute discharge      Plan:    Treatment/Interventions: Gait training;Endurance training;LE strengthening/ROM;Equipment eval/education;Compensatory technique education PT Frequency: 3-4x/wk    Risks/Benefits/POC Discussed with Pt/Family: With patient          Goals:   Goals  Goal Formulation: With patient  Time for Goal Acheivement: By time of discharge  Goals: Select goal  Pt Will Sit Edge of Bed: 3-5 min;with supervision;to maximize functional mobility and independence;5 visits  Pt Will Perform Sit to Stand: to maximize functional mobility and independence;with supervision;5 visits  Pt Will Ambulate: 101-150 feet;with rolling walker;with supervision;to maximize functional mobility and independence;5 visits      Discharge Recommendations:   Based on today's session patient's discharge recommendation is the following: SNF       Precautions and Contraindications: Fall risk  Precautions  Weight Bearing Status: no restrictions    Medical Diagnosis: Unable to ambulate [R26.2]  Generalized weakness [R53.1]    History of Present Illness: Rachael [name] is a 81 y.o. female admitted on 02/04/2017 with SOB, lower back and BLE pain.     Patient Active Problem List   Diagnosis   . COPD exacerbation   . Acute respiratory failure with hypoxia   . Lactic acidosis   . Acute bronchitis due to Rhinovirus   . Sepsis, due to unspecified organism   . Hypoxia   . Hyponatremia   . Leukocytosis   . Anxiety   . GERD (gastroesophageal reflux disease)   .  HLD (hyperlipidemia)   . HTN (hypertension)   . Hypothyroid   . PMR (polymyalgia rheumatica)   . OSA (obstructive sleep apnea)   . Diabetes mellitus, type II   . Generalized weakness        Past Medical/Surgical History:  Past Medical History:   Diagnosis Date   . Anxiety    . Arthritis    . 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 Chronic cholecystitis with calculus  10/31/2015   . Chronic obstructive pulmonary disease    . Gastroesophageal reflux disease    . Hiatal hernia    . Hyperlipidemia    . Hypertension    . Hypothyroidism    . Incontinent of urine 2014    -WEARS DIAPERS-WILL BRING HER OWN   . Low back pain    . Lymphedema of right lower extremity    . PMR (polymyalgia rheumatica) 01/07/2017   . Polymyalgia rheumatica 09/2014   . Sleep apnea     USES CPAP NIGHTLY   . Type 2 diabetes mellitus, controlled    . Type II or unspecified type diabetes mellitus without mention of complication, not stated as uncontrolled    . Venous insufficiency of right leg 2016    also has lymphedema-KEEPS HER LEGS WRAPPED EXCEPT FOR WHEN SHE BATHES      Past Surgical History:   Procedure Laterality Date   . BACK SURGERY  2014   . CARPAL TUNNEL RELEASE Bilateral 1978   . HYSTERECTOMY  1985   . ROBOTIC, CHOLECYSTECTOMY N/A 10/31/2015    Procedure: ROBOT ASSISTED, LAPAROSCOPIC, CHOLECYSTECTOMY, UMBILICAL HERNIA REPAIR;  Surgeon: Alen Blew, MD;  Location: [facility];  Service: General;  Laterality: N/A;       Social History:  Prior Level of Function  Prior level of function: Ambulates with assistive device, Needs assistance with ADLs  Assistive Device: Other (Comment) (three wheeled walker)  Baseline Activity Level: Community ambulation  Driving: independent  Cooking: No  DME Currently at Home: Single point cane, St[employer] Glide  Home Living Arrangements  Living Arrangements: Children  Type of Home: House  Home Layout: Two level  Bathroom Shower/Tub: Cabin crew: Paediatric nurse, Grab bars in shower, Hand-held shower  Bathroom Accessibility: Accessible  DME Currently at Home: Single point cane, Stair Glide  Home Living - Notes / Comments: Daughter and SIL work in [place] [employer] daily; cannot assist. Pt had initiated getting private duty aide 4 hrs/day 2 days a week.        Subjective:    Patient is agreeable to participation in the therapy session. Nursing clears patient  for therapy.   Patient Goal  Patient Goal: To return home       Objective:   Observation of Patient/Vital Signs:  Patient is in bed with telemetry and SCD's in place.    Cognition/Neuro Status  Arousal/Alertness: Appropriate responses to stimuli  Attention Span: Appears intact  Orientation Level: Oriented to person;Oriented to time  Memory: Appears intact  Following Commands: Follows all commands and directions without difficulty  Safety Awareness: minimal verbal instruction  Insights: Fully aware of deficits  Behavior: calm;cooperative;attentive  Motor Planning: intact    Sensation: Light touch intact BLE    Gross ROM  Right Lower Extremity ROM: within functional limits  Left Lower Extremity ROM: within functional limits  Gross Strength  Right Lower Extremity Strength: 4-/5  Left Lower Extremity Strength: 4-/5  Functional Mobility  Supine to Sit: Supervision;HOB raised;to Right;Increased Time  Scooting to EOB: Supervision  Sit to Supine: Supervision  Sit to Stand: Moderate Assist. Manual facilitation required at mid trunk to promote trunk elevation/stability for upright standing posture.  Stand to Sit: Minimal Assist. Manual facilitation required at hips to maintain eccentric control before sitting.     Locomotion  Ambulation: Minimal Assist. Verbal instruction required to promote an upright posture and proper foot sequencing with walker held closer to trunk.  Pattern: decreased cadence;decreased step length;R foot decreased clearance;L foot decreased clearance  Distance Walked (ft) (Step 6,7): 60 Feet  PMP Activity: Step 7 - Walks out of Room     Balance  Sitting - Static: Good  Sitting - Dynamic: Good  Standing - Static: Good (w/ RW)  Standing - Dynamic: Fair (w/ RW)    Participation and Endurance  Participation Effort: good  Endurance: Tolerates 10 - 20 min exercise with multiple rests  [facility] Dyspnea Scale: 2+ Dyspnea   Pt c/o SOB with ambulation.    AM-PACT Inpatient Short Forms  Inpatient  AM-PACT Performed? (PT): Basic Mobility Inpatient Short Form  AM-PACT "6 Clicks" Basic Mobility Inpatient Short Form  Turning Over in Bed: A little  Sitting Down On/Standing From Armchair: A little  Lying on Back to Sitting on Side of Bed: A little  Assist Moving to/from Bed to Chair: A little  Assist to Walk in Hospital Room: A little  Assist to Climb 3-5 Steps with Railing: A lot  PT Basic Mobility Raw Score: 17  CMS 0-100% Score: 50.57%    Treatment Activities: Educated in and performed seated marches, ankle pumps and LAQs and instructed to perform to reduce the effects of immobility. Encouraged pt to take rest breaks and perform pursed lip breathing techniques when experiencing dyspnea with activity. Instructed pt to receive supervision with ambulation and bathroom management for safety. Encouraged to sit OOB as tolerated for pulmonary hygiene. Pt verbalized full understanding of education provided.    Educated the patient to role of physical therapy, plan of care, goals of therapy and safety with mobility and ADLs, energy conservation techniques, home safety.    Pt left sitting upright in bedside chair with call bell and items in reach. Chair alarm on. RN aware of session.      Jeylani Dublin SPT  Delfin Edis, [place], DPT  Pager #: [phone]

## 2017-02-05 NOTE — Progress Notes (Signed)
INTERNAL MEDICINE  PROGRESS NOTE     Patient: Rachael [name]  Date: 02/05/2017   LOS: 0 Days  Admission Date: 02/04/2017   MRN: [id number]  Attending: Carmelia Bake, MD       SUBJECTIVE     C.C MRI Brain negative for CVA. Continues to have lower back pain and spasms.     MEDICATIONS     Current Facility-Administered Medications   Medication Dose Route Frequency   . aspirin  81 mg Oral Daily   . atorvastatin  10 mg Oral QHS   . dilTIAZem  300 mg Oral Daily   . DULoxetine  60 mg Oral BID   . enoxaparin  40 mg Subcutaneous Daily   . fluticasone furoate-vilanterol  1 puff Inhalation QAM   . gabapentin  300 mg Oral BID   . guaiFENesin  600 mg Oral Daily   . hydrALAZINE  25 mg Oral BID   . losartan  100 mg Oral Daily    And   . hydroCHLOROthiazide  25 mg Oral Daily   . insulin glargine  14 Units Subcutaneous QAM   . ipratropium  0.5 mg Nebulization Q6H WA   . lactobacillus/streptococcus  1 capsule Oral Daily   . levothyroxine  50 mcg Oral Daily at 0600   . metFORMIN  500 mg Oral BID Meals   . metoprolol tartrate  25 mg Oral Q12H SCH   . pantoprazole  40 mg Oral QAM AC   . traZODone  50 mg Oral QHS         ROS   Back pain  Gait instability  weakness    PHYSICAL EXAM     Objective:  I/O last 3 completed shifts:  In: 1377 [P.O.:1042; I.V.:335]  Out: 850 [Urine:850]     Vitals:    02/05/17 1159 02/05/17 1345 02/05/17 1500 02/05/17 1756   BP: 117/70 117/73 114/66 102/64   Pulse: 90 91 91 98   Resp: 18 18 20 20    Temp: 97 F (36.1 C) 97.3 F (36.3 C) 97.8 F (36.6 C) 98.6 F (37 C)   TempSrc: Temporal Artery  Temporal Artery Oral   SpO2: 94% 97% 94% 90%   Weight:       Height:           Exam: Patient is awake, alert and oriented x 3, in no acute distress  HEENT: PERRLA, EOMI, Oral cavity well hydrated.  Neck: supple, without JVD  Chest: CTA bilaterally with diminished breath sounds. No crackles or wheezing.  CVS: S1, S2 normal, no rubs, no murmurs, no gallops.  Regular rate and rhythm.  Abdomen: soft and non tender  with good bowel sounds.  Musculoskeletal: No pitting edema, no cyanosis/clubbing  NEURO:  No Focal neurological deficits      LABS       Recent Labs  Lab 02/05/17  0543 02/04/17  0910   WBC 5.51 6.16   RBC 3.94* 4.65   Hgb 11.2* 13.1   Hematocrit 34.0* 40.9   MCV 86.3 88.0   Platelets 275 304       Recent Labs  Lab 02/04/17  1634 02/04/17  0910   Sodium 136 134*   Potassium 3.3* 3.8   Chloride 95* 94*   CO2 31* 26   BUN 10.2 12.0   Creatinine 0.8 1.0   Glucose 212* 190*   Calcium 9.6 10.3*           Invalid input(s): OSMOL    Recent  Labs  Lab 02/04/17  1634 02/04/17  0910   ALT 30 33   AST (SGOT) 22 33   Bilirubin, Total 0.4 0.6   Albumin 3.2* 3.4*   Alkaline Phosphatase 62 70       Recent Labs  Lab 02/04/17  0910   Troponin I 0.02         (!) 196  Invalid input(s): BLOODCULTURE      RADIOLOGY     Radiological Procedure reviewed.  MRI Brain WO Contrast   Final Result      1. No acute or recent infarct is detected.   2. Mild  subcortical, deep, and periventricular leukomalacia is seen in   both cerebral hemispheres. These findings are most suggestive of mild   chronic small vessel ischemic change.      Theodoro Doing, MD    02/05/2017 7:33 AM      CT Head without Contrast   Final Result      1. No acute intracranial abnormality is detected. The radiographic   findings discussed above appear chronic.      Otho Ket, MD    02/04/2017 11:15 AM      CT Lumbar Spine WO Contrast   Final Result      1. No acute process is detected.      Otho Ket, MD    02/04/2017 9:44 AM      XR Chest  AP Portable   Final Result    No evidence of acute cardiopulmonary disease.      Darra Lis, MD    02/04/2017 9:20 AM      Echocardiogram 2D complete    (Results Pending)         Patient Active Problem List    Diagnosis Date Noted   . Generalized weakness 02/04/2017   . Hypoxia 01/07/2017   . Hyponatremia 01/07/2017   . Leukocytosis 01/07/2017   . Anxiety 01/07/2017   . GERD (gastroesophageal reflux disease) 01/07/2017    . HLD (hyperlipidemia) 01/07/2017   . HTN (hypertension) 01/07/2017   . Hypothyroid 01/07/2017   . PMR (polymyalgia rheumatica) 01/07/2017   . OSA (obstructive sleep apnea) 01/07/2017   . Diabetes mellitus, type II 01/07/2017   . Sepsis, due to unspecified organism    . Acute bronchitis due to Rhinovirus 03/29/2016   . COPD exacerbation 03/25/2016   . Acute respiratory failure with hypoxia 03/25/2016   . Lactic acidosis 03/25/2016       Assessment and Plan:    Generalized weakness with difficulty ambulation. CT Head is negative. MRI Brain also negative for CVA. Stroke protocol discontinued. Continue aspirin 81 mg daily.    Back pain and spasms. PRN Norco. Heating pads. CT L spine is negative. PT recommends SNF   Recent admission with COPD exacerbation improved.  Continue Duonebs scheduled. Resume oxygen during  the night time. Resume Breo   OSA. CPAP at HS ordered   Hypertension. Stable. Resume home meds   Type 2 DM, Uncontrolledon Lantus Insulin and SSI. Increase Lantus to 15units daily. Add pre-meal Humalog TID AC   PMR on Cymbalta 60 mg BID   Nocturnal Hypoxia on nighttime Oxygen.    Hyperlipidemia        Estimate length of stay 2-3 days   Code status   Medical necessity for stay  Carmelia Bake, MD  02/05/2017 7:32 PM  Time spent for evaluation and management and coordination of care

## 2017-02-06 ENCOUNTER — Inpatient Hospital Stay: Payer: No Typology Code available for payment source

## 2017-02-06 LAB — CBC AND DIFFERENTIAL
Absolute NRBC: 0 10*3/uL
Basophils Absolute Automated: 0.04 10*3/uL (ref 0.00–0.20)
Basophils Automated: 0.5 %
Eosinophils Absolute Automated: 0.07 10*3/uL (ref 0.00–0.70)
Eosinophils Automated: 0.9 %
Hematocrit: 35 % — ABNORMAL LOW (ref 37.0–47.0)
Hgb: 11.7 g/dL — ABNORMAL LOW (ref 12.0–16.0)
Immature Granulocytes Absolute: 0.09 10*3/uL — ABNORMAL HIGH
Immature Granulocytes: 1.2 %
Lymphocytes Absolute Automated: 0.89 10*3/uL (ref 0.50–4.40)
Lymphocytes Automated: 11.9 %
MCH: 28.4 pg (ref 28.0–32.0)
MCHC: 33.4 g/dL (ref 32.0–36.0)
MCV: 85 fL (ref 80.0–100.0)
MPV: 8.9 fL — ABNORMAL LOW (ref 9.4–12.3)
Monocytes Absolute Automated: 0.55 10*3/uL (ref 0.00–1.20)
Monocytes: 7.3 %
Neutrophils Absolute: 5.87 10*3/uL (ref 1.80–8.10)
Neutrophils: 78.2 %
Nucleated RBC: 0 /100 WBC (ref 0.0–1.0)
Platelets: 313 10*3/uL (ref 140–400)
RBC: 4.12 10*6/uL — ABNORMAL LOW (ref 4.20–5.40)
RDW: 16 % — ABNORMAL HIGH (ref 12–15)
WBC: 7.51 10*3/uL (ref 3.50–10.80)

## 2017-02-06 LAB — COMPREHENSIVE METABOLIC PANEL
ALT: 39 U/L (ref 0–55)
AST (SGOT): 30 U/L (ref 5–34)
Albumin/Globulin Ratio: 1.2 (ref 0.9–2.2)
Albumin: 3.1 g/dL — ABNORMAL LOW (ref 3.5–5.0)
Alkaline Phosphatase: 85 U/L (ref 37–106)
Anion Gap: 12 (ref 5.0–15.0)
BUN: 13.6 mg/dL (ref 7.0–19.0)
Bilirubin, Total: 0.4 mg/dL (ref 0.2–1.2)
CO2: 28 mEq/L (ref 22–29)
Calcium: 9.4 mg/dL (ref 7.9–10.2)
Chloride: 92 mEq/L — ABNORMAL LOW (ref 100–111)
Creatinine: 0.8 mg/dL (ref 0.6–1.0)
Globulin: 2.6 g/dL (ref 2.0–3.6)
Glucose: 193 mg/dL — ABNORMAL HIGH (ref 70–100)
Potassium: 3 mEq/L — ABNORMAL LOW (ref 3.5–5.1)
Protein, Total: 5.7 g/dL — ABNORMAL LOW (ref 6.0–8.3)
Sodium: 132 mEq/L — ABNORMAL LOW (ref 136–145)

## 2017-02-06 LAB — GLUCOSE WHOLE BLOOD - POCT
Whole Blood Glucose POCT: 143 mg/dL — ABNORMAL HIGH (ref 70–100)
Whole Blood Glucose POCT: 168 mg/dL — ABNORMAL HIGH (ref 70–100)
Whole Blood Glucose POCT: 164 mg/dL — ABNORMAL HIGH (ref 70–100)
Whole Blood Glucose POCT: 180 mg/dL — ABNORMAL HIGH (ref 70–100)

## 2017-02-06 LAB — GFR: EGFR: >60

## 2017-02-06 MED ORDER — ACETAMINOPHEN 325 MG PO TABS
650.0000 mg | ORAL_TABLET | ORAL | Status: DC | PRN
Start: 2017-02-06 — End: 2017-02-08
  Administered 2017-02-06: 650 mg via ORAL
  Filled 2017-02-06: qty 2

## 2017-02-06 NOTE — Plan of Care (Signed)
Problem: Safety  Goal: Patient will be free from injury during hospitalization  Outcome: Progressing   02/06/17 0213   Goal/Interventions addressed this shift   Patient will be free from injury during hospitalization  Assess patient's risk for falls and implement fall prevention plan of care per policy;Provide and maintain safe environment;Use appropriate transfer methods;Include patient/ family/ care giver in decisions related to safety;Ensure appropriate safety devices are available at the bedside;Hourly rounding;Assess for patients risk for elopement and implement Elopement Risk Plan per policy   Call bell at bedside. Belongings are within reach. Calls are answer promptly. Hourly rounds done. Will continue to monitor.       Problem: Pain  Goal: Pain at adequate level as identified by patient  Outcome: Progressing   02/06/17 1107   Goal/Interventions addressed this shift   Pain at adequate level as identified by patient Identify patient comfort function goal;Assess for risk of opioid induced respiratory depression, including snoring/sleep apnea. Alert healthcare team of risk factors identified.;Reassess pain within 30-60 minutes of any procedure/intervention, per Pain Assessment, Intervention, Reassessment (AIR) Cycle   Patient c/o back pain. Pain meds are given PRN.  Include patient and family in pain management plan. Will continue to assess patient's pain during hourly rounding.

## 2017-02-06 NOTE — Plan of Care (Signed)
Problem: Safety  Goal: Patient will be free from injury during hospitalization  Outcome: Progressing   02/06/17 0213   Goal/Interventions addressed this shift   Patient will be free from injury during hospitalization  Assess patient's risk for falls and implement fall prevention plan of care per policy;Provide and maintain safe environment;Use appropriate transfer methods;Include patient/ family/ care giver in decisions related to safety;Ensure appropriate safety devices are available at the bedside;Hourly rounding;Assess for patients risk for elopement and implement Elopement Risk Plan per policy   Bed in low locked position, alarm is on. Call light within reach, instructed to call for assistance. Nonslip socks when OOB. Hourly rounding.   Goal: Patient will be free from infection during hospitalization  Outcome: Progressing   02/06/17 0213   Goal/Interventions addressed this shift   Free from Infection during hospitalization Assess and monitor for signs and symptoms of infection;Monitor lab/diagnostic results;Monitor all insertion sites (i.e. indwelling lines, tubes, urinary catheters, and drains)       Problem: Moderate/High Fall Risk Score >5  Goal: Patient will remain free of falls  Outcome: Progressing   02/05/17 1930   OTHER   High (Greater than 13) MOD-Initiate Yellow "Fall Risk" magnet communication tool;MOD-Use of chair-pad alarm when appropriate;MOD-Remain with patient during toileting;MOD-Use of assistive devices -Bedside Commode if appropriate;MOD-Re-orient confused patients;MOD-Include family in multidisciplinary POC discussions;MOD-Place Fall Risk level on whiteboard in room;HIGH-Apply yellow "Fall Risk" arm band;HIGH-Activate bed/chair exit alarm where available   Bed in low locked position, alarm is on. Call light within reach, instructed to call for assistance. Nonslip socks when OOB. Hourly rounding    Problem: Every Day - Stroke  Goal: Core/Quality measure requirements - Daily  Outcome:  Progressing    Goal: Neurological status is stable or improving  Outcome: Progressing   02/06/17 0213   Goal/Interventions addressed this shift   Neurological status is stable or improving Monitor/assess/document neurological assessment (Stroke: every 4 hours);Perform CAM Assessment   Stroke protocol D/C'd at 1734 on March/23.    Comments: PM assessments completed.

## 2017-02-06 NOTE — Plan of Care (Signed)
Problem: Safety  Goal: Patient will be free from injury during hospitalization  Outcome: Progressing   02/06/17 2325   Goal/Interventions addressed this shift   Patient will be free from injury during hospitalization  Assess patient's risk for falls and implement fall prevention plan of care per policy;Provide and maintain safe environment;Use appropriate transfer methods;Ensure appropriate safety devices are available at the bedside;Include patient/ family/ care giver in decisions related to safety;Hourly rounding;Assess for patients risk for elopement and implement Elopement Risk Plan per policy   Bed in low locked position, alarm is on. Call light within reach, instructed to call for assistance. Nonslip socks when OOB. Hourly rounding.  Goal: Patient will be free from infection during hospitalization  Outcome: Progressing   02/06/17 2325   Goal/Interventions addressed this shift   Free from Infection during hospitalization Assess and monitor for signs and symptoms of infection;Monitor lab/diagnostic results;Monitor all insertion sites (i.e. indwelling lines, tubes, urinary catheters, and drains)       Problem: Moderate/High Fall Risk Score >5  Goal: Patient will remain free of falls  Outcome: Progressing   02/06/17 2000   OTHER   High (Greater than 13) MOD-Initiate Yellow "Fall Risk" magnet communication tool;MOD-Use of chair-pad alarm when appropriate;MOD-Remain with patient during toileting;MOD-Use of assistive devices -Bedside Commode if appropriate;MOD-Re-orient confused patients;MOD-Include family in multidisciplinary POC discussions;MOD-Place Fall Risk level on whiteboard in room;HIGH-Apply yellow "Fall Risk" arm band;HIGH-Activate bed/chair exit alarm where available   Bed in low locked position, alarm is on. Call light within reach, instructed to call for assistance. Nonslip socks when OOB. Hourly rounding.    Comments: PM assessments completed.

## 2017-02-06 NOTE — Progress Notes (Addendum)
INTERNAL MEDICINE  PROGRESS NOTE     Patient: Rachael [name]  Date: 02/06/2017   LOS: 0 Days  Admission Date: 02/04/2017   MRN: [id number]  Attending: Carmelia Bake, MD       SUBJECTIVE     C.C MRI Brain negative for CVA.  Reports improvement in lower back pain and spasms.   DEVELOPED FEVER 101. Confused per daughter. Physically and mentally declined  MEDICATIONS     Current Facility-Administered Medications   Medication Dose Route Frequency   . aspirin  81 mg Oral Daily   . atorvastatin  10 mg Oral QHS   . dilTIAZem  300 mg Oral Daily   . DULoxetine  60 mg Oral BID   . enoxaparin  40 mg Subcutaneous Daily   . fluticasone furoate-vilanterol  1 puff Inhalation QAM   . gabapentin  300 mg Oral BID   . guaiFENesin  600 mg Oral Daily   . hydrALAZINE  25 mg Oral BID   . losartan  100 mg Oral Daily    And   . hydroCHLOROthiazide  25 mg Oral Daily   . insulin glargine  14 Units Subcutaneous QAM   . ipratropium  0.5 mg Nebulization Q6H WA   . lactobacillus/streptococcus  1 capsule Oral Daily   . levothyroxine  50 mcg Oral Daily at 0600   . metFORMIN  500 mg Oral BID Meals   . metoprolol tartrate  25 mg Oral Q12H SCH   . pantoprazole  40 mg Oral QAM AC   . traZODone  50 mg Oral QHS         ROS   Back pain  Gait instability  weakness    PHYSICAL EXAM     Objective:  I/O last 3 completed shifts:  In: 1377 [P.O.:1042; I.V.:335]  Out: 850 [Urine:850]     Vitals:    02/06/17 0507 02/06/17 0758 02/06/17 0942 02/06/17 1328   BP: (!) 128/93  111/71 95/61   Pulse: 90 100 87 77   Resp: 17 22 18 20    Temp: 98.2 F (36.8 C)  98 F (36.7 C) 98.8 F (37.1 C)   TempSrc: Temporal Artery  Oral    SpO2: 91% 92% 93% 92%   Weight: 75.9 kg (167 lb 4.8 oz)      Height:           Exam: Patient is awake, alert and oriented x 3, in no acute distress  HEENT: PERRLA, EOMI, Oral cavity well hydrated.  Neck: supple, without JVD  Chest: CTA bilaterally with diminished breath sounds. No crackles or wheezing.  CVS: S1, S2 normal, no rubs, no  murmurs, no gallops.  Regular rate and rhythm.  Abdomen: soft and non tender with good bowel sounds.  Musculoskeletal: No pitting edema, no cyanosis/clubbing  NEURO:  No Focal neurological deficits      LABS       Recent Labs  Lab 02/05/17  0543 02/04/17  0910   WBC 5.51 6.16   RBC 3.94* 4.65   Hgb 11.2* 13.1   Hematocrit 34.0* 40.9   MCV 86.3 88.0   Platelets 275 304       Recent Labs  Lab 02/04/17  1634 02/04/17  0910   Sodium 136 134*   Potassium 3.3* 3.8   Chloride 95* 94*   CO2 31* 26   BUN 10.2 12.0   Creatinine 0.8 1.0   Glucose 212* 190*   Calcium 9.6 10.3*  Invalid input(s): OSMOL    Recent Labs  Lab 02/04/17  1634 02/04/17  0910   ALT 30 33   AST (SGOT) 22 33   Bilirubin, Total 0.4 0.6   Albumin 3.2* 3.4*   Alkaline Phosphatase 62 70       Recent Labs  Lab 02/04/17  0910   Troponin I 0.02         (!) 168  Invalid input(s): BLOODCULTURE      RADIOLOGY     Radiological Procedure reviewed.  Echocardiogram 2D complete   Final Result         1.  The quality of the study is technically difficult but adequate for   interpretation.   2.  The left ventricle is normal in size. Left ventricular systolic   function is normal. There are no regional wall motion abnormalities.   Estimated EF is 65%. There is no left ventricular hypertrophy. Grade 1   diastolic dysfunction consistent with impaired relaxation.   3.  The left atrium is normal in size.   4.  The aortic valve is not felt visualized but probably trileaflet and   sclerotic. There is no significant aortic insufficiency. Borderline to   mild aortic stenosis is present. Peak gradient of 31 with a mean   gradient of 15 mmHg across aortic valve. The aortic root is normal in   size.   5.  The mitral valve is structurally normal but sclerotic. Trace to mild   mitral insufficiency is present. Mitral annular calcification noted.   6.  The right ventricle is normal in size and function.   7.  The right atrium is normal in size.   8. The tricuspid valve is  structurally normal. There is trace tricuspid   insufficiency.   9. The pulmonic valve is not well-visualized. There is no significant   pulmonic insufficiency.   10. There is mild pulmonary hypertension.   11. No pericardial effusion, intracardiac thrombi, or masses are seen.   Prominent fat pad noted.   12. The atrial septum is structurally normal. No shunt by color flow   doppler.      CONCLUSION:      Technically difficult study.   Normal wall motion estimated LV ejection fraction of 65%.   Grade 1 diastolic dysfunction consistent with impaired relaxation.   Aortic valve sclerosis with borderline to mild aortic stenosis.   Mitral annular calcification with trace to mild mitral regurgitation.   Mild pulmonary hypertension.         Elbert Ewings, MD    02/06/2017 9:34 AM      MRI Brain WO Contrast   Final Result      1. No acute or recent infarct is detected.   2. Mild  subcortical, deep, and periventricular leukomalacia is seen in   both cerebral hemispheres. These findings are most suggestive of mild   chronic small vessel ischemic change.      Theodoro Doing, MD    02/05/2017 7:33 AM      CT Head without Contrast   Final Result      1. No acute intracranial abnormality is detected. The radiographic   findings discussed above appear chronic.      Otho Ket, MD    02/04/2017 11:15 AM      CT Lumbar Spine WO Contrast   Final Result      1. No acute process is detected.      Otho Ket, MD  02/04/2017 9:44 AM      XR Chest  AP Portable   Final Result    No evidence of acute cardiopulmonary disease.      Darra Lis, MD    02/04/2017 9:20 AM            Patient Active Problem List    Diagnosis Date Noted   . Generalized weakness 02/04/2017   . Hypoxia 01/07/2017   . Hyponatremia 01/07/2017   . Leukocytosis 01/07/2017   . Anxiety 01/07/2017   . GERD (gastroesophageal reflux disease) 01/07/2017   . HLD (hyperlipidemia) 01/07/2017   . HTN (hypertension) 01/07/2017   . Hypothyroid 01/07/2017   . PMR  (polymyalgia rheumatica) 01/07/2017   . OSA (obstructive sleep apnea) 01/07/2017   . Diabetes mellitus, type II 01/07/2017   . Sepsis, due to unspecified organism    . Acute bronchitis due to Rhinovirus 03/29/2016   . COPD exacerbation 03/25/2016   . Acute respiratory failure with hypoxia 03/25/2016   . Lactic acidosis 03/25/2016       Assessment and Plan:    Fever/ SIRS. Check blood cultures X 2. CBC, CMP STAT. Check CXR/UA. PRN Tylenol   Generalized weakness with difficulty ambulation. CT Head is negative. MRI Brain also negative for CVA. Stroke protocol discontinued. Continue aspirin 81 mg daily.    Back pain and spasms. PRN Norco. Heating pads. CT L spine is negative. PT recommends SNF   Recent admission with COPD exacerbation improved.  Continue Duonebs scheduled. Resume oxygen during  the night time. Resume Breo   OSA. CPAP at HS ordered   Hypertension. Stable. Resume home meds   Type 2 DM, Uncontrolledon Lantus Insulin and SSI. Increase Lantus to 15units daily. Add pre-meal Humalog TID AC   PMR on Cymbalta 60 mg BID   Nocturnal Hypoxia on nighttime Oxygen.    Hyperlipidemia  Awaiting auth from Insurance to go to SNF      Estimate length of stay 2 days   Code status: fULL   Medical necessity for stay  Carmelia Bake, MD  02/06/2017 2:06 PM  Time spent for evaluation and management and coordination of care

## 2017-02-07 ENCOUNTER — Inpatient Hospital Stay: Payer: No Typology Code available for payment source

## 2017-02-07 LAB — CBC AND DIFFERENTIAL
Absolute NRBC: 0 10*3/uL
Basophils Absolute Automated: 0.04 10*3/uL (ref 0.00–0.20)
Basophils Automated: 0.7 %
Eosinophils Absolute Automated: 0.1 10*3/uL (ref 0.00–0.70)
Eosinophils Automated: 1.8 %
Hematocrit: 32.9 % — ABNORMAL LOW (ref 37.0–47.0)
Hgb: 10.9 g/dL — ABNORMAL LOW (ref 12.0–16.0)
Immature Granulocytes Absolute: 0.06 10*3/uL — ABNORMAL HIGH
Immature Granulocytes: 1.1 %
Lymphocytes Absolute Automated: 0.77 10*3/uL (ref 0.50–4.40)
Lymphocytes Automated: 14.2 %
MCH: 28.3 pg (ref 28.0–32.0)
MCHC: 33.1 g/dL (ref 32.0–36.0)
MCV: 85.5 fL (ref 80.0–100.0)
MPV: 9 fL — ABNORMAL LOW (ref 9.4–12.3)
Monocytes Absolute Automated: 0.3 10*3/uL (ref 0.00–1.20)
Monocytes: 5.5 %
Neutrophils Absolute: 4.15 10*3/uL (ref 1.80–8.10)
Neutrophils: 76.7 %
Nucleated RBC: 0 /100 WBC (ref 0.0–1.0)
Platelets: 312 10*3/uL (ref 140–400)
RBC: 3.85 10*6/uL — ABNORMAL LOW (ref 4.20–5.40)
RDW: 16 % — ABNORMAL HIGH (ref 12–15)
WBC: 5.42 10*3/uL (ref 3.50–10.80)

## 2017-02-07 LAB — URINALYSIS REFLEX TO MICROSCOPIC EXAM - REFLEX TO CULTURE
Bilirubin, UA: NEGATIVE
Blood, UA: NEGATIVE
Glucose, UA: NEGATIVE
Ketones UA: NEGATIVE
Leukocyte Esterase, UA: NEGATIVE
Nitrite, UA: NEGATIVE
Protein, UR: NEGATIVE
Specific Gravity UA: 1.017 (ref 1.001–1.035)
Urine pH: 5 (ref 5.0–8.0)
Urobilinogen, UA: NORMAL mg/dL

## 2017-02-07 LAB — GLUCOSE WHOLE BLOOD - POCT
Whole Blood Glucose POCT: 189 mg/dL — ABNORMAL HIGH (ref 70–100)
Whole Blood Glucose POCT: 291 mg/dL — ABNORMAL HIGH (ref 70–100)
Whole Blood Glucose POCT: 93 mg/dL (ref 70–100)
Whole Blood Glucose POCT: 171 mg/dL — ABNORMAL HIGH (ref 70–100)

## 2017-02-07 MED ORDER — POTASSIUM CHLORIDE CRYS ER 20 MEQ PO TBCR
40.0000 meq | EXTENDED_RELEASE_TABLET | Freq: Once | ORAL | Status: AC
Start: 2017-02-07 — End: 2017-02-07
  Administered 2017-02-07: 02:00:00 40 meq via ORAL
  Filled 2017-02-07: qty 2

## 2017-02-07 MED ORDER — POTASSIUM CHLORIDE CRYS ER 20 MEQ PO TBCR
40.0000 meq | EXTENDED_RELEASE_TABLET | Freq: Once | ORAL | Status: AC
Start: 2017-02-07 — End: 2017-02-07
  Administered 2017-02-07: 22:00:00 40 meq via ORAL
  Filled 2017-02-07: qty 2

## 2017-02-07 NOTE — UM Notes (Signed)
80y/o female came into ER on 3/22 and admitted OBS to medical on same day. Changed to inpatient 02/06/17  Pnt w lower back pain with bilateral leg pain occurring in spasms intermittently over the last 3-4 days. States pain is typically worse at night with associated nausea. Has a history of chronic obstructive pulmonary disease and was post reduction last episode did not take it last night and was found to be having low oxygen levels at 84 percent when EMS arrived.     Past Medical History        Past Medical History:   Diagnosis Date   . Anxiety    . Arthritis    . 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 Chronic cholecystitis with calculus 10/31/2015   . Chronic obstructive pulmonary disease    . Gastroesophageal reflux disease    . Hiatal hernia    . Hyperlipidemia    . Hypertension    . Hypothyroidism    . Incontinent of urine 2014    -WEARS DIAPERS-WILL BRING HER OWN   . Low back pain    . Lymphedema of right lower extremity    . PMR (polymyalgia rheumatica) 01/07/2017   . Polymyalgia rheumatica 09/2014   . Sleep apnea     USES CPAP NIGHTLY   . Type 2 diabetes mellitus, controlled    . Type II or unspecified type diabetes mellitus without mention of complication, not stated as uncontrolled    . Venous insufficiency of right leg 2016    also has lymphedema-KEEPS HER LEGS WRAPPED EXCEPT FOR WHEN SHE BATHES        VS: 98.6; p105; 93%2L; r20; 140/64; 9/10 pain     ER meds  IV zofran   IV morphine   PO aspirin     Abn Labs  u-cloudy   Glu 190  NA 134   Cl 94   CA 10.3   Alb 3.4  CT head: 1. No acute intracranial abnormality is detected. The radiographic  findings discussed above appear chronic.  CT Lspine: no acute abn   CXR: no acute abn   SINUS TACHYCARDIA  ST & T WAVE ABNORMALITY, CONSIDER ANTEROLATERAL ISCHEMIA  ABNORMAL ECG  WHEN COMPARED WITH ECG OF 25-Mar-2016 15:10,  SINUS RHYTHM HAS REPLACED JUNCTIONAL RHYTHM  INVERTED T WAVES HAVE REPLACED NONSPECIFIC T WAVE  ABNORMALITY IN Anterolateral leads    Pnt being admitted OBS to medical for [facility]  Assessment and Plan per medical 02/04/17:  IMPRESSION AND PLAN:    Generalized weakness with difficulty ambulation. CT Head is negative. MRI Brain ordered. Stroke protocol initiated. Continue aspirin 81 mg daily. Neuro checks per protocol   Back pain and spasms. PRN Norco. Heating pads. CT L spine is negative   Recent admission with COPD exacerbation improved.  Continue Duonebs scheduled. Resume oxygen during  the night time. Resume Breo   OSA. CPAP at HS ordered   Hypertension. Stable. Resume home meds   Type 2 DM, Uncontrolledon Lantus Insulin and SSI. Increase Lantus to 15units daily. Add pre-meal Humalog TID AC   PMR on Cymbalta 60 mg BID   Nocturnal Hypoxia on nighttime Oxygen.    Hyperlipidemia    Orders:  Sq lovenox  ECHO  MRI angio head/neck  MRI brain   NPO   Telemetry monitoring   Vs  w neuro q4   PT/OT     Changed to inpatient 02/06/17 for generalized weakness.  VS: T 98.8,  p 110, SATS 90% RA, resp 20, bp 103/66, pain 8/10  Labs: hgb 10.9, hct 32.9, rbc 3.85,   Assessment and Plan per medial 02/07/17:   Assessment and Plan:    Fever/ SIRS. Check blood cultures X 2 pending. No further fevers. No Leukocytosis.  Negative CXR/UA. PRN Tylenol   Hypokalemia. Replace with 40 Meq of K   Generalized weakness with difficulty ambulation. CT Head is negative. MRI Brain also negative for CVA. Stroke protocol discontinued. Continue aspirin 81 mg daily.    Back pain and spasms with shaking and difficulty ambulation. Will order MRI L spine. PRN Norco. Heating pads. CT L spine is negative. PT recommends SNF. I have ordered MRI L spine   Recent admission with COPD exacerbation improved. ContinueDuonebs scheduled. Resume oxygen duringthe nighttime. Resume Breo   OSA. CPAP at HS ordered   Hypertension. Stable. Resume home meds   Type 2 DM, Uncontrolledon Lantus Insulin and SSI. Increase Lantus to 15units daily. Add  pre-meal Humalog TID AC   PMR on Cymbalta 60 mg BID   Nocturnal Hypoxia on nighttime Oxygen.    Hyperlipidemia    Orders:   Aspirin chewable  cardizem po  lovenox sc  breo ellipta inhale  Apresoline bid  Insulin sc  atrovent nebs q 6 hrs  Cozaar and hydrodiuril po  Lopressor po  MRI lumbar spine  cardicac diet  CPAP q hs  O2 5 lpm or less  Nebs q 6 hrs  OT/PT  resp re assess bid  Straight cath  Tele  VS and Pulse Ox routine

## 2017-02-07 NOTE — Plan of Care (Signed)
Problem: Safety  Goal: Patient will be free from injury during hospitalization  Outcome: Progressing   02/07/17 2358   Goal/Interventions addressed this shift   Patient will be free from injury during hospitalization  Assess patient's risk for falls and implement fall prevention plan of care per policy;Provide and maintain safe environment;Use appropriate transfer methods;Ensure appropriate safety devices are available at the bedside;Include patient/ family/ care giver in decisions related to safety;Hourly rounding;Assess for patients risk for elopement and implement Elopement Risk Plan per policy   Bed alarm on. Call bell and bedside table within reach. Bed at lowest position. Pt ambulates with walker. Hourly rounding ongoing.

## 2017-02-07 NOTE — Plan of Care (Signed)
Problem: Safety  Goal: Patient will be free from injury during hospitalization  Outcome: Progressing   02/07/17 1720   Goal/Interventions addressed this shift   Patient will be free from injury during hospitalization  Assess patient's risk for falls and implement fall prevention plan of care per policy;Provide and maintain safe environment;Use appropriate transfer methods;Ensure appropriate safety devices are available at the bedside;Include patient/ family/ care giver in decisions related to safety;Hourly rounding;Assess for patients risk for elopement and implement Elopement Risk Plan per policy     1 person assist with walker.     Problem: Pain  Goal: Pain at adequate level as identified by patient  Outcome: Progressing   02/07/17 1720   Goal/Interventions addressed this shift   Pain at adequate level as identified by patient Assess for risk of opioid induced respiratory depression, including snoring/sleep apnea. Alert healthcare team of risk factors identified.;Reassess pain within 30-60 minutes of any procedure/intervention, per Pain Assessment, Intervention, Reassessment (AIR) Cycle;Evaluate if patient comfort function goal is met;Evaluate patient's satisfaction with pain management progress;Offer non-pharmacological pain management interventions

## 2017-02-07 NOTE — Progress Notes (Signed)
02/07/17 1815   Provider Notification   Reason for Communication Change in status  (temp = 100.9)   Provider Name Dr. Tressie Ellis   Provider Role Attending physician   Method of Communication Page   Response Waiting for response     Temperature 100.4 and pt reports chills. Temperature retaken 45 minutes later, now 100.9 - Dr. Tressie Ellis paged. Pt remains A&Ox4, denies SOB.

## 2017-02-07 NOTE — Progress Notes (Signed)
CM met with family at request to review SNF options. Family lists first choice as [facility]. They are aware that placement will be based on bed availability and which facility is on network with insurance. They are not interested in [facility]. Covering CM to follow up with family Monday for final choice and to start insurance auth.       Cyndie Chime  Discharge Planner

## 2017-02-07 NOTE — Progress Notes (Addendum)
INTERNAL MEDICINE  PROGRESS NOTE     Patient: Rachael [name]  Date: 02/07/2017   LOS: 1 Days  Admission Date: 02/04/2017   MRN: [id number]  Attending: Carmelia Bake, MD       SUBJECTIVE     C.C K 3.0 ( replaced). No further fevers since last night.   DEVELOPED FEVER 101.3 last night. Confused per daughter. Physically and mentally declined per daughter in last 2 days. Reports increased LE weakness  MEDICATIONS     Current Facility-Administered Medications   Medication Dose Route Frequency   . aspirin  81 mg Oral Daily   . atorvastatin  10 mg Oral QHS   . dilTIAZem  300 mg Oral Daily   . DULoxetine  60 mg Oral BID   . enoxaparin  40 mg Subcutaneous Daily   . fluticasone furoate-vilanterol  1 puff Inhalation QAM   . gabapentin  300 mg Oral BID   . guaiFENesin  600 mg Oral Daily   . hydrALAZINE  25 mg Oral BID   . losartan  100 mg Oral Daily    And   . hydroCHLOROthiazide  25 mg Oral Daily   . insulin glargine  14 Units Subcutaneous QAM   . ipratropium  0.5 mg Nebulization Q6H WA   . lactobacillus/streptococcus  1 capsule Oral Daily   . levothyroxine  50 mcg Oral Daily at 0600   . metFORMIN  500 mg Oral BID Meals   . metoprolol tartrate  25 mg Oral Q12H SCH   . pantoprazole  40 mg Oral QAM AC   . traZODone  50 mg Oral QHS         ROS   Back pain  Gait instability  weakness    PHYSICAL EXAM     Objective:  I/O last 3 completed shifts:  In: 900 [P.O.:900]  Out: 370 [Urine:370]     Vitals:    02/07/17 0113 02/07/17 0512 02/07/17 0800 02/07/17 0946   BP: 99/64 122/76  106/68   Pulse: 78 91 (!) 103 (!) 110   Resp: 20 18 22  (!) 24   Temp: 97.7 F (36.5 C) 99.9 F (37.7 C)  98.8 F (37.1 C)   TempSrc:  Oral  Oral   SpO2: 94% 95% 92% 95%   Weight:  77.2 kg (170 lb 4.8 oz)     Height:           Exam: Patient is awake, alert and oriented x 3, in no acute distress  HEENT: PERRLA, EOMI, Oral cavity well hydrated.  Neck: supple, without JVD  Chest: CTA bilaterally with diminished breath sounds. No crackles or  wheezing.  CVS: S1, S2 normal, no rubs, no murmurs, no gallops.  Regular rate and rhythm.  Abdomen: soft and non tender with good bowel sounds.  Musculoskeletal: No pitting edema, no cyanosis/clubbing  NEURO:  No Focal neurological deficits      LABS       Recent Labs  Lab 02/06/17  1941 02/05/17  0543 02/04/17  0910   WBC 7.51 5.51 6.16   RBC 4.12* 3.94* 4.65   Hgb 11.7* 11.2* 13.1   Hematocrit 35.0* 34.0* 40.9   MCV 85.0 86.3 88.0   Platelets 313 275 304       Recent Labs  Lab 02/06/17  1941 02/04/17  1634 02/04/17  0910   Sodium 132* 136 134*   Potassium 3.0* 3.3* 3.8   Chloride 92* 95* 94*   CO2 28 31* 26  BUN 13.6 10.2 12.0   Creatinine 0.8 0.8 1.0   Glucose 193* 212* 190*   Calcium 9.4 9.6 10.3*           Invalid input(s): OSMOL    Recent Labs  Lab 02/06/17  1941 02/04/17  1634 02/04/17  0910   ALT 39 30 33   AST (SGOT) 30 22 33   Bilirubin, Total 0.4 0.4 0.6   Albumin 3.1* 3.2* 3.4*   Alkaline Phosphatase 85 62 70       Recent Labs  Lab 02/04/17  0910   Troponin I 0.02         (!) 291  Invalid input(s): BLOODCULTURE      RADIOLOGY     Radiological Procedure reviewed.  XR Chest 2 Views   Final Result         1.  Mild left lung base atelectasis, unchanged. No focal infiltrate or   consolidation.      Candi Leash, MD    02/06/2017 7:32 PM      Echocardiogram 2D complete   Final Result         1.  The quality of the study is technically difficult but adequate for   interpretation.   2.  The left ventricle is normal in size. Left ventricular systolic   function is normal. There are no regional wall motion abnormalities.   Estimated EF is 65%. There is no left ventricular hypertrophy. Grade 1   diastolic dysfunction consistent with impaired relaxation.   3.  The left atrium is normal in size.   4.  The aortic valve is not felt visualized but probably trileaflet and   sclerotic. There is no significant aortic insufficiency. Borderline to   mild aortic stenosis is present. Peak gradient of 31 with a mean   gradient of  15 mmHg across aortic valve. The aortic root is normal in   size.   5.  The mitral valve is structurally normal but sclerotic. Trace to mild   mitral insufficiency is present. Mitral annular calcification noted.   6.  The right ventricle is normal in size and function.   7.  The right atrium is normal in size.   8. The tricuspid valve is structurally normal. There is trace tricuspid   insufficiency.   9. The pulmonic valve is not well-visualized. There is no significant   pulmonic insufficiency.   10. There is mild pulmonary hypertension.   11. No pericardial effusion, intracardiac thrombi, or masses are seen.   Prominent fat pad noted.   12. The atrial septum is structurally normal. No shunt by color flow   doppler.      CONCLUSION:      Technically difficult study.   Normal wall motion estimated LV ejection fraction of 65%.   Grade 1 diastolic dysfunction consistent with impaired relaxation.   Aortic valve sclerosis with borderline to mild aortic stenosis.   Mitral annular calcification with trace to mild mitral regurgitation.   Mild pulmonary hypertension.         Elbert Ewings, MD    02/06/2017 9:34 AM      MRI Brain WO Contrast   Final Result      1. No acute or recent infarct is detected.   2. Mild  subcortical, deep, and periventricular leukomalacia is seen in   both cerebral hemispheres. These findings are most suggestive of mild   chronic small vessel ischemic change.      Luisa Hart  Cinda Quest, MD    02/05/2017 7:33 AM      CT Head without Contrast   Final Result      1. No acute intracranial abnormality is detected. The radiographic   findings discussed above appear chronic.      Otho Ket, MD    02/04/2017 11:15 AM      CT Lumbar Spine WO Contrast   Final Result      1. No acute process is detected.      Otho Ket, MD    02/04/2017 9:44 AM      XR Chest  AP Portable   Final Result    No evidence of acute cardiopulmonary disease.      Darra Lis, MD    02/04/2017 9:20 AM            Patient  Active Problem List    Diagnosis Date Noted   . Generalized weakness 02/04/2017   . Hypoxia 01/07/2017   . Hyponatremia 01/07/2017   . Leukocytosis 01/07/2017   . Anxiety 01/07/2017   . GERD (gastroesophageal reflux disease) 01/07/2017   . HLD (hyperlipidemia) 01/07/2017   . HTN (hypertension) 01/07/2017   . Hypothyroid 01/07/2017   . PMR (polymyalgia rheumatica) 01/07/2017   . OSA (obstructive sleep apnea) 01/07/2017   . Diabetes mellitus, type II 01/07/2017   . Sepsis, due to unspecified organism    . Acute bronchitis due to Rhinovirus 03/29/2016   . COPD exacerbation 03/25/2016   . Acute respiratory failure with hypoxia 03/25/2016   . Lactic acidosis 03/25/2016       Assessment and Plan:    Fever/ SIRS. Check blood cultures X 2 pending. No further fevers. No Leukocytosis.  Negative CXR/UA. PRN Tylenol   Hypokalemia. Replace with 40 Meq of K   Generalized weakness with difficulty ambulation. CT Head is negative. MRI Brain also negative for CVA. Stroke protocol discontinued. Continue aspirin 81 mg daily.    Back pain and spasms with shaking and difficulty ambulation. Will order MRI L spine. PRN Norco. Heating pads. CT L spine is negative. PT recommends SNF. I have ordered MRI L spine   Recent admission with COPD exacerbation improved.  Continue Duonebs scheduled. Resume oxygen during  the night time. Resume Breo   OSA. CPAP at HS ordered   Hypertension. Stable. Resume home meds   Type 2 DM, Uncontrolledon Lantus Insulin and SSI. Increase Lantus to 15units daily. Add pre-meal Humalog TID AC   PMR on Cymbalta 60 mg BID   Nocturnal Hypoxia on nighttime Oxygen.    Hyperlipidemia  Awaiting auth from Insurance to go to SNF      Estimate length of stay 2 days   Code status: fULL   Medical necessity for stay  Carmelia Bake, MD  02/07/2017 1:39 PM  Time spent for evaluation and management and coordination of care

## 2017-02-07 NOTE — Progress Notes (Addendum)
Patient unable to void, bladder scan showed 424 mL, Dr notified. Straight cath per order, patient yielded 370 mL. Samples were sent to lab.

## 2017-02-08 LAB — GFR: EGFR: 43.4

## 2017-02-08 LAB — CBC AND DIFFERENTIAL
Absolute NRBC: 0 10*3/uL
Basophils Absolute Automated: 0.04 10*3/uL (ref 0.00–0.20)
Basophils Automated: 0.6 %
Eosinophils Absolute Automated: 0.12 10*3/uL (ref 0.00–0.70)
Eosinophils Automated: 1.8 %
Hematocrit: 33.7 % — ABNORMAL LOW (ref 37.0–47.0)
Hgb: 11.2 g/dL — ABNORMAL LOW (ref 12.0–16.0)
Immature Granulocytes Absolute: 0.09 10*3/uL — ABNORMAL HIGH
Immature Granulocytes: 1.4 %
Lymphocytes Absolute Automated: 0.71 10*3/uL (ref 0.50–4.40)
Lymphocytes Automated: 10.9 %
MCH: 28.5 pg (ref 28.0–32.0)
MCHC: 33.2 g/dL (ref 32.0–36.0)
MCV: 85.8 fL (ref 80.0–100.0)
MPV: 9 fL — ABNORMAL LOW (ref 9.4–12.3)
Monocytes Absolute Automated: 0.52 10*3/uL (ref 0.00–1.20)
Monocytes: 8 %
Neutrophils Absolute: 5.04 10*3/uL (ref 1.80–8.10)
Neutrophils: 77.3 %
Nucleated RBC: 0 /100 WBC (ref 0.0–1.0)
Platelets: 314 10*3/uL (ref 140–400)
RBC: 3.93 10*6/uL — ABNORMAL LOW (ref 4.20–5.40)
RDW: 16 % — ABNORMAL HIGH (ref 12–15)
WBC: 6.52 10*3/uL (ref 3.50–10.80)

## 2017-02-08 LAB — BASIC METABOLIC PANEL
Anion Gap: 10 (ref 5.0–15.0)
BUN: 14.7 mg/dL (ref 7.0–19.0)
CO2: 30 mEq/L — ABNORMAL HIGH (ref 22–29)
Calcium: 9.2 mg/dL (ref 7.9–10.2)
Chloride: 94 mEq/L — ABNORMAL LOW (ref 100–111)
Creatinine: 1.2 mg/dL — ABNORMAL HIGH (ref 0.6–1.0)
Glucose: 112 mg/dL — ABNORMAL HIGH (ref 70–100)
Potassium: 3.9 mEq/L (ref 3.5–5.1)
Sodium: 134 mEq/L — ABNORMAL LOW (ref 136–145)

## 2017-02-08 LAB — GLUCOSE WHOLE BLOOD - POCT
Whole Blood Glucose POCT: 117 mg/dL — ABNORMAL HIGH (ref 70–100)
Whole Blood Glucose POCT: 169 mg/dL — ABNORMAL HIGH (ref 70–100)
Whole Blood Glucose POCT: 164 mg/dL — ABNORMAL HIGH (ref 70–100)

## 2017-02-08 MED ORDER — INSULIN GLARGINE 100 UNIT/ML SC SOLN
14.0000 [IU] | Freq: Every morning | SUBCUTANEOUS | 0 refills | Status: DC
Start: 2017-02-09 — End: 2017-07-21

## 2017-02-08 MED ORDER — ASPIRIN 81 MG PO CHEW
81.0000 mg | CHEWABLE_TABLET | Freq: Every day | ORAL | 0 refills | Status: DC
Start: 2017-02-09 — End: 2018-04-21

## 2017-02-08 MED ORDER — GADOBUTROL 1 MMOL/ML IV SOLN
7.5000 mL | Freq: Once | INTRAVENOUS | Status: AC | PRN
Start: 2017-02-08 — End: 2017-02-08
  Administered 2017-02-08: 7.5 mmol via INTRAVENOUS

## 2017-02-08 MED ORDER — INSULIN LISPRO 100 UNIT/ML SC SOLN
1.0000 [IU] | Freq: Three times a day (TID) | SUBCUTANEOUS | 3 refills | Status: AC | PRN
Start: 2017-02-08 — End: ?

## 2017-02-08 MED ORDER — ENOXAPARIN SODIUM 40 MG/0.4ML SC SOLN
40.0000 mg | Freq: Every day | SUBCUTANEOUS | 0 refills | Status: DC
Start: 2017-02-08 — End: 2017-07-20

## 2017-02-08 MED ORDER — HYDROCODONE-ACETAMINOPHEN 5-325 MG PO TABS
1.0000 | ORAL_TABLET | ORAL | 0 refills | Status: AC | PRN
Start: 2017-02-08 — End: ?

## 2017-02-08 NOTE — Progress Notes (Signed)
Pt to be discharged to [facility]. Pt daughter in the room to pick her up. Discharge instructions given to the patient and her daughter and both verbalized understanding. Prescription for Norco given to patient daughter which needed to be signed by Dr. Tressie Ellis. Dr. Tressie Ellis stated he will meet pt daughter at [facility] and sign the prescription.   PIV removed. VSS. Pt ate dinner before she leave. Pt escorted off unit on wheelchair by nursing staff. All pt belongings returned to the patient.

## 2017-02-08 NOTE — Progress Notes (Signed)
Pt being discharged to [facility]. Report called in to [facility]. Pt daughter will be here to pick her up.

## 2017-02-08 NOTE — Discharge Summary (Signed)
Internal Medicine Discharge summary      Patient: Rachael [name]  Admission Date: 02/04/2017   DOB: 19-Apr-1936  Discharge Date: 02/08/2017    MRN: [id number]  Discharge Attending: Carmelia Bake, MD   Referring Physician: Carmelia Bake, MD  PCP: Carmelia Bake, MD       DISCHARGE SUMMARY     Discharge Information   Discharge Diagnoses:    Fever/ SIRS. Check blood cultures X 2 negative. No further fevers. No Leukocytosis.  Negative CXR/UA. PRN Tylenol. Flu test negative. MRI L spine with contrast is negative   Hypokalemia. Replace with 10 Meq of K daily. K 3.9   Generalized weakness with difficulty ambulation. CT Head is negative. MRI Brain also negative for CVA. Stroke protocol discontinued. Continue aspirin 81 mg daily.    Back pain and spasms with shaking and difficulty ambulation. MRI L spine negative for acute process. PRN Norco. Heating pads. CT L spine is negative. PT recommends SNF.    Recent admission with COPD exacerbation improved. Continue Atrovent nebs scheduled. Resume oxygen duringthe nighttime. Resume symbicort   OSA. Resume home CPAP at HS     Hypertension. Stable. Resume home meds   Type 2 DM, Uncontrolledon Lantus Insulin and SSI. Increase Lantus to 15units daily. Add pre-meal Humalog TID AC   PMR on Cymbalta 60 mg BID.   Nocturnal Hypoxia on nighttime Oxygen.    Hyperlipidemia on Livalo    Discharge Medications:     Medication List      START taking these medications    aspirin 81 MG chewable tablet  Chew 1 tablet (81 mg total) by mouth daily.  Start taking on:  02/09/2017     enoxaparin 40 MG/0.4ML Soln  Commonly known as:  LOVENOX  Inject 0.4 mLs (40 mg total) into the skin daily.     HYDROcodone-acetaminophen 5-325 MG per tablet  Commonly known as:  NORCO  Take 1 tablet by mouth every 4 (four) hours as needed for Pain.     insulin glargine 100 UNIT/ML injection  Commonly known as:  LANTUS  Inject 14 Units into the skin every morning.  Start taking on:  02/09/2017     insulin  lispro 100 UNIT/ML injection  Commonly known as:  HumaLOG  Inject 1-8 Units into the skin 3 (three) times daily before meals as needed (High blood glucose. See administration instructions.).        CHANGE how you take these medications    guaiFENesin 600 MG 12 hr tablet  Commonly known as:  MUCINEX  Take 1 tablet (600 mg total) by mouth every 12 (twelve) hours.  What changed:  when to take this        CONTINUE taking these medications    dilTIAZem 300 MG 24 hr capsule  Commonly known as:  CARDIZEM CD  Take 1 capsule (300 mg total) by mouth daily.     DULoxetine 60 MG capsule  Commonly known as:  CYMBALTA     gabapentin 300 MG capsule  Commonly known as:  NEURONTIN     hydrALAZINE 25 MG tablet  Commonly known as:  APRESOLINE     ipratropium 0.02 % nebulizer solution  Commonly known as:  ATROVENT     lactobacillus/streptococcus Caps  Take 1 capsule by mouth daily.     levothyroxine 50 MCG tablet  Commonly known as:  SYNTHROID, LEVOTHROID     LIVALO 2 MG Tabs  Generic drug:  Pitavastatin Calcium     losartan-hydrochlorothiazide 100-25 MG per  tablet  Commonly known as:  HYZAAR     metFORMIN 750 MG 24 hr tablet  Commonly known as:  GLUCOPHAGE-XR     metoprolol succinate XL 25 MG 24 hr tablet  Commonly known as:  TOPROL-XL     pantoprazole 40 MG tablet  Commonly known as:  PROTONIX     polyethylene glycol packet  Commonly known as:  MIRALAX  Take 17 g by mouth daily as needed (Constipation).     potassium chloride 10 MEQ tablet  Commonly known as:  K-TAB,KLOR-CON     predniSONE 5 MG tablet  Commonly known as:  DELTASONE     SYMBICORT 160-4.5 MCG/ACT inhaler  Generic drug:  budesonide-formoterol     traZODone 100 MG tablet  Commonly known as:  DESYREL        STOP taking these medications    albuterol-ipratropium 2.5-0.5(3) mg/3 mL nebulizer  Commonly known as:  DUO-NEB     traMADol 50 MG tablet  Commonly known as:  ULTRAM     VICTOZA 18 MG/3ML injection  Generic drug:  liraglutide           Where to Get Your Medications       These medications were sent to [facility] [address], [place] - [address] [address]  [address], [place] [address]    Phone:  [phone]    aspirin 81 MG chewable tablet   enoxaparin 40 MG/0.4ML Soln   insulin glargine 100 UNIT/ML injection   insulin lispro 100 UNIT/ML injection     You can get these medications from any pharmacy    Bring a paper prescription for each of these medications   HYDROcodone-acetaminophen 5-325 MG per tablet             Hospital Course   Hospital Course (2 Days)     Rachael [name] is a 81 y.o. female with the following    Past Medical History:   Diagnosis Date   . Anxiety    . Arthritis    . 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 Chronic cholecystitis with calculus 10/31/2015   . Chronic obstructive pulmonary disease    . Gastroesophageal reflux disease    . Hiatal hernia    . Hyperlipidemia    . Hypertension    . Hypothyroidism    . Incontinent of urine 2014    -WEARS DIAPERS-WILL BRING HER OWN   . Low back pain    . Lymphedema of right lower extremity    . PMR (polymyalgia rheumatica) 01/07/2017   . Polymyalgia rheumatica 09/2014   . Sleep apnea     USES CPAP NIGHTLY   . Type 2 diabetes mellitus, controlled    . Type II or unspecified type diabetes mellitus without mention of complication, not stated as uncontrolled    . Venous insufficiency of right leg 2016    also has lymphedema-KEEPS HER LEGS WRAPPED EXCEPT FOR WHEN SHE BATHES       Past Surgical History:   Procedure Laterality Date   . BACK SURGERY  2014   . CARPAL TUNNEL RELEASE Bilateral 1978   . HYSTERECTOMY  1985   . ROBOTIC, CHOLECYSTECTOMY N/A 10/31/2015    Procedure: ROBOT ASSISTED, LAPAROSCOPIC, CHOLECYSTECTOMY, UMBILICAL HERNIA REPAIR;  Surgeon: Alen Blew, MD;  Location: [facility];  Service: General;  Laterality: N/A;   Rachael [name] is a 81 y.o. female with history  of chronic obstructive pulmonary disease with previous  hospitalizations. She has 20+ pack year smoking history, quit smoking many years ago. Also has been exposed to wood-burning stove in the past. Patient uses oxygen at nighttime only. She also has diagnosis of severe OSA with  On CPAP of 9 cm H2O with good compliance and also uses 2 L of oxygen at nighttime. She also has history of CAD, Back pain, Type 2 DM, Hypertension, GERD and hyperlipidemia. She presented to ER with c/o back spasms and difficulty ambulation for 3 days. CT L spine and CT Head were unremarkable. K 3.9. Rest labs were unremarkable. She denies any focal weakness or numbness anywhere    She presented with acute back pain, fever and gait instability.   Fever/ SIRS. Check blood cultures X 2 negative. No further fevers. No Leukocytosis.  Negative CXR/UA. PRN Tylenol. Flu test negative. MRI L spine with contrast is negative   Hypokalemia. Replace with 10 Meq of K daily. K 3.9   Generalized weakness with difficulty ambulation. CT Head is negative. MRI Brain also negative for CVA. Stroke protocol discontinued. Continue aspirin 81 mg daily.    Back pain and spasms with shaking and difficulty ambulation. MRI L spine negative for acute process. PRN Norco. Heating pads. CT L spine is negative. PT recommends SNF.    Recent admission with COPD exacerbation improved. Continue Atrovent nebs scheduled. Resume oxygen duringthe nighttime. Resume symbicort   OSA. Resume home CPAP at HS     Hypertension. Stable. Resume home meds   Type 2 DM, Uncontrolledon Lantus Insulin and SSI. Increase Lantus to 15units daily. Add pre-meal Humalog TID AC   PMR on Cymbalta 60 mg BID.   Nocturnal Hypoxia on nighttime Oxygen.    Hyperlipidemia on Livalo    Procedures/Imaging:   MRI Lumbar Spine W WO Contrast   Final Result      1. Postoperative changes.   2. More focal disc herniations at the L2-L3 and L3-L4 levels.      Terrilee Croak, MD    02/08/2017 9:49 AM      XR Chest 2 Views   Final Result         1.   Mild left lung base atelectasis, unchanged. No focal infiltrate or   consolidation.      Candi Leash, MD    02/06/2017 7:32 PM      Echocardiogram 2D complete   Final Result         1.  The quality of the study is technically difficult but adequate for   interpretation.   2.  The left ventricle is normal in size. Left ventricular systolic   function is normal. There are no regional wall motion abnormalities.   Estimated EF is 65%. There is no left ventricular hypertrophy. Grade 1   diastolic dysfunction consistent with impaired relaxation.   3.  The left atrium is normal in size.   4.  The aortic valve is not felt visualized but probably trileaflet and   sclerotic. There is no significant aortic insufficiency. Borderline to   mild aortic stenosis is present. Peak gradient of 31 with a mean   gradient of 15 mmHg across aortic valve. The aortic root is normal in   size.   5.  The mitral valve is structurally normal but sclerotic. Trace to mild   mitral insufficiency is present. Mitral annular calcification noted.   6.  The right ventricle is normal in size and function.  7.  The right atrium is normal in size.   8. The tricuspid valve is structurally normal. There is trace tricuspid   insufficiency.   9. The pulmonic valve is not well-visualized. There is no significant   pulmonic insufficiency.   10. There is mild pulmonary hypertension.   11. No pericardial effusion, intracardiac thrombi, or masses are seen.   Prominent fat pad noted.   12. The atrial septum is structurally normal. No shunt by color flow   doppler.      CONCLUSION:      Technically difficult study.   Normal wall motion estimated LV ejection fraction of 65%.   Grade 1 diastolic dysfunction consistent with impaired relaxation.   Aortic valve sclerosis with borderline to mild aortic stenosis.   Mitral annular calcification with trace to mild mitral regurgitation.   Mild pulmonary hypertension.         Elbert Ewings, MD    02/06/2017 9:34 AM      MRI Brain WO  Contrast   Final Result      1. No acute or recent infarct is detected.   2. Mild  subcortical, deep, and periventricular leukomalacia is seen in   both cerebral hemispheres. These findings are most suggestive of mild   chronic small vessel ischemic change.      Theodoro Doing, MD    02/05/2017 7:33 AM      CT Head without Contrast   Final Result      1. No acute intracranial abnormality is detected. The radiographic   findings discussed above appear chronic.      Otho Ket, MD    02/04/2017 11:15 AM      CT Lumbar Spine WO Contrast   Final Result      1. No acute process is detected.      Otho Ket, MD    02/04/2017 9:44 AM      XR Chest  AP Portable   Final Result    No evidence of acute cardiopulmonary disease.      Darra Lis, MD    02/04/2017 9:20 AM          Treatment Team:   Attending Provider: Carmelia Bake, MD         Progress Note/Physical Exam at Discharge     Subjective: feels better clinically    Vitals:    02/08/17 0454 02/08/17 0803 02/08/17 1045 02/08/17 1341   BP: 128/82  131/85 90/55   Pulse: 97 (!) 104 99 77   Resp: 17 18 18 20    Temp: 97.9 F (36.6 C)  98.6 F (37 C) 97.3 F (36.3 C)   TempSrc: Temporal Artery  Oral    SpO2: 97%  95% 98%   Weight: 77.7 kg (171 lb 3.2 oz)      Height:           General: NAD, AAOx3  HEENT: perrla, eomi, sclera anicteric, OP: Clear, MMM  Neck: supple, FROM, no LAD  Cardiovascular: RRR, no m/r/g  Lungs: CTAB, no w/r/r  Abdomen: soft, +BS, NT/ND, no masses, no g/r  Extremities: no C/C/E  Skin: no rashes or lesions noted       Diagnostics     Labs/Studies Pending at Discharge: No    Last Labs     Recent Labs  Lab 02/08/17  0619 02/07/17  1415 02/06/17  1941   WBC 6.52 5.42 7.51   RBC 3.93* 3.85* 4.12*   Hgb 11.2*  10.9* 11.7*   Hematocrit 33.7* 32.9* 35.0*   MCV 85.8 85.5 85.0   Platelets 314 312 313         Recent Labs  Lab 02/08/17  0619 02/06/17  1941 02/04/17  1634 02/04/17  0910   Sodium 134* 132* 136 134*   Potassium 3.9 3.0* 3.3* 3.8    Chloride 94* 92* 95* 94*   CO2 30* 28 31* 26   BUN 14.7 13.6 10.2 12.0   Creatinine 1.2* 0.8 0.8 1.0   Glucose 112* 193* 212* 190*   Calcium 9.2 9.4 9.6 10.3*        Patient Instructions   Discharge Diet: cardiac diet and diabetic diet  Discharge Activity:  activity as tolerated    Follow Up Appointment:  Follow-up Information     Carmelia Bake, MD. Schedule an appointment as soon as possible for a visit in 2 days.    Specialty:  Internal Medicine  Contact information:  [phone] [facility] [address]  [place] [address]  [phone]                    Time spent examining patient, discussing with patient/family regarding hospital course, chart review, reconciling medications and discharge planning: 45 minutes.    Signed,  Carmelia Bake, MD  2:29 PM 02/08/2017     Cc: PCP and all consultants on listed above

## 2017-02-08 NOTE — Progress Notes (Signed)
Patient going to [facility], room 112. Report to be given at [phone]. This CM spoke with daughter Lanora Manis who stated will pick up the patient around 4 pm.   Bedside nurse notified

## 2017-02-08 NOTE — UM Notes (Signed)
Hello    REF# [id number]  Admission clinicals for ED/OBS admit 02/04/17 then changed to INPATIENT 02/06/17 (copy of MD order below) with update/overview for 02/07/17  C/b to Wynona Canes [phone]    Thank-you!          COPY of MD order for INPATIENT status on 02/06/17 :  02/06/17 1819  Admit to Inpatient Once    Status:    Question Answer Comment   Admitting Physician Carmelia Bake    Diagnosis Generalized weakness    Estimated Length of Stay > or = to 2 midnights    Tentative Discharge Plan? Home or Self Care    Patient Class Inpatient                81y/o female came into ER on 3/22 and admitted OBS to medical on same day. Changed to inpatient 02/06/17    Pnt w lower back pain with bilateral leg pain occurring in spasms intermittently over the last 3-4 days. States pain is typically worse at night with associated nausea. Has a history of chronic obstructive pulmonary disease and was post reduction last episode did not take it last night and was found to be having low oxygen levels at 84 percent when EMS arrived.     Past Medical History        Past Medical History:   Diagnosis Date   . Anxiety    . Arthritis    . 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 Chronic cholecystitis with calculus 10/31/2015   . Chronic obstructive pulmonary disease    . Gastroesophageal reflux disease    . Hiatal hernia    . Hyperlipidemia    . Hypertension    . Hypothyroidism    . Incontinent of urine 2014    -WEARS DIAPERS-WILL BRING HER OWN   . Low back pain    . Lymphedema of right lower extremity    . PMR (polymyalgia rheumatica) 01/07/2017   . Polymyalgia rheumatica 09/2014   . Sleep apnea     USES CPAP NIGHTLY   . Type 2 diabetes mellitus, controlled    . Type II or unspecified type diabetes mellitus without mention of complication, not stated as uncontrolled    . Venous insufficiency of right leg 2016    also has lymphedema-KEEPS HER LEGS WRAPPED EXCEPT FOR WHEN SHE BATHES        VS: 98.6;  p105; 93%2L; r20; 140/64; 9/10 pain     ER meds  IV zofran   IV morphine   PO aspirin     Abn Labs  u-cloudy   Glu 190  NA 134   Cl 94   CA 10.3   Alb 3.4  CT head: 1. No acute intracranial abnormality is detected. The radiographic  findings discussed above appear chronic.  CT Lspine: no acute abn   CXR: no acute abn   SINUS TACHYCARDIA  ST & T WAVE ABNORMALITY, CONSIDER ANTEROLATERAL ISCHEMIA  ABNORMAL ECG  WHEN COMPARED WITH ECG OF 25-Mar-2016 15:10,  SINUS RHYTHM HAS REPLACED JUNCTIONAL RHYTHM  INVERTED T WAVES HAVE REPLACED NONSPECIFIC T WAVE ABNORMALITY IN Anterolateral leads    Pnt being admitted OBS to medical for [facility]  Assessment and Plan per medical 02/04/17:  IMPRESSION AND PLAN:    Generalized weakness with difficulty ambulation. CT Head is negative. MRI Brain ordered. Stroke protocol initiated. Continue aspirin 81 mg daily. Neuro checks per protocol   Back pain and spasms.  PRN Norco. Heating pads. CT L spine is negative   Recent admission with COPD exacerbation improved. ContinueDuonebs scheduled. Resume oxygen duringthe nighttime. Resume Breo   OSA. CPAP at HS ordered   Hypertension. Stable. Resume home meds   Type 2 DM, Uncontrolledon Lantus Insulin and SSI. Increase Lantus to 15units daily. Add pre-meal Humalog TID AC   PMR on Cymbalta 60 mg BID   Nocturnal Hypoxia on nighttime Oxygen.    Hyperlipidemia    Orders:  Sq lovenox  ECHO  MRI angio head/neck  MRI brain   NPO   Telemetry monitoring   Vs w neuro q4   PT/OT             Changed to INPATIENT 02/06/17 for SIRS with fever and tachycardia :    ST on monitor.    + Chills    VS: Temp to 101.3, p 110, SATS 90% RA, resp 22, bp 103/66, pain 8/10  Labs: hgb 10.9, hct 32.9, rbc 3.85, Glucose 193, Na 132, K 3.0      Assessment and Plan per medical 02/06/17 :   Assessment and Plan:    Fever/ SIRS. Check blood cultures X 2. CBC, CMP STAT. Check CXR/UA. PRN Tylenol   Generalized weakness with difficulty ambulation. CT Head is  negative. MRI Brain also negative for CVA. Stroke protocol discontinued. Continue aspirin 81 mg daily.    Back pain and spasms. PRN Norco. Heating pads. CT L spine is negative. PT recommends SNF   Recent admission with COPD exacerbation improved. ContinueDuonebs scheduled. Resume oxygen duringthe nighttime. Resume Breo   OSA. CPAP at HS ordered   Hypertension. Stable. Resume home meds   Type 2 DM, Uncontrolledon Lantus Insulin and SSI. Increase Lantus to 15units daily. Add pre-meal Humalog TID AC   PMR on Cymbalta 60 mg BID   Nocturnal Hypoxia on nighttime Oxygen.    Hyperlipidemia        Orders:   Aspirin chewable  cardizem po, llipitor, cymbalta, neurontin, mucinex, hydralazine, losartan, hctz, risaquad, synthyroid, metoprolol, protonix, trazodone  lovenox sc 40mg  daily  breo ellipta inhale  Apresoline bid  Insulin sc  atrovent nebs q 6 hrs  MRI lumbar spine  cardicac diet  CPAP q hs  O2 5 lpm or less  Nebs q 6 hrs  OT/PT  resp re assess bid  Straight cath  Tele  VS and Pulse Ox routine  Prn Tylenol, prn IV Hydralazine, prn Norco, prn IV Zofran  SS Insulin          On tele 02/07/17 :  ST on monitor    T 100.9, HR to 109, RR to 24, BP 103/66, sats 90% RA - sats to 92% O2 2L NC  Glucose 171 - 290s, H&H 10.9/32.9, RBC 3.85    MD assessment/plan 02/07/17 :   Fever/ SIRS. Check blood cultures X 2 pending. Still febrile. No Leukocytosis.  Negative CXR/UA. PRN Tylenol   Hypokalemia. Replaced with 40 Meq of K. Monitor.   Generalized weakness with difficulty ambulation. CT Head is negative. MRI Brain also negative for CVA. Stroke protocol discontinued. Continue aspirin 81 mg daily. PTOT.   Back pain and spasms with shaking and difficulty ambulation. Will order MRI L spine. PRN Norco. Heating pads. CT L spine is negative. PT recommends SNF. I have ordered MRI L spine   Recent admission with COPD exacerbation improved. ContinueDuonebs scheduled. Resume oxygen duringthe nighttime. Resume Breo   OSA.  CPAP at HS ordered   Hypertension. Stable. Resume  home meds   Type 2 DM, Uncontrolledon Lantus Insulin and SSI. Increase Lantus to 15units daily. Add pre-meal Humalog TID AC   PMR on Cymbalta 60 mg BID   Nocturnal Hypoxia on nighttime Oxygen. Resp insuff noted today with tachypnea and mild hypoxemia req O2 2L for sats > 90%.   Hyperlipidemia

## 2017-02-08 NOTE — OT Progress Note (Signed)
[facility]  [address]  [place], [place]. [address]    Department of Rehabilitation Services  [phone]    Occupational Therapy Treatment Note       Patient:  Rachael [name] MRN#:  [id number]  [facility] PROGRESSIVE CARE UNIT M204/M204-A    Time of treatment: Start Time: 1021 Stop Time: 1048   Time Calculation (min): 27 min         Precautions and Contraindications:  Falls Risk        Assessment: Patient continues to present with the following deficits: decreased strength;balance deficits;decreased independence with ADLs;decreased safety awareness;decreased independence with IADLs;decreased endurance/activity tolerance. Patient requires intermittent rest breaks during the session due to fatigue levels.  Patient would benefit from continued skilled OT treatment to maximize independence and safety with ADL's, functional transfers and mobility.     Prognosis: Good;With continued OT s/p acute discharge  Progress: Progressing toward goals    Patient Goal  Patient Goal:  (to go to rehab)      Plan: Continue with Occupational therapy services in acute care to address increasing independence and safety with ADL's.  . Focus next therapy session on LB dressing skills with use of A.E.; toilet transfers.      OT Plan  Risks/Benefits/POC Discussed with Pt/Family: With patient  Treatment Interventions: ADL retraining;Functional transfer training;Endurance training;UE strengthening/ROM;Patient/Family training;Neuro muscular reeducation  Discharge Recommendation: SNF  OT Frequency Recommended: 3-4x/wk  OT - Next Visit Recommendation: 02/09/17                                    Based on today's session patient's discharge recommendation is the following: Discharge Recommendation: SNF.                   Subjective: Patient's medical condition is appropriate for Occupational Therapy intervention at this time.  Patient is agreeable to participation in the therapy session. Nursing clears patient for therapy.  Patient denies pain currently. States she had a fever last night but is feeling better today.   Pain Assessment  Pain Assessment: No/denies pain    Objective:Observation of Patient/Vital Signs:  Patient is in bed with telemetry and peripheral IV, O2 at 3 liters/minute via nasal cannula in place.    Cognition/Neuro Status  Arousal/Alertness: Appropriate responses to stimuli  Attention Span: Appears intact  Orientation Level: Oriented X4  Memory: Appears intact  Following Commands: Follows all commands and directions without difficulty  Safety Awareness: minimal verbal instruction  Insights: Fully aware of deficits  Behavior: attentive;calm;cooperative    Functional Mobility  Supine to Sit Transfers: Supervision;to right (HOB elevated)  Sit to Stand Transfers: Contact Guard Assist (from bed)  Stand to Sit Transfers: Contact Guard Assist (for eccentric control)  Bed to Toilet Transfers: Contact Guard Assist (w/ 3 ww)  Toilet to Arm-chair Transfers: CG A w/ rw  -verbal/tactile instructions for proper hand placement and pacing with all functional transfers for increased safety.       Self-care and Home Management  Grooming: Supervision;standing at sink;supervision/safety;wash/dry hands;standing with assistive device  LB Dressing: Minimal Assist;sitting;edge of bed;Steadying;Verbal prompting;Setup;Increased time to complete;Don/doff R sock;Don/doff L sock (decreased dynamic reaching towards feet)  Toileting: Minimal Assist;standing;sitting;steadying;verbal prompting;clothing management down;grab bar use;clothing management up  Functional Transfers: Minimal Assist;toilet transfer;steadying;verbal prompting  Treatment Activities: Educated in pursed lip breathing techniques via verbal/visual demonstration for increased clarity due to SOB noted with exertion.  Patient instructed in proper pacing with transitional mvmts, encouraging Patient to wait atleast 60 secs. To notice how she feels  (lightheaded or dizzy) before attempting to stand/walk for fall prevention. Patient verbalized understanding of same.  Patient educated on safe rw use in small spaces in bathroom including keeping rw infront of self upon approaching sinkside to engage in grooming tasks for increased safety. Patient educated in importance of OOB sitting for all meals to increase endurance/strength for activities and for pulmonary hygiene. Pt. Instructed to perform B/L UE AROM exercises for shoulder, elbow and digit F/E intermittently throughout the day to increase endurance and strength for ADL's with visual demonstration provided to increase clarity.Patient seated in arm-chair at end of session with all needs within reach. Patient instructed to ring for nursing for all needs and appeared receptive to all education provided.     Educated the patient to role of occupational therapy, plan of care, goals of therapy and safety with mobility and ADLs, home safety.    Patient left without needs and call bell within reach. Chair Alarm set.  RN notified of session outcome.        Tennis Ship. Trixie Deis, MS,OTR/L  Pager # [phone]  [phone]

## 2017-02-08 NOTE — Plan of Care (Signed)
[employer] O2 tank delivered for patient trip home.

## 2017-02-08 NOTE — PT Progress Note (Signed)
[facility]  [address]  [place], [place]. [address]    Department of Rehabilitation  [phone]    Physical Therapy Daily Treatment Note    Patient: Rachael [name]    MRN#: [id number]   M204/M204-A  Time of Treatment: Start Time: 0940 Stop Time: 1019 Time Calculation: 39 min  PT Visit Number: 2  Patient's medical condition is appropriate for Physical Therapy intervention at this time.  Precautions: Falls      Assessment:  Pt required therapeutic rest breaks throughout session due to shortness of breath with activity.  Reviewed proper pacing throughout session for energy conservation and to minimize SOB. Pt will benefit from continued in-patient PT to address the following deficits:  Decreased endurance/activity tolerance; Decreased functional mobility; Decreased balance; Gait impairment; Decreased LE strength     Progress: Progressing toward goals   Patient Goal: To return home      Plan:  Continue with Physical Therapy services to address functional mobility, endurance, and strength deficits.  Focus next session on: Bed mobility;Functional transfer training;LE strengthening/ROM;Neuromuscular re-education;Exercise;Gait training;Endurance training;Equipment eval/education   PT Frequency: 3-4x/wk   Based on today's session patient's discharge recommendation is the following:  SNF.   If SNF is not available, then the patient will need home health services, increase supervision, assistance with mobility and assistance with ADL's.             Subjective: Patient is agreeable to participation in the therapy session, reports she moves her legs and does the exercises t/o the day when watching tv.  Nursing clears patient for therapy.   Pain Assessment: Numeric Scale (0-10)  Pain Score: mild pain, stated pain is "nothing new"  POSS Score: Slightly drowsy, easily aroused  Pain Location: Leg (chronic)  Pain Intervention(s): Ambulation/increased activity;Repositioned (eports RN recently administered pain  medication )       Objective:  Observation of Patient/Vital Signs:  patient vital signs performed by nursing staff, see flow chart for details.  Patient is seated in a bedside chair with telemetry and O2 at 3 liters/minute via nasal cannula in place.  Pt seen for functional activities and exercises as noted:     Cognition/Neuro Status  Arousal/Alertness: Appropriate responses to stimuli  Attention Span: Appears intact  Orientation Level: Oriented X4  Memory: Appears intact  Following Commands: Follows all commands and directions without difficulty  Safety Awareness: minimal verbal instruction  Insights: Educated in Engineer, building services  Behavior: attentive;calm;cooperative  Motor Planning: intact    Functional Mobility  Sit to Supine: Contact Guard Assist;to Left  Sit to Stand: Minimal Assist;Contact Guard Assist  Stand to Sit: Minimal Assist     Locomotion  Ambulation: Minimal Assist with front-wheeled walker with pt's own three wheeled walker, short shuffling steps, 3-4 short standing rests, pt picked up the pace last half of gait bout, required assistance to slow pace and instructions to be mindful of shortness of breath.   Pattern: shuffle  Distance Walked (Step 7): 80 Feet    Seated Therapeutic Exercise  Ankle Pumps:  B x 25-30   LAQ:  B x 10  Hip Flexion/Marching: B x 10  Hip Abduction/Adduction:  B x 10  Pt instructed in technique and pacing for all LE there ex performed as documented above with focus and facilitation on correct LE positioning and proper cadence to maximize quality of each exercise. Verbal instruction for proper execution of there-ex to obtain maximum benefit.    Neuro Re-Ed  Sitting Balance: supervision at edge of  bed  Standing Balance: minimal assist;with support;with instruction;dynamic gait training;variable environment/backgrounds     Treatment Activities:  Bed mobility, transfers, balance assessment, LE ROM and strength ex's, safety awareness and cognition assessed, and activity tolerance  noted.      Educated the patient to role of physical therapy, plan of care, goals of therapy, safety with mobility and energy conservation techniques.  Encouraged to continue to perform AE exercises frequently for circulation, and to perform LE therapeutic exercise  2-3 times throughout the day to decrease effects of immobility.  Encouraged to sit OOB as tolerated for pulmonary hygiene.  Instructed patient to call for assistance and to not get up without assistance for safety. Pt voiced understanding.  Patient left without needs and call bell within reach. Chair alarm set.  RN notified of session outcome.

## 2017-02-08 NOTE — Progress Notes (Signed)
After Visit Summary      Margarette C. Wignall EVO:[id number]   02/04/2017 - 02/08/2017  [facility]  [facility]  [phone]   Things To Do     Do        Pick up these medications from [facility] [facility] [place], [place] - [address]    aspirin    enoxaparin    insulin glargine    insulin lispro      Pick up these medications from any pharmacy with your printed prescription    HYDROcodone-acetaminophen      Schedule an appointment with Carmelia Bake, MD as soon as possible for a visit in 2 day(s)    [facility]   [phone]   After Visit Summary   Most Important        About Your Stay       Reason for your Hospital Admission:  Acute Back pain  Gait instability  Febrile illness with leukocytosis resolved  COPD/ Chronic hypoxia      Instructions for after your discharge:  Discharge to SNF  Follow up with [facility]       Things To Do   Do   insulin glargine   insulin lispro   Doctor in charge of your hospital stay     Carmelia Bake, MD        Phone: [phone]     Activity instructions     Activity as tolerated       Diet instructions     Diet cardiac    A Cardiac Diet:  1. Limits salt in your diet. Salt (sodium) makes your body hold water.   2. Limits fluid in your diet. In some patients, drinking too much fluid can worsen heart failure.  3. Limits foods high in animal fat.    Excess salt and fluid can cause symptoms such as shortness of breath and swelling.     Remember, these are GUIDELINES ONLY. Please consult with your primary care provider or cardiologist for further instructions.   The following tests are still processing. Please ask your doctor.     Order Current Status   Culture Blood Aerobic and Anaerobic Preliminary result   What's next     What's next    Schedule an appointment with Carmelia Bake, MD as soon as possible for a visit in 2 day(s)  [facility]   [phone]  [facility] [address]   [place] [address]   [phone]    You are allergic to the following     You are allergic to the following   Allergen Reactions   Tolectin (Tolmetin) Swelling       Lyrica (Pregabalin) Not Noted       Latex Itching   Immunizations Administered for This Admission     No immunizations on file.   Medication List Instructions     Medication Lists help reduce medication errors and help prevent harmful drug interactions. Below is your Discharge Medication List that was reviewed by the physician today. Please maintain and update your medication list and share it with your health care providers at every visit.  Discharge Medication List     STOP taking these medications     STOP taking these medications   albuterol-ipratropium 2.5-0.5(3) mg/3 mL nebulizer   Common Name: DUO-NEB       traMADol 50 MG tablet   Common Name: Janean Sark  VICTOZA 18 MG/3ML injection   Generic drug: liraglutide       TAKE these medications     TAKE these medications    Instructions AM Noon PM Bed As Needed   aspirin 81 MG chewable tablet   Start Date: 02/09/2017     Chew 1 tablet (81 mg total) by mouth daily.              dilTIAZem 300 MG 24 hr capsule   Common Name: CARDIZEM CD     Take 1 capsule (300 mg total) by mouth daily.              DULoxetine 60 MG capsule   Common Name: CYMBALTA     Take 60 mg by mouth 2 (two) times daily.              enoxaparin 40 MG/0.4ML Soln   Common Name: LOVENOX     Inject 0.4 mLs (40 mg total) into the skin daily.              gabapentin 300 MG capsule   Common Name: NEURONTIN     Take 900 mg by mouth 2 (two) times daily.              guaiFENesin 600 MG 12 hr tablet   Common Name: MUCINEX   What changed: when to take this     Take 1 tablet (600 mg total) by mouth every 12 (twelve) hours.              hydrALAZINE 25 MG tablet   Common Name: APRESOLINE     Take 1 tablet by mouth 2 (two) times daily.              HYDROcodone-acetaminophen  5-325 MG per tablet   Common Name: NORCO     Take 1 tablet by mouth every 4 (four) hours as needed for Pain.              insulin glargine 100 UNIT/ML injection   Common Name: LANTUS   Start Date: 02/09/2017     Inject 14 Units into the skin every morning.              insulin lispro 100 UNIT/ML injection   Common Name: HumaLOG     Inject 1-8 Units into the skin 3 (three) times daily before meals as needed (High blood glucose. See administration instructions.).              ipratropium 0.02 % nebulizer solution   Common Name: ATROVENT     Take 500 mcg by nebulization 4 (four) times daily.              lactobacillus/streptococcus Caps     Take 1 capsule by mouth daily.              levothyroxine 50 MCG tablet   Common Name: SYNTHROID, LEVOTHROID     Take 50 mcg by mouth Once a day at 6:00am.              LIVALO 2 MG Tabs   Generic drug: Pitavastatin Calcium     Take 1 tablet by mouth nightly.              losartan-hydrochlorothiazide 100-25 MG per tablet   Common Name: HYZAAR     Take 1 tablet by mouth every morning.              metFORMIN 750 MG 24 hr tablet  Common Name: GLUCOPHAGE-XR     Take 750 mg by mouth nightly.              metoprolol succinate XL 25 MG 24 hr tablet   Common Name: TOPROL-XL     Take 50 mg by mouth daily.              pantoprazole 40 MG tablet   Common Name: PROTONIX     Take 40 mg by mouth daily.              polyethylene glycol packet   Common Name: MIRALAX     Take 17 g by mouth daily as needed (Constipation).              potassium chloride 10 MEQ tablet   Common Name: K-TAB,KLOR-CON     Take 10 mEq by mouth daily.              predniSONE 5 MG tablet   Common Name: DELTASONE     Take 5 mg by mouth daily.              SYMBICORT 160-4.5 MCG/ACT inhaler   Generic drug: budesonide-formoterol     Inhale 2 puffs into the lungs 2 (two) times daily.              traZODone 100 MG tablet   Common Name: DESYREL      Take 50 mg by mouth nightly.                    Where to pick up your medications        [facility] at [facility] [address], [place] - [address]      aspirin . enoxaparin . insulin glargine . insulin lispro       Address: [address] [place] [address]   Phone: [phone]      Pick up these medications from any pharmacy with your printed prescription      HYDROcodone-acetaminophen

## 2017-02-08 NOTE — Discharge Instr - AVS First Page (Signed)
Reason for your Hospital Admission:  Acute Back pain  Gait instability  Febrile illness with leukocytosis resolved  COPD/ Chronic hypoxia      Instructions for after your discharge:  Discharge to SNF  Follow up with [facility]

## 2017-02-08 NOTE — Plan of Care (Signed)
Problem: Safety  Goal: Patient will be free from injury during hospitalization  Outcome: Progressing   02/07/17 2358   Goal/Interventions addressed this shift   Patient will be free from injury during hospitalization  Assess patient's risk for falls and implement fall prevention plan of care per policy;Provide and maintain safe environment;Use appropriate transfer methods;Ensure appropriate safety devices are available at the bedside;Include patient/ family/ care giver in decisions related to safety;Hourly rounding;Assess for patients risk for elopement and implement Elopement Risk Plan per policy   Bed locked in lowest position, pt in chair with chair alarm activated, call bella nd personal belongings within pt access, RN instructed pt to call for help when needed, pt verbalized understanding. Hourly rounding ongoing.

## 2017-02-11 ENCOUNTER — Ambulatory Visit: Payer: No Typology Code available for payment source | Attending: Internal Medicine

## 2017-02-11 ENCOUNTER — Other Ambulatory Visit: Payer: Self-pay | Admitting: Internal Medicine

## 2017-02-11 DIAGNOSIS — J449 Chronic obstructive pulmonary disease, unspecified: Secondary | ICD-10-CM

## 2017-02-11 LAB — BASIC METABOLIC PANEL
Anion Gap: 9 (ref 5.0–15.0)
BUN: 11 mg/dL (ref 7.0–19.0)
CO2: 32 mEq/L — ABNORMAL HIGH (ref 22–29)
Calcium: 9.3 mg/dL (ref 7.9–10.2)
Chloride: 98 mEq/L — ABNORMAL LOW (ref 100–111)
Creatinine: 0.8 mg/dL (ref 0.6–1.0)
Glucose: 171 mg/dL — ABNORMAL HIGH (ref 70–100)
Potassium: 3.6 mEq/L (ref 3.5–5.1)
Sodium: 139 mEq/L (ref 136–145)

## 2017-02-11 LAB — GFR: EGFR: >60

## 2017-02-15 ENCOUNTER — Ambulatory Visit: Payer: No Typology Code available for payment source | Attending: Internal Medicine

## 2017-02-15 ENCOUNTER — Other Ambulatory Visit: Payer: Self-pay | Admitting: Internal Medicine

## 2017-02-15 DIAGNOSIS — Z5181 Encounter for therapeutic drug level monitoring: Secondary | ICD-10-CM

## 2017-02-15 DIAGNOSIS — Z7901 Long term (current) use of anticoagulants: Secondary | ICD-10-CM

## 2017-02-15 LAB — CBC
Absolute NRBC: 0 10*3/uL
Hematocrit: 31.9 % — ABNORMAL LOW (ref 37.0–47.0)
Hgb: 10.4 g/dL — ABNORMAL LOW (ref 12.0–16.0)
MCH: 28.3 pg (ref 28.0–32.0)
MCHC: 32.6 g/dL (ref 32.0–36.0)
MCV: 86.7 fL (ref 80.0–100.0)
MPV: 8.7 fL — ABNORMAL LOW (ref 9.4–12.3)
Nucleated RBC: 0 /100 WBC (ref 0.0–1.0)
Platelets: 439 10*3/uL — ABNORMAL HIGH (ref 140–400)
RBC: 3.68 10*6/uL — ABNORMAL LOW (ref 4.20–5.40)
RDW: 16 % — ABNORMAL HIGH (ref 12–15)
WBC: 9.6 10*3/uL (ref 3.50–10.80)

## 2017-02-15 LAB — BASIC METABOLIC PANEL
Anion Gap: 10 (ref 5.0–15.0)
BUN: 8 mg/dL (ref 7.0–19.0)
CO2: 31 mEq/L — ABNORMAL HIGH (ref 22–29)
Calcium: 10.2 mg/dL (ref 7.9–10.2)
Chloride: 100 mEq/L (ref 100–111)
Creatinine: 0.7 mg/dL (ref 0.6–1.0)
Glucose: 112 mg/dL — ABNORMAL HIGH (ref 70–100)
Potassium: 3.6 mEq/L (ref 3.5–5.1)
Sodium: 141 mEq/L (ref 136–145)

## 2017-02-15 LAB — GFR: EGFR: >60

## 2017-03-01 ENCOUNTER — Ambulatory Visit: Payer: No Typology Code available for payment source | Attending: Internal Medicine

## 2017-03-01 ENCOUNTER — Other Ambulatory Visit: Payer: Self-pay | Admitting: Internal Medicine

## 2017-03-01 DIAGNOSIS — Z7901 Long term (current) use of anticoagulants: Secondary | ICD-10-CM

## 2017-03-01 DIAGNOSIS — Z5181 Encounter for therapeutic drug level monitoring: Secondary | ICD-10-CM | POA: Insufficient documentation

## 2017-03-01 LAB — CBC
Absolute NRBC: 0 10*3/uL
Hematocrit: 35.9 % — ABNORMAL LOW (ref 37.0–47.0)
Hgb: 11.3 g/dL — ABNORMAL LOW (ref 12.0–16.0)
MCH: 28.1 pg (ref 28.0–32.0)
MCHC: 31.5 g/dL — ABNORMAL LOW (ref 32.0–36.0)
MCV: 89.3 fL (ref 80.0–100.0)
MPV: 9.2 fL — ABNORMAL LOW (ref 9.4–12.3)
Nucleated RBC: 0 /100 WBC (ref 0.0–1.0)
Platelets: 327 10*3/uL (ref 140–400)
RBC: 4.02 10*6/uL — ABNORMAL LOW (ref 4.20–5.40)
RDW: 17 % — ABNORMAL HIGH (ref 12–15)
WBC: 10.46 10*3/uL (ref 3.50–10.80)

## 2017-03-10 ENCOUNTER — Ambulatory Visit: Payer: No Typology Code available for payment source | Attending: Internal Medicine

## 2017-03-10 ENCOUNTER — Other Ambulatory Visit: Payer: Self-pay | Admitting: Internal Medicine

## 2017-03-10 DIAGNOSIS — Z7901 Long term (current) use of anticoagulants: Secondary | ICD-10-CM

## 2017-03-10 LAB — BASIC METABOLIC PANEL
Anion Gap: 12 (ref 5.0–15.0)
BUN: 11 mg/dL (ref 7.0–19.0)
CO2: 26 mEq/L (ref 22–29)
Calcium: 9.1 mg/dL (ref 7.9–10.2)
Chloride: 104 mEq/L (ref 100–111)
Creatinine: 0.7 mg/dL (ref 0.6–1.0)
Glucose: 122 mg/dL — ABNORMAL HIGH (ref 70–100)
Potassium: 4.1 mEq/L (ref 3.5–5.1)
Sodium: 142 mEq/L (ref 136–145)

## 2017-03-10 LAB — CBC AND DIFFERENTIAL
Absolute NRBC: 0 10*3/uL
Basophils Absolute Automated: 0.08 10*3/uL (ref 0.00–0.20)
Basophils Automated: 0.8 %
Eosinophils Absolute Automated: 0.31 10*3/uL (ref 0.00–0.70)
Eosinophils Automated: 3.1 %
Hematocrit: 35.6 % — ABNORMAL LOW (ref 37.0–47.0)
Hgb: 11.3 g/dL — ABNORMAL LOW (ref 12.0–16.0)
Immature Granulocytes Absolute: 0.04 10*3/uL
Immature Granulocytes: 0.4 %
Lymphocytes Absolute Automated: 2.03 10*3/uL (ref 0.50–4.40)
Lymphocytes Automated: 20.3 %
MCH: 28 pg (ref 28.0–32.0)
MCHC: 31.7 g/dL — ABNORMAL LOW (ref 32.0–36.0)
MCV: 88.1 fL (ref 80.0–100.0)
MPV: 9.1 fL — ABNORMAL LOW (ref 9.4–12.3)
Monocytes Absolute Automated: 0.56 10*3/uL (ref 0.00–1.20)
Monocytes: 5.6 %
Neutrophils Absolute: 6.98 10*3/uL (ref 1.80–8.10)
Neutrophils: 69.8 %
Nucleated RBC: 0 /100 WBC (ref 0.0–1.0)
Platelets: 302 10*3/uL (ref 140–400)
RBC: 4.04 10*6/uL — ABNORMAL LOW (ref 4.20–5.40)
RDW: 16 % — ABNORMAL HIGH (ref 12–15)
WBC: 10 10*3/uL (ref 3.50–10.80)

## 2017-03-10 LAB — GFR: EGFR: >60

## 2017-05-27 ENCOUNTER — Encounter (INDEPENDENT_AMBULATORY_CARE_PROVIDER_SITE_OTHER): Payer: Self-pay | Admitting: Surgery

## 2017-05-27 ENCOUNTER — Ambulatory Visit (INDEPENDENT_AMBULATORY_CARE_PROVIDER_SITE_OTHER): Payer: No Typology Code available for payment source | Admitting: Surgery

## 2017-05-27 VITALS — BP 118/67 | HR 88 | Wt 176.0 lb

## 2017-05-27 DIAGNOSIS — M7989 Other specified soft tissue disorders: Secondary | ICD-10-CM

## 2017-05-27 DIAGNOSIS — M79605 Pain in left leg: Secondary | ICD-10-CM

## 2017-05-27 DIAGNOSIS — M79604 Pain in right leg: Secondary | ICD-10-CM

## 2017-05-27 NOTE — Progress Notes (Deleted)
.rwq    Subjective:   Rachael [name] is a 81 y.o. female who is here for had concerns including Advice Only (swelling B legs ).    Past Medical History:   Diagnosis Date   . Anxiety    . Arthritis    . 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 Chronic cholecystitis with calculus 10/31/2015   . Chronic obstructive pulmonary disease    . Gastroesophageal reflux disease    . Hiatal hernia    . Hyperlipidemia    . Hypertension    . Hypothyroidism    . Incontinent of urine 2014    -WEARS DIAPERS-WILL BRING HER OWN   . Low back pain    . Lymphedema of right lower extremity    . PMR (polymyalgia rheumatica) 01/07/2017   . Polymyalgia rheumatica 09/2014   . Sleep apnea     USES CPAP NIGHTLY   . Type 2 diabetes mellitus, controlled    . Type II or unspecified type diabetes mellitus without mention of complication, not stated as uncontrolled    . Venous insufficiency of right leg 2016    also has lymphedema-KEEPS HER LEGS WRAPPED EXCEPT FOR WHEN SHE BATHES     Past Surgical History:   Procedure Laterality Date   . BACK SURGERY  2014   . CARPAL TUNNEL RELEASE Bilateral 1978   . HYSTERECTOMY  1985   . ROBOTIC, CHOLECYSTECTOMY N/A 10/31/2015    Procedure: ROBOT ASSISTED, LAPAROSCOPIC, CHOLECYSTECTOMY, UMBILICAL HERNIA REPAIR;  Surgeon: Alen Blew, MD;  Location: [facility];  Service: General;  Laterality: N/A;     No family history on file.  Social History     Social History   . Marital status: Widowed     Spouse name: N/A   . Number of children: N/A   . Years of education: N/A     Occupational History   . Not on file.     Social History Main Topics   . Smoking status: Former Smoker     Packs/day: 1.00     Years: 25.00     Quit date: 11/16/1998   . Smokeless tobacco: Never Used   . Alcohol use 1.8 oz/week     3 Glasses of wine per week      Comment: social   . Drug use: No   . Sexual activity: Not on file     Other Topics Concern   . Not on file     Social History Narrative   . No narrative  on file     Tolectin [tolmetin]; Lyrica [pregabalin]; and Latex  Current Outpatient Prescriptions   Medication Sig Dispense Refill   . aspirin 81 MG chewable tablet Chew 1 tablet (81 mg total) by mouth daily. 30 tablet 0   . dilTIAZem (CARDIZEM CD) 300 MG 24 hr capsule Take 1 capsule (300 mg total) by mouth daily. 90 capsule 1   . DULoxetine (CYMBALTA) 60 MG capsule Take 60 mg by mouth 2 (two) times daily.     Marland Kitchen enoxaparin (LOVENOX) 40 MG/0.4ML Solution Inject 0.4 mLs (40 mg total) into the skin daily. 7 Syringe 0   . gabapentin (NEURONTIN) 300 MG capsule Take 900 mg by mouth 2 (two) times daily.         Marland Kitchen guaiFENesin (MUCINEX) 600 MG 12 hr tablet Take 1 tablet (600 mg total) by mouth every 12 (twelve) hours. (Patient taking differently: Take 600 mg  by mouth daily.    )     . hydrALAZINE (APRESOLINE) 25 MG tablet Take 1 tablet by mouth 2 (two) times daily.  0   . HYDROcodone-acetaminophen (NORCO) 5-325 MG per tablet Take 1 tablet by mouth every 4 (four) hours as needed for Pain. 45 tablet 0   . insulin glargine (LANTUS) 100 UNIT/ML injection Inject 14 Units into the skin every morning. 5 pen 0   . insulin lispro (HUMALOG) 100 UNIT/ML injection Inject 1-8 Units into the skin 3 (three) times daily before meals as needed (High blood glucose. See administration instructions.). 3 pen 3   . ipratropium (ATROVENT) 0.02 % nebulizer solution Take 500 mcg by nebulization 4 (four) times daily.      1   . lactobacillus/streptococcus (RISAQUAD) Cap Take 1 capsule by mouth daily.     Marland Kitchen levothyroxine (SYNTHROID, LEVOTHROID) 50 MCG tablet Take 50 mcg by mouth Once a day at 6:00am.     . losartan-hydrochlorothiazide (HYZAAR) 100-25 MG per tablet Take 1 tablet by mouth every morning.     . metFORMIN (GLUCOPHAGE-XR) 750 MG 24 hr tablet Take 750 mg by mouth nightly.     . metoprolol succinate XL (TOPROL-XL) 25 MG 24 hr tablet Take 50 mg by mouth daily.         . pantoprazole (PROTONIX) 40 MG tablet Take 40 mg by mouth daily.        .  Pitavastatin Calcium (LIVALO) 2 MG Tab Take 1 tablet by mouth nightly.     . polyethylene glycol (MIRALAX) packet Take 17 g by mouth daily as needed (Constipation).     . potassium chloride (K-TAB,KLOR-CON) 10 MEQ tablet Take 10 mEq by mouth daily.        . predniSONE (DELTASONE) 5 MG tablet Take 5 mg by mouth daily.     . SYMBICORT 160-4.5 MCG/ACT inhaler Inhale 2 puffs into the lungs 2 (two) times daily.        . traZODone (DESYREL) 100 MG tablet Take 50 mg by mouth nightly.           No current facility-administered medications for this visit.        Review of Systems  Objective:     Vitals:    05/27/17 1037   BP: 118/67   Pulse: 88     Physical Exam  Assessment:   No diagnosis found.    Previous RAD Imaging:  Vascular Ultrasound Scan    Result Date: 05/27/2017  Ordered by an unspecified provider.    Patient Active Problem List    Diagnosis Date Noted   . Generalized weakness 02/04/2017   . Hypoxia 01/07/2017   . Hyponatremia 01/07/2017   . Leukocytosis 01/07/2017   . Anxiety 01/07/2017   . GERD (gastroesophageal reflux disease) 01/07/2017   . HLD (hyperlipidemia) 01/07/2017   . HTN (hypertension) 01/07/2017   . Hypothyroid 01/07/2017   . PMR (polymyalgia rheumatica) 01/07/2017   . OSA (obstructive sleep apnea) 01/07/2017   . Diabetes mellitus, type II 01/07/2017   . Sepsis, due to unspecified organism    . Acute bronchitis due to Rhinovirus 03/29/2016   . COPD exacerbation 03/25/2016   . Acute respiratory failure with hypoxia 03/25/2016   . Lactic acidosis 03/25/2016       ***    Plan:   No orders of the defined types were placed in this encounter.        ***    No Follow-up on file.

## 2017-05-27 NOTE — Progress Notes (Signed)
NEW PATIENT OFFICE VISIT  Vascular and Endovascular Surgery      Date Time: @IPTODAYDATE @ 10:49 AM  Consulting Physician: Smitty Cords, NP-C   Consulting Service: Internal Medicine    REASON FOR OFFICE VISIT:  Bilateral leg swelling    HISTORY OF PRESENT ILLNESS:  Rachael [name] is a 81 y.o. female presents for bilateral leg swelling. She has had chronic right leg swelling for over 10-15 years. Her left leg is swelling has developed in the last several years. No history of DVT or PE. No MI. + DM on insulin. + COPD. No foot tissue loss or ulceration. No claudication symptoms. + skin color changes in the ankles.    Past Medical History:   Diagnosis Date   . Anxiety    . Arthritis    . 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 Chronic cholecystitis with calculus 10/31/2015   . Chronic obstructive pulmonary disease    . Gastroesophageal reflux disease    . Hiatal hernia    . Hyperlipidemia    . Hypertension    . Hypothyroidism    . Incontinent of urine 2014    -WEARS DIAPERS-WILL BRING HER OWN   . Low back pain    . Lymphedema of right lower extremity    . PMR (polymyalgia rheumatica) 01/07/2017   . Polymyalgia rheumatica 09/2014   . Sleep apnea     USES CPAP NIGHTLY   . Type 2 diabetes mellitus, controlled    . Type II or unspecified type diabetes mellitus without mention of complication, not stated as uncontrolled    . Venous insufficiency of right leg 2016    also has lymphedema-KEEPS HER LEGS WRAPPED EXCEPT FOR WHEN SHE BATHES       Past Surgical History:   Procedure Laterality Date   . BACK SURGERY  2014   . CARPAL TUNNEL RELEASE Bilateral 1978   . HYSTERECTOMY  1985   . ROBOTIC, CHOLECYSTECTOMY N/A 10/31/2015    Procedure: ROBOT ASSISTED, LAPAROSCOPIC, CHOLECYSTECTOMY, UMBILICAL HERNIA REPAIR;  Surgeon: Alen Blew, MD;  Location: [facility];  Service: General;  Laterality: N/A;       No family history on file.    @IPSOCHX @    Allergies   Allergen Reactions   . Tolectin  [Tolmetin] Swelling   . Lyrica [Pregabalin]    . Latex Itching       @IPPTAMEDS @    REVIEW OF SYSTEMS:  A comprehensive twelve point review of systems was: Negative in the neurologic, respiratory, cardiac, vascular, gastrointestinal, genitourinary, musculoskeletal, immunologic,psychiatric or endocrinologic systems except as mentioned above.       PHYSICAL EXAMINATION:    Vitals:    05/27/17 1037   BP: 118/67   Pulse: 88       General: Patient appears their stated age, well-nourished. Alert and in no apparent distress. Speaks in full sentences    Head: Normal cranium and atraumatic.    Eyes: EOMI Bilaterally    Nose: No septal defects. No nasal discharge    Ears: Normal externals ears. Normal gross hearing    Throat: Oropharynx: Normal      Tongue FROM: Normal    Neck: Supple: Yes   Trachea Midline: Yes   JVD: No    CVS:  RRR, + Murmur    PUL: B/L CTA    ABD: Obese but soft    Skin: Bilateral lower legs with stasis dermatitis and dermatoliposclerosis up to the  knees.  + Bilateral lower leg edema with right worse than left.    Neuro: CN II-XII intact, and sensory intact    Psych: AAOx3, appropriate mood and affect.      Vascular Exam:  Palpable bilateral DP and PT pulses    Motor Strength:  Normal bilateral leg motor function.  Walks with walker.        LABORATORY AND IMAGING STUDIES:    Results     ** No results found for the last 24 hours. **          Radiology:  Vascular Ultrasound Scan    Result Date: 05/27/2017  Ordered by an unspecified provider.          Patient Active Problem List   Diagnosis   . COPD exacerbation   . Acute respiratory failure with hypoxia   . Lactic acidosis   . Acute bronchitis due to Rhinovirus   . Sepsis, due to unspecified organism   . Hypoxia   . Hyponatremia   . Leukocytosis   . Anxiety   . GERD (gastroesophageal reflux disease)   . HLD (hyperlipidemia)   . HTN (hypertension)   . Hypothyroid   . PMR (polymyalgia rheumatica)   . OSA (obstructive sleep apnea)   . Diabetes mellitus, type  II   . Generalized weakness       ASSESSMENT:  81 y.o. female with bilateral leg pain without evidence of DVT. Patient is suspected of venous insufficiency. I will obtain testing. Additionally, I will evaluate her for arterial insufficiency given her history of IDDM.    PLAN:  1. Bilateral leg venous insufficiency duplex and bilateral leg ABI/PVR and arterial duplex.    2. Follow-up office visit after above testing.    3. Compression stockings 20-60mmHg.          Dione Plover, MD, FACS  Vascular and Endovascular Surgery  [facility]  [facility]  [phone]    Subjective:   Rachael [name] is a 81 y.o. female who is here for had concerns including Advice Only (swelling B legs ).    Past Medical History:   Diagnosis Date   . Anxiety    . Arthritis    . 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 Chronic cholecystitis with calculus 10/31/2015   . Chronic obstructive pulmonary disease    . Gastroesophageal reflux disease    . Hiatal hernia    . Hyperlipidemia    . Hypertension    . Hypothyroidism    . Incontinent of urine 2014    -WEARS DIAPERS-WILL BRING HER OWN   . Low back pain    . Lymphedema of right lower extremity    . PMR (polymyalgia rheumatica) 01/07/2017   . Polymyalgia rheumatica 09/2014   . Sleep apnea     USES CPAP NIGHTLY   . Type 2 diabetes mellitus, controlled    . Type II or unspecified type diabetes mellitus without mention of complication, not stated as uncontrolled    . Venous insufficiency of right leg 2016    also has lymphedema-KEEPS HER LEGS WRAPPED EXCEPT FOR WHEN SHE BATHES     Past Surgical History:   Procedure Laterality Date   . BACK SURGERY  2014   . CARPAL TUNNEL RELEASE Bilateral 1978   . HYSTERECTOMY  1985   . ROBOTIC, CHOLECYSTECTOMY N/A 10/31/2015    Procedure: ROBOT ASSISTED, LAPAROSCOPIC, CHOLECYSTECTOMY, UMBILICAL HERNIA REPAIR;  Surgeon: Alen Blew, MD;  Location: [facility];  Service: General;   Laterality: N/A;     No family history on file.  Social History     Social History   . Marital status: Widowed     Spouse name: N/A   . Number of children: N/A   . Years of education: N/A     Occupational History   . Not on file.     Social History Main Topics   . Smoking status: Former Smoker     Packs/day: 1.00     Years: 25.00     Quit date: 11/16/1998   . Smokeless tobacco: Never Used   . Alcohol use 1.8 oz/week     3 Glasses of wine per week      Comment: social   . Drug use: No   . Sexual activity: Not on file     Other Topics Concern   . Not on file     Social History Narrative   . No narrative on file     Tolectin [tolmetin]; Lyrica [pregabalin]; and Latex  Current Outpatient Prescriptions   Medication Sig Dispense Refill   . aspirin 81 MG chewable tablet Chew 1 tablet (81 mg total) by mouth daily. 30 tablet 0   . dilTIAZem (CARDIZEM CD) 300 MG 24 hr capsule Take 1 capsule (300 mg total) by mouth daily. 90 capsule 1   . DULoxetine (CYMBALTA) 60 MG capsule Take 60 mg by mouth 2 (two) times daily.     Marland Kitchen enoxaparin (LOVENOX) 40 MG/0.4ML Solution Inject 0.4 mLs (40 mg total) into the skin daily. 7 Syringe 0   . gabapentin (NEURONTIN) 300 MG capsule Take 900 mg by mouth 2 (two) times daily.         Marland Kitchen guaiFENesin (MUCINEX) 600 MG 12 hr tablet Take 1 tablet (600 mg total) by mouth every 12 (twelve) hours. (Patient taking differently: Take 600 mg by mouth daily.    )     . hydrALAZINE (APRESOLINE) 25 MG tablet Take 1 tablet by mouth 2 (two) times daily.  0   . HYDROcodone-acetaminophen (NORCO) 5-325 MG per tablet Take 1 tablet by mouth every 4 (four) hours as needed for Pain. 45 tablet 0   . insulin glargine (LANTUS) 100 UNIT/ML injection Inject 14 Units into the skin every morning. 5 pen 0   . insulin lispro (HUMALOG) 100 UNIT/ML injection Inject 1-8 Units into the skin 3 (three) times daily before meals as needed (High blood glucose. See administration instructions.). 3 pen 3   . ipratropium (ATROVENT) 0.02 %  nebulizer solution Take 500 mcg by nebulization 4 (four) times daily.      1   . lactobacillus/streptococcus (RISAQUAD) Cap Take 1 capsule by mouth daily.     Marland Kitchen levothyroxine (SYNTHROID, LEVOTHROID) 50 MCG tablet Take 50 mcg by mouth Once a day at 6:00am.     . losartan-hydrochlorothiazide (HYZAAR) 100-25 MG per tablet Take 1 tablet by mouth every morning.     . metFORMIN (GLUCOPHAGE-XR) 750 MG 24 hr tablet Take 750 mg by mouth nightly.     . metoprolol succinate XL (TOPROL-XL) 25 MG 24 hr tablet Take 50 mg by mouth daily.         . pantoprazole (PROTONIX) 40 MG tablet Take 40 mg by mouth daily.        . Pitavastatin Calcium (LIVALO) 2 MG Tab Take 1 tablet by mouth nightly.     . polyethylene glycol (  MIRALAX) packet Take 17 g by mouth daily as needed (Constipation).     . potassium chloride (K-TAB,KLOR-CON) 10 MEQ tablet Take 10 mEq by mouth daily.        . predniSONE (DELTASONE) 5 MG tablet Take 5 mg by mouth daily.     . SYMBICORT 160-4.5 MCG/ACT inhaler Inhale 2 puffs into the lungs 2 (two) times daily.        . traZODone (DESYREL) 100 MG tablet Take 50 mg by mouth nightly.           No current facility-administered medications for this visit.        Review of Systems  Objective:     Vitals:    05/27/17 1037   BP: 118/67   Pulse: 88     Physical Exam  Assessment:   No diagnosis found.    Previous RAD Imaging:  Vascular Ultrasound Scan    Result Date: 05/27/2017  Ordered by an unspecified provider.    Patient Active Problem List    Diagnosis Date Noted   . Generalized weakness 02/04/2017   . Hypoxia 01/07/2017   . Hyponatremia 01/07/2017   . Leukocytosis 01/07/2017   . Anxiety 01/07/2017   . GERD (gastroesophageal reflux disease) 01/07/2017   . HLD (hyperlipidemia) 01/07/2017   . HTN (hypertension) 01/07/2017   . Hypothyroid 01/07/2017   . PMR (polymyalgia rheumatica) 01/07/2017   . OSA (obstructive sleep apnea) 01/07/2017   . Diabetes mellitus, type II 01/07/2017   . Sepsis, due to unspecified organism    . Acute  bronchitis due to Rhinovirus 03/29/2016   . COPD exacerbation 03/25/2016   . Acute respiratory failure with hypoxia 03/25/2016   . Lactic acidosis 03/25/2016

## 2017-06-14 ENCOUNTER — Ambulatory Visit
Admission: RE | Admit: 2017-06-14 | Discharge: 2017-06-14 | Disposition: A | Discharge status: Home / Self Care | Payer: No Typology Code available for payment source | Source: Ambulatory Visit | Attending: Surgery | Admitting: Surgery

## 2017-06-14 DIAGNOSIS — M79605 Pain in left leg: Secondary | ICD-10-CM

## 2017-06-14 DIAGNOSIS — M7989 Other specified soft tissue disorders: Secondary | ICD-10-CM

## 2017-06-14 DIAGNOSIS — I878 Other specified disorders of veins: Secondary | ICD-10-CM | POA: Insufficient documentation

## 2017-06-14 DIAGNOSIS — M79604 Pain in right leg: Secondary | ICD-10-CM

## 2017-06-15 ENCOUNTER — Ambulatory Visit (INDEPENDENT_AMBULATORY_CARE_PROVIDER_SITE_OTHER): Payer: No Typology Code available for payment source | Admitting: Surgery

## 2017-06-15 ENCOUNTER — Encounter (INDEPENDENT_AMBULATORY_CARE_PROVIDER_SITE_OTHER): Payer: Self-pay | Admitting: Surgery

## 2017-06-15 VITALS — BP 131/71 | HR 97 | Wt 176.0 lb

## 2017-06-15 DIAGNOSIS — I83893 Varicose veins of bilateral lower extremities with other complications: Secondary | ICD-10-CM

## 2017-06-16 NOTE — Progress Notes (Signed)
ROUTINE OFFICE VISIT  Vascular and Endovascular Surgery      Date Time: @IPTODAYDATE @ 3:31 PM  Consulting Physician: Dione Plover MD  Consulting Service: Vascular and Endovascular Surgery    REASON FOR OFFICE VISIT:  Bilateral leg swelling    HISTORY OF PRESENT ILLNESS:  Rachael [name] is a 81 y.o. female presents for bilateral leg swelling. She has had chronic right leg swelling for over 10-15 years. Her left leg is swelling has developed in the last several years. No history of DVT or PE. No MI. + DM on insulin. + COPD. No foot tissue loss or ulceration. No claudication symptoms. + skin color changes in the ankles.    She has had not change in her symptoms, still with mild bilateral swelling. She had arterial duplex and venous duplex imaging performed. She is here to review the results of her studies.    Past Medical History:   Diagnosis Date   . Anxiety    . Arthritis    . 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 Chronic cholecystitis with calculus 10/31/2015   . Chronic obstructive pulmonary disease    . Gastroesophageal reflux disease    . Hiatal hernia    . Hyperlipidemia    . Hypertension    . Hypothyroidism    . Incontinent of urine 2014    -WEARS DIAPERS-WILL BRING HER OWN   . Low back pain    . Lymphedema of right lower extremity    . PMR (polymyalgia rheumatica) 01/07/2017   . Polymyalgia rheumatica 09/2014   . Sleep apnea     USES CPAP NIGHTLY   . Type 2 diabetes mellitus, controlled    . Type II or unspecified type diabetes mellitus without mention of complication, not stated as uncontrolled    . Venous insufficiency of right leg 2016    also has lymphedema-KEEPS HER LEGS WRAPPED EXCEPT FOR WHEN SHE BATHES       Past Surgical History:   Procedure Laterality Date   . BACK SURGERY  2014   . CARPAL TUNNEL RELEASE Bilateral 1978   . HYSTERECTOMY  1985   . ROBOTIC, CHOLECYSTECTOMY N/A 10/31/2015    Procedure: ROBOT ASSISTED, LAPAROSCOPIC, CHOLECYSTECTOMY, UMBILICAL HERNIA REPAIR;   Surgeon: Alen Blew, MD;  Location: [facility];  Service: General;  Laterality: N/A;       No family history on file.    @IPSOCHX @    Allergies   Allergen Reactions   . Tolectin [Tolmetin] Swelling   . Lyrica [Pregabalin]    . Latex Itching       @IPPTAMEDS @    REVIEW OF SYSTEMS:  A comprehensive twelve point review of systems was: Negative in the neurologic, respiratory, cardiac, vascular, gastrointestinal, genitourinary, musculoskeletal, immunologic,psychiatric or endocrinologic systems except as mentioned above.       PHYSICAL EXAMINATION:    Vitals:    06/15/17 1056   BP: 131/71   Pulse: 97       General: Patient appears their stated age, well-nourished. Alert and in no apparent distress. Speaks in full sentences    Head: Normal cranium and atraumatic.    Eyes: EOMI Bilaterally    Nose: No septal defects. No nasal discharge    Ears: Normal externals ears. Normal gross hearing    Throat: Oropharynx: Normal      Tongue FROM: Normal    Neck: Supple: Yes   Trachea Midline: Yes   JVD: No  CVS:  RRR    PUL: No labored breathing    ABD: Non-distended    Skin: Warm and dry    Neuro: CN II-XII intact, and sensory intact    Psych: AAOx3, appropriate mood and affect.      Vascular Exam:  Mild bilateral ankle swelling    Motor Strength:  Normal gait  No lower extremity motor deficits      LABORATORY AND IMAGING STUDIES:    Results     ** No results found for the last 24 hours. **          Radiology:  US Arterial/ Graft Duplex Dopp Low Extrem Bil Comp    Result Date: 06/14/2017  CLINICAL HISTORY:  Bilateral lower extremity skin discoloration. Noninvasive arterial studies of the lower extremities were performed using segmental limb pressures, volume plethysmography, and continuous wave Doppler analysis.   Doppler segmental pressures from the proximal thighs to the ankles are normal without any abnormal pressure gradients.  Pulse volume recordings are normal from the proximal thighs to the metatarsal  level.   Doppler waveform configurations are satisfactory at the common femorals, popliteals, posterior tibials, and dorsalis pedis arteries bilaterally.  The resting ankle-brachial indices are 1.33 right and 1.42 left. High resolution gray scale imaging, color Doppler and spectral waveform analysis were used to evaluate the lower extremities bilaterally Normal waveforms and velocities are noted.      1. Normal resting arterial screening study of the lower extremities. 2. Normal duplex examination of the lower extremities bilaterally. Lemar Livings, MD 06/14/2017 5:36 PM    US Venous Insufficiency Duplex Doppler Leg Bilateral    Result Date: 06/14/2017  CLINICAL HISTORY:  Leg pain. The right and left lower extremity venous systems were evaluated using high resolution gray scale imaging, color Doppler, and spectral waveform analysis.  No deep or superficial thrombosis is identified.  The deep venous systems were evaluated from the posterior tibial veins through the common femoral veins and are widely patent with normal phasic flow, compressibility and augmentation.  Evaluation for reflux was performed in the standing position.  The saphenofemoral junction and great saphenous veins are normal with no evidence of reflux. The left great saphenous vein at the calf and ankle demonstrates severe reflux with a greatest diameter of 2 mm. The small saphenous veins are normal with no evidence of reflux. There are severely incompetent perforators noted at the proximal and mid calf level measuring 2 and 3 mm respectively.     1. Severe reflux of the left great saphenous vein at the calf and ankle as described above. 2. Severe reflux of two perforators in the right proximal and mid calf level. 3. These are likely not clinically significant findings. Lemar Livings, MD 06/14/2017 5:34 PM    [place] Noninvas Low Extrem Art Dopp/press/wavefrms (pvr) Comp 3-4 Lvls    Result Date: 06/14/2017  CLINICAL HISTORY:  Bilateral lower extremity skin  discoloration. Noninvasive arterial studies of the lower extremities were performed using segmental limb pressures, volume plethysmography, and continuous wave Doppler analysis.   Doppler segmental pressures from the proximal thighs to the ankles are normal without any abnormal pressure gradients.  Pulse volume recordings are normal from the proximal thighs to the metatarsal level.   Doppler waveform configurations are satisfactory at the common femorals, popliteals, posterior tibials, and dorsalis pedis arteries bilaterally.  The resting ankle-brachial indices are 1.33 right and 1.42 left. High resolution gray scale imaging, color Doppler and spectral waveform analysis were used to  evaluate the lower extremities bilaterally Normal waveforms and velocities are noted.      1. Normal resting arterial screening study of the lower extremities. 2. Normal duplex examination of the lower extremities bilaterally. Lemar Livings, MD 06/14/2017 5:36 PM    Vascular Ultrasound Scan    Result Date: 05/27/2017  Ordered by an unspecified provider.          Patient Active Problem List   Diagnosis   . COPD exacerbation   . Acute respiratory failure with hypoxia   . Lactic acidosis   . Acute bronchitis due to Rhinovirus   . Sepsis, due to unspecified organism   . Hypoxia   . Hyponatremia   . Leukocytosis   . Anxiety   . GERD (gastroesophageal reflux disease)   . HLD (hyperlipidemia)   . HTN (hypertension)   . Hypothyroid   . PMR (polymyalgia rheumatica)   . OSA (obstructive sleep apnea)   . Diabetes mellitus, type II   . Generalized weakness       ASSESSMENT:  81 y.o. female with bilateral ankle swelling with lymphedema. She has no evidence of arterial insufficiency. She has left GSV reflux in the calf and ankle and right calf perforator reflux. I have recommend continued leg elevation and compression for treatment of her ankle swelling. No surgical intervention is required.    PLAN:  1. Follow-up prn    2. Continue with compression and  elevation.      Dione Plover, MD, FACS  Vascular and Endovascular Surgery  [facility]  [facility]  [phone]      Subjective:   Rachael [name] is a 81 y.o. female who is here for had concerns including Follow-up (w/ testing ).    Past Medical History:   Diagnosis Date   . Anxiety    . Arthritis    . 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 Chronic cholecystitis with calculus 10/31/2015   . Chronic obstructive pulmonary disease    . Gastroesophageal reflux disease    . Hiatal hernia    . Hyperlipidemia    . Hypertension    . Hypothyroidism    . Incontinent of urine 2014    -WEARS DIAPERS-WILL BRING HER OWN   . Low back pain    . Lymphedema of right lower extremity    . PMR (polymyalgia rheumatica) 01/07/2017   . Polymyalgia rheumatica 09/2014   . Sleep apnea     USES CPAP NIGHTLY   . Type 2 diabetes mellitus, controlled    . Type II or unspecified type diabetes mellitus without mention of complication, not stated as uncontrolled    . Venous insufficiency of right leg 2016    also has lymphedema-KEEPS HER LEGS WRAPPED EXCEPT FOR WHEN SHE BATHES     Past Surgical History:   Procedure Laterality Date   . BACK SURGERY  2014   . CARPAL TUNNEL RELEASE Bilateral 1978   . HYSTERECTOMY  1985   . ROBOTIC, CHOLECYSTECTOMY N/A 10/31/2015    Procedure: ROBOT ASSISTED, LAPAROSCOPIC, CHOLECYSTECTOMY, UMBILICAL HERNIA REPAIR;  Surgeon: Alen Blew, MD;  Location: [facility];  Service: General;  Laterality: N/A;     No family history on file.  Social History     Social History   . Marital status: Widowed     Spouse name: N/A   . Number of children: N/A   . Years of education:  N/A     Occupational History   . Not on file.     Social History Main Topics   . Smoking status: Former Smoker     Packs/day: 1.00     Years: 25.00     Quit date: 11/16/1998   . Smokeless tobacco: Never Used   . Alcohol use 1.8 oz/week     3 Glasses of wine per week      Comment:  social   . Drug use: No   . Sexual activity: Not on file     Other Topics Concern   . Not on file     Social History Narrative   . No narrative on file     Tolectin [tolmetin]; Lyrica [pregabalin]; and Latex  Current Outpatient Prescriptions   Medication Sig Dispense Refill   . aspirin 81 MG chewable tablet Chew 1 tablet (81 mg total) by mouth daily. 30 tablet 0   . dilTIAZem (CARDIZEM CD) 300 MG 24 hr capsule Take 1 capsule (300 mg total) by mouth daily. 90 capsule 1   . DULoxetine (CYMBALTA) 60 MG capsule Take 60 mg by mouth 2 (two) times daily.     Marland Kitchen enoxaparin (LOVENOX) 40 MG/0.4ML Solution Inject 0.4 mLs (40 mg total) into the skin daily. 7 Syringe 0   . furosemide (LASIX) 40 MG tablet Take 40 mg by mouth 2 (two) times daily.     Marland Kitchen gabapentin (NEURONTIN) 300 MG capsule Take 900 mg by mouth 2 (two) times daily.         Marland Kitchen guaiFENesin (MUCINEX) 600 MG 12 hr tablet Take 1 tablet (600 mg total) by mouth every 12 (twelve) hours. (Patient taking differently: Take 600 mg by mouth daily.    )     . hydrALAZINE (APRESOLINE) 25 MG tablet Take 1 tablet by mouth 2 (two) times daily.  0   . HYDROcodone-acetaminophen (NORCO) 5-325 MG per tablet Take 1 tablet by mouth every 4 (four) hours as needed for Pain. 45 tablet 0   . insulin glargine (LANTUS) 100 UNIT/ML injection Inject 14 Units into the skin every morning. 5 pen 0   . insulin lispro (HUMALOG) 100 UNIT/ML injection Inject 1-8 Units into the skin 3 (three) times daily before meals as needed (High blood glucose. See administration instructions.). 3 pen 3   . ipratropium (ATROVENT) 0.02 % nebulizer solution Take 500 mcg by nebulization 4 (four) times daily.      1   . lactobacillus/streptococcus (RISAQUAD) Cap Take 1 capsule by mouth daily.     Marland Kitchen levothyroxine (SYNTHROID, LEVOTHROID) 50 MCG tablet Take 50 mcg by mouth Once a day at 6:00am.     . losartan-hydrochlorothiazide (HYZAAR) 100-25 MG per tablet Take 1 tablet by mouth every morning.     . metFORMIN (GLUCOPHAGE-XR)  750 MG 24 hr tablet Take 750 mg by mouth nightly.     . metoprolol succinate XL (TOPROL-XL) 25 MG 24 hr tablet Take 50 mg by mouth daily.         . pantoprazole (PROTONIX) 40 MG tablet Take 40 mg by mouth daily.        . Pitavastatin Calcium (LIVALO) 2 MG Tab Take 1 tablet by mouth nightly.     . polyethylene glycol (MIRALAX) packet Take 17 g by mouth daily as needed (Constipation).     . potassium chloride (K-TAB,KLOR-CON) 10 MEQ tablet Take 10 mEq by mouth daily.        . predniSONE (DELTASONE) 5 MG tablet  Take 5 mg by mouth daily.     . SYMBICORT 160-4.5 MCG/ACT inhaler Inhale 2 puffs into the lungs 2 (two) times daily.        . traZODone (DESYREL) 100 MG tablet Take 50 mg by mouth nightly.           No current facility-administered medications for this visit.        Review of Systems  Objective:     Vitals:    06/15/17 1056   BP: 131/71   Pulse: 97     Physical Exam  Assessment:   No diagnosis found.    Previous RAD Imaging:  US Arterial/ Graft Duplex Dopp Low Extrem Bil Comp    Result Date: 06/14/2017  CLINICAL HISTORY:  Bilateral lower extremity skin discoloration. Noninvasive arterial studies of the lower extremities were performed using segmental limb pressures, volume plethysmography, and continuous wave Doppler analysis.   Doppler segmental pressures from the proximal thighs to the ankles are normal without any abnormal pressure gradients.  Pulse volume recordings are normal from the proximal thighs to the metatarsal level.   Doppler waveform configurations are satisfactory at the common femorals, popliteals, posterior tibials, and dorsalis pedis arteries bilaterally.  The resting ankle-brachial indices are 1.33 right and 1.42 left. High resolution gray scale imaging, color Doppler and spectral waveform analysis were used to evaluate the lower extremities bilaterally Normal waveforms and velocities are noted.      1. Normal resting arterial screening study of the lower extremities. 2. Normal duplex  examination of the lower extremities bilaterally. Lemar Livings, MD 06/14/2017 5:36 PM    US Venous Insufficiency Duplex Doppler Leg Bilateral    Result Date: 06/14/2017  CLINICAL HISTORY:  Leg pain. The right and left lower extremity venous systems were evaluated using high resolution gray scale imaging, color Doppler, and spectral waveform analysis.  No deep or superficial thrombosis is identified.  The deep venous systems were evaluated from the posterior tibial veins through the common femoral veins and are widely patent with normal phasic flow, compressibility and augmentation.  Evaluation for reflux was performed in the standing position.  The saphenofemoral junction and great saphenous veins are normal with no evidence of reflux. The left great saphenous vein at the calf and ankle demonstrates severe reflux with a greatest diameter of 2 mm. The small saphenous veins are normal with no evidence of reflux. There are severely incompetent perforators noted at the proximal and mid calf level measuring 2 and 3 mm respectively.     1. Severe reflux of the left great saphenous vein at the calf and ankle as described above. 2. Severe reflux of two perforators in the right proximal and mid calf level. 3. These are likely not clinically significant findings. Lemar Livings, MD 06/14/2017 5:34 PM    [place] Noninvas Low Extrem Art Dopp/press/wavefrms (pvr) Comp 3-4 Lvls    Result Date: 06/14/2017  CLINICAL HISTORY:  Bilateral lower extremity skin discoloration. Noninvasive arterial studies of the lower extremities were performed using segmental limb pressures, volume plethysmography, and continuous wave Doppler analysis.   Doppler segmental pressures from the proximal thighs to the ankles are normal without any abnormal pressure gradients.  Pulse volume recordings are normal from the proximal thighs to the metatarsal level.   Doppler waveform configurations are satisfactory at the common femorals, popliteals, posterior tibials,  and dorsalis pedis arteries bilaterally.  The resting ankle-brachial indices are 1.33 right and 1.42 left. High resolution gray scale imaging, color Doppler and  spectral waveform analysis were used to evaluate the lower extremities bilaterally Normal waveforms and velocities are noted.      1. Normal resting arterial screening study of the lower extremities. 2. Normal duplex examination of the lower extremities bilaterally. Lemar Livings, MD 06/14/2017 5:36 PM    Vascular Ultrasound Scan    Result Date: 05/27/2017  Ordered by an unspecified provider.    Patient Active Problem List    Diagnosis Date Noted   . Generalized weakness 02/04/2017   . Hypoxia 01/07/2017   . Hyponatremia 01/07/2017   . Leukocytosis 01/07/2017   . Anxiety 01/07/2017   . GERD (gastroesophageal reflux disease) 01/07/2017   . HLD (hyperlipidemia) 01/07/2017   . HTN (hypertension) 01/07/2017   . Hypothyroid 01/07/2017   . PMR (polymyalgia rheumatica) 01/07/2017   . OSA (obstructive sleep apnea) 01/07/2017   . Diabetes mellitus, type II 01/07/2017   . Sepsis, due to unspecified organism    . Acute bronchitis due to Rhinovirus 03/29/2016   . COPD exacerbation 03/25/2016   . Acute respiratory failure with hypoxia 03/25/2016   . Lactic acidosis 03/25/2016

## 2017-06-17 ENCOUNTER — Ambulatory Visit (INDEPENDENT_AMBULATORY_CARE_PROVIDER_SITE_OTHER): Payer: No Typology Code available for payment source | Admitting: Surgery

## 2017-06-25 ENCOUNTER — Other Ambulatory Visit: Payer: Self-pay | Admitting: Family Medicine

## 2017-07-20 ENCOUNTER — Emergency Department: Payer: No Typology Code available for payment source

## 2017-07-20 ENCOUNTER — Observation Stay
Admission: EM | Admit: 2017-07-20 | Discharge: 2017-07-22 | Disposition: A | Discharge status: Home / Self Care | Payer: No Typology Code available for payment source | Attending: Internal Medicine | Admitting: Internal Medicine

## 2017-07-20 ENCOUNTER — Observation Stay: Payer: No Typology Code available for payment source

## 2017-07-20 DIAGNOSIS — E785 Hyperlipidemia, unspecified: Secondary | ICD-10-CM | POA: Insufficient documentation

## 2017-07-20 DIAGNOSIS — Z6839 Body mass index (BMI) 39.0-39.9, adult: Secondary | ICD-10-CM | POA: Insufficient documentation

## 2017-07-20 DIAGNOSIS — R0902 Hypoxemia: Secondary | ICD-10-CM

## 2017-07-20 DIAGNOSIS — K219 Gastro-esophageal reflux disease without esophagitis: Secondary | ICD-10-CM | POA: Insufficient documentation

## 2017-07-20 DIAGNOSIS — E119 Type 2 diabetes mellitus without complications: Secondary | ICD-10-CM | POA: Insufficient documentation

## 2017-07-20 DIAGNOSIS — E875 Hyperkalemia: Secondary | ICD-10-CM | POA: Insufficient documentation

## 2017-07-20 DIAGNOSIS — Z794 Long term (current) use of insulin: Secondary | ICD-10-CM | POA: Insufficient documentation

## 2017-07-20 DIAGNOSIS — E669 Obesity, unspecified: Secondary | ICD-10-CM | POA: Insufficient documentation

## 2017-07-20 DIAGNOSIS — R140 Abdominal distension (gaseous): Secondary | ICD-10-CM | POA: Insufficient documentation

## 2017-07-20 DIAGNOSIS — G4730 Sleep apnea, unspecified: Secondary | ICD-10-CM | POA: Insufficient documentation

## 2017-07-20 DIAGNOSIS — J441 Chronic obstructive pulmonary disease with (acute) exacerbation: Principal | ICD-10-CM | POA: Insufficient documentation

## 2017-07-20 DIAGNOSIS — R0609 Other forms of dyspnea: Secondary | ICD-10-CM | POA: Diagnosis present

## 2017-07-20 DIAGNOSIS — I10 Essential (primary) hypertension: Secondary | ICD-10-CM | POA: Insufficient documentation

## 2017-07-20 DIAGNOSIS — I872 Venous insufficiency (chronic) (peripheral): Secondary | ICD-10-CM | POA: Insufficient documentation

## 2017-07-20 DIAGNOSIS — Z7982 Long term (current) use of aspirin: Secondary | ICD-10-CM | POA: Insufficient documentation

## 2017-07-20 DIAGNOSIS — I890 Lymphedema, not elsewhere classified: Secondary | ICD-10-CM | POA: Insufficient documentation

## 2017-07-20 DIAGNOSIS — K449 Diaphragmatic hernia without obstruction or gangrene: Secondary | ICD-10-CM | POA: Insufficient documentation

## 2017-07-20 DIAGNOSIS — E039 Hypothyroidism, unspecified: Secondary | ICD-10-CM | POA: Insufficient documentation

## 2017-07-20 DIAGNOSIS — K5900 Constipation, unspecified: Secondary | ICD-10-CM

## 2017-07-20 DIAGNOSIS — D649 Anemia, unspecified: Secondary | ICD-10-CM | POA: Insufficient documentation

## 2017-07-20 DIAGNOSIS — M353 Polymyalgia rheumatica: Secondary | ICD-10-CM | POA: Insufficient documentation

## 2017-07-20 LAB — COMPREHENSIVE METABOLIC PANEL
ALT: 16 U/L (ref 0–55)
AST (SGOT): 35 U/L — ABNORMAL HIGH (ref 5–34)
Albumin/Globulin Ratio: 0.9 (ref 0.9–2.2)
Albumin: 3.5 g/dL (ref 3.5–5.0)
Alkaline Phosphatase: 83 U/L (ref 37–106)
Anion Gap: 17 — ABNORMAL HIGH (ref 5.0–15.0)
BUN: 13.7 mg/dL (ref 7.0–19.0)
Bilirubin, Total: 0.3 mg/dL (ref 0.2–1.2)
CO2: 24 mEq/L (ref 22–29)
Calcium: 9.5 mg/dL (ref 7.9–10.2)
Chloride: 100 mEq/L (ref 100–111)
Creatinine: 0.8 mg/dL (ref 0.6–1.0)
Globulin: 4.1 g/dL — ABNORMAL HIGH (ref 2.0–3.6)
Glucose: 200 mg/dL — ABNORMAL HIGH (ref 70–100)
Potassium: 5.8 mEq/L — ABNORMAL HIGH (ref 3.5–5.1)
Protein, Total: 7.6 g/dL (ref 6.0–8.3)
Sodium: 141 mEq/L (ref 136–145)

## 2017-07-20 LAB — CBC AND DIFFERENTIAL
Absolute NRBC: 0 10*3/uL
Basophils Absolute Automated: 0.04 10*3/uL (ref 0.00–0.20)
Basophils Automated: 0.4 %
Eosinophils Absolute Automated: 0.11 10*3/uL (ref 0.00–0.70)
Eosinophils Automated: 1.1 %
Hematocrit: 36.2 % — ABNORMAL LOW (ref 37.0–47.0)
Hgb: 11.2 g/dL — ABNORMAL LOW (ref 12.0–16.0)
Immature Granulocytes Absolute: 0.13 10*3/uL — ABNORMAL HIGH
Immature Granulocytes: 1.3 %
Lymphocytes Absolute Automated: 1.26 10*3/uL (ref 0.50–4.40)
Lymphocytes Automated: 12.3 %
MCH: 25.3 pg — ABNORMAL LOW (ref 28.0–32.0)
MCHC: 30.9 g/dL — ABNORMAL LOW (ref 32.0–36.0)
MCV: 81.9 fL (ref 80.0–100.0)
MPV: 9.1 fL — ABNORMAL LOW (ref 9.4–12.3)
Monocytes Absolute Automated: 0.59 10*3/uL (ref 0.00–1.20)
Monocytes: 5.8 %
Neutrophils Absolute: 8.1 10*3/uL (ref 1.80–8.10)
Neutrophils: 79.1 %
Nucleated RBC: 0 /100 WBC (ref 0.0–1.0)
Platelets: 321 10*3/uL (ref 140–400)
RBC: 4.42 10*6/uL (ref 4.20–5.40)
RDW: 20 % — ABNORMAL HIGH (ref 12–15)
WBC: 10.23 10*3/uL (ref 3.50–10.80)

## 2017-07-20 LAB — TROPONIN I: Troponin I: <0.01 ng/mL (ref 0.00–0.09)

## 2017-07-20 LAB — GFR: EGFR: >60

## 2017-07-20 LAB — B-TYPE NATRIURETIC PEPTIDE: B-Natriuretic Peptide: 37.6 pg/mL (ref 0.0–100.0)

## 2017-07-20 LAB — IHS D-DIMER: D-Dimer: 0.73 ug/mL FEU — ABNORMAL HIGH (ref 0.00–0.70)

## 2017-07-20 MED ORDER — IODIXANOL 320 MG/ML IV SOLN
100.0000 mL | Freq: Once | INTRAVENOUS | Status: AC | PRN
Start: 2017-07-20 — End: 2017-07-20
  Administered 2017-07-20: 22:00:00 100 mL via INTRAVENOUS

## 2017-07-20 NOTE — ED Triage Notes (Signed)
Short of breath x 3 days. No cough. Hx COPD.

## 2017-07-20 NOTE — ED Notes (Signed)
Patient is two assist, has known isolation requirements.  Resident of [facility].

## 2017-07-20 NOTE — ED Provider Notes (Signed)
Physician/Midlevel provider first contact with patient: 07/20/17 1845         History     Chief Complaint   Patient presents with   . Shortness of Breath     HPI     Patient Name: Rachael [name], Rachael [name], 81 y.o., female      DR. Kaylor Maiers  is the primary attending for this patient and performed the HPI, PE, and medical decision making for this patient.    History obtained by: patient  CC/Onset/Duration/Quality/Location/Severity/Context/Associated Symptoms: Patient presenting with worsening shortness of breath over the last couple weeks, exertional in nature.  Has been noticing O2 saturations dropping.  Is not ox independent.  Patient denies any chest pain, syncope, cough, nausea, vomiting.  Aggravating factors are : Exertion. Alleviating factors are : Oxygen given in ER          *This note was generated by the Epic EMR system/Voice recognition system and may contain inherent errors or omissions not intended by the user. Grammatical errors, random word insertions, deletions, pronoun errors and incomplete sentences are occasional consequences of this technology due to software limitations. Not all errors are caught or corrected. If there are questions or concerns about the content of this note or information contained within the body of this dictation they should be addressed directly with the author for clarification          Past Medical History:   Diagnosis Date   . Anxiety    . Arthritis    . 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 Chronic cholecystitis with calculus 10/31/2015   . Chronic obstructive pulmonary disease    . Gastroesophageal reflux disease    . Hiatal hernia    . Hyperlipidemia    . Hypertension    . Hypothyroidism    . Incontinent of urine 2014    -WEARS DIAPERS-WILL BRING HER OWN   . Low back pain    . Lymphedema of right lower extremity    . PMR (polymyalgia rheumatica) 01/07/2017   . Polymyalgia rheumatica 09/2014   . Sleep apnea     USES CPAP NIGHTLY   . Type 2 diabetes  mellitus, controlled    . Type II or unspecified type diabetes mellitus without mention of complication, not stated as uncontrolled    . Venous insufficiency of right leg 2016    also has lymphedema-KEEPS HER LEGS WRAPPED EXCEPT FOR WHEN SHE BATHES       Past Surgical History:   Procedure Laterality Date   . BACK SURGERY  2014   . CARPAL TUNNEL RELEASE Bilateral 1978   . HYSTERECTOMY  1985   . ROBOTIC, CHOLECYSTECTOMY N/A 10/31/2015    Procedure: ROBOT ASSISTED, LAPAROSCOPIC, CHOLECYSTECTOMY, UMBILICAL HERNIA REPAIR;  Surgeon: Alen Blew, MD;  Location: [facility];  Service: General;  Laterality: N/A;       No family history on file.    Social  Social History   Substance Use Topics   . Smoking status: Former Smoker     Packs/day: 1.00     Years: 25.00     Quit date: 11/16/1998   . Smokeless tobacco: Never Used   . Alcohol use 1.8 oz/week     3 Glasses of wine per week      Comment: social       .     Allergies   Allergen Reactions   . Tolectin [Tolmetin] Swelling   . Lyrica [Pregabalin]    .  Latex Itching       Home Medications     Med List Status:  In Progress Set By: Charlton Haws, RN at 07/20/2017  8:01 PM                aspirin 81 MG chewable tablet     Chew 1 tablet (81 mg total) by mouth daily.     celecoxib (CELEBREX) 200 MG capsule     Take 200 mg by mouth 2 (two) times daily.     cetirizine (ZYRTEC) 10 MG tablet     Take 10 mg by mouth daily.     dilTIAZem (CARDIZEM CD) 300 MG 24 hr capsule     Take 1 capsule (300 mg total) by mouth daily.     DULoxetine (CYMBALTA) 60 MG capsule     Take 60 mg by mouth 2 (two) times daily.     furosemide (LASIX) 40 MG tablet     Take 40 mg by mouth 2 (two) times daily.     gabapentin (NEURONTIN) 300 MG capsule     Take 900 mg by mouth 2 (two) times daily.         guaiFENesin (MUCINEX) 600 MG 12 hr tablet     Take 1 tablet (600 mg total) by mouth every 12 (twelve) hours.     Patient taking differently:  Take 600 mg by mouth daily.         hydrALAZINE  (APRESOLINE) 25 MG tablet     Take 1 tablet by mouth 2 (two) times daily.     HYDROcodone-acetaminophen (NORCO) 5-325 MG per tablet     Take 1 tablet by mouth every 4 (four) hours as needed for Pain.     insulin glargine (LANTUS) 100 UNIT/ML injection     Inject 14 Units into the skin every morning.     insulin lispro (HUMALOG) 100 UNIT/ML injection     Inject 1-8 Units into the skin 3 (three) times daily before meals as needed (High blood glucose. See administration instructions.).     ipratropium (ATROVENT) 0.02 % nebulizer solution     Take 500 mcg by nebulization 4 (four) times daily.         lactobacillus acidophilus & bulgar (LACTINEX) chewable tablet     Chew 1 tablet by mouth 3 (three) times daily with meals.     lactobacillus/streptococcus (RISAQUAD) Cap     Take 1 capsule by mouth daily.     levothyroxine (SYNTHROID, LEVOTHROID) 50 MCG tablet     Take 50 mcg by mouth Once a day at 6:00am.     lidocaine (LIDODERM) 5 %     Place 1 patch onto the skin daily.Remove & Discard patch within 12 hours or as directed by MD     losartan-hydrochlorothiazide (HYZAAR) 100-25 MG per tablet     Take 1 tablet by mouth every morning.     loteprednol (LOTEMAX) 0.2 % Suspension     1 drop 4 (four) times daily.     metFORMIN (GLUCOPHAGE-XR) 750 MG 24 hr tablet     Take 750 mg by mouth nightly.     metoprolol succinate XL (TOPROL-XL) 25 MG 24 hr tablet     Take 100 mg by mouth daily.         pantoprazole (PROTONIX) 40 MG tablet     Take 40 mg by mouth daily.        Pitavastatin Calcium (LIVALO) 2 MG Tab     Take  by mouth nightly.     Polyethyl Glycol-Propyl Glycol (SYSTANE ULTRA OP)     Apply to eye.     polyethylene glycol (MIRALAX) packet     Take 17 g by mouth daily as needed (Constipation).     potassium chloride (K-TAB,KLOR-CON) 10 MEQ tablet     Take 10 mEq by mouth daily.        predniSONE (DELTASONE) 5 MG tablet     Take 7 mg by mouth daily.         raNITIdine (ZANTAC) 150 MG tablet     Take 150 mg by mouth 2 (two)  times daily.     senna (SENOKOT) 8.6 MG tablet     Take 2 tablets by mouth daily.     SYMBICORT 160-4.5 MCG/ACT inhaler     Inhale 2 puffs into the lungs 2 (two) times daily.        tiotropium (SPIRIVA) 18 MCG inhalation capsule     Place 18 mcg into inhaler and inhale daily.     traZODone (DESYREL) 100 MG tablet     Take 50 mg by mouth nightly.                                   Review of Systems   Constitutional: Negative for fever.   Respiratory: Positive for shortness of breath.    Cardiovascular: Negative for chest pain.   Gastrointestinal: Negative for abdominal pain, nausea and vomiting.   Musculoskeletal: Negative for neck pain.   Skin: Negative for pallor and rash.   Neurological: Negative for syncope and headaches.   All other systems reviewed and are negative.      Physical Exam    BP: 122/62, Heart Rate: 89, Temp: (!) 96.9 F (36.1 C), Resp Rate: 14, SpO2: 92 %, Weight: 83.9 kg    Physical Exam   Constitutional: She is oriented to person, place, and time. She appears distressed.   HENT:   Head: Normocephalic and atraumatic.   Mouth/Throat: Oropharynx is clear and moist.   Eyes: Conjunctivae and EOM are normal.   Neck: Normal range of motion. Neck supple.   Cardiovascular: Normal rate, regular rhythm, normal heart sounds and intact distal pulses.    Pulmonary/Chest: Effort normal. No respiratory distress. She has rales (fine crackles bases).   Abdominal: Soft. Bowel sounds are normal. She exhibits no distension. There is no tenderness. There is no CVA tenderness.   Musculoskeletal: Normal range of motion. She exhibits no edema.   Neurological: She is alert and oriented to person, place, and time. She has normal strength. No cranial nerve deficit. Coordination normal.   Skin: Skin is warm and dry. No rash noted.   Nursing note and vitals reviewed.        MDM and ED Course     ED Medication Orders     None             MDM  Number of Diagnoses or Management Options  Diagnosis management comments:    Differential Diagnosis to include but not limited to : pneumonia, asthma, PE, bronchitis, CHF, CAD.           Amount and/or Complexity of Data Reviewed  Clinical lab tests: ordered and reviewed  Tests in the radiology section of CPT: ordered and reviewed  Independent visualization of images, tracings, or specimens: yes    Risk of Complications, Morbidity, and/or Mortality  Presenting  problems: high  Diagnostic procedures: high  Management options: high             Oxygen saturation by pulse oximetry is 91%-94%, Low Normal.  Interventions: Oxygen Administration.    Attending Dr. Helayne Seminole        EKG Interpretation  EKG interpreted independently by Dr. Helayne Seminole    Rate: Normal  Rhythm: sinus rhythm  Axis: Normal  ST-T Segments: nonspec st-t changes  Conduction: No blocks  Impression: Non-specific EKG    No significant change from previous EKG on record.    Helayne Seminole, MD                ED course and reassessment at time of admission and final disposition - DOE unclear source, oxygen levels 91% on ra, requiring oxygen. Will get ct angio to r/o pe.     Results discussed with patient and/or family.    I spoke to admitting physician regarding admission. Pt presentation, course and results relayed to admitting doctor.    Results     Procedure Component Value Units Date/Time    D-Dimer [[id number]]  (Abnormal) Collected:  07/20/17 1951     Updated:  07/20/17 2009     D-Dimer 0.73 (H) ug/mL FEU     B-type Natriuretic Peptide [[id number]] Collected:  07/20/17 1906     Updated:  07/20/17 1938     B-Natriuretic Peptide 37.6 pg/mL     CBC and differential [[id number]]  (Abnormal) Collected:  07/20/17 1906     Updated:  07/20/17 1913     WBC 10.23 x10 3/uL      Hgb 11.2 (L) g/dL      Hematocrit 08.6 (L) %      Platelets 321 x10 3/uL      RBC 4.42 x10 6/uL      MCV 81.9 fL      MCH 25.3 (L) pg      MCHC 30.9 (L) g/dL      RDW 20 (H) %      MPV 9.1 (L) fL      Neutrophils 79.1 %      Lymphocytes Automated 12.3 %       Monocytes 5.8 %      Eosinophils Automated 1.1 %      Basophils Automated 0.4 %      Immature Granulocyte 1.3 %      Nucleated RBC 0.0 /100 WBC      Neutrophils Absolute 8.10 x10 3/uL      Abs Lymph Automated 1.26 x10 3/uL      Abs Mono Automated 0.59 x10 3/uL      Abs Eos Automated 0.11 x10 3/uL      Absolute Baso Automated 0.04 x10 3/uL      Absolute Immature Granulocyte 0.13 (H) x10 3/uL      Absolute NRBC 0.00 x10 3/uL     Troponin I [[id number]] Collected:  07/20/17 1711    Specimen:  Blood Updated:  07/20/17 1745     Troponin I <0.01 ng/mL     Comprehensive metabolic panel [[id number]]  (Abnormal) Collected:  07/20/17 1711    Specimen:  Blood Updated:  07/20/17 1741     Glucose 200 (H) mg/dL      BUN 01.0 mg/dL      Creatinine 0.8 mg/dL      Sodium 272 mEq/L      Potassium 5.8 (H) mEq/L      Chloride 100 mEq/L  CO2 24 mEq/L      Calcium 9.5 mg/dL      Protein, Total 7.6 g/dL      Albumin 3.5 g/dL      AST (SGOT) 35 (H) U/L      ALT 16 U/L      Alkaline Phosphatase 83 U/L      Bilirubin, Total 0.3 mg/dL      Globulin 4.1 (H) g/dL      Albumin/Globulin Ratio 0.9     Anion Gap 17.0 (H)    GFR [[id number]] Collected:  07/20/17 1711     Updated:  07/20/17 1741     EGFR >60.0          Radiology Results (24 Hour)     Procedure Component Value Units Date/Time    XR Chest  AP Portable [[id number]] Collected:  07/20/17 1947    Order Status:  Completed Updated:  07/20/17 1954    Narrative:       Chest single view    CLINICAL INFORMATION: Shortness of breath.    FINDINGS: Comparison is made to previous study dated 07/20/2017.    The cardiomediastinal silhouette is stable. The lungs are clear. There  are no definite effusions.      Impression:        No acute disease.    Rocky Crafts, MD   07/20/2017 7:50 PM                      Procedures    Clinical Impression & Disposition     Clinical Impression  Final diagnoses:   DOE (dyspnea on exertion)        ED Disposition     ED Disposition Condition Date/Time Comment     Observation  Tue Jul 20, 2017  8:29 PM Admitting Physician: Heloise Ochoa A [[id number]]   Diagnosis: DOE (dyspnea on exertion) [242095]   Estimated Length of Stay: < 2 midnights   Tentative Discharge Plan?: Home or Self Care [1]   Patient Class: Observation [104]             New Prescriptions    No medications on file                 Helayne Seminole, MD  07/20/17 2030

## 2017-07-21 DIAGNOSIS — K5900 Constipation, unspecified: Secondary | ICD-10-CM

## 2017-07-21 DIAGNOSIS — J441 Chronic obstructive pulmonary disease with (acute) exacerbation: Secondary | ICD-10-CM

## 2017-07-21 DIAGNOSIS — R0902 Hypoxemia: Secondary | ICD-10-CM

## 2017-07-21 LAB — BASIC METABOLIC PANEL
Anion Gap: 11 (ref 5.0–15.0)
BUN: 12.2 mg/dL (ref 7.0–19.0)
CO2: 31 mEq/L — ABNORMAL HIGH (ref 22–29)
Calcium: 9.1 mg/dL (ref 7.9–10.2)
Chloride: 102 mEq/L (ref 100–111)
Creatinine: 0.8 mg/dL (ref 0.6–1.0)
Glucose: 120 mg/dL — ABNORMAL HIGH (ref 70–100)
Potassium: 3.6 mEq/L (ref 3.5–5.1)
Sodium: 144 mEq/L (ref 136–145)

## 2017-07-21 LAB — CBC
Absolute NRBC: 0 10*3/uL
Hematocrit: 38.2 % (ref 37.0–47.0)
Hgb: 11.5 g/dL — ABNORMAL LOW (ref 12.0–16.0)
MCH: 24.8 pg — ABNORMAL LOW (ref 28.0–32.0)
MCHC: 30.1 g/dL — ABNORMAL LOW (ref 32.0–36.0)
MCV: 82.3 fL (ref 80.0–100.0)
MPV: 8.8 fL — ABNORMAL LOW (ref 9.4–12.3)
Nucleated RBC: 0 /100 WBC (ref 0.0–1.0)
Platelets: 386 10*3/uL (ref 140–400)
RBC: 4.64 10*6/uL (ref 4.20–5.40)
RDW: 20 % — ABNORMAL HIGH (ref 12–15)
WBC: 9.96 10*3/uL (ref 3.50–10.80)

## 2017-07-21 LAB — GLUCOSE WHOLE BLOOD - POCT
Whole Blood Glucose POCT: 117 mg/dL — ABNORMAL HIGH (ref 70–100)
Whole Blood Glucose POCT: 107 mg/dL — ABNORMAL HIGH (ref 70–100)
Whole Blood Glucose POCT: 189 mg/dL — ABNORMAL HIGH (ref 70–100)
Whole Blood Glucose POCT: 298 mg/dL — ABNORMAL HIGH (ref 70–100)

## 2017-07-21 LAB — HEMOLYSIS INDEX: Hemolysis Index: 7 (ref 0–18)

## 2017-07-21 LAB — GFR: EGFR: >60

## 2017-07-21 LAB — FERRITIN: Ferritin: 16.49 ng/mL (ref 4.60–204.00)

## 2017-07-21 LAB — IRON: Iron: 57 ug/dL (ref 40–145)

## 2017-07-21 MED ORDER — CELECOXIB 200 MG PO CAPS
200.0000 mg | ORAL_CAPSULE | Freq: Two times a day (BID) | ORAL | Status: DC
Start: 2017-07-21 — End: 2017-07-22
  Administered 2017-07-21 – 2017-07-22 (×4): 200 mg via ORAL
  Filled 2017-07-21 (×4): qty 1

## 2017-07-21 MED ORDER — DILTIAZEM HCL ER COATED BEADS 300 MG PO CP24
300.0000 mg | ORAL_CAPSULE | Freq: Every day | ORAL | Status: DC
Start: 2017-07-21 — End: 2017-07-22
  Administered 2017-07-21 – 2017-07-22 (×2): 300 mg via ORAL
  Filled 2017-07-21 (×2): qty 1

## 2017-07-21 MED ORDER — LIDOCAINE 5 % EX PTCH
1.0000 | MEDICATED_PATCH | CUTANEOUS | Status: DC
Start: 2017-07-21 — End: 2017-07-22
  Administered 2017-07-21 – 2017-07-22 (×2): 1 via TRANSDERMAL
  Filled 2017-07-21 (×3): qty 1

## 2017-07-21 MED ORDER — ONDANSETRON HCL 4 MG/2ML IJ SOLN
4.0000 mg | Freq: Four times a day (QID) | INTRAMUSCULAR | Status: DC | PRN
Start: 2017-07-21 — End: 2017-07-22

## 2017-07-21 MED ORDER — GLUCAGON 1 MG IJ SOLR (WRAP)
1.0000 mg | INTRAMUSCULAR | Status: DC | PRN
Start: 2017-07-21 — End: 2017-07-22

## 2017-07-21 MED ORDER — DULOXETINE HCL 60 MG PO CPEP
60.0000 mg | ORAL_CAPSULE | Freq: Two times a day (BID) | ORAL | Status: DC
Start: 2017-07-21 — End: 2017-07-22
  Administered 2017-07-21 – 2017-07-22 (×4): 60 mg via ORAL
  Filled 2017-07-21 (×4): qty 1

## 2017-07-21 MED ORDER — DOCUSATE SODIUM 100 MG PO CAPS
100.0000 mg | ORAL_CAPSULE | Freq: Two times a day (BID) | ORAL | Status: DC
Start: 2017-07-21 — End: 2017-07-22
  Administered 2017-07-21 – 2017-07-22 (×2): 100 mg via ORAL
  Filled 2017-07-21 (×2): qty 1

## 2017-07-21 MED ORDER — ASPIRIN 81 MG PO CHEW
81.0000 mg | CHEWABLE_TABLET | Freq: Every day | ORAL | Status: DC
Start: 2017-07-21 — End: 2017-07-22
  Administered 2017-07-21 – 2017-07-22 (×2): 81 mg via ORAL
  Filled 2017-07-21 (×2): qty 1

## 2017-07-21 MED ORDER — ONDANSETRON 4 MG PO TBDP
4.0000 mg | ORAL_TABLET | Freq: Four times a day (QID) | ORAL | Status: DC | PRN
Start: 2017-07-21 — End: 2017-07-22

## 2017-07-21 MED ORDER — PREDNISONE 20 MG PO TABS
40.0000 mg | ORAL_TABLET | Freq: Every morning | ORAL | Status: DC
Start: 2017-07-21 — End: 2017-07-22
  Administered 2017-07-21 – 2017-07-22 (×2): 40 mg via ORAL
  Filled 2017-07-21 (×2): qty 2

## 2017-07-21 MED ORDER — IPRATROPIUM BROMIDE 0.02 % IN SOLN
500.0000 ug | Freq: Four times a day (QID) | RESPIRATORY_TRACT | Status: DC | PRN
Start: 2017-07-21 — End: 2017-07-22

## 2017-07-21 MED ORDER — HYDROCODONE-ACETAMINOPHEN 5-325 MG PO TABS
1.0000 | ORAL_TABLET | ORAL | Status: DC | PRN
Start: 2017-07-21 — End: 2017-07-22
  Administered 2017-07-21 – 2017-07-22 (×4): 1 via ORAL
  Filled 2017-07-21 (×4): qty 1

## 2017-07-21 MED ORDER — GLUCOSE 40 % PO GEL
15.0000 g | ORAL | Status: DC | PRN
Start: 2017-07-21 — End: 2017-07-22

## 2017-07-21 MED ORDER — FLEET ENEMA 7-19 GM/118ML RE ENEM
1.0000 | ENEMA | Freq: Once | RECTAL | Status: AC
Start: 2017-07-21 — End: 2017-07-21
  Administered 2017-07-21: 18:00:00 1 via RECTAL

## 2017-07-21 MED ORDER — INSULIN GLARGINE 100 UNIT/ML SC SOLN
14.0000 [IU] | Freq: Every morning | SUBCUTANEOUS | Status: DC
Start: 2017-07-21 — End: 2017-07-22
  Administered 2017-07-21 – 2017-07-22 (×2): 14 [IU] via SUBCUTANEOUS
  Filled 2017-07-21 (×2): qty 14

## 2017-07-21 MED ORDER — GABAPENTIN 300 MG PO CAPS
900.0000 mg | ORAL_CAPSULE | Freq: Two times a day (BID) | ORAL | Status: DC
Start: 2017-07-21 — End: 2017-07-22
  Administered 2017-07-21 – 2017-07-22 (×4): 900 mg via ORAL
  Filled 2017-07-21 (×4): qty 3

## 2017-07-21 MED ORDER — TIOTROPIUM BROMIDE MONOHYDRATE 18 MCG IN CAPS
18.0000 ug | ORAL_CAPSULE | Freq: Every morning | RESPIRATORY_TRACT | Status: DC
Start: 2017-07-21 — End: 2017-07-22
  Administered 2017-07-21 – 2017-07-22 (×2): 18 ug via RESPIRATORY_TRACT
  Filled 2017-07-21: qty 5

## 2017-07-21 MED ORDER — METFORMIN HCL 850 MG PO TABS
850.0000 mg | ORAL_TABLET | Freq: Two times a day (BID) | ORAL | Status: DC
Start: 2017-07-21 — End: 2017-07-22
  Filled 2017-07-21 (×5): qty 1

## 2017-07-21 MED ORDER — FAMOTIDINE 20 MG PO TABS
20.0000 mg | ORAL_TABLET | Freq: Every day | ORAL | Status: DC
Start: 2017-07-21 — End: 2017-07-22
  Administered 2017-07-21 – 2017-07-22 (×2): 20 mg via ORAL
  Filled 2017-07-21 (×2): qty 1

## 2017-07-21 MED ORDER — METOPROLOL SUCCINATE ER 50 MG PO TB24
100.0000 mg | ORAL_TABLET | Freq: Every day | ORAL | Status: DC
Start: 2017-07-21 — End: 2017-07-22
  Administered 2017-07-21 – 2017-07-22 (×2): 100 mg via ORAL
  Filled 2017-07-21 (×2): qty 2

## 2017-07-21 MED ORDER — CETIRIZINE HCL 10 MG PO TABS
10.0000 mg | ORAL_TABLET | Freq: Every day | ORAL | Status: DC
Start: 2017-07-21 — End: 2017-07-22
  Administered 2017-07-21 – 2017-07-22 (×2): 10 mg via ORAL
  Filled 2017-07-21 (×2): qty 1

## 2017-07-21 MED ORDER — INSULIN LISPRO 100 UNIT/ML SC SOLN
1.0000 [IU] | Freq: Three times a day (TID) | SUBCUTANEOUS | Status: DC | PRN
Start: 2017-07-21 — End: 2017-07-22
  Administered 2017-07-21 – 2017-07-22 (×2): 1 [IU] via SUBCUTANEOUS
  Administered 2017-07-22: 17:00:00 3 [IU] via SUBCUTANEOUS
  Filled 2017-07-21: qty 3
  Filled 2017-07-21: qty 9
  Filled 2017-07-21: qty 3

## 2017-07-21 MED ORDER — FUROSEMIDE 40 MG PO TABS
40.0000 mg | ORAL_TABLET | Freq: Two times a day (BID) | ORAL | Status: DC
Start: 2017-07-21 — End: 2017-07-22
  Administered 2017-07-21 – 2017-07-22 (×4): 40 mg via ORAL
  Filled 2017-07-21 (×4): qty 1

## 2017-07-21 MED ORDER — DEXTROSE 50 % IV SOLN
12.5000 g | INTRAVENOUS | Status: DC | PRN
Start: 2017-07-21 — End: 2017-07-22

## 2017-07-21 MED ORDER — INSULIN LISPRO 100 UNIT/ML SC SOLN
1.0000 [IU] | Freq: Every evening | SUBCUTANEOUS | Status: DC | PRN
Start: 2017-07-21 — End: 2017-07-22

## 2017-07-21 MED ORDER — TRAZODONE HCL 50 MG PO TABS
50.0000 mg | ORAL_TABLET | Freq: Every evening | ORAL | Status: DC
Start: 2017-07-21 — End: 2017-07-22
  Administered 2017-07-21: 21:00:00 50 mg via ORAL
  Filled 2017-07-21: qty 1

## 2017-07-21 MED ORDER — SODIUM POLYSTYRENE SULFONATE 15 GM/60ML PO SUSP
30.0000 g | Freq: Once | ORAL | Status: AC
Start: 2017-07-21 — End: 2017-07-21
  Administered 2017-07-21: 02:00:00 30 g via ORAL
  Filled 2017-07-21: qty 120

## 2017-07-21 MED ORDER — SENNA 8.6 MG PO TABS
17.2000 mg | ORAL_TABLET | Freq: Every day | ORAL | Status: DC
Start: 2017-07-21 — End: 2017-07-22
  Administered 2017-07-21 – 2017-07-22 (×2): 17.2 mg via ORAL
  Filled 2017-07-21 (×2): qty 2

## 2017-07-21 MED ORDER — ENOXAPARIN SODIUM 40 MG/0.4ML SC SOLN
40.0000 mg | Freq: Every day | SUBCUTANEOUS | Status: DC
Start: 2017-07-21 — End: 2017-07-22
  Administered 2017-07-21 – 2017-07-22 (×2): 40 mg via SUBCUTANEOUS
  Filled 2017-07-21 (×2): qty 0.4

## 2017-07-21 MED ORDER — LEVOTHYROXINE SODIUM 50 MCG PO TABS
50.0000 ug | ORAL_TABLET | Freq: Every day | ORAL | Status: DC
Start: 2017-07-21 — End: 2017-07-22
  Administered 2017-07-21 – 2017-07-22 (×2): 50 ug via ORAL
  Filled 2017-07-21 (×2): qty 1

## 2017-07-21 MED ORDER — NALOXONE HCL 0.4 MG/ML IJ SOLN (WRAP)
0.2000 mg | INTRAMUSCULAR | Status: DC | PRN
Start: 2017-07-21 — End: 2017-07-22

## 2017-07-21 MED ORDER — PREDNISONE 5 MG PO TABS
7.0000 mg | ORAL_TABLET | Freq: Every day | ORAL | Status: DC
Start: 2017-07-21 — End: 2017-07-21
  Administered 2017-07-21: 11:00:00 7 mg via ORAL
  Filled 2017-07-21: qty 1
  Filled 2017-07-21 (×2): qty 2

## 2017-07-21 MED ORDER — ATORVASTATIN CALCIUM 20 MG PO TABS
20.0000 mg | ORAL_TABLET | Freq: Every evening | ORAL | Status: DC
Start: 2017-07-21 — End: 2017-07-22
  Administered 2017-07-21: 21:00:00 20 mg via ORAL
  Filled 2017-07-21: qty 1

## 2017-07-21 MED ORDER — HYDRALAZINE HCL 25 MG PO TABS
25.0000 mg | ORAL_TABLET | Freq: Two times a day (BID) | ORAL | Status: DC
Start: 2017-07-21 — End: 2017-07-22
  Administered 2017-07-21 – 2017-07-22 (×3): 25 mg via ORAL
  Filled 2017-07-21 (×3): qty 1

## 2017-07-21 MED ORDER — POLYETHYLENE GLYCOL 3350 17 G PO PACK
17.0000 g | PACK | Freq: Every day | ORAL | Status: DC | PRN
Start: 2017-07-21 — End: 2017-07-22
  Administered 2017-07-22: 10:00:00 17 g via ORAL
  Filled 2017-07-21: qty 1

## 2017-07-21 NOTE — Plan of Care (Signed)
Ask3Teach3 Program    Education about New Medications and their Side effects    Dear Rachael [name],    Its been a pleasure taking care of you during your hospitalization here at [facility]. We have initiated a new program to educate our patients and/or their family members or designated personnel about the new medications started by your physicians and their indications along with the possible side effects. Multiple studies have shown that patients started on new medications are often unaware of the names of the medication along with the indications and their side effects which leads to decreased compliance with the medications.    During our conversation today on 07/21/2017  11:07 AM I have explained to you the name of the new medication and the indication along with some possible common side effects. Listed below are some of the new medications started during this hospitalization.     Please call the Nurse if you have any side effects while in hospital.     Please call 911 if you have any life threatening symptoms after you are discharged from the hospital.    Please inform your Primary care physician for common side effects which are not life threatening after discharge.    Medication Name: Tiotropium(Spiriva)  This Medication is used for:  COPD  Asthma  Bronchospasm    Common Side Effects are:  Dry mouth  Hoarseness  Blurred Vision    A note from your nurse:  Call your nurse immediately if you notice itching, hives, swelling or trouble breathing      Thank you for your time.    Alice Reichert, RT  07/21/2017  11:07 AM  [facility]  [address]  [place], [place]  [address]

## 2017-07-21 NOTE — UM Notes (Shared)
81 yo woman to ED 07/20/17 1845 with exertional dyspnea for the past few weeks, improved in ED with 2L nasal canula O2 , sats at 92-93%. D Dimer mildy elevated at 0.73. CTA pending.   96.9, HR 101, RR 22, BP 149/87, sats 91%    Past Medical History:   Diagnosis Date   . Anxiety    . Arthritis    . 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 Chronic cholecystitis with calculus 10/31/2015   . Chronic obstructive pulmonary disease    . Gastroesophageal reflux disease    . Hiatal hernia    . Hyperlipidemia    . Hypertension    . Hypothyroidism    . Incontinent of urine 2014    -WEARS DIAPERS-WILL BRING HER OWN   . Low back pain    . Lymphedema of right lower extremity    . PMR (polymyalgia rheumatica) 01/07/2017   . Polymyalgia rheumatica 09/2014   . Sleep apnea     USES CPAP NIGHTLY   . Type 2 diabetes mellitus, controlled    . Type II or unspecified type diabetes mellitus without mention of complication, not stated as uncontrolled    . Venous insufficiency of right leg 2016    also has lymphedema-KEEPS HER LEGS WRAPPED EXCEPT FOR WHEN SHE BATHES     H&H 11.2/36.2, Glucose 200, K 5.8, AST 35, AG 17, Ddimer .73  CTA chest : No evidence pulmonary embolus. Nonspecific mild diffuse groundglass opacity.  CXRY : No acute abn.  ABN EKG : NORMAL SINUS RHYTHM NONSPECIFIC ST ABNORMALITY  On exam : appears distressed, + rales  In ED : O2 2L NC  Admit : DOE with mild hypoxemia and tachypnea, Elevated Ddimer, r/o PE, Hyperkalemia    MD assessment/plan :  -Dyspnea on exertion. PE Ruled out    Oxygen supplementation.  Mildly hypoxic on arrival.  Patient follows up with Dr. Trude Mcburney for chronic obstructive pulmonary disease  Chest CT done ruled out PE.  Continue nebulization  Ordered bedside spirometry.  Pulmonary follow-up if needed  -Abdominal distention with constipation   Based on the CT scan.  It shows patient has constipation and increased distention.  We will check abdominal x-ray.  Rule out ileus or obstruction.  May  require abdominal CT if not improving  Bowel regimen ordered  -Hyperkalemia   1 dose of Kayexalate given  EKG reviewed  Monitor BMP in a.m.  -COPD   Patient does not appear to be in chronic obstructive pulmonary disease exacerbation.  Continue inhalers, Spiriva.  Continue nebulization as needed.  -Type 2 DM controlled  Insulin as needed.  NovoLog sliding scale  Continue Lantus home dose  -Hypothyroidism  Continue levothyroxin  -Hypertension , well controlled.  Continue Cardizem and metoprolol  -GERD   Continue Pepcid  -Obesity   Counseling done  -Sleep apnea : Continue CPAP.  -Mild normocytic anemia   Check Iron and ferritin   Core Measures:     DVT Prohylaxis: Lovenox        OBS admit order 07/20/17 2029, tele, po ASA, Lipitor, Zyrtec, Cardizem, Cymbalta, 40mg  SQ Lovenox daily, po Lasix, Neurontin, Hydralazine, SC Lantus 14units qAM, po Synthyroid, Metformin, Metoprolol, Prednisone, Senna, Spiriva IN, po Trazodone, prn Norco, SS Insulin, prn IV Narcan, prn IV Zofran, prn Miralax, diet as tol, I/O, 30g po Kayexelate xs 1          On tele 07/21/17 :  98.3, HR to 108-105,  RR 20, BP 150/81, sats 94% O2 2L NC  Glucose 120, CO2 31, Hgb 11.5    1T Norco given xs 2 today      Pulmonary function testing 07/21/17 :  INDICATION:  Shortness of breath.  FINDINGS:  Forced vital capacity is 0.73 liters, that is 37% predicted.  FEV1 is 0.55  liters, that is 38% predicted.  FEV1/FVC ratio is normal at 75%.  No  bronchodilator treatment was given.  IMPRESSION:  Suboptimal study, study suggestive of severe restrictive defect.  Clinical  correlation is recommended.      MD assessment/plan 07/21/17 :  Abdominal distention and persistent constipation  -We will give Fleet enema today.  -Starting Colace 100 mg by mouth every 12 hours.  Chronic obstructive pulmonary disease exacerbation  -Starting prednisone 40 mg by mouth daily, 1st dose now.  -Continue with the DuoNeb's every 6 hours as needed for shortness of breath/wheezing.  -Titrating  supplemental oxygenation  Diabetes mellitus type 2-controlled  -Low-dose correctional sliding scale insulin and continue  Checking fingersticks portage at bedtime.  Discontinued with metformin as patient received IV contrast last night  Hypothyroidism  Continue with levothyroxine 50 mcg daily  DVT Prohylaxis:lovenox         On tele 07/22/17 :  97.9, HR 102-100, RR 20, BP 148/95, sats 95% - O2 3L NC  Glucose 135    1T Norco given xs 1 so far today      ABD XRY 07/22/17 : nonspecific bowel gas pattern        ORDERS : tele, PULM, po ASA, Lipitor, Celebrex, Cardizem, Colace, Cymbalta, SQ Lovenox 40mg  daily, po Pepcid, Lasix, Neurontin, Hydralazine, Synthyroid, Metformin, Metoprolol, Prednisone, Senna, Spiriva IN, po Trazodone, prn Norco, SS Insulin, prn Atrovent neb, prn IV Narcan, prn IV Zofran, prn Miralax, supp O2, diet as tol, home CPAP @ night, I/O

## 2017-07-21 NOTE — Plan of Care (Signed)
Problem: Moderate/High Fall Risk Score >5  Goal: Patient will remain free of falls  Outcome: Progressing

## 2017-07-21 NOTE — Progress Notes (Signed)
[facility] HOSPITAL[facility] : PROGRESS NOTE  PATIENT INFORMATION:    Date/Time: 07/21/2017 / 4:25 PM   Admit Date:07/20/2017  Patient Name:Rachael [name]   OZH:[id number]   PCP: Kerin Perna, DO  Attending Physician:Fontella Shan, Konrad Saha, MD     ASSESSMENT AND PLAN   Abdominal distention and persistent constipation  -We will give Fleet enema today.  -Starting Colace 100 mg by mouth every 12 hours.      Chronic obstructive pulmonary disease exacerbation  -Starting prednisone 40 mg by mouth daily, 1st dose now.  -Continue with the DuoNeb's every 6 hours as needed for shortness of breath/wheezing.  -Titrating supplemental oxygenation    Diabetes mellitus type 2-controlled  -Low-dose correctional sliding scale insulin and continue  Checking fingersticks portage at bedtime.  Discontinued with metformin as patient received IV contrast last night      Hypothyroidism  Continue with levothyroxine 50 mcg daily            DVT Prohylaxis:lovenox   Central Line/Foley Catheter/PICC line status: none  Code Status: Full Code  Disposition:home  Type of Admission:Observation  Anticipated Length of Stay: 24 hours  Medical Necessity for stay: During Fleet enema today as well as titrating supplemental oxygenation, while keeping pulse oximetry greater than 88 percent.    SUBJECTIVE:    07/21/17: Persistent abdominal distention.  Minimal stool output today morning, no nausea, vomiting, improving shortness of breath, no fever, chills, no cough.      OBJECTIVE   BP 124/71   Pulse (!) 118   Temp 97.3 F (36.3 C) (Oral)   Resp 18   Ht 1.473 m (4\' 10" )   Wt 86.1 kg (189 lb 12.8 oz)   SpO2 94%   BMI 39.67 kg/m       Intake/Output Summary (Last 24 hours) at 07/21/17 1625  Last data filed at 07/21/17 0830   Gross per 24 hour   Intake              360 ml   Output              500 ml   Net             -140 ml       PHYSICAL EXAM:  Constitutional: Obese woman sitting up in the bed in mild respiratory distress  Head: Normocephalic and  atraumatic.  Eyes- pupils equal and reactive, extraocular eye movements intact, sclera anicteric  Ears - external ear canals normal, right ear normal, left ear normal  Nose - normal and patent, no erythema, discharge or polyps and normal nontender sinuses  Mouth - mucous membranes moist, pharynx normal without lesions  Neck: Normal range of motion. Neck supple. No JVD present. No tracheal deviation present. No thyromegaly present.   Cardiovascular: Normal rate, regular rhythm, normal heart sounds and intact distal pulses.  Exam reveals no gallop and no friction rub. No murmur heard.  Pulmonary/Chest: Diminished breath sounds bilateral lower lung fields   Abdominal: Soft. Bowel sounds are normal. Patient exhibits no distension and no mass was palpable. There is no tenderness. There is no rebound and no guarding.   Musculoskeletal: Normal range of motion. Patient exhibits no edema and no tenderness.   Lymphadenopathy:  Patient has no cervical adenopathy.   Neurological: Patient is alert and oriented to person, place, and time and has normal reflexes. No cranial nerve deficit.  Normal muscle tone. Coordination normal.   Skin: Skin is warm. No rash noted. Patient is not diaphoretic.  No erythema. No pallor.   Psychiatric: Has normal mood and affect. Behavior is normal. Judgment and thought content normal.    LABORATORY AND RADIOLOGY RESULTS   Labs and radiology reports have been reviewed.    HOSPITALIST   Signed by:   Judson Roch  07/21/2017 4:25 PM    *This note was generated by the Epic EMR system/ Dragon speech recognition and may contain inherent errors or omissions not intended by the user. Grammatical errors, random word insertions, deletions, pronoun errors and incomplete sentences are occasional consequences of this technology due to software limitations. Not all errors are caught or corrected. If there are questions or concerns about the content of this note or information contained within the body of this  dictation they should be addressed directly with the author for clarification

## 2017-07-21 NOTE — Plan of Care (Signed)
Pain Management Plan    Education about your Pain Management.    Dear Rachael [name],    It is my pleasure to care for you during your hospitalization here at [facility]. You have reported that you are not experiencing pain at this time. If at any point you experience pain, please notify a member of the healthcare team. We are committed to meeting your unique needs during your hospitalization and can adjust your plan of care accordingly.      Thank you for your time.    Jodi Marble, RN  07/21/2017  4:38 AM  [facility]  [address]  [place], [place]  [address]

## 2017-07-21 NOTE — Progress Notes (Signed)
Met with patient this afternoon for assessment. Patient lives at [facility]. Said she was independent with ADL's prior to admission. Patient does not drive, said her daughter provides any transport needs. Patient takes all 3 meals at ALF, she does not have to cook. Said she has a walk-in shower with a shower chair and grab bars which she was using. Patient was using a rollator for ambulation assist, said she has no other DME. Patient said she had home PT through an agency provided by the ALF, but has since been d/c'd, did not recall agency name. No other CM needs identified. Aware of CM contact.       07/21/17 1648   Patient Type   Within 30 Days of Previous Admission? No   Healthcare Decisions   Interviewed: Patient   Orientation/Decision Making Abilities of Patient Alert and Oriented x3, able to make decisions   Advance Directive Patient has advance directive, copy not in chart   Advance Directive not in Chart Copy requested from family/decision maker   Healthcare Agent Appointed Yes   Healthcare Agent's Name daughter, Lavell Anchors   Healthcare Agent's Phone Number home [phone], cell [phone]   Prior to admission   Prior level of function Independent with ADLs;Ambulates with assistive device   Type of Residence Assisted living  (lives at [facility])   Have running water, electricity, heat, etc? Yes   Living Arrangements Alone   How do you get to your MD appointments? daughter   How do you get your groceries? daughter   Who fixes your meals? facility   Who does your laundry? daughter/facility   Who picks up your prescriptions? daughter/facility   Dressing Independent   Grooming Independent   Feeding Independent   Bathing Independent   Toileting Independent   DME Currently at Home Grab bars;Other (Comment)  (has a shower chair and rollator)   Name of Prior Assisted Living Facility [facility]   Adult [employer] (APS) involved? No   Discharge Planning   Support Systems Children   Patient  expects to be discharged to: ALF   Anticipated [place] plan discussed with: Same as interviewed   Mode of transportation: Private car (family member)   Consults/Providers   PT Evaluation Needed 1   OT Evalulation Needed 1   SLP Evaluation Needed 2   Outcome Palliative Care Screen Screened but did not meet criteria for intervention   Correct PCP listed in Epic? Yes   Important Message from [facility] Notice   Patient received 1st IMM Letter? n/a  (patient in OBS status)

## 2017-07-21 NOTE — H&P (Addendum)
[facility] Hospitalist H&P    Date Time : 07/21/2017  12:53 AM  Patient Name:Rachael [name]  NWG:[id number]  PCP: Kerin Perna, DO  Admit Date:07/20/2017  Attending Physician:Sanaya Gwilliam, Allene Pyo, MD    Assessment:   Active Problems:    DOE (dyspnea on exertion)     1. Dyspnea on exertion   2. Abdominal distention with constipation   3. Hyperkalemia   4. COPD   5. Type 2 DM  6. Hypothyroidism  7. Hypertension   8. GERD   9. Obesity   10. Sleep apnea   11. Mild normocytic anemia     Plan:      Dyspnea on exertion. PE Ruled out    Oxygen supplementation.  Mildly hypoxic on arrival.  Patient follows up with Dr. Trude Mcburney for chronic obstructive pulmonary disease  Chest CT done ruled out PE.  Continue nebulization  Ordered bedside spirometry.  Pulmonary follow-up if needed    Abdominal distention with constipation   Based on the CT scan.  It shows patient has constipation and increased distention.  We will check abdominal x-ray.  Rule out ileus or obstruction.  May require abdominal CT if not improving  Bowel regimen ordered    Hyperkalemia   1 dose of Kayexalate given  EKG reviewed  Monitor BMP in a.m.    COPD   Patient does not appear to be in chronic obstructive pulmonary disease exacerbation.  Continue inhalers, Spiriva.  Continue nebulization as needed.    Type 2 DM controlled  Insulin as needed.  NovoLog sliding scale  Continue Lantus home dose      Hypothyroidism  Continue levothyroxin    Hypertension , well controlled.  Continue Cardizem and metoprolol    GERD   Continue Pepcid      Obesity   Counseling done    Sleep apnea : Continue CPAP.    Mild normocytic anemia   Check Iron and ferritin       Core Measures:     DVT Prohylaxis: Lovenox        Chief Complaint:     Chief Complaint   Patient presents with   . Shortness of Breath       History of Present Illness:   Rachael [name] is a 81 y.o. female who has history of   Past Surgical History:   Procedure Laterality Date   . BACK SURGERY  2014   .  CARPAL TUNNEL RELEASE Bilateral 1978   . HYSTERECTOMY  1985   . ROBOTIC, CHOLECYSTECTOMY N/A 10/31/2015    Procedure: ROBOT ASSISTED, LAPAROSCOPIC, CHOLECYSTECTOMY, UMBILICAL HERNIA REPAIR;  Surgeon: Alen Blew, MD;  Location: [facility];  Service: General;  Laterality: N/A;    Past Medical History:   Diagnosis Date   . Anxiety    . Arthritis    . 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 Chronic cholecystitis with calculus 10/31/2015   . Chronic obstructive pulmonary disease    . Gastroesophageal reflux disease    . Hiatal hernia    . Hyperlipidemia    . Hypertension    . Hypothyroidism    . Incontinent of urine 2014    -WEARS DIAPERS-WILL BRING HER OWN   . Low back pain    . Lymphedema of right lower extremity    . PMR (polymyalgia rheumatica) 01/07/2017   . Polymyalgia rheumatica 09/2014   . Sleep apnea     USES CPAP NIGHTLY   .  Type 2 diabetes mellitus, controlled    . Type II or unspecified type diabetes mellitus without mention of complication, not stated as uncontrolled    . Venous insufficiency of right leg 2016    also has lymphedema-KEEPS HER LEGS WRAPPED EXCEPT FOR WHEN SHE BATHES   came with the chief complaint of exertional shortness of breath over a month.     Patient is 81 year old lady with a past medical history of hypertension, hyperlipidemia, hypothyroidism, polymyalgia rheumatica, sleep apnea, admitted for the evaluation of exertional shortness of breath ongoing for over a month.  Patient's shortness of breath is progressive in nature, not associated with chest pain.  No dizziness or lightheadedness.  No cough or sputum production.  No nausea, vomiting.  She does complain of constipation over the last week with minimal relief.  No fevers or chills.  Patient was admitted to the hospital in the month of March this year.  As per the daughter, patient's oxygen saturation has been dropping. Patient follows with Dr. Trude Mcburney as outpatient.     Past Medical History:      Past Medical History:   Diagnosis Date   . Anxiety    . Arthritis    . 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 Chronic cholecystitis with calculus 10/31/2015   . Chronic obstructive pulmonary disease    . Gastroesophageal reflux disease    . Hiatal hernia    . Hyperlipidemia    . Hypertension    . Hypothyroidism    . Incontinent of urine 2014    -WEARS DIAPERS-WILL BRING HER OWN   . Low back pain    . Lymphedema of right lower extremity    . PMR (polymyalgia rheumatica) 01/07/2017   . Polymyalgia rheumatica 09/2014   . Sleep apnea     USES CPAP NIGHTLY   . Type 2 diabetes mellitus, controlled    . Type II or unspecified type diabetes mellitus without mention of complication, not stated as uncontrolled    . Venous insufficiency of right leg 2016    also has lymphedema-KEEPS HER LEGS WRAPPED EXCEPT FOR WHEN SHE BATHES       Past Surgical History:     Past Surgical History:   Procedure Laterality Date   . BACK SURGERY  2014   . CARPAL TUNNEL RELEASE Bilateral 1978   . HYSTERECTOMY  1985   . ROBOTIC, CHOLECYSTECTOMY N/A 10/31/2015    Procedure: ROBOT ASSISTED, LAPAROSCOPIC, CHOLECYSTECTOMY, UMBILICAL HERNIA REPAIR;  Surgeon: Alen Blew, MD;  Location: [facility];  Service: General;  Laterality: N/A;       Past Family History:    reviewed     Social History:     History   Alcohol Use   . 1.8 oz/week   . 3 Glasses of wine per week     Comment: social     History   Drug Use No     History   Smoking Status   . Former Smoker   . Packs/day: 1.00   . Years: 25.00   . Quit date: 11/16/1998   Smokeless Tobacco   . Never Used     Social History     Social History   . Marital status: Widowed     Spouse name: N/A   . Number of children: N/A   . Years of education: N/A     Social History Main Topics   . Smoking status: Former Smoker  Packs/day: 1.00     Years: 25.00     Quit date: 11/16/1998   . Smokeless tobacco: Never Used   . Alcohol use 1.8 oz/week     3 Glasses of wine per week       Comment: social   . Drug use: No   . Sexual activity: Not Asked     Other Topics Concern   . None     Social History Narrative   . None       Allergies:     Allergies   Allergen Reactions   . Tolectin [Tolmetin] Swelling   . Lyrica [Pregabalin]    . Latex Itching     Medications:     Prescriptions Prior to Admission   Medication Sig Dispense Refill Last Dose   . celecoxib (CELEBREX) 200 MG capsule Take 200 mg by mouth 2 (two) times daily.      . cetirizine (ZYRTEC) 10 MG tablet Take 10 mg by mouth daily.      Marland Kitchen lactobacillus acidophilus & bulgar (LACTINEX) chewable tablet Chew 1 tablet by mouth 3 (three) times daily with meals.      . lidocaine (LIDODERM) 5 % Place 1 patch onto the skin daily.Remove & Discard patch within 12 hours or as directed by MD      . loteprednol (LOTEMAX) 0.2 % Suspension 1 drop 4 (four) times daily.      . Pitavastatin Calcium (LIVALO) 2 MG Tab Take by mouth nightly.      Bertram Gala Glycol-Propyl Glycol (SYSTANE ULTRA OP) Apply to eye.      . raNITIdine (ZANTAC) 150 MG tablet Take 150 mg by mouth 2 (two) times daily.      Marland Kitchen senna (SENOKOT) 8.6 MG tablet Take 2 tablets by mouth daily.      Marland Kitchen tiotropium (SPIRIVA) 18 MCG inhalation capsule Place 18 mcg into inhaler and inhale daily.      Marland Kitchen aspirin 81 MG chewable tablet Chew 1 tablet (81 mg total) by mouth daily. 30 tablet 0 Taking   . dilTIAZem (CARDIZEM CD) 300 MG 24 hr capsule Take 1 capsule (300 mg total) by mouth daily. 90 capsule 1 Taking   . DULoxetine (CYMBALTA) 60 MG capsule Take 60 mg by mouth 2 (two) times daily.   Taking   . furosemide (LASIX) 40 MG tablet Take 40 mg by mouth 2 (two) times daily.   Taking   . gabapentin (NEURONTIN) 300 MG capsule Take 900 mg by mouth 2 (two) times daily.       Taking   . guaiFENesin (MUCINEX) 600 MG 12 hr tablet Take 1 tablet (600 mg total) by mouth every 12 (twelve) hours. (Patient taking differently: Take 600 mg by mouth daily.    )   Taking   . hydrALAZINE (APRESOLINE) 25 MG tablet Take 1 tablet  by mouth 2 (two) times daily.  0 Taking   . HYDROcodone-acetaminophen (NORCO) 5-325 MG per tablet Take 1 tablet by mouth every 4 (four) hours as needed for Pain. 45 tablet 0 Taking   . insulin glargine (LANTUS) 100 UNIT/ML injection Inject 14 Units into the skin every morning. 5 pen 0 Taking   . insulin lispro (HUMALOG) 100 UNIT/ML injection Inject 1-8 Units into the skin 3 (three) times daily before meals as needed (High blood glucose. See administration instructions.). 3 pen 3 Taking   . ipratropium (ATROVENT) 0.02 % nebulizer solution Take 500 mcg by nebulization 4 (four) times daily.  1 Taking   . lactobacillus/streptococcus (RISAQUAD) Cap Take 1 capsule by mouth daily.   Taking   . levothyroxine (SYNTHROID, LEVOTHROID) 50 MCG tablet Take 50 mcg by mouth Once a day at 6:00am.   Taking   . losartan-hydrochlorothiazide (HYZAAR) 100-25 MG per tablet Take 1 tablet by mouth every morning.   Taking   . metFORMIN (GLUCOPHAGE-XR) 750 MG 24 hr tablet Take 750 mg by mouth nightly.   Taking   . metoprolol succinate XL (TOPROL-XL) 25 MG 24 hr tablet Take 100 mg by mouth daily.       Taking   . pantoprazole (PROTONIX) 40 MG tablet Take 40 mg by mouth daily.      Taking   . polyethylene glycol (MIRALAX) packet Take 17 g by mouth daily as needed (Constipation).   Taking   . potassium chloride (K-TAB,KLOR-CON) 10 MEQ tablet Take 10 mEq by mouth daily.      Taking   . predniSONE (DELTASONE) 5 MG tablet Take 7 mg by mouth daily.       Taking   . SYMBICORT 160-4.5 MCG/ACT inhaler Inhale 2 puffs into the lungs 2 (two) times daily.      Taking   . traZODone (DESYREL) 100 MG tablet Take 50 mg by mouth nightly.       Taking       Review of Systems:   A comprehensive review of systems was negative except for the positives   Constitutional: chills, weight loss, malaise/fatigue and diaphoresis.   HENT:  hearing loss, ear pain, nosebleeds, congestion, sore throat, neck pain, tinnitus and ear discharge.    Eyes:blurred vision, double  vision, photophobia, pain, discharge and redness.   Respiratory: cough, hemoptysis, sputum production, shortness of breath, wheezing and stridor.    Cardiovascular: chest pain, palpitations, orthopnea, claudication, leg swelling and PND.   Gastrointestinal:  Heartburn, nausea, vomiting,  ++abdominal pain, diarrhea, constipation, blood in stool and melena. Constipation +  Genitourinary: dysuria, urgency, frequency, hematuria and flank pain.   Musculoskeletal: Myalgias, back pain, joint pain and falls.   Skin: iitching and rash.   Neurological: dizziness, tingling, tremors, sensory change, speech change, focal weakness, seizures, loss of consciousness, weakness and headaches.   Endo/Heme/Allergies:environmental allergies and polydipsia. Does not bruise/bleed easily.   Psychiatric/Behavioral: depression, suicidal ideas, hallucinations, memory loss and substance abuse. The patient is not nervous/anxious and does not have insomnia     Procedures performed:   No orders of the defined types were placed in this encounter.    Physical Exam:   Vitals reviewed   height is 1.473 m (4\' 10" ) and weight is 86.2 kg (190 lb). Her oral temperature is 98.1 F (36.7 C). Her blood pressure is 149/73 and her pulse is 92. Her respiration is 20 and oxygen saturation is 95%.   Body mass index is 39.71 kg/m.  Vitals:    07/20/17 1539 07/20/17 1842 07/20/17 2203 07/20/17 2316   BP: 122/62 149/87 141/80 149/73   Pulse: 89 95 (!) 101 92   Resp: 14 19 22 20    Temp: (!) 96.9 F (36.1 C)   98.1 F (36.7 C)   TempSrc: Temporal Artery   Oral   SpO2: 92% 91% 92% 95%   Weight: 83.9 kg (185 lb)   86.2 kg (190 lb)   Height: 1.473 m (4\' 10" )   1.473 m (4\' 10" )     Intake and Output Summary (Last 24 hours) at Date Time    Intake/Output Summary (Last 24 hours)  at 07/21/17 0053  Last data filed at 07/20/17 2316   Gross per 24 hour   Intake                0 ml   Output              300 ml   Net             -300 ml         Constitutional: Patient is  oriented to person, place, and time. Patient appears well-developed and well-nourished.     Head: Normocephalic and atraumatic.  Eyes- pupils equal and reactive, extraocular eye movements intact, sclera anicteric  Ears - external ear canals normal, right ear normal, left ear normal  Nose - normal and patent, no erythema, discharge or polyps and normal nontender sinuses  Mouth - mucous membranes moist, pharynx normal without lesions  Neck: Normal range of motion. Neck supple. No JVD present. No tracheal deviation present. No thyromegaly present.   Cardiovascular: Normal rate, regular rhythm, normal heart sounds and intact distal pulses.  Exam reveals no gallop and no friction rub. No murmur heard.  Pulmonary/Chest: Effort normal and breath sounds normal. No stridor. No respiratory distress. Patient has no wheezes. No rales were present.  Exhibits no tenderness.   Abdominal: Soft. Bowel sounds are normal. Patient exhibits  Abdominal ++distension and no mass was palpable. There is no tenderness. There is no rebound and no guarding.   Musculoskeletal: Normal range of motion. Patient exhibits no edema and no tenderness.   Lymphadenopathy:  Patient has no cervical adenopathy.   Neurological: Patient is alert and oriented to person, place, and time and has normal reflexes. No cranial nerve deficit.  Normal muscle tone. Coordination normal.   Skin: Skin is warm. No rash noted. Patient is not diaphoretic. No erythema. No pallor.   Psychiatric: Has normal mood and affect. Behavior is normal. Judgment and thought content normal.      Coagulation Profile:           Medications:   No current facility-administered medications for this encounter.       Reviewed Medications  CBC review:   Recent Labs  Lab 07/20/17  1906   WBC 10.23   Hgb 11.2*   Hematocrit 36.2*   Platelets 321   MCV 81.9   RDW 20*   Neutrophils 79.1   Lymphocytes Automated 12.3   Eosinophils Automated 1.1   Immature Granulocyte 1.3   Neutrophils Absolute 8.10    Absolute Immature Granulocyte 0.13*         Chem Review:  Recent Labs  Lab 07/20/17  1711   Sodium 141   Potassium 5.8*   Chloride 100   CO2 24   BUN 13.7   Creatinine 0.8   Glucose 200*   Calcium 9.5   Bilirubin, Total 0.3   AST (SGOT) 35*   ALT 16   Alkaline Phosphatase 83        EKG   EKG reviewed   Labs:   Labs have been reviewed  Results     Procedure Component Value Units Date/Time    D-Dimer [[id number]]  (Abnormal) Collected:  07/20/17 1951     Updated:  07/20/17 2009     D-Dimer 0.73 (H) ug/mL FEU     B-type Natriuretic Peptide [[id number]] Collected:  07/20/17 1906     Updated:  07/20/17 1938     B-Natriuretic Peptide 37.6 pg/mL     CBC and differential [[id number]]  (  Abnormal) Collected:  07/20/17 1906     Updated:  07/20/17 1913     WBC 10.23 x10 3/uL      Hgb 11.2 (L) g/dL      Hematocrit 16.1 (L) %      Platelets 321 x10 3/uL      RBC 4.42 x10 6/uL      MCV 81.9 fL      MCH 25.3 (L) pg      MCHC 30.9 (L) g/dL      RDW 20 (H) %      MPV 9.1 (L) fL      Neutrophils 79.1 %      Lymphocytes Automated 12.3 %      Monocytes 5.8 %      Eosinophils Automated 1.1 %      Basophils Automated 0.4 %      Immature Granulocyte 1.3 %      Nucleated RBC 0.0 /100 WBC      Neutrophils Absolute 8.10 x10 3/uL      Abs Lymph Automated 1.26 x10 3/uL      Abs Mono Automated 0.59 x10 3/uL      Abs Eos Automated 0.11 x10 3/uL      Absolute Baso Automated 0.04 x10 3/uL      Absolute Immature Granulocyte 0.13 (H) x10 3/uL      Absolute NRBC 0.00 x10 3/uL     Troponin I [[id number]] Collected:  07/20/17 1711    Specimen:  Blood Updated:  07/20/17 1745     Troponin I <0.01 ng/mL     Comprehensive metabolic panel [[id number]]  (Abnormal) Collected:  07/20/17 1711    Specimen:  Blood Updated:  07/20/17 1741     Glucose 200 (H) mg/dL      BUN 95.6 mg/dL      Creatinine 0.8 mg/dL      Sodium 213 mEq/L      Potassium 5.8 (H) mEq/L      Chloride 100 mEq/L      CO2 24 mEq/L      Calcium 9.5 mg/dL      Protein, Total 7.6 g/dL      Albumin 3.5  g/dL      AST (SGOT) 35 (H) U/L      ALT 16 U/L      Alkaline Phosphatase 83 U/L      Bilirubin, Total 0.3 mg/dL      Globulin 4.1 (H) g/dL      Albumin/Globulin Ratio 0.9     Anion Gap 17.0 (H)    GFR [[id number]] Collected:  07/20/17 1711     Updated:  07/20/17 1741     EGFR >60.0        Rads:   Radiology reports have been reviewed.  Radiology Results (24 Hour)     Procedure Component Value Units Date/Time    CT Angio Chest [[id number]] Collected:  07/20/17 2147    Order Status:  Completed Updated:  07/20/17 2153    Narrative:       CT angiogram of the chest with intravenous contrast    CLINICAL INFORMATION: Shortness of breath. Evaluate for pulmonary  embolus.    PROCEDURE: CT angiogram of the chest was performed with intravenous  contrast. Nonionic contrast was utilized. Maximal intensity projection  reconstructed images were generated and reviewed. 100 cc of Visipaque  320 was utilized for intravenous contrast.    FINDINGS:    An approximately 9 mm low-attenuation lesion is noted within the caudate  lobe of the liver likely a cyst. There is no evidence of pulmonary  embolus. Minimal apparent diffuse groundglass opacities noted within the  lungs, nonspecific. No pleural or pericardial effusion is seen. There is  no axillary hilar or mediastinal lymphadenopathy.      Impression:        No evidence pulmonary embolus. Nonspecific mild diffuse  groundglass opacity.    Rocky Crafts, MD   07/20/2017 9:49 PM    XR Chest  AP Portable [[id number]] Collected:  07/20/17 1947    Order Status:  Completed Updated:  07/20/17 1954    Narrative:       Chest single view    CLINICAL INFORMATION: Shortness of breath.    FINDINGS: Comparison is made to previous study dated 07/20/2017.    The cardiomediastinal silhouette is stable. The lungs are clear. There  are no definite effusions.      Impression:        No acute disease.    Rocky Crafts, MD   07/20/2017 7:50 PM              Jackquline Bosch MD FACP  [facility] Hospitalist    Pager Number: [phone]

## 2017-07-21 NOTE — Plan of Care (Signed)
Problem: Safety  Goal: Patient will be free from injury during hospitalization  Outcome: Progressing   07/21/17 0436   Goal/Interventions addressed this shift   Patient will be free from injury during hospitalization  Assess patient's risk for falls and implement fall prevention plan of care per policy;Provide and maintain safe environment;Use appropriate transfer methods;Ensure appropriate safety devices are available at the bedside;Include patient/ family/ care giver in decisions related to safety;Hourly rounding       Problem: Pain  Goal: Pain at adequate level as identified by patient  Outcome: Progressing   07/21/17 0436   Goal/Interventions addressed this shift   Pain at adequate level as identified by patient Identify patient comfort function goal;Assess for risk of opioid induced respiratory depression, including snoring/sleep apnea. Alert healthcare team of risk factors identified.;Assess pain on admission, during daily assessment and/or before any "as needed" intervention(s);Reassess pain within 30-60 minutes of any procedure/intervention, per Pain Assessment, Intervention, Reassessment (AIR) Cycle;Evaluate if patient comfort function goal is met;Offer non-pharmacological pain management interventions;Evaluate patient's satisfaction with pain management progress       Problem: Moderate/High Fall Risk Score >5  Goal: Patient will remain free of falls  Outcome: Progressing   07/21/17 0400   OTHER   High (Greater than 13) HIGH-Activate bed/chair exit alarm where available;HIGH-Apply yellow "Fall Risk" arm band;HIGH-Initiate use of floor mats as appropriate;HIGH-Consider use of low bed

## 2017-07-22 ENCOUNTER — Observation Stay: Payer: No Typology Code available for payment source

## 2017-07-22 DIAGNOSIS — K5900 Constipation, unspecified: Secondary | ICD-10-CM

## 2017-07-22 LAB — ECG 12-LEAD
Atrial Rate: 88 {beats}/min
P Axis: 47 degrees
P-R Interval: 130 ms
Q-T Interval: 340 ms
QRS Duration: 68 ms
QTC Calculation (Bezet): 411 ms
R Axis: 47 degrees
T Axis: 62 degrees
Ventricular Rate: 88 {beats}/min

## 2017-07-22 LAB — GLUCOSE WHOLE BLOOD - POCT
Whole Blood Glucose POCT: 135 mg/dL — ABNORMAL HIGH (ref 70–100)
Whole Blood Glucose POCT: 188 mg/dL — ABNORMAL HIGH (ref 70–100)
Whole Blood Glucose POCT: 285 mg/dL — ABNORMAL HIGH (ref 70–100)

## 2017-07-22 MED ORDER — DSS 100 MG PO CAPS
100.0000 mg | ORAL_CAPSULE | Freq: Two times a day (BID) | ORAL | 0 refills | Status: DC
Start: 2017-07-22 — End: 2018-01-26

## 2017-07-22 MED ORDER — PREDNISONE 20 MG PO TABS
40.0000 mg | ORAL_TABLET | Freq: Every morning | ORAL | 0 refills | Status: DC
Start: 2017-07-23 — End: 2018-04-20

## 2017-07-22 NOTE — Progress Notes (Signed)
Per Home Oxygen saturation below; patient does not qualify for home oxygen.      PULSE OXIMETRY TESTING: (For Home Oxygen)    Document patient's oxygen at rest on room air:     ___91__% on room air, at rest (NO Ranges please)    ___97__% on Oxygen, at rest at _2___LPM via Nasal Canula      IF patient's saturation is 88% OR BELOW on room air, STOP Here,  IF NOT ambulate patient on room air with exertion and document below:    ___90__% on Room Air, with exertion (must be 88% or below)    _____% on Oxygen, with exertion, at ____LPM via NC    All three(3) tests must be during the same session.    RN:  Please copy and paste above template and document O2 saturations in a PROGRESS NOTE.    Testing to qualify for home Oxygen must be no earlier than 48 hours prior to discharge, or it will need to be repeated.    Please call the home health care liaison at [phone] when completed.  Thanks.

## 2017-07-22 NOTE — Plan of Care (Signed)
Problem: Safety  Goal: Patient will be free from injury during hospitalization  Outcome: Progressing   07/22/17 0322   Goal/Interventions addressed this shift   Patient will be free from injury during hospitalization  Assess patient's risk for falls and implement fall prevention plan of care per policy;Use appropriate transfer methods;Hourly rounding;Include patient/ family/ care giver in decisions related to safety;Ensure appropriate safety devices are available at the bedside;Provide and maintain safe environment       Problem: Psychosocial and Spiritual Needs  Goal: Demonstrates ability to cope with hospitalization/illness  Outcome: Progressing   07/22/17 0322   Goal/Interventions addressed this shift   Demonstrates ability to cope with hospitalizations/illness Encourage verbalization of feelings/concerns/expectations;Provide quiet environment;Encourage patient to set small goals for self;Assist patient to identify own strengths and abilities;Encourage participation in diversional activity;Reinforce positive adaptation of new coping behaviors;Include patient/ patient care companion in decisions

## 2017-07-22 NOTE — Final Progress Note (DC Note for stay less than 48 (Signed)
Date:07/22/2017   Patient Name: Rachael [name]  Attending Physician: Judson Roch, MD  Today:   BP 135/80   Pulse (!) 102   Temp 98.8 F (37.1 C)   Resp 18   Ht 1.473 m (4\' 10" )   Wt 84.4 kg (186 lb)   SpO2 91%   BMI 38.87 kg/m   Ranges for the last 24 hours:  Temp:  [97.7 F (36.5 C)-99 F (37.2 C)] 98.8 F (37.1 C)  Heart Rate:  [98-111] 102  Resp Rate:  [18-20] 18  BP: (135-159)/(76-95) 135/80    Date of Admission:   07/20/2017    Date of Discharge:   07/22/2017    Outcome of Hospitalization:   Active Problems:    DOE (dyspnea on exertion)  Resolved Problems:    * No resolved hospital problems. *     Significant Events: 81 year old lady with a past medical history of hypertension, hyperlipidemia, hypothyroidism, polymyalgia rheumatica, sleep apnea, admitted for the evaluation of exertional shortness of breath ongoing for over a month.  Patient's shortness of breath is progressive in nature, not associated with chest pain.  No dizziness or lightheadedness.  No cough or sputum production.  No nausea, vomiting.  She does complain of constipation over the last week with minimal relief.  No fevers or chills.  Patient was admitted to the hospital in the month of March this year.  As per the daughter, patient's oxygen saturation has been dropping.     Patient was brought to ED and was started on supplemental O2 and placed in observation status. Patient started on PO prednisone and duoneb prn for acute Dysnea on exertion and hypoxia. Patient had PFT done which is evident for severe restrictive defect with FEV1/FVC ratio normal at 75%. With continued PO prednisone and nebs treatment, patient's symptoms improved significantly and supplemental O2 was titrated off.    Lab Results last 48 Hours     Procedure Component Value Units Date/Time    Glucose Whole Blood - POCT [[id number]]  (Abnormal) Collected:  07/22/17 1142     Updated:  07/22/17 1148     POCT - Glucose Whole blood 188 (H) mg/dL     Glucose Whole  Blood - POCT [[id number]]  (Abnormal) Collected:  07/22/17 0752     Updated:  07/22/17 0804     POCT - Glucose Whole blood 135 (H) mg/dL     Glucose Whole Blood - POCT [[id number]]  (Abnormal) Collected:  07/21/17 2209     Updated:  07/21/17 2214     POCT - Glucose Whole blood 298 (H) mg/dL     Glucose Whole Blood - POCT [[id number]]  (Abnormal) Collected:  07/21/17 1635     Updated:  07/21/17 1650     POCT - Glucose Whole blood 189 (H) mg/dL     Glucose Whole Blood - POCT [[id number]]  (Abnormal) Collected:  07/21/17 1127     Updated:  07/21/17 1140     POCT - Glucose Whole blood 107 (H) mg/dL     Ferritin [[id number]] Collected:  07/21/17 0659     Updated:  07/21/17 1034     Ferritin 16.49 ng/mL     Iron [[id number]] Collected:  07/21/17 0659     Updated:  07/21/17 1024     Iron 57 ug/dL     Hemolysis index [[id number]] Collected:  07/21/17 0659     Updated:  07/21/17 1024     Hemolysis Index 7    Glucose Whole Blood - POCT [  [id number]]  (Abnormal) Collected:  07/21/17 0802     Updated:  07/21/17 0805     POCT - Glucose Whole blood 117 (H) mg/dL     Basic Metabolic Panel [[id number]]  (Abnormal) Collected:  07/21/17 0659    Specimen:  Blood Updated:  07/21/17 0753     Glucose 120 (H) mg/dL      BUN 78.2 mg/dL      Creatinine 0.8 mg/dL      Calcium 9.1 mg/dL      Sodium 956 mEq/L      Potassium 3.6 mEq/L      Chloride 102 mEq/L      CO2 31 (H) mEq/L      Anion Gap 11.0    GFR [[id number]] Collected:  07/21/17 0659     Updated:  07/21/17 0753     EGFR >60.0    CBC without differential [[id number]]  (Abnormal) Collected:  07/21/17 0659     Updated:  07/21/17 0733     WBC 9.96 x10 3/uL      Hgb 11.5 (L) g/dL      Hematocrit 41.3 %      Platelets 386 x10 3/uL      RBC 4.64 x10 6/uL      MCV 82.3 fL      MCH 24.8 (L) pg      MCHC 30.1 (L) g/dL      RDW 20 (H) %      MPV 8.8 (L) fL      Nucleated RBC 0.0 /100 WBC      Absolute NRBC 0.00 x10 3/uL     D-Dimer [[id number]]  (Abnormal) Collected:  07/20/17 1951     Updated:  07/20/17  2009     D-Dimer 0.73 (H) ug/mL FEU     B-type Natriuretic Peptide [[id number]] Collected:  07/20/17 1906     Updated:  07/20/17 1938     B-Natriuretic Peptide 37.6 pg/mL     CBC and differential [[id number]]  (Abnormal) Collected:  07/20/17 1906     Updated:  07/20/17 1913     WBC 10.23 x10 3/uL      Hgb 11.2 (L) g/dL      Hematocrit 75.6 (L) %      Platelets 321 x10 3/uL      RBC 4.42 x10 6/uL      MCV 81.9 fL      MCH 25.3 (L) pg      MCHC 30.9 (L) g/dL      RDW 20 (H) %      MPV 9.1 (L) fL      Neutrophils 79.1 %      Lymphocytes Automated 12.3 %      Monocytes 5.8 %      Eosinophils Automated 1.1 %      Basophils Automated 0.4 %      Immature Granulocyte 1.3 %      Nucleated RBC 0.0 /100 WBC      Neutrophils Absolute 8.10 x10 3/uL      Abs Lymph Automated 1.26 x10 3/uL      Abs Mono Automated 0.59 x10 3/uL      Abs Eos Automated 0.11 x10 3/uL      Absolute Baso Automated 0.04 x10 3/uL      Absolute Immature Granulocyte 0.13 (H) x10 3/uL      Absolute NRBC 0.00 x10 3/uL     Troponin I [[id number]] Collected:  07/20/17 1711    Specimen:  Blood Updated:  07/20/17 1745     Troponin I <0.01 ng/mL     Comprehensive metabolic panel [[id number]]  (Abnormal) Collected:  07/20/17 1711    Specimen:  Blood Updated:  07/20/17 1741     Glucose 200 (H) mg/dL      BUN 40.9 mg/dL      Creatinine 0.8 mg/dL      Sodium 811 mEq/L      Potassium 5.8 (H) mEq/L      Chloride 100 mEq/L      CO2 24 mEq/L      Calcium 9.5 mg/dL      Protein, Total 7.6 g/dL      Albumin 3.5 g/dL      AST (SGOT) 35 (H) U/L      ALT 16 U/L      Alkaline Phosphatase 83 U/L      Bilirubin, Total 0.3 mg/dL      Globulin 4.1 (H) g/dL      Albumin/Globulin Ratio 0.9     Anion Gap 17.0 (H)    GFR [[id number]] Collected:  07/20/17 1711     Updated:  07/20/17 1741     EGFR >60.0          Procedures performed:   Radiology: all results from this admission  Xr Abdomen Ap    Result Date: 07/22/2017   Nonspecific bowel gas pattern. Darra Lis, MD 07/22/2017 8:09  AM    Ct Angio Chest    Result Date: 07/20/2017   No evidence pulmonary embolus. Nonspecific mild diffuse groundglass opacity. Rocky Crafts, MD 07/20/2017 9:49 PM    Xr Chest  Ap Portable    Result Date: 07/20/2017   No acute disease. Rocky Crafts, MD 07/20/2017 7:50 PM      Treatment Team:   Attending Provider: Judson Roch, MD    Disposition:   Disposition: Skilled Nursing Facility    Condition at Discharge:   stable     Unresulted Labs     None          Discharge Instructions:     Follow-up Information     Behiri, Amr H, DO Follow up in 1 week(s).    Specialty:  Internal Medicine  Contact information:  [address] [place] [address]             Tyna Jaksch, MD Follow up in 1 week(s).    Specialties:  Pulmonary Disease, Critical Care Medicine, Sleep Medicine, Internal Medicine  Contact information:  [address]  [place] [address]  [phone]                 Discharge References/Attachments    None            Discharge Medication List      Taking    albuterol-ipratropium 2.5-0.5(3) mg/3 mL nebulizer  Dose:  3 mL  Commonly known as:  DUO-NEB  Take 3 mLs by nebulization 4 (four) times daily as needed.     aspirin 81 MG chewable tablet  Dose:  81 mg  Chew 1 tablet (81 mg total) by mouth daily.     celecoxib 200 MG capsule  Dose:  200 mg  Commonly known as:  CeleBREX  Take 200 mg by mouth daily.     cetirizine 10 MG tablet  Dose:  10 mg  Commonly known as:  ZyrTEC  Take 10 mg by mouth daily.     dilTIAZem 300 MG 24 hr capsule  Dose:  300 mg  Commonly known as:  CARDIZEM CD  Take 1 capsule (300 mg total) by mouth daily.     docusate sodium 100 MG capsule  Dose:  100 mg  Commonly known as:  COLACE  Take 1 capsule (100 mg total) by mouth every 12 (twelve) hours.     DULoxetine 60 MG capsule  Dose:  60 mg  Commonly known as:  CYMBALTA  Take 60 mg by mouth 2 (two) times daily.     furosemide 40 MG tablet  Dose:  40 mg  Commonly known as:  LASIX  Take 40 mg by mouth daily.     gabapentin 300  MG capsule  Dose:  900 mg  Commonly known as:  NEURONTIN  Take 900 mg by mouth 2 (two) times daily.     guaiFENesin 600 MG 12 hr tablet  Dose:  600 mg  What changed:  when to take this  Commonly known as:  MUCINEX  Take 1 tablet (600 mg total) by mouth every 12 (twelve) hours.     hydrALAZINE 25 MG tablet  Dose:  1 tablet  Commonly known as:  APRESOLINE  Take 1 tablet by mouth 2 (two) times daily.     HYDROcodone-acetaminophen 5-325 MG per tablet  Dose:  1 tablet  Commonly known as:  NORCO  Take 1 tablet by mouth every 4 (four) hours as needed for Pain.     * insulin glargine 100 UNIT/ML injection  Dose:  18 Units  Commonly known as:  LANTUS  Inject 18 Units into the skin nightly.     * insulin glargine 100 UNIT/ML injection  Dose:  20 Units  Commonly known as:  LANTUS  Inject 20 Units into the skin every morning.     insulin lispro 100 UNIT/ML injection  Dose:  1-8 Units  What changed:   how much to take   when to take this  Commonly known as:  HumaLOG  Inject 1-8 Units into the skin 3 (three) times daily before meals as needed (High blood glucose. See administration instructions.).     lactobacillus acidophilus & bulgar chewable tablet  Dose:  1 tablet  Chew 1 tablet by mouth 2 (two) times daily.     lactulose 10 GM/15ML solution  Dose:  10 g  Commonly known as:  CHRONULAC  Take 10 g by mouth 2 (two) times daily.     levothyroxine 50 MCG tablet  Dose:  50 mcg  Commonly known as:  SYNTHROID, LEVOTHROID  Take 50 mcg by mouth Once a day at 6:00am.     lidocaine 5 %  Dose:  1 patch  Commonly known as:  LIDODERM  Place 1 patch onto the skin daily.Remove & Discard patch within 12 hours or as directed by MD     LIVALO 2 MG Tabs  Dose:  1 tablet  Generic drug:  Pitavastatin Calcium  Take 1 tablet by mouth nightly.     loperamide 2 MG capsule  Dose:  2 mg  Commonly known as:  IMODIUM  Take 2 mg by mouth as needed for Diarrhea.     loteprednol 0.2 % Susp  Dose:  1 drop  Commonly known as:  LOTEMAX  Place 1 drop into both  eyes 4 (four) times daily.     metFORMIN 750 MG 24 hr tablet  Dose:  750 mg  Commonly known as:  GLUCOPHAGE-XR  Take 750 mg by mouth nightly.     metoprolol  succinate 100 MG 24 hr tablet  Dose:  100 mg  Commonly known as:  TOPROL-XL  Take 100 mg by mouth daily.     naloxone 4 MG/0.1ML nasal spray  Dose:  1 spray  Commonly known as:  NARCAN  1 spray by Nasal route once as needed.For signs of opioid overdose.     pantoprazole 40 MG tablet  Dose:  40 mg  Commonly known as:  PROTONIX  Take 40 mg by mouth daily.     potassium chloride 10 MEQ tablet  Dose:  20 mEq  Commonly known as:  K-TAB,KLOR-CON  Take 20 mEq by mouth daily.     * predniSONE 5 MG tablet  Dose:  7 mg  What changed:  Another medication with the same name was added. Make sure you understand how and when to take each.  Commonly known as:  DELTASONE  Take 7 mg by mouth daily.     * predniSONE 20 MG tablet  Dose:  40 mg  What changed:  You were already taking a medication with the same name, and this prescription was added. Make sure you understand how and when to take each.  Commonly known as:  DELTASONE  Take 2 tablets (40 mg total) by mouth every morning with breakfast.     raNITIdine 150 MG tablet  Dose:  150 mg  Commonly known as:  ZANTAC  Take 150 mg by mouth 2 (two) times daily.     senna 8.6 MG tablet  Dose:  2 tablet  Commonly known as:  SENOKOT  Take 2 tablets by mouth 2 (two) times daily.     SYMBICORT 160-4.5 MCG/ACT inhaler  Dose:  2 puff  Generic drug:  budesonide-formoterol  Inhale 2 puffs into the lungs 2 (two) times daily.     SYSTANE ULTRA OP  Dose:  1 drop  Apply 1 drop to eye 4 (four) times daily.     tiotropium 18 MCG inhalation capsule  Dose:  18 mcg  Commonly known as:  SPIRIVA  Place 18 mcg into inhaler and inhale daily.     traZODone 100 MG tablet  Dose:  50 mg  Commonly known as:  DESYREL  Take 50 mg by mouth nightly.        * This list has 4 medication(s) that are the same as other medications prescribed for you. Read the  directions carefully, and ask your doctor or other care provider to review them with you.                Signed by: Judson Roch, MD

## 2017-07-22 NOTE — Progress Notes (Signed)
PULSE OXIMETRY TESTING: (For Home Oxygen)    Document patient's oxygen at rest on room air:     ___91__% on room air, at rest (NO Ranges please)    ___97__% on Oxygen, at rest at _2___LPM via Nasal Canula      IF patient's saturation is 88% OR BELOW on room air, STOP Here,  IF NOT ambulate patient on room air with exertion and document below:    ___90__% on Room Air, with exertion (must be 88% or below)    _____% on Oxygen, with exertion, at ____LPM via NC    All three(3) tests must be during the same session.    RN:  Please copy and paste above template and document O2 saturations in a PROGRESS NOTE.    Testing to qualify for home Oxygen must be no earlier than 48 hours prior to discharge, or it will need to be repeated.    Please call the home health care liaison at [phone] when completed.  Thanks.

## 2017-07-22 NOTE — Progress Notes (Signed)
Ask3Teach3 Program    Education about New Medications and their Side effects    Dear Rachael [name],    Its been a pleasure taking care of you during your hospitalization here at [facility]. We have initiated a new program to educate our patients and/or their family members or designated personnel about the new medications started by your physicians and their indications along with the possible side effects. Multiple studies have shown that patients started on new medications are often unaware of the names of the medication along with the indications and their side effects which leads to decreased compliance with the medications.    During our conversation today on 07/22/2017  3:27 PM I have explained to you the name of the new medication and the indication along with some possible common side effects. Listed below are some of the new medications started during this hospitalization.     Please call the Nurse if you have any side effects while in hospital.     Please call 911 if you have any life threatening symptoms after you are discharged from the hospital.    Please inform your Primary care physician for common side effects which are not life threatening after discharge.    Medication Name: Docusate(Colace)   This Medication is used for:   Constipation    Common Side Effects are:   Abnormal taste in mouth   Nausea   Diarrhea    A note from your nurse:  Call your nurse immediately if you notice itching, hives, swelling or trouble breathing       Medication: Prednisone(Deltasone)   This Medication is used for:   Allergies   COPD   Asthma   Autoimmune conditions    Common Side Effects are:   High Blood sugars   High Blood pressure   Agitation   Upset stomach    A note from your nurse:  Call your nurse immediately if you notice itching, hives, swelling or trouble breathing       Thank you for your time.    Rachael Kern, RN  07/22/2017  3:27 PM  [facility]  [address]  [place], [place]  [address]

## 2017-07-22 NOTE — Plan of Care (Signed)
Problem: Safety  Goal: Patient will be free from injury during hospitalization  Outcome: Progressing   07/22/17 0322   Goal/Interventions addressed this shift   Patient will be free from injury during hospitalization  Assess patient's risk for falls and implement fall prevention plan of care per policy;Use appropriate transfer methods;Hourly rounding;Include patient/ family/ care giver in decisions related to safety;Ensure appropriate safety devices are available at the bedside;Provide and maintain safe environment   Call bell at bedside. Belongings are within reach. Calls are answer promptly. Hourly rounds done. Will continue to monitor.       Problem: Pain  Goal: Pain at adequate level as identified by patient  Outcome: Progressing   07/21/17 0436   Goal/Interventions addressed this shift   Pain at adequate level as identified by patient Identify patient comfort function goal;Assess for risk of opioid induced respiratory depression, including snoring/sleep apnea. Alert healthcare team of risk factors identified.;Assess pain on admission, during daily assessment and/or before any "as needed" intervention(s);Reassess pain within 30-60 minutes of any procedure/intervention, per Pain Assessment, Intervention, Reassessment (AIR) Cycle;Evaluate if patient comfort function goal is met;Offer non-pharmacological pain management interventions;Evaluate patient's satisfaction with pain management progress   Patient c/o back pain. Pain meds given PRN. Include patient and family in pain management plan. Will continue to assess patient's pain during hourly rounding.

## 2017-07-22 NOTE — Procedures (Signed)
Service Date: 07/21/2017     Patient Type: V     PHYSICIAN/PROVIDER: Tyna Jaksch MD     REFERRING PHYSICIAN:      STUDY TITLE:  Pulmonary function testing.     REQUESTING PHYSICIAN:  Mihaela A. Tipler, MD.     INDICATION:  Shortness of breath.     FINDINGS:  Forced vital capacity is 0.73 liters, that is 37% predicted.  FEV1 is 0.55  liters, that is 38% predicted.  FEV1/FVC ratio is normal at 75%.  No  bronchodilator treatment was given.     IMPRESSION:  Suboptimal study, study suggestive of severe restrictive defect.  Clinical  correlation is recommended.           D:  07/21/2017 13:18 PM by Dr. Tyna Jaksch, MD ([id number])  T:  07/22/2017 03:09 AM by NTS      (Conf: [id number]) (Doc ID: [id number])

## 2017-07-22 NOTE — Progress Notes (Signed)
PULSE OXIMETRY TESTING: (For Home Oxygen)    Document patient's oxygen at rest on room air:     _____% on room air, at rest (NO Ranges please)    _____% on Oxygen, at rest at ____LPM via Nasal Canula      IF patient's saturation is 88% OR BELOW on room air, STOP Here,  IF NOT ambulate patient on room air with exertion and document below:    _____% on Room Air, with exertion (must be 88% or below)    _____% on Oxygen, with exertion, at ____LPM via NC    All three(3) tests must be during the same session.    RN:  Please copy and paste above template and document O2 saturations in a PROGRESS NOTE.    Testing to qualify for home Oxygen must be no earlier than 48 hours prior to discharge, or it will need to be repeated.    Please call the home health care liaison at [phone] when completed.  Thanks.

## 2017-07-22 NOTE — Progress Notes (Signed)
Saline Locked removed. Telemetry removed. Patient was given discharge instruction. Patient was given a copy of discharge instruction. Patient is going home and was picked up by daughter. Patient is discharged.

## 2017-07-22 NOTE — Discharge Instr - AVS First Page (Signed)
Reason for your Hospital Admission:  Dyspnea on exertion due to Restrictive lung disease

## 2017-08-18 ENCOUNTER — Ambulatory Visit: Payer: No Typology Code available for payment source | Admitting: Plastic Surgery

## 2017-08-23 ENCOUNTER — Ambulatory Visit: Payer: No Typology Code available for payment source

## 2017-12-16 ENCOUNTER — Ambulatory Visit (INDEPENDENT_AMBULATORY_CARE_PROVIDER_SITE_OTHER): Payer: No Typology Code available for payment source | Admitting: Surgery

## 2018-01-26 ENCOUNTER — Ambulatory Visit
Admission: RE | Admit: 2018-01-26 | Discharge: 2018-01-26 | Disposition: A | Discharge status: Home / Self Care | Payer: No Typology Code available for payment source | Source: Ambulatory Visit | Attending: Internal Medicine | Admitting: Internal Medicine

## 2018-01-26 ENCOUNTER — Encounter
Admission: RE | Disposition: A | Discharge status: Home / Self Care | Payer: Self-pay | Source: Ambulatory Visit | Attending: Internal Medicine

## 2018-01-26 DIAGNOSIS — I2510 Atherosclerotic heart disease of native coronary artery without angina pectoris: Secondary | ICD-10-CM | POA: Insufficient documentation

## 2018-01-26 DIAGNOSIS — Z7952 Long term (current) use of systemic steroids: Secondary | ICD-10-CM | POA: Insufficient documentation

## 2018-01-26 DIAGNOSIS — I350 Nonrheumatic aortic (valve) stenosis: Secondary | ICD-10-CM | POA: Insufficient documentation

## 2018-01-26 DIAGNOSIS — E669 Obesity, unspecified: Secondary | ICD-10-CM | POA: Insufficient documentation

## 2018-01-26 DIAGNOSIS — Z8249 Family history of ischemic heart disease and other diseases of the circulatory system: Secondary | ICD-10-CM | POA: Insufficient documentation

## 2018-01-26 DIAGNOSIS — Z7951 Long term (current) use of inhaled steroids: Secondary | ICD-10-CM | POA: Insufficient documentation

## 2018-01-26 DIAGNOSIS — Z7982 Long term (current) use of aspirin: Secondary | ICD-10-CM | POA: Insufficient documentation

## 2018-01-26 DIAGNOSIS — I272 Pulmonary hypertension, unspecified: Secondary | ICD-10-CM | POA: Insufficient documentation

## 2018-01-26 DIAGNOSIS — Z794 Long term (current) use of insulin: Secondary | ICD-10-CM | POA: Insufficient documentation

## 2018-01-26 DIAGNOSIS — R0609 Other forms of dyspnea: Secondary | ICD-10-CM | POA: Diagnosis present

## 2018-01-26 DIAGNOSIS — I10 Essential (primary) hypertension: Secondary | ICD-10-CM | POA: Insufficient documentation

## 2018-01-26 DIAGNOSIS — G4730 Sleep apnea, unspecified: Secondary | ICD-10-CM | POA: Insufficient documentation

## 2018-01-26 DIAGNOSIS — E079 Disorder of thyroid, unspecified: Secondary | ICD-10-CM | POA: Insufficient documentation

## 2018-01-26 DIAGNOSIS — E119 Type 2 diabetes mellitus without complications: Secondary | ICD-10-CM | POA: Insufficient documentation

## 2018-01-26 DIAGNOSIS — Z87891 Personal history of nicotine dependence: Secondary | ICD-10-CM | POA: Insufficient documentation

## 2018-01-26 DIAGNOSIS — Z6841 Body Mass Index (BMI) 40.0 and over, adult: Secondary | ICD-10-CM | POA: Insufficient documentation

## 2018-01-26 DIAGNOSIS — E7800 Pure hypercholesterolemia, unspecified: Secondary | ICD-10-CM | POA: Insufficient documentation

## 2018-01-26 SURGERY — RIGHT & LEFT HEART CATH  W/ CORONARY ANGIOS, LV/LA
Laterality: Bilateral

## 2018-01-26 MED ORDER — HEPARIN SODIUM (PORCINE) 1000 UNIT/ML IJ SOLN
INTRAMUSCULAR | Status: AC
Start: 2018-01-26 — End: 2018-01-26
  Administered 2018-01-26: 09:00:00 7500 [IU]
  Filled 2018-01-26: qty 10

## 2018-01-26 MED ORDER — FLUMAZENIL 1 MG/10ML IV SOLN
INTRAVENOUS | Status: DC
Start: 2018-01-26 — End: 2018-01-26
  Filled 2018-01-26: qty 10

## 2018-01-26 MED ORDER — IODIXANOL 320 MG/ML IV SOLN
60.0000 mL | Freq: Once | INTRAVENOUS | Status: AC | PRN
Start: 2018-01-26 — End: 2018-01-26
  Administered 2018-01-26: 10:00:00 60 mL via INTRA_ARTERIAL

## 2018-01-26 MED ORDER — NITROGLYCERIN IN D5W 200-5 MCG/ML-% IV SOLN VIAL
INTRAVENOUS | Status: AC
Start: 2018-01-26 — End: 2018-01-26
  Administered 2018-01-26: 09:00:00 200 ug via INTRA_ARTERIAL
  Filled 2018-01-26: qty 10

## 2018-01-26 MED ORDER — FENTANYL CITRATE (PF) 50 MCG/ML IJ SOLN (WRAP)
INTRAMUSCULAR | Status: AC
Start: 2018-01-26 — End: 2018-01-26
  Administered 2018-01-26: 09:00:00 50 ug via INTRAVENOUS
  Filled 2018-01-26: qty 2

## 2018-01-26 MED ORDER — SODIUM CHLORIDE 0.9 % IV SOLN
INTRAVENOUS | Status: AC
Start: 2018-01-26 — End: 2018-01-26

## 2018-01-26 MED ORDER — HEPARIN SODIUM (PORCINE) 1000 UNIT/ML IJ SOLN
INTRAMUSCULAR | Status: AC
Start: 2018-01-26 — End: 2018-01-26
  Administered 2018-01-26: 09:00:00 4000 [IU] via INTRAVENOUS
  Filled 2018-01-26: qty 10

## 2018-01-26 MED ORDER — VERAPAMIL HCL 2.5 MG/ML IV SOLN
INTRAVENOUS | Status: AC
Start: 2018-01-26 — End: 2018-01-26
  Administered 2018-01-26: 09:00:00 2.5 mg via INTRA_ARTERIAL
  Filled 2018-01-26: qty 2

## 2018-01-26 MED ORDER — NALOXONE HCL 0.4 MG/ML IJ SOLN (WRAP)
INTRAMUSCULAR | Status: DC
Start: 2018-01-26 — End: 2018-01-26
  Filled 2018-01-26: qty 1

## 2018-01-26 MED ORDER — MIDAZOLAM HCL 2 MG/2ML IJ SOLN
INTRAMUSCULAR | Status: AC
Start: 2018-01-26 — End: 2018-01-26
  Administered 2018-01-26: 09:00:00 2 mg via INTRAVENOUS
  Filled 2018-01-26: qty 2

## 2018-01-26 NOTE — Discharge Instructions (Signed)
Radial Artery Access Site Care, Activity and Discharge Instructions    Activity:        1.  Do not lift anything greater than five (5) pounds in weight and no strenuous             activity for 72 hours.         2.  Avoid weight bearing in the affected arm and prevent flexion, extension, and             manipulation of the wrist area for at least 24 hours.         3.  No driving for 48 hours following your procedure.         4.  Ask your doctor when you should return to work.  Usually you can return within             two (2) to three (3) days for desk jobs and within five (5) to seven (7) days for             jobs requiring heavy labor.         5.  Drink 6-8 glasses of water for at least the next two (2) days to help flush your             body of the contrast used during the procedure.      Medications:  1.   If you have been asked to take a blood thinner, please start it as soon as you        have been told to take it and especially if you have had a stent placed.  For        stent patients, be particularly mindful about not skipping or missing any        doses.  The blood thinner is keeping your stent open and it can clot easily if        doses are missed.      Access Site Care:       1.  You may shower 24 hours after your procedure.  Leave the bandage in place and            let the water passively flow over the site.  You may shower daily.        2.  After 48 hours, REMOVE the dressing before or during the shower.  Again, let the            water passively flow over the site, wash gently with mild soap and water using            your hand, then pat the area dry.        3.  Do not submerge access site in water (dishwashing, tub bath, pool, etc.) until           completely healed, usually five (5) days.        4.  Do not rub, pick or scratch the area.      5.  Do not apply creams, powders, lotions, or ointments to the site.        6.  Apply a regular sized Band-aid to the puncture site for five (5)  days.  Change it           daily.        7.  Observe for signs of infection:  Redness, warmth, swelling, drainage, or           temperature greater than 100   degrees F.  If you suspect infection call the doctor           who performed the procedure.     8.  Monitor for bleeding and swelling.  If either occurs, sit or lie down, apply manual          pressure directly over access site for twenty (20) minutes and call the doctor who          performed the procedure.      Normal Observation:     1.  You may feel tenderness at the puncture site.  May take Acetaminophen (Tylenol)          if needed.  May also use ice and elevation for site discomfort.       2.  You may experience some mild bruising.      Call 911 if:       1.  If you are unable to stop the bleeding with manual pressure.  An arterial bleed may          become an emergency if left unattended.       2.  Your fingers/hand/arm has a loss of normal sensation, becomes cold, numb,          painful, or grayish in color.       3.  You experience unrelieved chest pain.      Cardiac Rehab:       1.  If you have had an intervention, please review the Cardiac Rehabilitation Brochure         for information on the program and what you need to do to get started.  Ask your         nurse for the brochure if you have not already received one (Nurse will also add in         the Follow-up section, search Cardiac Rehab, [facility] Cardiac Rehab and         when - 1 week.)                Dr A. Anis @ [phone] or [phone]

## 2018-01-26 NOTE — Progress Notes (Signed)
CATH LAB PROCEDURE HANDOFF REPORT    Date Time: 01/26/18 9:35 AM    INDICATIONS:    dyspnea  PROCEDURE:    Left heart cath, Left and right heart cath  ALLERGIES:    Tolectin [tolmetin]; Lyrica [pregabalin]; and Latex   ACC BLEEDING RISK SCORE       MEDICAL HISTORY:      Past Medical History:   Diagnosis Date   . Anxiety    . Arthritis    . 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 Chronic cholecystitis with calculus 10/31/2015   . Chronic obstructive pulmonary disease    . Gastroesophageal reflux disease    . Hiatal hernia    . Hyperlipidemia    . Hypertension    . Hypothyroidism    . Incontinent of urine 2014    -WEARS DIAPERS-WILL BRING HER OWN   . Low back pain    . Lymphedema of right lower extremity    . PMR (polymyalgia rheumatica) 01/07/2017   . Polymyalgia rheumatica 09/2014   . Sleep apnea     USES CPAP NIGHTLY   . Type 2 diabetes mellitus, controlled    . Type II or unspecified type diabetes mellitus without mention of complication, not stated as uncontrolled    . Venous insufficiency of right leg 2016    also has lymphedema-KEEPS HER LEGS WRAPPED EXCEPT FOR WHEN SHE BATHES      ACCESS:    13F sheath in right radial artery, right basilic vein 13Fr sheath  Hemostasis: TR band, 10 cc of air, venous sheath removed w/ manual hemostasis  Post procedure pulses: palpable in right arm/hand  Visual appearance: clean/dry/intact with good distal pulses   MEDICATIONS:    Versed: 2 mg IV  Fentanyl: 50 mcg IV  Heparin:  4000 units IV  Benadryl: 0 mg IV  Nitro: 200 mcg IA  Loading Dose of: None PO  IV Drips: Aggrastat started at 0  VITALS:    BP: 148/71           HR:81         Rhythm: sinus rhythm                O2 SAT: 94%        PROCEDURE DETAILS:    Outcomes: diagnostic cath                                       Last ACT: N/A    Complications: none    Final Chest Pain Assessment::0/10    Report given to: Recovery RN    See Physicians Op note/ Report for details

## 2018-01-26 NOTE — Progress Notes (Signed)
Discharge instructions reviewed with pt and daughter with verbalized understanding, piv removed without complications, cath sites dressings clean/dry/intact without any swelling or hematomas, pt denies any pain.  Pt taken to private vehicle via wheelchair

## 2018-01-26 NOTE — Progress Notes (Signed)
H&P UPDATE WITH ASA/MALLAMPATTI    Date Time: 01/26/18 8:20 AM    PROCEDURE:    Left heart cath, possible percutaneous coronary intervention, Left and right heart cath, possible percutaneous coronary intervention  INDICATIONS:    dyspnea  H&P:    The history and physical including past medical, family, and social history were reviewed   and there are no significant interval changes from what is currently available in the chart  from prior evaluation. She has no complaints.  She was seen and examined by me prior   to the procedure.   ALLERGIES:    Tolectin [tolmetin]; Lyrica [pregabalin]; and Latex   LABS:      Lab Results   Component Value Date    WBC 9.96 07/21/2017    HGB 11.5 (L) 07/21/2017    HCT 38.2 07/21/2017    PLT 386 07/21/2017    NA 144 07/21/2017    K 3.6 07/21/2017    CL 102 07/21/2017    CO2 31 (H) 07/21/2017    MG 2.3 01/11/2017    BUN 12.2 07/21/2017    CREAT 0.8 07/21/2017    EGFR >60.0 07/21/2017    GLU 120 (H) 07/21/2017     ASA PHYSICAL STATUS    Class 3 - Severe systemic disease, limits normal activity but not incapacitating  MALLAMPATTI AIRWAY CLASSIFICATION    Class III: Soft and hard palate and base of the uvula are visible  ACC BLEEDING RISK SCORE      PLANNED SEDATION:    ( ) NO SEDATION   (x) MODERATE SEDATION   ( ) DEEP SEDATION WITH ANESTHESIA   CONCLUSION:    The risks, benefits and alternatives of the procedure have been discussed in detail and   she has indicated that she understands the procedure, indications, and risks inherent to the   procedure and is amenable to proceeding.  All questions were answered. Informed   consent was signed and verified.      Signed by: Elbert Ewings, MD

## 2018-01-26 NOTE — Op Note (Signed)
Brief RHC/LHC:    1) 20% distal/apical LAD.  2) Patent LCx, ramus and RCA with only LI.  3) LVEF 85%, LVEDP 22 mm Hg. 15 mm Hg gradient across AV.  4) RA 17, RV 46/17, PA 46/29/36, PCWP 26 mm HG.  5) Fick CO 3.08 lit/min, CI 1.7.  6) TD CO 4.5 lit/min, CI 2.5.    Dict # [id number]

## 2018-01-27 LAB — ECG 12-LEAD
Atrial Rate: 80 {beats}/min
P-R Interval: 146 ms
Q-T Interval: 380 ms
QRS Duration: 68 ms
QTC Calculation (Bezet): 438 ms
R Axis: 134 degrees
T Axis: 113 degrees
Ventricular Rate: 80 {beats}/min

## 2018-01-28 ENCOUNTER — Encounter: Payer: Self-pay | Admitting: Internal Medicine

## 2018-02-03 ENCOUNTER — Ambulatory Visit: Payer: No Typology Code available for payment source

## 2018-02-17 ENCOUNTER — Ambulatory Visit: Payer: No Typology Code available for payment source | Attending: Family

## 2018-02-17 DIAGNOSIS — I890 Lymphedema, not elsewhere classified: Secondary | ICD-10-CM | POA: Insufficient documentation

## 2018-02-17 DIAGNOSIS — I872 Venous insufficiency (chronic) (peripheral): Secondary | ICD-10-CM | POA: Insufficient documentation

## 2018-02-25 NOTE — PT Eval Note (Signed)
PHYSICAL THERAPY LOWER EXTREMITY LYMPHEDEMA EVALUATION    Patient: Rachael [name] Referred By: Gita Kudo, NP   DOB: 03/31/1936 Medical Record #: [id number]   Age: 82 y.o. Diagnosis: Lymphedema [I89.0]    Start of Care: 02/17/2018 Treatment Diagnosis: lymphedema; venous insufficiency   Date of Onset: 01/31/2018 Facility Provider #: [id number]   Dates of Certification:   02/17/2018 through 05/19/2018 HICN#:  Medicare Sub. Num: [id number]     Date of Service PT Received On: 02/17/18   Treatment Time Start Time: 1510 to Stop Time: 1630   Time Calculation Time Calculation (min): 80 min   Visit # PT Visit  PT Visit Number: 1   Units Billed PT Evaluation  $ PT Evaluation Moderate Complexity (97162): 1 Procedure           CPT Evaluation Code Justification  CRITERIA JUSTIFICATION DESCRIPTION   Personal factors and/or co-morbidities that impact the plan of care. Factors that affect plan of care include:  Osteoarthritis Chronic pain Dementia Generalized weakness Limited endurance for participation in independent functional activities Limited social support 3 (High Complexity)     Body Systems Examined Impairments noted in:  Musculoskeletal  Cardiovascular  Pulmonary  Integumentary  Lymphatic 4 or more elements (High Complexity)      Clinical Presentation Presentation varies throughout session, days. Evolving (Moderate Complexity)     Clinical Decision Making Standardized Assessment used:  See below for details. Evaluation Code:  Moderate Complexity       Reason for consultation:  Patient is a 82 year-old female referred for lymphedema evaluation of bilateral LEs.  Patient reports onset as 30+ years ago. She participated in an initial episode of care/treatment in 2016 (in this clinic by an alternate therapist) which included intensve CDT with multilayer compression bandage application and eventual transition to [facility] velcro closure calf compression with hybrid liners. These were never replaced and patient did not return to  clinic for interval assessments as anticipated. Since that episode of care, patient reports decline in medical and functional status (decreased medicinal management; decreased mobility and decreased pain management) which necessitated 3 hospitalizations and transition to assisted living (had been residing with daughter). Her last hospitalization was 6 months ago for breathing issues and increased edema. Per daughter's report medical diagnostics continue and her diuretic dosage has been maximized resulting in re-referral to this clinic for treatment. Patient denies cellulitis or wound episode. Her goal is edema management.      Medical History:  HTN; COPD; OA; polymyalgia rheumatica; cataracts; incontinence; DM; difficulty hearing; visual impairments; hx back surgery; hx carpal tunnel release (bilateral); hx hysterectomy; hx sclerotherapy to veins; hx wounds.      Vascular tests:    US Arterial/ Graft Duplex Dopp Low Extrem Bil Comp [VAS03] (Order [id number])   [place] Noninvas Low Extrem Art Dopp/Press/Wavefrms (Pvr) Comp 3-4 Lvls [VAS23] (Order [id number]) performed 06/14/2017    IMPRESSION:      1. Normal resting arterial screening study of the lower extremities.  2. Normal duplex examination of the lower extremities bilaterally.    Lemar Livings, MD   06/14/2017 5:36 PM      US Venous Insufficiency Duplex Doppler Leg Bilateral [VAS16] (Order [id number]) performed 06/14/2017    IMPRESSION:       1. Severe reflux of the left great saphenous vein at the calf and ankle  as described above.  2. Severe reflux of two perforators in the right proximal and mid calf  level.  3. These are likely not clinically significant  findings.    Lemar Livings, MD   06/14/2017 5:34 PM    Social History:    Lives in assisted living at [place]. Daughter is suportive and lives nearby but with increased work demands. Patient has walker for ambulation. She requires CG.       Medications:  alrex drops; amitiza; aspirin; bumetanide; celebrex;  zrytec; cardiziem; cymbalta; gabapentin; guaifenesin; humalog; hydralazine; ydrocodone-acetaminophen; lactinex; lantus; levothyroxine; lidocaine; livalo; metformin; metoporolol; oxycontin; pantoprazole; polyethylene glycol; potassium; prednisone; spiriva; symbicort; systane; trazadone; xanax; biscolax; narcan; ranitidine      Allergies: latex; lyrica; NSAIDs      Pain:  0/10    Pain is attributable to lymphedema: No      Symptoms of claudication:No      Degree, location,  character of edema:  Bilateral LE involvement foot to thighs, right LE greater than left LE.Marland Kitchen Tissue turgor is firm but pitting, including medial thighs. Stemmer's sign is positive.      Limb volumetry:  Deferred this visit.       Girth Measurements:   LLE RLE   MTP 24 24.1   Arch 23.9 24.3   Diagonal heel 34.3 34.8   Narrowest ankle 29.9 33.3   B1 35.5 40.1   Widest calf 46.4 50.5   Below knee 44.2 46.9   Suprapatellar NM NM   Mid thigh 55.8 57.5     Skin condition/palpation:  Rubor noted at lower legs.  Evidence of vein stress. Tissue temperature is warm.       Pulses:  Not obtained.      Sensation: neuropathy with impaired protective sensation       Functional range of motion/mobility/musculoskeletal status: patient presents with mild confusion (?dementia) and wife with decreased health literacy. Patient is 5'0" tall and weights 198 pounds. She requires CG for pejorative amount of tasks and cuing. Function is impacted by edema at BLEs, but also pain; decreased vision; decreased hearing; and decreased cognition.  She is unable to reach her feet adequate enough for compression bandage application nor donning/doffing of garments and will likely rely on paid caregiver assistance.     Intervention today:    1. Evaluation  2. Reintroduction to rehabilitation process and CDT via bandage application for tissue recovery and volume reduction while monitoring response. Family concerned regarding logistics given assisted living environment. Therapist  discussed typical success is best afforded with bandaging as applied by family member then transition to well-fitting and effective custom flat knit day garments and custom night garments as these are more easily handled by staff. Daughter was recalling past efforts with velcro closure and perceived the recommendation would be the same. History confirms that this was not effective necessitating evaluation and treatment at this time. Family and patient further concerned regarding the burden/time investment for bandaging and voiced expectation that the assisted living facility would assume the primary responsibility given financial investment. POC finalization deferred in order to allow daughter to speak with assisted living personnel.     Assessment:  Patient presents with lymphedema secondary to venous insufficiency and other medical conditions. She would best benefit from custom flat knit day compression garments as well custom night garments which could be easily donned/doffed and managed by assisted living staff. First, however, patient and family would need to participate in 5x/week x 2-3 weeks of intensive CDT involving daily clinic visits for compression bandage application in order to decrease volume and recover tissues while monitoring response and training family to provide interim bandage applications. Daughter with expectation  that since assisted living facility receives money for patient's care, they would be the ones to do this, even despite coaching of this therapist to the reality of the converse. Nonetheless they wish to approach the facility to problem solve this issue. Rehab potential is good toward goals below noted with reasonable and timely progress anticipated. Potential barriers include: limited caregiver support; disease process; comorbidities; decreased health literacy; functional impairments; and, unrealistic expectations. Without interventions, however, patient risks further negative  progression of condition, including: increased edema; increased fibrosis; increased functional impairment; wounds; infection; and, hospitalization.     Goals:    1. Patient/ caregiver will verbalize understanding of lymphedema etiology, risk reduction, skin care, role of exercises to aid in decongestion, options/ recommendations for management strategies within 12 visits.   2. Patient will have girth based limb volumetry; pulse assessment; and, protective sensory assessment prior to initiation of treatment to establish accurate baseline.   3. Patient/ caregiver will demonstrate proficiency with safe application of multilayer short stretch compression bandaging (or alternative) within 10-12 visits over 2-3 weeks.   4. Patient will demonstrate objective reduction in edema via (limb volumetry/girth measurements/photography), with stabilization of reduction within 16 visits 12 weeks.   5. Patient will demonstrate ability to manage garments independently with or without assistive devices within 4-6 after receipt and instruction.   6. Discharge goal is for patient/caregiver to be able to maintain optimal control of BLE lymphedema with well fitting, comfortable daytime garments, nighttime garments, sequential compression pump within 12-16 sessions over 12 weeks.  7. Assist patient with procurement of compression garments through outside vendor using available insurance benefits.       Plan:  Up to 5 visits/week x 2-3 weeks followed by up to 8 additional visits throughout the remainder of the episode of care currently established at 12 weeks. Will follow with family and finalize POC next visit.       Therapist signature  Leodis Binet MPT, CLT-LANA      Thank you for this referral.  Please feel free to contact me with any concerns or questions.        MD signature:  ___________________________________     Date: _______________  I certify that this plan of care is medically necessary.

## 2018-03-01 ENCOUNTER — Emergency Department: Payer: No Typology Code available for payment source

## 2018-03-01 ENCOUNTER — Emergency Department
Admission: EM | Admit: 2018-03-01 | Discharge: 2018-03-01 | Disposition: A | Discharge status: Home / Self Care | Payer: No Typology Code available for payment source | Attending: Emergency Medicine | Admitting: Emergency Medicine

## 2018-03-01 DIAGNOSIS — D696 Thrombocytopenia, unspecified: Secondary | ICD-10-CM | POA: Insufficient documentation

## 2018-03-01 DIAGNOSIS — W19XXXA Unspecified fall, initial encounter: Secondary | ICD-10-CM

## 2018-03-01 DIAGNOSIS — K219 Gastro-esophageal reflux disease without esophagitis: Secondary | ICD-10-CM | POA: Insufficient documentation

## 2018-03-01 DIAGNOSIS — W1839XA Other fall on same level, initial encounter: Secondary | ICD-10-CM | POA: Insufficient documentation

## 2018-03-01 DIAGNOSIS — S43015A Anterior dislocation of left humerus, initial encounter: Secondary | ICD-10-CM | POA: Insufficient documentation

## 2018-03-01 DIAGNOSIS — S0083XA Contusion of other part of head, initial encounter: Secondary | ICD-10-CM | POA: Insufficient documentation

## 2018-03-01 DIAGNOSIS — R030 Elevated blood-pressure reading, without diagnosis of hypertension: Secondary | ICD-10-CM

## 2018-03-01 DIAGNOSIS — E039 Hypothyroidism, unspecified: Secondary | ICD-10-CM | POA: Insufficient documentation

## 2018-03-01 DIAGNOSIS — I10 Essential (primary) hypertension: Secondary | ICD-10-CM | POA: Insufficient documentation

## 2018-03-01 DIAGNOSIS — Z79899 Other long term (current) drug therapy: Secondary | ICD-10-CM | POA: Insufficient documentation

## 2018-03-01 DIAGNOSIS — R739 Hyperglycemia, unspecified: Secondary | ICD-10-CM

## 2018-03-01 DIAGNOSIS — J449 Chronic obstructive pulmonary disease, unspecified: Secondary | ICD-10-CM | POA: Insufficient documentation

## 2018-03-01 DIAGNOSIS — E785 Hyperlipidemia, unspecified: Secondary | ICD-10-CM | POA: Insufficient documentation

## 2018-03-01 DIAGNOSIS — Y92002 Bathroom of unspecified non-institutional (private) residence single-family (private) house as the place of occurrence of the external cause: Secondary | ICD-10-CM | POA: Insufficient documentation

## 2018-03-01 DIAGNOSIS — Z7982 Long term (current) use of aspirin: Secondary | ICD-10-CM | POA: Insufficient documentation

## 2018-03-01 DIAGNOSIS — E1165 Type 2 diabetes mellitus with hyperglycemia: Secondary | ICD-10-CM | POA: Insufficient documentation

## 2018-03-01 DIAGNOSIS — Z794 Long term (current) use of insulin: Secondary | ICD-10-CM | POA: Insufficient documentation

## 2018-03-01 DIAGNOSIS — D509 Iron deficiency anemia, unspecified: Secondary | ICD-10-CM | POA: Insufficient documentation

## 2018-03-01 LAB — CELL MORPHOLOGY
Cell Morphology: ABNORMAL — AB
Platelet Estimate: NORMAL

## 2018-03-01 LAB — CBC AND DIFFERENTIAL
Absolute NRBC: 0 10*3/uL (ref 0.00–0.00)
Basophils Absolute Automated: 0.06 10*3/uL (ref 0.00–0.08)
Basophils Automated: 0.5 %
Eosinophils Absolute Automated: 0.08 10*3/uL (ref 0.00–0.44)
Eosinophils Automated: 0.7 %
Hematocrit: 34.4 % — ABNORMAL LOW (ref 34.7–43.7)
Hgb: 9.6 g/dL — ABNORMAL LOW (ref 11.4–14.8)
Immature Granulocytes Absolute: 0.11 10*3/uL — ABNORMAL HIGH (ref 0.00–0.07)
Immature Granulocytes: 1 %
Lymphocytes Absolute Automated: 1.42 10*3/uL (ref 0.42–3.22)
Lymphocytes Automated: 12.3 %
MCH: 21.6 pg — ABNORMAL LOW (ref 25.1–33.5)
MCHC: 27.9 g/dL — ABNORMAL LOW (ref 31.5–35.8)
MCV: 77.3 fL — ABNORMAL LOW (ref 78.0–96.0)
MPV: 9.2 fL (ref 8.9–12.5)
Monocytes Absolute Automated: 0.51 10*3/uL (ref 0.21–0.85)
Monocytes: 4.4 %
Neutrophils Absolute: 9.37 10*3/uL — ABNORMAL HIGH (ref 1.10–6.33)
Neutrophils: 81.1 %
Nucleated RBC: 0 /100 WBC (ref 0.0–0.0)
Platelets: 400 10*3/uL — ABNORMAL HIGH (ref 142–346)
RBC: 4.45 10*6/uL (ref 3.90–5.10)
RDW: 19 % — ABNORMAL HIGH (ref 11–15)
WBC: 11.55 10*3/uL — ABNORMAL HIGH (ref 3.10–9.50)

## 2018-03-01 LAB — ECG 12-LEAD
Atrial Rate: 94 {beats}/min
P Axis: 41 degrees
P-R Interval: 154 ms
Q-T Interval: 328 ms
QRS Duration: 82 ms
QTC Calculation (Bezet): 410 ms
R Axis: 52 degrees
T Axis: 52 degrees
Ventricular Rate: 94 {beats}/min

## 2018-03-01 LAB — COMPREHENSIVE METABOLIC PANEL
ALT: 10 U/L (ref 0–55)
AST (SGOT): 11 U/L (ref 5–34)
Albumin/Globulin Ratio: 1.3 (ref 0.9–2.2)
Albumin: 3.8 g/dL (ref 3.5–5.0)
Alkaline Phosphatase: 80 U/L (ref 37–106)
Anion Gap: 13 (ref 5.0–15.0)
BUN: 14 mg/dL (ref 7.0–19.0)
Bilirubin, Total: 0.3 mg/dL (ref 0.2–1.2)
CO2: 33 mEq/L — ABNORMAL HIGH (ref 22–29)
Calcium: 9.6 mg/dL (ref 7.9–10.2)
Chloride: 98 mEq/L — ABNORMAL LOW (ref 100–111)
Creatinine: 0.9 mg/dL (ref 0.6–1.0)
Globulin: 3 g/dL (ref 2.0–3.6)
Glucose: 157 mg/dL — ABNORMAL HIGH (ref 70–100)
Potassium: 3.5 mEq/L (ref 3.5–5.1)
Protein, Total: 6.8 g/dL (ref 6.0–8.3)
Sodium: 144 mEq/L (ref 136–145)

## 2018-03-01 LAB — GFR: EGFR: >60

## 2018-03-01 LAB — TROPONIN I: Troponin I: <0.01 ng/mL (ref 0.00–0.09)

## 2018-03-01 LAB — MAGNESIUM: Magnesium: 1.7 mg/dL (ref 1.6–2.6)

## 2018-03-01 LAB — CK: Creatine Kinase (CK): 27 U/L — ABNORMAL LOW (ref 29–168)

## 2018-03-01 MED ORDER — LORAZEPAM 2 MG/ML IJ SOLN
2.00 mg | Freq: Once | INTRAMUSCULAR | Status: DC
Start: 2018-03-01 — End: 2018-03-01

## 2018-03-01 MED ORDER — ONDANSETRON HCL 4 MG/2ML IJ SOLN
4.00 mg | Freq: Once | INTRAMUSCULAR | Status: AC
Start: 2018-03-01 — End: 2018-03-01
  Administered 2018-03-01: 09:00:00 4 mg via INTRAVENOUS
  Filled 2018-03-01: qty 2

## 2018-03-01 MED ORDER — FENTANYL CITRATE (PF) 50 MCG/ML IJ SOLN (WRAP)
50.00 ug | Freq: Once | INTRAMUSCULAR | Status: AC
Start: 2018-03-01 — End: 2018-03-01
  Administered 2018-03-01: 08:00:00 50 ug via INTRAVENOUS
  Filled 2018-03-01: qty 2

## 2018-03-01 MED ORDER — ONDANSETRON HCL 4 MG/2ML IJ SOLN
4.00 mg | Freq: Once | INTRAMUSCULAR | Status: AC
Start: 2018-03-01 — End: 2018-03-01
  Administered 2018-03-01: 07:00:00 4 mg via INTRAVENOUS
  Filled 2018-03-01: qty 2

## 2018-03-01 MED ORDER — SODIUM CHLORIDE 0.9 % IV BOLUS
500.00 mL | Freq: Once | INTRAVENOUS | Status: AC
Start: 2018-03-01 — End: 2018-03-01
  Administered 2018-03-01: 07:00:00 500 mL via INTRAVENOUS

## 2018-03-01 MED ORDER — FENTANYL CITRATE (PF) 50 MCG/ML IJ SOLN (WRAP)
75.00 ug | Freq: Once | INTRAMUSCULAR | Status: AC
Start: 2018-03-01 — End: 2018-03-01
  Administered 2018-03-01: 09:00:00 75 ug via INTRAVENOUS
  Filled 2018-03-01: qty 2

## 2018-03-01 MED ORDER — MORPHINE SULFATE 4 MG/ML IJ/IV SOLN (WRAP)
4.0000 mg | Freq: Once | Status: AC
Start: 2018-03-01 — End: 2018-03-01
  Administered 2018-03-01: 07:00:00 4 mg via INTRAVENOUS
  Filled 2018-03-01: qty 1

## 2018-03-01 MED ORDER — PROPOFOL 10 MG/ML IV EMUL (WRAP)
200.00 mg | Freq: Once | INTRAVENOUS | Status: AC
Start: 2018-03-01 — End: 2018-03-01
  Administered 2018-03-01: 12:00:00 200 mg via INTRAVENOUS
  Filled 2018-03-01: qty 20

## 2018-03-01 NOTE — Sedation Documentation (Signed)
During procedure patient's oxygenation was increased to [facility]. Adequate oxygen saturation was maintained. MD at bedside

## 2018-03-01 NOTE — ED Notes (Signed)
Patient placed to room air; patient to continuous pulse oximetry; RN to continue to monitor

## 2018-03-01 NOTE — Progress Notes (Signed)
CM s/w Lea at [facility] and confirmed their ability to care for pnt w left arm sling.  CM explned that pnt will need standby assist w transfers and ambulation.  Pnt is mostly in electric scooter for long distance such as to dining room.  She stated they would be able to accomodate pnt.  CM faxed her AVS and ER H&P to [phone].  Pnt is aware of need to use her call bell at [facility] for assistance.  No other needs identified.  CM will cont to follow as needed.

## 2018-03-01 NOTE — ED Notes (Signed)
Left arm to sling, radial pulses are palpable. Skin warm to touch, patient able to move fingers without difficulty. Patient reports pain to left arm has improved since procedure.

## 2018-03-01 NOTE — ED Notes (Signed)
Patient currently drinking fluids with daughter at the bedside. Patient is alert and oriented.

## 2018-03-01 NOTE — ED Provider Notes (Signed)
Physician/Midlevel provider first contact with patient: 03/01/18 0617         History     Chief Complaint   Patient presents with   . Fall     HPI   Patient has a history of osteoarthritis.  She had a hip injection of cortisone either yesterday and her right hip.  She was in the bathroom this morning and Lost her balance and fell and landed on her left cheek and left shoulder.  She complains of left cheek and left shoulder pain.  She said that her right hip hurt initially but does not hurt anymore.  She thinks her tetanus shot is up-to-date.   No loss of consciousness, no alcohol or drug use, no head, neck, back, chest, abdomen pain or injury.  No numbness, tingling or weakness.  No speech, vision, trouble.  Any movement makes the left shoulder hurt much worse.  Any palpation makes left cheek.  Her pain can get to severe.  She tries to move her left shoulder.      Pt answers that there are no fevers, chills, nausea, vomiting, diarrhea, constipation, r rash.        Past Medical History:   Diagnosis Date   . Anxiety    . Arthritis    . 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 Chronic cholecystitis with calculus 10/31/2015   . Chronic obstructive pulmonary disease    . Gastroesophageal reflux disease    . Hiatal hernia    . Hyperlipidemia    . Hypertension    . Hypothyroidism    . Incontinent of urine 2014    -WEARS DIAPERS-WILL BRING HER OWN   . Low back pain    . Lymphedema of right lower extremity    . PMR (polymyalgia rheumatica) 01/07/2017   . Polymyalgia rheumatica 09/2014   . Sleep apnea     USES CPAP NIGHTLY   . Type 2 diabetes mellitus, controlled    . Type II or unspecified type diabetes mellitus without mention of complication, not stated as uncontrolled    . Venous insufficiency of right leg 2016    also has lymphedema-KEEPS HER LEGS WRAPPED EXCEPT FOR WHEN SHE BATHES       Past Surgical History:   Procedure Laterality Date   . BACK SURGERY  2014   . CARPAL TUNNEL RELEASE Bilateral 1978   .  HYSTERECTOMY  1985   . RIGHT & LEFT HEART CATH  W/ CORONARY ANGIOS, LV/LA Bilateral 01/26/2018    Procedure: Right & Left Heart Cath  w/ Coronary Angios, LV/LA;  Surgeon: Elbert Ewings, MD;  Location: [facility];  Service: Cardiovascular;  Laterality: Bilateral;   . ROBOTIC, CHOLECYSTECTOMY N/A 10/31/2015    Procedure: ROBOT ASSISTED, LAPAROSCOPIC, CHOLECYSTECTOMY, UMBILICAL HERNIA REPAIR;  Surgeon: Alen Blew, MD;  Location:  MAIN OR;  Service: General;  Laterality: N/A;       History reviewed. No pertinent family history.    Social  Social History   Substance Use Topics   . Smoking status: Former Smoker     Packs/day: 1.00     Years: 25.00     Quit date: 11/16/1998   . Smokeless tobacco: Never Used   . Alcohol use 1.8 oz/week     3 Glasses of wine per week      Comment: social       .     Allergies   Allergen Reactions   .  Tolectin [Tolmetin] Swelling   . Lyrica [Pregabalin]    . Latex Itching       Home Medications     Med List Status:  In Progress Set By: Tyler Pita, RN at 03/01/2018  5:58 AM                albuterol-ipratropium (DUO-NEB) 2.5-0.5(3) mg/3 mL nebulizer     Take 3 mLs by nebulization 4 (four) times daily as needed.     ALPRAZolam (XANAX) 0.25 MG tablet     Take 0.25 mg by mouth nightly as needed.     aspirin 81 MG chewable tablet     Chew 1 tablet (81 mg total) by mouth daily.     bisacodyl (BISCOLAX) 10 mg suppository     Place 10 mg rectally daily.     bumetanide (BUMEX) 1 MG tablet     Take 2 mg by mouth 2 (two) times daily         celecoxib (CELEBREX) 200 MG capsule     Take 200 mg by mouth daily.         cetirizine (ZYRTEC) 10 MG tablet     Take 10 mg by mouth daily.     dilTIAZem (CARDIZEM CD) 300 MG 24 hr capsule     Take 1 capsule (300 mg total) by mouth daily.     DULoxetine (CYMBALTA) 60 MG capsule     Take 60 mg by mouth 2 (two) times daily.     gabapentin (NEURONTIN) 300 MG capsule     Take 900 mg by mouth 2 (two) times daily.         guaiFENesin (MUCINEX)  600 MG 12 hr tablet     Take 1 tablet (600 mg total) by mouth every 12 (twelve) hours.     Patient taking differently:  Take 600 mg by mouth 2 (two) times daily.         hydrALAZINE (APRESOLINE) 25 MG tablet     Take 1 tablet by mouth 2 (two) times daily.     HYDROcodone-acetaminophen (NORCO) 5-325 MG per tablet     Take 1 tablet by mouth every 4 (four) hours as needed for Pain.     Patient taking differently:  Take 1 tablet by mouth every 6 (six) hours.         insulin glargine (LANTUS) 100 UNIT/ML injection     Inject 18 Units into the skin nightly.     insulin glargine (LANTUS) 100 UNIT/ML injection     Inject 28 Units into the skin every morning         insulin lispro (HUMALOG) 100 UNIT/ML injection     Inject 1-8 Units into the skin 3 (three) times daily before meals as needed (High blood glucose. See administration instructions.).     Patient taking differently:  Inject 2-10 Units into the skin 4 (four) times daily.         lactobacillus acidophilus & bulgar (LACTINEX) chewable tablet     Chew 1 tablet by mouth 2 (two) times daily.         levothyroxine (SYNTHROID, LEVOTHROID) 50 MCG tablet     Take 50 mcg by mouth Once a day at 6:00am.     lidocaine (LIDODERM) 5 %     Place 1 patch onto the skin daily.Remove & Discard patch within 12 hours or as directed by MD     loperamide (IMODIUM) 2 MG capsule     Take 2  mg by mouth as needed for Diarrhea.     loteprednol (LOTEMAX) 0.2 % Suspension     Place 1 drop into both eyes 4 (four) times daily.         metFORMIN (GLUCOPHAGE-XR) 750 MG 24 hr tablet     Take 750 mg by mouth nightly.     metoprolol succinate (TOPROL-XL) 100 MG 24 hr tablet     Take 100 mg by mouth daily.     naloxone (NARCAN) 4 MG/0.1ML nasal spray     1 spray by Nasal route once as needed.For signs of opioid overdose.     neomycin-bacitracin-polymyxin (NEOSPORIN) ointment     Apply topically 2 (two) times daily.     oxyCODONE (OXYCONTIN) 10 MG 12 hr tablet     Take 10 mg by mouth every 12 (twelve)  hours.     pantoprazole (PROTONIX) 40 MG tablet     Take 40 mg by mouth daily.        petrolatum (AQUAPHOR) ointment     Apply topically as needed.     Pitavastatin Calcium (LIVALO) 2 MG Tab     Take 1 tablet by mouth nightly.         Polyethyl Glycol-Propyl Glycol (SYSTANE ULTRA OP)     Apply 1 drop to eye 4 (four) times daily.         polyethylene glycol (MIRALAX) packet     Take 17 g by mouth daily.     potassium chloride (K-TAB,KLOR-CON) 10 MEQ tablet     Take 20 mEq by mouth daily.         predniSONE (DELTASONE) 20 MG tablet     Take 2 tablets (40 mg total) by mouth every morning with breakfast.     Patient taking differently:  Take 10 mg by mouth every morning with breakfast.         raNITIdine (ZANTAC) 150 MG tablet     Take 150 mg by mouth 2 (two) times daily.     SYMBICORT 160-4.5 MCG/ACT inhaler     Inhale 2 puffs into the lungs 2 (two) times daily.        tiotropium (SPIRIVA) 18 MCG inhalation capsule     Place 18 mcg into inhaler and inhale daily.     traZODone (DESYREL) 100 MG tablet     Take 50 mg by mouth nightly.         UNABLE TO FIND     Take 1 capsule by mouth 2 (two) times daily.Med Name: Tristan Schroeder           Review of Systems  Pertinent positives:  Fall, left shoulder pain, left cheek pain    Constitutional:  Denies fever or chills   Eyes:  Denies change in visual acuity or eye pain  HENT:  Denies sore throat, trouble swallowing   Respiratory:  Denies cough or shortness of breath or wheeze   Cardiovascular:  Denies chest pain or edema   GI:  Denies abdominal pain, nausea, vomiting, bloody stools or diarrhea  GU:  Denies dysuria, hematuria, frequency or trouble urinating   Musculoskeletal:  Denies other joint pain  Integument:  Denies rash  or wound  Neurologic:  Denies headache, focal weakness, numbness, tingling, speech, vision or gait changes  Psychiatric:  Denies suicidal  ideation.        Physical Exam    BP: 153/63, Heart Rate: 96, Temp: 97.1 F (36.2 C), Resp Rate: 18, SpO2: 95 %, Weight:  91.3  kg    Physical Exam  Pertinent Positives:  L shoulder severe tenderness, decreased ROM due to pain. Mild ttp Lumbar spine.        Constitutional:  Well developed, well nourished, mild acute distress, not ill appearing   Eyes:   conjunctiva normal, no discharge or redness  HENT:  Atraumatic, external ears normal, nose normal, oropharynx moist, no pharyngeal exudates. Neck- normal range of motion, no tenderness, supple   Respiratory:  No respiratory distress, normal breath sounds, no rales, no wheezing, no rhonchi  Cardiovascular:  Normal rate, normal rhythm, no murmurs, no gallops, no rubs, normal radial pulses and dpp   GI:  Soft, nondistended, nontender, no organomegaly, no mass, no rebound, no guarding   GU:  No costovertebral angle tenderness   Musculoskeletal:  No edema,, no deformities. Back- no c spine or T spine tenderness  Integument:  Well hydrated, no rash   Lymphatic:  No prominent cervical LAD  Neurologic:  Alert & oriented x 3, normal motor function, no focal deficits noted, coordination normal   Psychiatric:  Speech and behavior appropriate       Diagnosis management comments: I, Ferdinand Cava, MD, have been the primary provider for this patient during this Emergency Dept visit.    Oxygen saturation by pulse oximetry is 95%, Normal, none needed.    DDx:   Fracture vs contusion vs sprain vs ich vs other.      Rhythm:  Normal Sinus  Ectopy:  None  Rate:  Normal  Conduction:  No blocks  ST Segments:  Normal ST segments  T Waves:  nonspecific  Axis:  Normal  Q Waves:  None seen  Clinical Impression:  Nonspecfic poor baseline EKG  Interpreted by physician        Patient Progress  Patient progress: stable      MDM and ED Course     ED Medication Orders     Start Ordered     Status Ordering Provider    03/01/18 1254 03/01/18 1253    Once     Route: Intravenous  Ordered Dose: 2 mg     Discontinued Christoper Fabian JAMES    03/01/18 0933 03/01/18 0932  propofol (DIPRIVAN) injection 200 mg  Once      Route: Intravenous  Ordered Dose: 200 mg     Last MAR action:  Stopped Ferdinand Cava    03/01/18 0912 03/01/18 0911  ondansetron (ZOFRAN) injection 4 mg  Once     Route: Intravenous  Ordered Dose: 4 mg     Last MAR action:  Given Ferdinand Cava    03/01/18 0852 03/01/18 0851  fentaNYL (PF) (SUBLIMAZE) injection 75 mcg  Once     Route: Intravenous  Ordered Dose: 75 mcg     Last MAR action:  Given Griffin Gerrard JAMES    03/01/18 0801 03/01/18 0800  fentaNYL (PF) (SUBLIMAZE) injection 50 mcg  Once     Route: Intravenous  Ordered Dose: 50 mcg     Last MAR action:  Given Ferdinand Cava    03/01/18 0647 03/01/18 0646  morphine injection 4 mg  Once     Route: Intravenous  Ordered Dose: 4 mg     Last MAR action:  Given Ferdinand Cava    03/01/18 0647 03/01/18 0646  ondansetron (ZOFRAN) injection 4 mg  Once     Route: Intravenous  Ordered Dose: 4 mg     Last MAR action:  Given Melody Savidge  JAMES    03/01/18 0647 03/01/18 0646  sodium chloride 0.9 % bolus 500 mL  Once     Route: Intravenous  Ordered Dose: 500 mL     Last MAR action:  Stopped Ferdinand Cava             MDM                 Orthopedic Injury  Date/Time: 03/01/2018 8:00 AM  Performed by: Ferdinand Cava  Authorized by: Ferdinand Cava   Consent: Written consent obtained.  Risks and benefits: risks, benefits and alternatives were discussed  Consent given by: patient  Patient understanding: patient states understanding of the procedure being performed  Patient consent: the patient's understanding of the procedure matches consent given  Procedure consent: procedure consent matches procedure scheduled  Required items: required blood products, implants, devices, and special equipment available  Patient identity confirmed: verbally with patient  Time out: Immediately prior to procedure a "time out" was called to verify the correct patient, procedure, equipment, support staff and site/side marked as required.  Injury  location: shoulder  Location details: left shoulder  Injury type: dislocation  Hill-Sachs deformity: no  Chronicity: new  Pre-procedure distal perfusion: normal  Pre-procedure neurological function: normal  Pre-procedure range of motion: reduced    Anesthesia:  Local anesthesia used: no    Sedation:  Patient sedated: yes  Sedation type: moderate (conscious) sedation  Sedatives: propofol  Sedation start date/time: 03/01/2018 11:30 AM  Sedation end date/time: 03/01/2018 11:41 AM  Vitals: Vital signs were monitored during sedation.  Immobilization: sling  Post-procedure neurovascular assessment: post-procedure neurovascularly intact  Post-procedure distal perfusion: normal  Post-procedure neurological function: normal  Post-procedure range of motion: normal  Patient tolerance: Patient tolerated the procedure well with no immediate complications        Results     Procedure Component Value Units Date/Time    Cell MorpHology [[id number]]  (Abnormal) Collected:  03/01/18 0657     Updated:  03/01/18 0725     Cell Morphology: Abnormal (A)     Platelet Estimate Normal     Microcytic 2+ (A)     Polychromasia Present (A)     Ovalocytes Present (A)    CBC with differential [[id number]]  (Abnormal) Collected:  03/01/18 0657    Specimen:  Blood from Blood Updated:  03/01/18 0725     WBC 11.55 (H) x10 3/uL      Hgb 9.6 (L) g/dL      Hematocrit 78.2 (L) %      Platelets 400 (H) x10 3/uL      RBC 4.45 x10 6/uL      MCV 77.3 (L) fL      MCH 21.6 (L) pg      MCHC 27.9 (L) g/dL      RDW 19 (H) %      MPV 9.2 fL      Neutrophils 81.1 %      Lymphocytes Automated 12.3 %      Monocytes 4.4 %      Eosinophils Automated 0.7 %      Basophils Automated 0.5 %      Immature Granulocyte 1.0 %      Nucleated RBC 0.0 /100 WBC      Neutrophils Absolute 9.37 (H) x10 3/uL      Abs Lymph Automated 1.42 x10 3/uL      Abs Mono Automated 0.51 x10 3/uL      Abs Eos Automated  0.08 x10 3/uL      Absolute Baso Automated 0.06 x10 3/uL      Absolute Immature  Granulocyte 0.11 (H) x10 3/uL      Absolute NRBC 0.00 x10 3/uL     Troponin I [[id number]] Collected:  03/01/18 0657    Specimen:  Blood Updated:  03/01/18 0724     Troponin I <0.01 ng/mL     Magnesium [[id number]] Collected:  03/01/18 0657    Specimen:  Blood Updated:  03/01/18 0718     Magnesium 1.7 mg/dL     CREATINE KINASE LEVEL (CK) [[id number]]  (Abnormal) Collected:  03/01/18 0657    Specimen:  Blood Updated:  03/01/18 0718     Creatine Kinase (CK) 27 (L) U/L     GFR [[id number]] Collected:  03/01/18 0657     Updated:  03/01/18 0718     EGFR >60.0    Comprehensive metabolic panel [[id number]]  (Abnormal) Collected:  03/01/18 0657    Specimen:  Blood Updated:  03/01/18 0718     Glucose 157 (H) mg/dL      BUN 24.4 mg/dL      Creatinine 0.9 mg/dL      Sodium 010 mEq/L      Potassium 3.5 mEq/L      Chloride 98 (L) mEq/L      CO2 33 (H) mEq/L      Calcium 9.6 mg/dL      Protein, Total 6.8 g/dL      Albumin 3.8 g/dL      AST (SGOT) 11 U/L      ALT 10 U/L      Alkaline Phosphatase 80 U/L      Bilirubin, Total 0.3 mg/dL      Globulin 3.0 g/dL      Albumin/Globulin Ratio 1.3     Anion Gap 13.0        Radiology Results (24 Hour)     Procedure Component Value Units Date/Time    XR Elbow Left AP And Lateral [[id number]] Collected:  03/01/18 1243    Order Status:  Completed Updated:  03/01/18 1251    Narrative:       AP and crosstable lateral views of the left elbow.    CLINICAL INDICATION: Trauma.    FINDINGS: There is no fracture or dislocation. There is mild soft tissue  swelling along the medial aspect of the elbow joint. No significant  joint effusion.      Impression:         1. No acute osseous injury.  2. Mild soft tissue swelling overlying the elbow joint.    Jasmine December  D'Heureux, MD   03/01/2018 12:47 PM    Shoulder Left 2+ Views [[id number]] Collected:  03/01/18 1241    Order Status:  Completed Updated:  03/01/18 1247    Narrative:       Internal and Y views of the left shoulder.    Clinical indications: Status post  reduction. Comparison study performed  earlier the same day.    FINDINGS: Anterior shoulder dislocation has been successfully reduced.  No fractures are seen. The left lung apex is clear.      Impression:        Status post successful reduction of anterior shoulder  dislocation. No fractures are seen.    Jasmine December  D'Heureux, MD   03/01/2018 12:43 PM    Shoulder Left 2+ Views [[id number]] Collected:  03/01/18 0835    Order Status:  Completed Updated:  03/01/18 2951  Narrative:       History: Dizziness. Fall    Neutral AP and Y views of the left shoulder were obtained. Humeral head  is medial and anterior to the glenoid, consistent with anterior  dislocation. No significant bone lesion. Acromioclavicular joint is  unremarkable. No soft tissue calcification is evident. Left lung apex is  clear.      Impression:         1. Anterior shoulder dislocation.  2. No fracture.     Jasmine December  D'Heureux, MD   03/01/2018 8:37 AM    XR Chest AP Only [[id number]] Collected:  03/01/18 4098    Order Status:  Completed Updated:  03/01/18 0839    Narrative:       History:  Status post fall. Dizziness. Comparison 07/20/2017.    AP supine chest x-ray     Findings: The heart size and contour are normal. Aorta is uncoiled. No  pulmonary edema. There is minimal atelectasis at the lung bases. No  pleural effusion, hilar or mediastinal prominence is evident.      Impression:        Minimal atelectasis at the lung bases.        Jasmine December  D'Heureux, MD   03/01/2018 8:34 AM    Hip Right AP and Lateral with Pelvis [[id number]] Collected:  03/01/18 0830    Order Status:  Completed Updated:  03/01/18 0837    Narrative:       AP view of the pelvis and frog-leg lateral view of the right hip.    CLINICAL INDICATION: Dizziness. Fall. No prior studies for comparison.    FINDINGS: There is no fracture or dislocation. There is mild to moderate  narrowing of bilateral hip joint spaces. The pubic rami are intact.  Status post fusion of the lumbar sacral spine.       Impression:         1. No acute osseous injury.  2. Mild osteoarthritis both hips.    Jasmine December  D'Heureux, MD   03/01/2018 8:32 AM    CT L- Spine without Contrast [[id number]] Collected:  03/01/18 0756    Order Status:  Completed Updated:  03/01/18 0807    Narrative:       HISTORY: Posttraumatic pain    TECHNIQUE: Axial CT images of the lumbar spine were obtained with  coronal and sagittal reconstructions    COMPARISON: Prior study 02/04/2017    FINDINGS: Again identified are postsurgical changes status post  posterior instrumented fusion L4-S1. The paired pedicle screws and  paraspinal stabilization rods are intact. There is solid ankylosis of  the posterior elements at these levels. Osseous alignment is unchanged,  with grade 1 anterolisthesis of L4 on L5. There is also retrolisthesis  of L3 on L4 that measures 2 mm. There is endplate osteophytosis, loss of  disc height, vacuum disc phenomenon at L3-L4, similar to the prior  study. There is a chronic defect involving the superior endplate of L1  on the right. No acute fracture is identified.      Impression:        Postsurgical and degenerative changes, not significantly  changed compared to the prior study. No acute fracture is identified.    Nicoletta Dress, MD   03/01/2018 8:03 AM    CT Cervical Spine WO Contrast [[id number]] Collected:  03/01/18 0752    Order Status:  Completed Updated:  03/01/18 0800    Narrative:       HISTORY: Trauma with posttraumatic pain.  TECHNIQUE: Axial CT imaging of the cervical spine was performed, with  coronal and sagittal reconstructions.    The following dose reduction techniques were utilized: automated  exposure control and/or adjustment of the mA and/or kV according to  patient size, and the use of iterative reconstruction technique.    FINDINGS: No cervical spine fracture is detected. There is straightening  of the curvature of the cervical spine which may be due to patient  positioning, muscle spasm, or ligamentous injury. There  are multilevel  degenerative changes that are most pronounced at C5-C6 with disc space  narrowing and endplate osteophytosis.     The paravertebral soft tissues are unremarkable. The lung apices are  clear.      Impression:         No cervical spine fracture is detected.    Nicoletta Dress, MD   03/01/2018 7:55 AM    CT Head WO Contrast [[id number]] Collected:  03/01/18 0744    Order Status:  Completed Updated:  03/01/18 0756    Narrative:       HISTORY: Posttraumatic pain    TECHNIQUE: Axial CT images of the head were obtained, without  intravenous contrast.    The following dose reduction techniques were utilized: automated  exposure control and/or adjustment of the mA and/or kV according to  patient size, and the use of iterative reconstruction technique.    COMPARISON: 02/04/2017    FINDINGS: There are patchy hypodensities in the supratentorial white  matter which likely reflect chronic small vessel ischemic changes. There  is no mass effect or midline shift. There is no evidence of an acute  intracranial hemorrhage or territorial infarct. The osseous structures  are unremarkable.          Impression:          1. No acute intracranial hemorrhage or mass effect.  2. Chronic appearing small vessel ischemic changes.    Nicoletta Dress, MD   03/01/2018 7:52 AM    CT Maxillofacial Bones [[id number]] Collected:  03/01/18 0747    Order Status:  Completed Updated:  03/01/18 0755    Narrative:       HISTORY: Posttraumatic pain    TECHNIQUE: Axial CT images of the facial bones were obtained, with  coronal and sagittal reconstructions.    The following dose reduction techniques were utilized: automated  exposure control and/or adjustment of the mA and/or kV according to  patient size, and the use of iterative reconstruction technique.    FINDINGS: There is a hematoma involving the left facial soft tissues. No  underlying maxillofacial fracture is identified. The orbits are intact.  The globes appear somewhat proptotic bilaterally,  and the patient is  status post lens extraction on the right. There are no air-fluid levels  in the paranasal sinuses.      Impression:        Left facial soft tissue injury. No acute maxillofacial  fracture is identified.     Nicoletta Dress, MD   03/01/2018 7:51 AM          Clinical Impression & Disposition     Clinical Impression  Final diagnoses:   Anterior shoulder dislocation, left, initial encounter   Microcytic anemia   Thrombocytopenia   Blood glucose elevated   Elevated blood pressure reading   Facial contusion, initial encounter   Fall, initial encounter        ED Disposition     ED Disposition Condition Date/Time Comment    Discharge  Tue Mar 01, 2018  3:10 PM ED obs             Discharge Medication List as of 03/01/2018  1:58 PM                    Ferdinand Cava, MD  03/03/18 [phone]

## 2018-03-01 NOTE — Discharge Instructions (Signed)
YOU MAY SEE THE DOCTOR LISTED IF YOUR DOCTOR IS NOT AVAILABLE FOR FOLLOW UP IN THE TIME FRAME PROVIDED.  RETURN TO THE EMERGENCY DEPARTMENT IMMEDIATELY FOR ANY WORSENING OR CONCERNS.      Elevate the injured part above your heart as often as possible and Apply cold packs to area for 20 minutes of every 2 hours for the first 48 hours after injury.    No using left hand or arm.  No pressure on Left hand or arm.      Dislocation, Shoulder    You have been seen for a dislocation of your shoulder.    The dislocation has been reduced (repaired). The bones are now in their normal position.    A dislocation is when one of the 2 bones that make up a joint gets out of place. It is no longer in the right place to let the joint bend. Sometimes a ligament or set of ligaments is injured during the dislocation. Shoulder dislocations can also injure one of the nerves which give the arm and hand sensation (feeling). There may also be a fracture (broken bone) with a dislocation.    After the bones are relocated (moved), general care involves medicine to reduce pain. It also includes a splint/cast to reduce movement. It also includes Rest, Ice, Compression and Elevation of the injured area. Remember this as "RICE."   REST: Limit the use of the injured body part.   ICE: By applying ice to the affected area, swelling and pain can be reduced. Place some ice cubes in a re-sealable (Ziploc) bag and add some water. Put a thin washcloth between the bag and the skin. Apply the ice bag to the area for at least 20 minutes. Do this at least 4 times per day. It is okay to do this more often than directed. You can also do it for longer than directed. NEVER APPLY ICE DIRECTLY TO THE SKIN.   COMPRESS: Compression means to apply pressure around the injured area such as with a splint, cast or an ACE bandage. Compression decreases swelling and improves comfort. Compression should be tight enough to relieve swelling but not so tight as to  decrease circulation. Increasing pain, numbness, tingling, or change in skin color, are all signs of decreased circulation.   ELEVATE: Elevate the injured part. For example, an arm can be elevated with a sling while standing and sitting. It can be elevated by propping it up on pillows while lying down.    You have been given a sling and swath for your injury. This will help keep the injured part elevated. It will also improve comfort.    YOU SHOULD SEEK MEDICAL ATTENTION IMMEDIATELY, EITHER HERE OR AT THE NEAREST EMERGENCY DEPARTMENT, IF ANY OF THE FOLLOWING OCCURS:   The affected area gets swollen or much more painful.   New numbness and tingling in or below the affected area.   You develop a cold hand that seems to have blood supply problems.           Facial Contusion    You have been diagnosed with a facial contusion.    Contusion is the medical term for a bruise. A facial contusion can be caused by a fall or by being struck in the face.    The skin, muscles and other soft tissues of the face may become swollen and painful. You may have other injuries, like cuts or scrapes. The bones under your face might be bruised.  The doctor does not believe you have injured essential organs, like your eyes, brain or spine.    Apply ice to the face to help with pain and swelling. Place some ice cubes in a re-sealable plastic bag (like Ziploc). Add some water. Seal the bag. Put a thin washcloth between the bag and the skin. Apply the ice bag for at least 20 minutes. Do this at least 4 times per day. It's okay to apply ice longer or more often. NEVER APPLY ICE DIRECTLY TO THE SKIN. Always keep a washcloth between the ice pack and your body. Swelling may increase overnight when your head is down and gravity causes fluids to pool in your face. This should improve within a few hours after you are awake with your head up. Try sleeping with extra pillows to keep your head high.    Use acetaminophen (Tylenol) or  ibuprofen (Advil or Motrin) to decrease pain and inflammation. The physician will decide if you need a prescription medication.    If your nose bleeds, pinch it closed for 15 minutes. If that does not stop the bleeding, then return here or to the closest Emergency Department.    If you have a cut that requires stitches, then you will receive additional wound care instructions.    One concern after a facial injury is the possibility of other injuries to the head or neck. The doctor has determined that you do not have any other serious injuries and that it is OK for you to go home. If you develop symptoms of a head or neck injury, return immediately to the nearest Emergency Department.    YOU SHOULD SEEK MEDICAL ATTENTION IMMEDIATELY, EITHER HERE OR AT THE NEAREST EMERGENCY DEPARTMENT, IF ANY OF THE FOLLOWING OCCURS:   Your headaches are severe or become worse.   You vomit repeatedly.   You are lethargic or difficult to awaken or you feel confused or seem intoxicated (drunk).    You have trouble with coordination or balance, feel dizzy, pass out, or have difficulty speaking or slurred speech.    Your vision changes or your pupils are unequal in size.

## 2018-03-01 NOTE — Progress Notes (Signed)
03/01/18 1348   Patient Type   Within 30 Days of Previous Admission? No   Healthcare Decisions   Interviewed: Patient;Family   Orientation/Decision Making Abilities of Patient Alert and Oriented x3, able to make decisions   Advance Directive Patient has advance directive, copy not in chart   Advance Directive not in Chart Copy requested from family/decision maker   Healthcare Agent Appointed Yes   Healthcare Agent's Name Pnt verbalized to CM Lavell Anchors, daughter  The official POA on paperwork maybe her son who lives in [place] but pnt plans to change POA to daughter since she is Advertising copywriter Agent's Phone Number [phone]    Additional Emergency Contacts? NA    Prior to admission   Prior level of function Ambulates with assistive device;Needs assistance with ADLs  (Pnt reports being independent in room at ALF w no DME but normally walks w walker or uses her electric scooter because of SOB.  ALF staff supervise and assist as needed w ADL care.  Pnt was just upgraded to Level 2 of care at ALF)   Type of Residence Assisted living  [facility] ALF room in basement, they have elevators )   Home Layout One level   Have running water, electricity, heat, etc? Yes   Living Arrangements Other (Comment)   How do you get to your MD appointments? PCP comes to ALF and daughter transports for specialists if need.  CM gave daughter information on H&M transport and other senior transport because pnt will be starting lymphedema tx daily for two weeks.    How do you get your groceries? ALF    Who fixes your meals? ALF    Who does your laundry? ALF    Who picks up your prescriptions? ALF    Dressing Needs assistance   Grooming Independent   Feeding Independent   Bathing Needs assistance   Toileting Independent   DME Currently at [employer] scooter;Four wheel walker;Other (Comment)  (3 wheel walker w basket, CPAP, nebulizer.  Pnt had home O2 2018 but has not needed it and it was d/c'd, pnt is regularly eval'd for need  by MDs. )   Home Care/Community Services Home PT/OT/Speech   Name of Agency [facility]    Frequency of services PT for lymphedema tx    Prior SNF admission? (Detail) [facility]    Prior Rehab admission? (Detail) NA    Adult Protective Services (APS) involved? No   Discharge Planning   Support Systems Children;Friends/neighbors;Home care staff  (Pnt reports good family support with her 2 chn and their families (son [place], dau local).  Her friends at ALF and staff she counts as support. )   Patient expects to be discharged to: [facility] ALF w PT and transport resources   Anticipated [place] plan discussed with: Same as interviewed   Potential barriers to discharge: Decreased mobility   Mode of transportation: Private car (family member)   Consults/Providers   PT Evaluation Needed 1  (yes)   OT Evalulation Needed 1  (yes)   SLP Evaluation Needed 2  (no)   Outcome Palliative Care Screen Screened but did not meet criteria for intervention   Correct PCP listed in Epic? Yes   Important Message from [facility] Notice   Patient received 1st IMM Letter? No  (ED only )

## 2018-03-03 ENCOUNTER — Ambulatory Visit: Payer: No Typology Code available for payment source

## 2018-03-10 ENCOUNTER — Ambulatory Visit: Payer: No Typology Code available for payment source

## 2018-03-10 DIAGNOSIS — I890 Lymphedema, not elsewhere classified: Secondary | ICD-10-CM

## 2018-03-10 NOTE — Progress Notes (Signed)
[facility]  [address], [address]  [place], [place]  [address]  Phone:  [phone]  Fax:  [phone]    PHYSICAL THERAPY DAILY TREATMENT NOTE      PATIENT: Rachael [name] DOB: June 11, 1936   MR #: [id number]  AGE: 82 y.o.    FACILITY PROVIDER #: [id number] PRIMARY MD: Behiri, Amr H, DO    HICN# Medicare Sub. Num: [id number] DIAGNOSES: Lymphedema [I89.0]      Date of Service PT Received On: 03/10/18   Treatment Time Start Time: 1715 to Stop Time: 1820   Time Calculation Time Calculation (min): 65 min   Visit # PT Visit  PT Visit Number: 2   Units Billed   Therapeutic Interventions  $ PT Lymphedema Drng (41324): 4 Units    MWNU([id number])     Rachael [name] referred for physical therapy services by: Gita Kudo, NP    Certification period, precautions, medications and allergies and goals copied from initial evaluation - reviewed and reconciled today.  Leodis Binet, PT 03/10/2018      Dates of Certification:   02/17/2018 through 05/19/2018    Medical History:  HTN; COPD; OA; polymyalgia rheumatica; cataracts; incontinence; DM; difficulty hearing; visual impairments; hx back surgery; hx carpal tunnel release (bilateral); hx hysterectomy; hx sclerotherapy to veins; hx wounds.    Medications:  alrex drops; amitiza; aspirin; bumetanide; celebrex; zrytec; cardiziem; cymbalta; gabapentin; guaifenesin; humalog; hydralazine; ydrocodone-acetaminophen; lactinex; lantus; levothyroxine; lidocaine; livalo; metformin; metoporolol; oxycontin; pantoprazole; polyethylene glycol; potassium; prednisone; spiriva; symbicort; systane; trazadone; xanax; biscolax; narcan; ranitidine    Allergies: latex; lyrica; NSAIDs    Goals:    1. Patient/ caregiver will verbalize understanding of lymphedema etiology, risk reduction, skin care, role of exercises to aid in decongestion, options/ recommendations for management strategies within 12 visits.   2. Patient will  have girth based limb volumetry; pulse assessment; and, protective sensory assessment prior to initiation of treatment to establish accurate baseline.   3. Patient/ caregiver will demonstrate proficiency with safe application of multilayer short stretch compression bandaging (or alternative) within 10-12 visits over 2-3 weeks.   4. Patient will demonstrate objective reduction in edema via (limb volumetry/girth measurements/photography), with stabilization of reduction within 16 visits 12 weeks.   5. Patient will demonstrate ability to manage garments independently with or without assistive devices within 4-6 after receipt and instruction.   6. Discharge goal is for patient/caregiver to be able to maintain optimal control of BLE lymphedema with well fitting, comfortable daytime garments, nighttime garments, sequential compression pump within 12-16 sessions over 12 weeks.  7. Assist patient with procurement of compression garments through outside vendor using available insurance benefits.   *Working Toward the Above Goals: (copied from last visit)*    GOAL# Progress Toward Goals   1 Progressing   2 Not Addressed Today   3 Discontinued   4 Progressing   5 Not Addressed Today   6 Progressing   7 Progressing       EXAMINATION:     Subjective Report:     In interim since last visit/eval patient experienced a nasty fall into her bath tub in the wee hours of the morning on 03/01/2018 when rushing to the toilet. She has sustained a broken left shoulder and many hematomas. Patient denies change in LE edema. She is looking forward to discussing/confirming a treatment plan in address of the edema.    Pain:    Patient denies pain today. However,  she remains sore from the fall at left shoulder and entire left face and body.    General inspection/observation:    Arrives with daughter. Patient is in a wheelchair with a sling on her left  shoulder. Her right face, cheek and neck are purple in color from bruising sustained in fall.  She has the [facility] Hybrid liner socks donned, but not the outer compression - these were not donned by the staff this morning as per usual.      Treatment Performed Today:   1. Update obtained.  2. Daughter shared that she has learned there is transportation available to facilitate patient's participation in 5x/week compression bandage application and transition to garments. There is still some concern she has regarding staff following through with regard to the donning/doffing routines.  3. Discussed results of benefits determination for coverage of compression. She does not have compression garment coverage. Daughter, however, was able to call the insurance company and found out that patient has coverage for sequential pumps, however.   4. Patient and daughter inquired of the appropriateness of a pump. This was part of patient's poc at evaluation and given immobility and fall risk in the setting of unstable edema despite years in compression absolutely indicated and appropriate. Therapist suggested scheduling an upcoming appointment for trial of effectiveness and assessment of response before ordering one. Patient agreeable.   5. Much problem solving regarding a new POC given patient is risk to fall, has fallen, and, given broken shoulder, cannot utilize her walker for mobility in the setting of increased mobility challenges inherent with intended compression bandage application. Patient inquires if ordering new garments would be good enough given mobility for the management of her edema. This was tossed around a bit and reasonable to pursue. Given intention for sequential pump trial, therapist suggests obtaining measurements for these may next visit post pump trial.        EVALUATION AND DIAGNOSIS:   Patient was evaluated and POC including CDT via compression bandages was recommended with intention to discuss feasibility at today's session. In the interim, the patient had a nasty fall in which she sustained  numerous bruises and a fractured left glenoid fossa. She is currently immobilized in a shoulder sling with additional orthopedic follow-up intended next week. It has been decided that pursuing new compression wraps (farrow strong) for lower legs and sequential pump would be a better plan to reduce the burden of visit frequency where patient is now more homebound; reduce hardship burden on daughter and caregivers; and, decrease increased risk to fall (in the setting of a fall with injury) inherent with bandage application where BUE support on a walker is essential (but weight bearing is contraindicated with shoulder fracture).     PLAN:   Plan sequential pump trial at right LE followed immediately by measurements for compression wraps at next visit.     Therapist Signature:    Leodis Binet MPT, CLT-LANA  03/10/2018

## 2018-03-21 ENCOUNTER — Ambulatory Visit: Payer: No Typology Code available for payment source

## 2018-03-24 ENCOUNTER — Ambulatory Visit: Payer: No Typology Code available for payment source

## 2018-04-04 ENCOUNTER — Emergency Department
Admission: EM | Admit: 2018-04-04 | Discharge: 2018-04-04 | Disposition: A | Discharge status: Home / Self Care | Payer: No Typology Code available for payment source | Attending: Emergency Medicine | Admitting: Emergency Medicine

## 2018-04-04 DIAGNOSIS — E785 Hyperlipidemia, unspecified: Secondary | ICD-10-CM | POA: Insufficient documentation

## 2018-04-04 DIAGNOSIS — E039 Hypothyroidism, unspecified: Secondary | ICD-10-CM | POA: Insufficient documentation

## 2018-04-04 DIAGNOSIS — E119 Type 2 diabetes mellitus without complications: Secondary | ICD-10-CM | POA: Insufficient documentation

## 2018-04-04 DIAGNOSIS — J449 Chronic obstructive pulmonary disease, unspecified: Secondary | ICD-10-CM | POA: Insufficient documentation

## 2018-04-04 DIAGNOSIS — Z7982 Long term (current) use of aspirin: Secondary | ICD-10-CM | POA: Insufficient documentation

## 2018-04-04 DIAGNOSIS — I10 Essential (primary) hypertension: Secondary | ICD-10-CM | POA: Insufficient documentation

## 2018-04-04 DIAGNOSIS — Z79899 Other long term (current) drug therapy: Secondary | ICD-10-CM | POA: Insufficient documentation

## 2018-04-04 DIAGNOSIS — Z886 Allergy status to analgesic agent status: Secondary | ICD-10-CM | POA: Insufficient documentation

## 2018-04-04 DIAGNOSIS — I872 Venous insufficiency (chronic) (peripheral): Secondary | ICD-10-CM | POA: Insufficient documentation

## 2018-04-04 DIAGNOSIS — S8011XA Contusion of right lower leg, initial encounter: Secondary | ICD-10-CM

## 2018-04-04 DIAGNOSIS — Z794 Long term (current) use of insulin: Secondary | ICD-10-CM | POA: Insufficient documentation

## 2018-04-04 DIAGNOSIS — W230XXA Caught, crushed, jammed, or pinched between moving objects, initial encounter: Secondary | ICD-10-CM | POA: Insufficient documentation

## 2018-04-04 DIAGNOSIS — S8010XA Contusion of unspecified lower leg, initial encounter: Secondary | ICD-10-CM | POA: Insufficient documentation

## 2018-04-04 DIAGNOSIS — I890 Lymphedema, not elsewhere classified: Secondary | ICD-10-CM | POA: Insufficient documentation

## 2018-04-04 MED ORDER — CEPHALEXIN 500 MG PO CAPS
500.00 mg | ORAL_CAPSULE | Freq: Three times a day (TID) | ORAL | 0 refills | Status: AC
Start: 2018-04-04 — End: 2018-04-11

## 2018-04-04 MED ORDER — CEPHALEXIN 500 MG PO CAPS
500.00 mg | ORAL_CAPSULE | Freq: Once | ORAL | Status: AC
Start: 2018-04-04 — End: 2018-04-04
  Administered 2018-04-04: 18:00:00 500 mg via ORAL
  Filled 2018-04-04: qty 1

## 2018-04-04 NOTE — ED Triage Notes (Signed)
Rachael [name] is a 82 y.o. female  Elevators closed in on bilateral legs, her legs started to swell has history of lymphadema legs are swelling today.  Incident happened last Thursday.

## 2018-04-04 NOTE — EDIE (Signed)
COLLECTIVE?NOTIFICATION?04/04/2018 17:00?Ailin, Rochford C?MRN: [id number]    [facility]'s patient encounter information:   ZSW:?[id number]  Account [id number]  Billing Account [id number]      Criteria Met      Narx Scores Alert    Security and Safety  No recent Security Events currently on file    ED Care Guidelines  There are currently no ED Care Guidelines for this patient. Please check your facility's medical records system.      Prescription Monitoring Program  652??- Narcotic Use Score  310??- Sedative Use Score  000??- Stimulant Use Score  350??- Overdose Risk Score  - All Scores range from 000-999 with 75% of the population scoring < 200 and on 1% scoring above 650  - The last digit of the narcotic, sedative, and stimulant score indicates the number of active prescriptions of that type  - Higher Use scores correlate with increased prescribers, pharmacies, mg equiv, and overlapping prescriptions  - Higher Overdose Risk Scores correlate with increased risk of unintentional overdose death   Concerning or unexpectedly high scores should prompt a review of the PMP record; this does not constitute checking PMP for prescribing purposes.      E.D. Visit Count (12 mo.)  Facility Visits   [facility] 3   Total 3   Note: Visits indicate total known visits.      Recent Emergency Department Visit Summary  Date Facility [facility] Type Diagnoses or Chief Complaint   Apr 04, 2018 [name]      Fluid In legs      Mar 01, 2018 [name]      Fall      Iron deficiency anemia, unspecified      Contusion of other part of head, initial encounter      Elevated blood-pressure reading, without diagnosis of hypertension      Thrombocytopenia, unspecified      Anterior dislocation of left humerus, initial encounter      Hyperglycemia, unspecified      Unspecified fall, initial encounter      Jul 20, 2017 [facility] [place]  [facility]gency      shortness of breath      Other forms of dyspnea          Recent Inpatient Visit Summary  No recorded inpatient visits.     Care Providers  There are no care providers on record at this time.   Collective Portal  This patient has registered at the [facility] Emergency Department   For more information visit: [email or web address]     PLEASE NOTE:    1.   Any care recommendations and other clinical information are provided as guidelines or for historical purposes only, and providers should exercise their own clinical judgment when providing care.    2.   You may only use this information for purposes of treatment, payment or health care operations activities, and subject to the limitations of applicable Collective Policies.    3.   You should consult directly with the organization that provided a care guideline or other clinical history with any questions about additional information or accuracy or completeness of information provided.    ? 2019 [place], [employer] - [email or web address]

## 2018-04-04 NOTE — Progress Notes (Signed)
CM met with patient. She has wound on leg that requires close follow for care. She attempted to follow up with the wound clinic at [facility] but they did not accept her insurance. She notified CM she had used a home health agency [employer]) and would like to resume home health for the care. CM spoke with Dr. Yancey Flemings and he placed home face to face order. Voicemail left for home health liaisons. No other discharge needs identified, CM will continue to follow as needed.

## 2018-04-04 NOTE — Discharge Instructions (Signed)
Hematoma    You have been diagnosed with a hematoma.    A hematoma is a collection of blood under the skin usually caused by injury to the area. In other words, it is a very large bruise.    Put ice on the area 15 minutes out of every hour to help with swelling and pain. Putting ice on the affected area can reduce pain and swelling. Put some ice cubes in a re-sealable (Ziploc) bag. Add some water. Put a thin washcloth between the bag and the skin. Apply the ice bag to the area for at least 20 minutes. Do this at least 4 times per day. Longer times and more often are OK. NEVER APPLY ICE DIRECTLY TO THE SKIN! If your injury is on your hand or foot, lift it above the level of your heart to help with swelling.    YOU SHOULD SEEK MEDICAL ATTENTION IMMEDIATELY, EITHER HERE OR AT THE NEAREST EMERGENCY DEPARTMENT, IF ANY OF THE FOLLOWING OCCURS:   The hematoma keeps getting bigger.   Fever (temperature higher than 100.4F / 38C) or shaking chills.   Redness that surrounds or spreads outward from the area.   Breakdown of the skin in the area affected.   Very bad-smelling drainage from the injured area.

## 2018-04-04 NOTE — ED Provider Notes (Addendum)
Physician/Midlevel provider first contact with patient: 04/04/18 1737         History     Chief Complaint   Patient presents with   . Leg Swelling     HPI     DR. DARWIN Lothar Prehn  is the primary attending for this patient and performed the HPI, PE, and medical decision making for this patient.    Patient Name: Rachael [name], Rachael [name], 82 y.o., female    History obtained by: Patient, daughter    Chief Complaint: leg wound  Onset: 4 days ago  Context: elevator closed on anterior R leg.  +hematoma. Lives at assisted living facility, [facility].  Sent here for further wound managment  Location: R shin  Severity: mild  Quality: burning  Timing: intermittent  Progression:  improving  Radiation:  none  Alleviating Factors: nothing  Ineffective Treatments:  None tried  Exacerbating Factors: palpation  Associated Signs and Symptoms: tetanus utd.  denies loss of rom/anticoag meds/fever/discharge/calf pain/cp/sob.      HPI: R leg wound 4 days ago.  Lives in assisted living facility, [facility].  Sent here for further wound management. denies loss of rom/anticoag meds/fever/discharge/calf pain/cp/sob.      Past Medical History:   Diagnosis Date   . Anemia    . Anxiety    . Arthritis    . 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 Chronic cholecystitis with calculus 10/31/2015   . Chronic obstructive pulmonary disease    . Gastroesophageal reflux disease    . Hiatal hernia    . Hyperlipidemia    . Hypertension    . Hypothyroidism    . Incontinent of urine 2014    -WEARS DIAPERS-WILL BRING HER OWN   . Low back pain    . Lymphedema of right lower extremity    . PMR (polymyalgia rheumatica) 01/07/2017   . Polymyalgia rheumatica 09/2014   . Sleep apnea     USES CPAP NIGHTLY   . Type 2 diabetes mellitus, controlled    . Type II or unspecified type diabetes mellitus without mention of complication, not stated as uncontrolled    . Venous insufficiency of right leg 2016    also has lymphedema-KEEPS HER LEGS WRAPPED EXCEPT  FOR WHEN SHE BATHES       Past Surgical History:   Procedure Laterality Date   . BACK SURGERY  2014   . CARPAL TUNNEL RELEASE Bilateral 1978   . CLOSED REDUCTION SHOULDER DISLOCATION     . HYSTERECTOMY  1985   . RIGHT & LEFT HEART CATH  W/ CORONARY ANGIOS, LV/LA Bilateral 01/26/2018    Procedure: Right & Left Heart Cath  w/ Coronary Angios, LV/LA;  Surgeon: Elbert Ewings, MD;  [facility];  Service: Cardiovascular;  Laterality: Bilateral;   . ROBOTIC, CHOLECYSTECTOMY N/A 10/31/2015    Procedure: ROBOT ASSISTED, LAPAROSCOPIC, CHOLECYSTECTOMY, UMBILICAL HERNIA REPAIR;  Surgeon: Alen Blew, MD;  Location: [facility];  Service: General;  Laterality: N/A;       History reviewed. No pertinent family history.    Social  Social History   Substance Use Topics   . Smoking status: Former Smoker     Packs/day: 1.00     Years: 25.00     Quit date: 11/16/1998   . Smokeless tobacco: Never Used   . Alcohol use 1.8 oz/week     3 Glasses of wine per week      Comment: social       .  Allergies   Allergen Reactions   . Tolectin [Tolmetin] Swelling   . Lyrica [Pregabalin]    . Nsaids    . Latex Itching       Home Medications     Med List Status:  In Progress Set By: Ponciano Ort, RN at 04/04/2018  5:22 PM                albuterol-ipratropium (DUO-NEB) 2.5-0.5(3) mg/3 mL nebulizer     Take 3 mLs by nebulization 4 (four) times daily as needed.     ALPRAZolam (XANAX) 0.25 MG tablet     Take 0.25 mg by mouth nightly as needed.     aspirin 81 MG chewable tablet     Chew 1 tablet (81 mg total) by mouth daily.     bisacodyl (BISCOLAX) 10 mg suppository     Place 10 mg rectally daily.     bumetanide (BUMEX) 1 MG tablet     Take 2 mg by mouth 2 (two) times daily         celecoxib (CELEBREX) 200 MG capsule     Take 200 mg by mouth daily.         cetirizine (ZYRTEC) 10 MG tablet     Take 10 mg by mouth daily.     dilTIAZem (CARDIZEM CD) 300 MG 24 hr capsule     Take 1 capsule (300 mg total) by mouth daily.      DULoxetine (CYMBALTA) 60 MG capsule     Take 60 mg by mouth 2 (two) times daily.     gabapentin (NEURONTIN) 300 MG capsule     Take 900 mg by mouth 2 (two) times daily.         guaiFENesin (MUCINEX) 600 MG 12 hr tablet     Take 1 tablet (600 mg total) by mouth every 12 (twelve) hours.     Patient taking differently:  Take 600 mg by mouth 2 (two) times daily.         hydrALAZINE (APRESOLINE) 25 MG tablet     Take 1 tablet by mouth 2 (two) times daily.     HYDROcodone-acetaminophen (NORCO) 5-325 MG per tablet     Take 1 tablet by mouth every 4 (four) hours as needed for Pain.     Patient taking differently:  Take 1 tablet by mouth every 6 (six) hours.         insulin glargine (LANTUS) 100 UNIT/ML injection     Inject 18 Units into the skin nightly.     insulin glargine (LANTUS) 100 UNIT/ML injection     Inject 28 Units into the skin every morning         insulin lispro (HUMALOG) 100 UNIT/ML injection     Inject 1-8 Units into the skin 3 (three) times daily before meals as needed (High blood glucose. See administration instructions.).     Patient taking differently:  Inject 2-10 Units into the skin 4 (four) times daily.         lactobacillus acidophilus & bulgar (LACTINEX) chewable tablet     Chew 1 tablet by mouth 2 (two) times daily.         levothyroxine (SYNTHROID, LEVOTHROID) 50 MCG tablet     Take 50 mcg by mouth Once a day at 6:00am.     lidocaine (LIDODERM) 5 %     Place 1 patch onto the skin daily.Remove & Discard patch within 12 hours or as directed by MD  loperamide (IMODIUM) 2 MG capsule     Take 2 mg by mouth as needed for Diarrhea.     loteprednol (LOTEMAX) 0.2 % Suspension     Place 1 drop into both eyes 4 (four) times daily.         metFORMIN (GLUCOPHAGE-XR) 750 MG 24 hr tablet     Take 750 mg by mouth nightly.     metoprolol succinate (TOPROL-XL) 100 MG 24 hr tablet     Take 100 mg by mouth daily.     naloxone (NARCAN) 4 MG/0.1ML nasal spray     1 spray by Nasal route once as needed.For signs of  opioid overdose.     neomycin-bacitracin-polymyxin (NEOSPORIN) ointment     Apply topically 2 (two) times daily.     oxyCODONE (OXYCONTIN) 10 MG 12 hr tablet     Take 10 mg by mouth every 12 (twelve) hours.     pantoprazole (PROTONIX) 40 MG tablet     Take 40 mg by mouth daily.        petrolatum (AQUAPHOR) ointment     Apply topically as needed.     Pitavastatin Calcium (LIVALO) 2 MG Tab     Take 1 tablet by mouth nightly.         Polyethyl Glycol-Propyl Glycol (SYSTANE ULTRA OP)     Apply 1 drop to eye 4 (four) times daily.         polyethylene glycol (MIRALAX) packet     Take 17 g by mouth daily.     potassium chloride (K-TAB,KLOR-CON) 10 MEQ tablet     Take 20 mEq by mouth daily.         predniSONE (DELTASONE) 20 MG tablet     Take 2 tablets (40 mg total) by mouth every morning with breakfast.     Patient taking differently:  Take 10 mg by mouth every morning with breakfast.         raNITIdine (ZANTAC) 150 MG tablet     Take 150 mg by mouth 2 (two) times daily.     SYMBICORT 160-4.5 MCG/ACT inhaler     Inhale 2 puffs into the lungs 2 (two) times daily.        tiotropium (SPIRIVA) 18 MCG inhalation capsule     Place 18 mcg into inhaler and inhale daily.     traZODone (DESYREL) 100 MG tablet     Take 50 mg by mouth nightly.         UNABLE TO FIND     Take 1 capsule by mouth 2 (two) times daily.Med Name: Tristan Schroeder           Review of Systems   Constitutional: Negative for chills and fever.   HENT: Negative for trouble swallowing and voice change.    Eyes: Negative for pain and visual disturbance.   Respiratory: Negative for chest tightness and shortness of breath.    Cardiovascular: Negative for chest pain and palpitations.   Gastrointestinal: Negative for abdominal pain and nausea.   Genitourinary: Negative for dysuria and flank pain.   Musculoskeletal: Positive for myalgias. Negative for arthralgias, back pain, neck pain and neck stiffness.   Skin: Positive for wound. Negative for color change and rash.    Neurological: Negative for light-headedness and headaches.   All other systems reviewed and are negative.      Physical Exam    BP: 134/74, Heart Rate: 84, Temp: 97.4 F (36.3 C), Resp Rate: 18, Weight: 85.3 kg  Physical Exam   Constitutional: She is oriented to person, place, and time. She appears well-developed. No distress.   HENT:   Head: Normocephalic.   Right Ear: External ear normal.   Left Ear: External ear normal.   Eyes: Pupils are equal, round, and reactive to light. Conjunctivae and EOM are normal.   Neck: Normal range of motion. Neck supple.   Cardiovascular: Normal rate, regular rhythm, normal heart sounds and intact distal pulses.    Pulmonary/Chest: Effort normal and breath sounds normal. No respiratory distress.   Abdominal: Soft. There is no tenderness.   Musculoskeletal: Normal range of motion. She exhibits edema and tenderness.   BLE lymphedema   Neurological: She is alert and oriented to person, place, and time. She has normal strength. No cranial nerve deficit.   Skin: Skin is warm. No rash noted. No pallor.        Neg homanns   Nursing note and vitals reviewed.        MDM and ED Course     ED Medication Orders     Start Ordered     Status Ordering Provider    04/04/18 1815 04/04/18 1814  cephalexin (KEFLEX) capsule 500 mg  Once     Route: Oral  Ordered Dose: 500 mg     Last MAR action:  Given Jerel Sardina, DARWIN-DEAN TABIOS             MDM  Number of Diagnoses or Management Options  Leg hematoma, right, initial encounter:   Lymphedema of both lower extremities:   Diagnosis management comments: No indication to drain hematoma since h/o lymphedema and dm.  Concern for infection or poor wound healing if I&D.  Will allow to spont heal, and routine dressing changes.  Will rx keflex for empiric coverage.    Pt has home health visits.  Pt prefers to f/u with wound clinic prn.    Afebrile.    Possibilities causing patient symptoms are discussed with the patient as is work up and evaluation in  emergency department.  Pt questions are answered and results reviewed with the patient.  Disposition plan is discussed as well.  Instructed pt to return if sx worsen, persist, or if new sx develop. Pt understands and agrees with plan. Pt doing well upon discharge, aaox3, NAD, ambulates.         Amount and/or Complexity of Data Reviewed  Clinical lab tests: ordered and reviewed  Tests in the medicine section of CPT: ordered and reviewed  Decide to obtain previous medical records or to obtain history from someone other than the patient: yes  Obtain history from someone other than the patient: yes  Review and summarize past medical records: yes    Patient Progress  Patient progress: improved                  Results     ** No results found for the last 24 hours. **        Radiology Results (24 Hour)     ** No results found for the last 24 hours. **              This note was generated by the Epic EMR system/Dragon speech recognition and may contain inherent errors or omissions not intended by the user. Grammatical errors, random word insertions, deletions, pronoun errors and incomplete sentences are occasional consequences of this technology due to software limitations. Not all errors are caught or corrected. If there are questions or concerns  about the content of this note or information contained within the body of this dictation they should be addressed directly with the author for clarification.      Procedures    Clinical Impression & Disposition     Clinical Impression  Final diagnoses:   Lymphedema of both lower extremities   Leg hematoma, right, initial encounter        ED Disposition     ED Disposition Condition Date/Time Comment    Discharge  Mon Apr 04, 2018  6:22 PM Rachael [name] discharge to home/self care.    Condition at disposition: Stable           New Prescriptions    CEPHALEXIN (KEFLEX) 500 MG CAPSULE    Take 1 capsule (500 mg total) by mouth 3 (three) times daily for 7 days                    Guadalupe Maple, MD  04/04/18 1851

## 2018-04-05 NOTE — Progress Notes (Signed)
Home Health Referral        By [employer], the patient has the right to freely choose a home care provider.  Arrangements have been made with:     A company of the patients choosing. We have supplied the patient with a listing of providers in your area who asked to be included and participate in Medicare.   The preferred provider of your insurance company. Choosing a home care provider other than your insurance company's preferred provider may affect your insurance coverage.    Home Health Discharge Information     Your doctor has ordered Skilled Nursing in-home service(s) for you while you recuperate at home, to assist you in the transition from hospital to home.      The agency that you or your representative chose to provide the service:  Name of Home Health Agency Placement: [facility]] [phone]    The above services were set up by:  Levonne Hubert   RN, BSN(Home Health Liaison)   Phone       [phone]                                    Additional comments: If you have not heard from your home health agency within 24-48 hours after discharge please call your agency to arrange a time for your first visit.  For any scheduling concerns or questions related to home health, such as time or date please contact your home health agency at the number listed above.     Home Health face-to-face (FTF) Encounter (Order [id number])   Consult   Date: 04/04/2018 Department: [facility] Ordering/Authorizing: Guadalupe Maple, MD   Order Information     Order Date/Time Release Date/Time Start Date/Time End Date/Time   04/04/18 07:15 PM None 04/04/18 07:10 PM 04/04/18 07:10 PM   Order Details     Frequency Duration Priority Order Class   Once 1 occurrence Routine Hospital Performed   Standing Order Information     Remaining Occurrences Interval Last Released      0/1 Once 04/04/2018            Provider Information     Ordering User Ordering Provider Authorizing Provider   Orpah Melter, RN Guadalupe Maple, MD Guadalupe Maple, MD   Attending Provider(s) PCP   Guadalupe Maple, MD Kerin Perna, DO   Verbal Order Info     Action Created on Order Mode Entered by Responsible Provider Signed by Signed on   Ordering 04/04/18 1915 Verbal with Elna Breslow, RN Guadalupe Maple, MD Guadalupe Maple, MD 04/04/18 [phone]   Order Questions     Question Answer Comment   Date of face-to-face (FTF) encounter: 04/04/2018    Medical conditions that necessitate Home Health care: J. Wound requiring assessment, monitoring and care    Clinical findings that support the need for Skilled Nursing. SN will: K. Educate on complex wound care and management    Clinical findings that support the need for Physical Therapy. PT will K. N/A    Clinical findings support the need for OT (needs SN/PT order).OT will F. N/A    Clinical findings that support the need for SLP. ST will F. N/A    Per clinical findings, following services are medically necessary: Skilled Nursing    Evidence this patient is homebound because: N. Impaired mobility d/t pain,  arthritis, weakness that compromises patient safety    Other (please specify) patient needs wound care, has used [facility] in past.          Process Instructions  Please select Home Care Services medically necessary.     Based on the above findings, I certify that this patient is confined to the home and needs intermittent skilled nursing care, physical therapry and / or speech therapy or continues to need occupational therapy. The patient is under my care, and I have initiated the establishment of the plan of care. This patient will be followed by a physician who will periodically review the plan of care.         Collection Information     Consult Order Info     ID Description Priority Start Date Start Time   [id number] Home Health face-to-face (FTF) Encounter Routine 04/04/2018 7:10 PM   Provider  Specialty Referred to   ______________________________________ _____________________________________   Acknowledgement Info     For At Acknowledged By Acknowledged On   Placing Order 04/04/18 1915 Orpah Melter, RN 04/04/18 1915   Verbal Order Info     Action Created on Order Mode Entered by Responsible Provider Signed by Signed on   Ordering 04/04/18 1915 Verbal with Elna Breslow, RN Guadalupe Maple, MD Guadalupe Maple, MD 04/04/18 [phone]   Patient Information     Patient Name  Rachael [name], Rachael [name] Sex  Female DOB  11/11/1936   Additional Information     Associated Reports External References   Priority and Order Details [employer]       [facility] REFERRAL       Patient's Name: Rachael [name], Rachael [name] [facility] Date: 04/06/2018   DOB: 05-22-1936 From: [facility]  At: [phone]   MRN: [id number] For Physician: Kennon Rounds, MD   Attending Physician: No att. providers found For (PT, OT, SLP, etc.): SN HHA     Language/Communication Needs:   na     Acuity:   1    Isolation Precautions:   No active isolations      Diagnoses:     Patient Active Problem List    Diagnosis Date Noted   . Constipation, unspecified constipation type [K59.00]    . DOE (dyspnea on exertion) [R06.09] 07/20/2017   . Generalized weakness [R53.1] 02/04/2017   . Hypoxia [R09.02] 01/07/2017   . Hyponatremia [E87.1] 01/07/2017   . Leukocytosis [D72.829] 01/07/2017   . Anxiety [F41.9] 01/07/2017     Chronic   . GERD (gastroesophageal reflux disease) [K21.9] 01/07/2017     Chronic   . HLD (hyperlipidemia) [E78.5] 01/07/2017     Chronic   . HTN (hypertension) [I10] 01/07/2017     Chronic   . Hypothyroid [E03.9] 01/07/2017     Chronic   . PMR (polymyalgia rheumatica) [M35.3] 01/07/2017     Chronic   . OSA (obstructive sleep apnea) [G47.33] 01/07/2017     Chronic   . Diabetes mellitus, type II [E11.9] 01/07/2017     Chronic   . Sepsis, due to unspecified organism [A41.9]    . Acute bronchitis due to Rhinovirus  [J20.6] 03/29/2016   . Chronic obstructive pulmonary disease with (acute) exacerbation [J44.1] 03/25/2016   . Acute respiratory failure with hypoxia [J96.01] 03/25/2016   . Lactic acidosis [E87.2] 03/25/2016       Admission Date:   04/04/2018    Discharge Date:   04/04/2018    Recent Surgery, Date of Surgery, and Provider Performing:       *  Surgery not found *    Allergies:     Allergies   Allergen Reactions   . Tolectin [Tolmetin] Swelling   . Lyrica [Pregabalin]    . Nsaids    . Latex Itching           Height and Weight:     Ht Readings from Last 1 Encounters:   04/04/18 1.499 m (4\' 11" )     Wt Readings from Last 1 Encounters:   04/04/18 85.3 kg (188 lb)       Immunizations:     There is no immunization history on file for this patient.    Past Medical History:        Past Medical History:   Diagnosis Date   . Anemia    . Anxiety    . Arthritis    . 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 Chronic cholecystitis with calculus 10/31/2015   . Chronic obstructive pulmonary disease    . Gastroesophageal reflux disease    . Hiatal hernia    . Hyperlipidemia    . Hypertension    . Hypothyroidism    . Incontinent of urine 2014    -WEARS DIAPERS-WILL BRING HER OWN   . Low back pain    . Lymphedema of right lower extremity    . PMR (polymyalgia rheumatica) 01/07/2017   . Polymyalgia rheumatica 09/2014   . Sleep apnea     USES CPAP NIGHTLY   . Type 2 diabetes mellitus, controlled    . Type II or unspecified type diabetes mellitus without mention of complication, not stated as uncontrolled    . Venous insufficiency of right leg 2016    also has lymphedema-KEEPS HER LEGS WRAPPED EXCEPT FOR WHEN SHE BATHES       Past Surgical History:     Past Surgical History:   Procedure Laterality Date   . BACK SURGERY  2014   . CARPAL TUNNEL RELEASE Bilateral 1978   . CLOSED REDUCTION SHOULDER DISLOCATION     . HYSTERECTOMY  1985   . RIGHT & LEFT HEART CATH  W/ CORONARY ANGIOS, LV/LA Bilateral 01/26/2018    Procedure: Right &  Left Heart Cath  w/ Coronary Angios, LV/LA;  Surgeon: Elbert Ewings, MD;  [facility];  Service: Cardiovascular;  Laterality: Bilateral;   . ROBOTIC, CHOLECYSTECTOMY N/A 10/31/2015    Procedure: ROBOT ASSISTED, LAPAROSCOPIC, CHOLECYSTECTOMY, UMBILICAL HERNIA REPAIR;  Surgeon: Alen Blew, MD;  Location: [facility];  Service: General;  Laterality: N/A;       Social History:     Social History     Social History   . Marital status: Widowed     Spouse name: N/A   . Number of children: N/A   . Years of education: N/A     Social History Main Topics   . Smoking status: Former Smoker     Packs/day: 1.00     Years: 25.00     Quit date: 11/16/1998   . Smokeless tobacco: Never Used   . Alcohol use 1.8 oz/week     3 Glasses of wine per week      Comment: social   . Drug use: No   . Sexual activity: Not on file     Other Topics Concern   . Not on file     Social History Narrative   . No narrative on file       Willing  and Able Caregiver:    UNKNOWN    SKILLED NURSING (SN): Skilled assessment and medication instruction   Rachael [name] is a 82 y.o. female  Elevators closed in on bilateral legs, her legs started to swell has history of lymphadema legs are swelling today.  Incident happened last Thursday.   R leg wound 4 days ago.  Lives in assisted living facility, [facility].  Sent here for further wound management. denies loss of rom/anticoag meds/fever/discharge/calf pain/cp/sob.    HOME HEALTH AIDE [facility]):   Personal Care/ Assist with ADL's    Additional Notes:   Patient Information            Home Address: [address] [place],  [place]  [place] [address] Employer:  Employer Address: retired    ,     Main Phone: [phone] Employer Phone:    SSN: [id number]     DOB: July 11, 1936 (81 yrs)     Sex: Female Primary Care Physician: Kerin Perna, DO   Marital Status: Widowed Referring Physician:       No ref. provider found   Race: White or Caucasian     Ethnicity: Non  Hispanic/Latino     Emergency Contacts  Name Home Phone Work Phone Mobile Phone Relationship Christella Hartigan   [facility] [phone]  [phone] Daughter    Elyn Peers   [phone] Son         Guarantor Information    Guarantor Name: DAVIONNE, MASTRANGELO Guarantor ID: [id number]   Guarantor Relationship to Pt: Self Guarantor Type: Personal/Family   Guarantor DOB:   June 06, 1936     Guarantor Address: [address] [place], [address]  [place], [place] [address]       Guarantor Home Phone: [phone] Guarantor Employer:     retired   Pensions consultant Work Phone:  Pensions consultant Emp Phone:               [employer] Name: MEDICARE HMO-UHC MED ADV [id number] Subscriber Name: [employer]   Insurance Address:   [address] [address]  [place], [place] [address] Subscriber DOB: 02/24/1936     Subscriber ID: [id number]   Insurance Phone: [phone] Pt Relationship to Sub:   Self   Insurance ID:      Group Name:  Preauthorization #:    Group #: [id number] Preauthorization Days:      Signed by: Levonne Hubert, RN  Date/Time: 04/05/2018, 9:15 AM

## 2018-04-05 NOTE — Progress Notes (Signed)
Home Health Referral        By [employer], the patient has the right to freely choose a home care provider.  Arrangements have been made with:     A company of the patients choosing. We have supplied the patient with a listing of providers in your area who asked to be included and participate in Medicare.   The preferred provider of your insurance company. Choosing a home care provider other than your insurance company's preferred provider may affect your insurance coverage.    Home Health Discharge Information     Your doctor has ordered Skilled Nursing and Home Health Aide in-home service(s) for you while you recuperate at home, to assist you in the transition from hospital to home.      The agency that you or your representative chose to provide the service:  Name of Home Health Agency Placement: [facility]] [phone]    The above services were set up by:  Levonne Hubert   RN, BSN(Home Health Liaison)   Phone       [phone]                                    Additional comments: If you have not heard from your home health agency within 24-48 hours after discharge please call your agency to arrange a time for your first visit.  For any scheduling concerns or questions related to home health, such as time or date please contact your home health agency at the number listed above.     Home Health face-to-face (FTF) Encounter (Order [id number])   Consult   Date: 04/04/2018 Department: Alicia Amel Angelique Holm Ordering/Authorizing: Guadalupe Maple, MD   Order Information     Order Date/Time Release Date/Time Start Date/Time End Date/Time   04/04/18 07:15 PM None 04/04/18 07:10 PM 04/04/18 07:10 PM   Order Details     Frequency Duration Priority Order Class   Once 1 occurrence Routine Hospital Performed   Standing Order Information     Remaining Occurrences Interval Last Released      0/1 Once 04/04/2018            Provider Information     Ordering User Ordering Provider  Authorizing Provider   Orpah Melter, RN Guadalupe Maple, MD Guadalupe Maple, MD   Attending Provider(s) PCP   Guadalupe Maple, MD Kerin Perna, DO   Verbal Order Info     Action Created on Order Mode Entered by Responsible Provider Signed by Signed on   Ordering 04/04/18 1915 Verbal with Elna Breslow, RN Guadalupe Maple, MD Guadalupe Maple, MD 04/04/18 [phone]   Order Questions     Question Answer Comment   Date of face-to-face (FTF) encounter: 04/04/2018    Medical conditions that necessitate Home Health care: J. Wound requiring assessment, monitoring and care    Clinical findings that support the need for Skilled Nursing. SN will: K. Educate on complex wound care and management    Clinical findings that support the need for Physical Therapy. PT will K. N/A    Clinical findings support the need for OT (needs SN/PT order).OT will F. N/A    Clinical findings that support the need for SLP. ST will F. N/A    Per clinical findings, following services are medically necessary: Skilled Nursing    Evidence this patient is homebound because: N.  Impaired mobility d/t pain, arthritis, weakness that compromises patient safety    Other (please specify) patient needs wound care, has [facility] in past.          Process Instructions     HOME HEALTH AIDE    Please select Home Care Services medically necessary.     Based on the above findings, I certify that this patient is confined to the home and needs intermittent skilled nursing care, physical therapry and / or speech therapy or continues to need occupational therapy. The patient is under my care, and I have initiated the establishment of the plan of care. This patient will be followed by a physician who will periodically review the plan of care.         Collection Information     Consult Order Info     ID Description Priority Start Date Start Time   [id number] Home Health face-to-face (FTF)  Encounter Routine 04/04/2018 7:10 PM   Provider Specialty Referred to   ______________________________________ _____________________________________   Acknowledgement Info     For At Acknowledged By Acknowledged On   Placing Order 04/04/18 1915 Orpah Melter, RN 04/04/18 1915   Verbal Order Info     Action Created on Order Mode Entered by Responsible Provider Signed by Signed on   Ordering 04/04/18 1915 Verbal with Elna Breslow, RN Guadalupe Maple, MD Guadalupe Maple, MD 04/04/18 [id number]   Patient Information     Patient Name  Rachael [name], Rachael [name] Sex  Female DOB  Aug 12, 1936   Additional Information     Associated Reports External References   Priority and Order Details [employer]       [facility] REFERRAL       Patient's Name: Rachael [name], Rachael [name] [facility] Date: 04/06/2018   DOB: [birth year] From: Rachael [name]  At: [phone]   MRN: [id number] For Physician: Kennon Rounds, MD   Attending Physician: No att. providers found For (PT, OT, SLP, etc.): SN HHA     Language/Communication Needs:   na     Acuity:   1    Isolation Precautions:   No active isolations      Diagnoses:     Patient Active Problem List    Diagnosis Date Noted   . Constipation, unspecified constipation type [K59.00]    . DOE (dyspnea on exertion) [R06.09] 07/20/2017   . Generalized weakness [R53.1] 02/04/2017   . Hypoxia [R09.02] 01/07/2017   . Hyponatremia [E87.1] 01/07/2017   . Leukocytosis [D72.829] 01/07/2017   . Anxiety [F41.9] 01/07/2017     Chronic   . GERD (gastroesophageal reflux disease) [K21.9] 01/07/2017     Chronic   . HLD (hyperlipidemia) [E78.5] 01/07/2017     Chronic   . HTN (hypertension) [I10] 01/07/2017     Chronic   . Hypothyroid [E03.9] 01/07/2017     Chronic   . PMR (polymyalgia rheumatica) [M35.3] 01/07/2017     Chronic   . OSA (obstructive sleep apnea) [G47.33] 01/07/2017     Chronic   . Diabetes mellitus, type II [E11.9] 01/07/2017     Chronic   . Sepsis, due to unspecified organism  [A41.9]    . Acute bronchitis due to Rhinovirus [J20.6] 03/29/2016   . Chronic obstructive pulmonary disease with (acute) exacerbation [J44.1] 03/25/2016   . Acute respiratory failure with hypoxia [J96.01] 03/25/2016   . Lactic acidosis [E87.2] 03/25/2016       Admission Date:   04/04/2018    Discharge Date:   04/04/2018  Recent Surgery, Date of Surgery, and Provider Performing:       * Surgery not found *    Allergies:     Allergies   Allergen Reactions   . Tolectin [Tolmetin] Swelling   . Lyrica [Pregabalin]    . Nsaids    . Latex Itching           Height and Weight:     Ht Readings from Last 1 Encounters:   04/04/18 1.499 m (4\' 11" )     Wt Readings from Last 1 Encounters:   04/04/18 85.3 kg (188 lb)       Immunizations:     There is no immunization history on file for this patient.    Past Medical History:        Past Medical History:   Diagnosis Date   . Anemia    . Anxiety    . Arthritis    . 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 Chronic cholecystitis with calculus 10/31/2015   . Chronic obstructive pulmonary disease    . Gastroesophageal reflux disease    . Hiatal hernia    . Hyperlipidemia    . Hypertension    . Hypothyroidism    . Incontinent of urine 2014    -WEARS DIAPERS-WILL BRING HER OWN   . Low back pain    . Lymphedema of right lower extremity    . PMR (polymyalgia rheumatica) 01/07/2017   . Polymyalgia rheumatica 09/2014   . Sleep apnea     USES CPAP NIGHTLY   . Type 2 diabetes mellitus, controlled    . Type II or unspecified type diabetes mellitus without mention of complication, not stated as uncontrolled    . Venous insufficiency of right leg 2016    also has lymphedema-KEEPS HER LEGS WRAPPED EXCEPT FOR WHEN SHE BATHES       Past Surgical History:     Past Surgical History:   Procedure Laterality Date   . BACK SURGERY  2014   . CARPAL TUNNEL RELEASE Bilateral 1978   . CLOSED REDUCTION SHOULDER DISLOCATION     . HYSTERECTOMY  1985   . RIGHT & LEFT HEART CATH  W/ CORONARY ANGIOS,  LV/LA Bilateral 01/26/2018    Procedure: Right & Left Heart Cath  w/ Coronary Angios, LV/LA;  Surgeon: Elbert Ewings, MD;  [facility];  Service: Cardiovascular;  Laterality: Bilateral;   . ROBOTIC, CHOLECYSTECTOMY N/A 10/31/2015    Procedure: ROBOT ASSISTED, LAPAROSCOPIC, CHOLECYSTECTOMY, UMBILICAL HERNIA REPAIR;  Surgeon: Alen Blew, MD;  Location: [facility] OR;  Service: General;  Laterality: N/A;       Social History:     Social History     Social History   . Marital status: Widowed     Spouse name: N/A   . Number of children: N/A   . Years of education: N/A     Social History Main Topics   . Smoking status: Former Smoker     Packs/day: 1.00     Years: 25.00     Quit date: 11/16/1998   . Smokeless tobacco: Never Used   . Alcohol use 1.8 oz/week     3 Glasses of wine per week      Comment: social   . Drug use: No   . Sexual activity: Not on file     Other Topics Concern   . Not on file     Social History  Narrative   . No narrative on file       Willing and Able Caregiver:    UNKNOWN    SKILLED NURSING (SN): Skilled assessment and medication instruction   Rachael [name] is a 82 y.o. female  Elevators closed in on bilateral legs, her legs started to swell has history of lymphadema legs are swelling today.  Incident happened last Thursday.   R leg wound 4 days ago.  Lives in assisted living facility, [facility].  Sent here for further wound management. denies loss of rom/anticoag meds/fever/discharge/calf pain/cp/sob.    HOME HEALTH AIDE [facility]):   Personal Care/ Assist with ADL's    Additional Notes:   Patient Information            Home Address: [address] [place],  [place]  [place] [address] Employer:  Employer Address: retired    ,     Main Phone: [phone] Employer Phone:    SSN: [id number]     DOB: 11/17/1935 (81 yrs)     Sex: Female Primary Care Physician: Kerin Perna, DO   Marital Status: Widowed Referring Physician:       No ref. provider found   Race:  White or Caucasian     Ethnicity: Non Hispanic/Latino     Emergency Contacts  Name Home Phone Work Phone Mobile Phone Relationship Christella Hartigan   [facility] [phone]  [phone] Daughter    Elyn Peers   [phone] Son         Guarantor Information    Guarantor Name: Rachael [name], Rachael [name] Guarantor ID: [id number]   Guarantor Relationship to Pt: Self Guarantor Type: Personal/Family   Guarantor DOB:   1936-11-14     Guarantor Address: [address] [place], [address]  [place], [place] [address]       Guarantor Home Phone: [phone] Guarantor Employer:     retired   Pensions consultant Work Phone:  Pensions consultant Emp Phone:               [employer] Name: MEDICARE HMO-UHC MED ADV 60630 Subscriber Name: [employer]   Insurance Address:   [address] [address]  [place], [place] [address] Subscriber DOB: 03-18-1936     Subscriber ID: [id number]   Insurance Phone: [phone] Pt Relationship to Sub:   Self   Insurance ID:      Group Name:  Preauthorization #:    Group #: [id number] Preauthorization Days:      Signed by: Levonne Hubert, RN  Date/Time: 04/05/2018, 9:00 AM

## 2018-04-05 NOTE — Progress Notes (Addendum)
Home Health Referral        By [employer], the patient has the right to freely choose a home care provider.  Arrangements have been made with:     A company of the patients choosing. We have supplied the patient with a listing of providers in your area who asked to be included and participate in Medicare.   The preferred provider of your insurance company. Choosing a home care provider other than your insurance company's preferred provider may affect your insurance coverage.    Home Health Discharge Information     Your doctor has ordered Skilled Nursing in-home service(s) for you while you recuperate at home, to assist you in the transition from hospital to home.      The agency that you or your representative chose to provide the service:  Name of Home Health Agency Placement: [facility] [phone]    The above services were set up by:  Levonne Hubert   RN, BSN(Home Health Liaison)   Phone       [phone]                                    Additional comments: If you have not heard from your home health agency within 24-48 hours after discharge please call your agency to arrange a time for your first visit.  For any scheduling concerns or questions related to home health, such as time or date please contact your home health agency at the number listed above.     Home Health face-to-face (FTF) Encounter (Order [id number])   Consult   Date: 04/04/2018 Department: Alicia Amel Angelique Holm Ordering/Authorizing: Guadalupe Maple, MD   Order Information     Order Date/Time Release Date/Time Start Date/Time End Date/Time   04/04/18 07:15 PM None 04/04/18 07:10 PM 04/04/18 07:10 PM   Order Details     Frequency Duration Priority Order Class   Once 1 occurrence Routine Hospital Performed   Standing Order Information     Remaining Occurrences Interval Last Released      0/1 Once 04/04/2018            Provider Information     Ordering User Ordering Provider Authorizing Provider   Orpah Melter, RN  Guadalupe Maple, MD Guadalupe Maple, MD   Attending Provider(s) PCP   Guadalupe Maple, MD Kerin Perna, DO   Verbal Order Info     Action Created on Order Mode Entered by Responsible Provider Signed by Signed on   Ordering 04/04/18 1915 Verbal with Elna Breslow, RN Guadalupe Maple, MD Guadalupe Maple, MD 04/04/18 [phone]   Order Questions     Question Answer Comment   Date of face-to-face (FTF) encounter: 04/04/2018    Medical conditions that necessitate Home Health care: J. Wound requiring assessment, monitoring and care    Clinical findings that support the need for Skilled Nursing. SN will: K. Educate on complex wound care and management    Clinical findings that support the need for Physical Therapy. PT will K. N/A    Clinical findings support the need for OT (needs SN/PT order).OT will F. N/A    Clinical findings that support the need for SLP. ST will F. N/A    Per clinical findings, following services are medically necessary: Skilled Nursing    Evidence this patient is homebound because: N. Impaired mobility d/t pain, arthritis,  weakness that compromises patient safety    Other (please specify) patient needs wound care, has [facility] in past.          Process Instructions  Please select Home Care Services medically necessary.     Based on the above findings, I certify that this patient is confined to the home and needs intermittent skilled nursing care, physical therapry and / or speech therapy or continues to need occupational therapy. The patient is under my care, and I have initiated the establishment of the plan of care. This patient will be followed by a physician who will periodically review the plan of care.         Collection Information     Consult Order Info     ID Description Priority Start Date Start Time   [id number] Home Health face-to-face (FTF) Encounter Routine 04/04/2018 7:10 PM   Provider Specialty Referred  to   ______________________________________ _____________________________________   Acknowledgement Info     For At Acknowledged By Acknowledged On   Placing Order 04/04/18 1915 Orpah Melter, RN 04/04/18 1915   Verbal Order Info     Action Created on Order Mode Entered by Responsible Provider Signed by Signed on   Ordering 04/04/18 1915 Verbal with Elna Breslow, RN Guadalupe Maple, MD Guadalupe Maple, MD 04/04/18 [phone]   Patient Information     Patient Name  Roel, Kathryne Hitch Sex  Female DOB  1936-01-23   Additional Information     Associated Reports External References   Priority and Order Details [facility]       [facility] REFERRAL       Patient's Name: DEVANY, AJA [facility] Date: 04/06/2018   DOB: 1936/03/04 From: Marylene Land Dillan Candela/Dent  At: [phone]   MRN: [id number] For Physician: Kennon Rounds, MD   Attending Physician: No att. providers found For (PT, OT, SLP, etc.): SN HHA     Language/Communication Needs:   na     Acuity:   1    Isolation Precautions:   No active isolations      Diagnoses:     Patient Active Problem List    Diagnosis Date Noted   . Constipation, unspecified constipation type [K59.00]    . DOE (dyspnea on exertion) [R06.09] 07/20/2017   . Generalized weakness [R53.1] 02/04/2017   . Hypoxia [R09.02] 01/07/2017   . Hyponatremia [E87.1] 01/07/2017   . Leukocytosis [D72.829] 01/07/2017   . Anxiety [F41.9] 01/07/2017     Chronic   . GERD (gastroesophageal reflux disease) [K21.9] 01/07/2017     Chronic   . HLD (hyperlipidemia) [E78.5] 01/07/2017     Chronic   . HTN (hypertension) [I10] 01/07/2017     Chronic   . Hypothyroid [E03.9] 01/07/2017     Chronic   . PMR (polymyalgia rheumatica) [M35.3] 01/07/2017     Chronic   . OSA (obstructive sleep apnea) [G47.33] 01/07/2017     Chronic   . Diabetes mellitus, type II [E11.9] 01/07/2017     Chronic   . Sepsis, due to unspecified organism [A41.9]    . Acute bronchitis due to Rhinovirus [J20.6] 03/29/2016    . Chronic obstructive pulmonary disease with (acute) exacerbation [J44.1] 03/25/2016   . Acute respiratory failure with hypoxia [J96.01] 03/25/2016   . Lactic acidosis [E87.2] 03/25/2016       Admission Date:   04/04/2018    Discharge Date:   04/04/2018    Recent Surgery, Date of Surgery, and Provider Performing:       *  Surgery not found *    Allergies:     Allergies   Allergen Reactions   . Tolectin [Tolmetin] Swelling   . Lyrica [Pregabalin]    . Nsaids    . Latex Itching           Height and Weight:     Ht Readings from Last 1 Encounters:   04/04/18 1.499 m (4\' 11" )     Wt Readings from Last 1 Encounters:   04/04/18 85.3 kg (188 lb)       Immunizations:     There is no immunization history on file for this patient.    Past Medical History:        Past Medical History:   Diagnosis Date   . Anemia    . Anxiety    . Arthritis    . 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 Chronic cholecystitis with calculus 10/31/2015   . Chronic obstructive pulmonary disease    . Gastroesophageal reflux disease    . Hiatal hernia    . Hyperlipidemia    . Hypertension    . Hypothyroidism    . Incontinent of urine 2014    -WEARS DIAPERS-WILL BRING HER OWN   . Low back pain    . Lymphedema of right lower extremity    . PMR (polymyalgia rheumatica) 01/07/2017   . Polymyalgia rheumatica 09/2014   . Sleep apnea     USES CPAP NIGHTLY   . Type 2 diabetes mellitus, controlled    . Type II or unspecified type diabetes mellitus without mention of complication, not stated as uncontrolled    . Venous insufficiency of right leg 2016    also has lymphedema-KEEPS HER LEGS WRAPPED EXCEPT FOR WHEN SHE BATHES       Past Surgical History:     Past Surgical History:   Procedure Laterality Date   . BACK SURGERY  2014   . CARPAL TUNNEL RELEASE Bilateral 1978   . CLOSED REDUCTION SHOULDER DISLOCATION     . HYSTERECTOMY  1985   . RIGHT & LEFT HEART CATH  W/ CORONARY ANGIOS, LV/LA Bilateral 01/26/2018    Procedure: Right & Left Heart Cath  w/  Coronary Angios, LV/LA;  Surgeon: Elbert Ewings, MD;  [facility];  Service: Cardiovascular;  Laterality: Bilateral;   . ROBOTIC, CHOLECYSTECTOMY N/A 10/31/2015    Procedure: ROBOT ASSISTED, LAPAROSCOPIC, CHOLECYSTECTOMY, UMBILICAL HERNIA REPAIR;  Surgeon: Alen Blew, MD;  Location: [facility];  Service: General;  Laterality: N/A;       Social History:     Social History     Social History   . Marital status: Widowed     Spouse name: N/A   . Number of children: N/A   . Years of education: N/A     Social History Main Topics   . Smoking status: Former Smoker     Packs/day: 1.00     Years: 25.00     Quit date: 11/16/1998   . Smokeless tobacco: Never Used   . Alcohol use 1.8 oz/week     3 Glasses of wine per week      Comment: social   . Drug use: No   . Sexual activity: Not on file     Other Topics Concern   . Not on file     Social History Narrative   . No narrative on file       Willing  and Able Caregiver:    UNKNOWN    SKILLED NURSING (SN): Skilled assessment and medication instruction   Malory Fatisha Rabalais is a 82 y.o. female  Elevators closed in on bilateral legs, her legs started to swell has history of lymphadema legs are swelling today.  Incident happened last Thursday.   R leg wound 4 days ago.  Lives in assisted living facility, [facility].  Sent here for further wound management. denies loss of rom/anticoag meds/fever/discharge/calf pain/cp/sob.    HOME HEALTH AIDE [facility]):   Personal Care/ Assist with ADL's    Additional Notes:   Patient Information            Home Address: [address] [place],  [place]  [place] [address] Employer:  Employer Address: retired    ,     Main Phone: [phone] Employer Phone:    SSN: [id number]     DOB: [birth year] (81 yrs)     Sex: Female Primary Care Physician: Kerin Perna, DO   Marital Status: Widowed Referring Physician:       No ref. provider found   Race: White or Caucasian     Ethnicity: Non Hispanic/Latino     Emergency  Contacts  Name Home Phone Work Phone Mobile Phone Relationship Christella Hartigan   [facility] [phone]  [phone] Daughter    Elyn Peers   [phone] Son         Guarantor Information    Guarantor Name: NALDA, SHACKLEFORD Guarantor ID: [id number]   Guarantor Relationship to Pt: Self Guarantor Type: Personal/Family   Guarantor DOB:   08-24-1936     Guarantor Address: [address] [place], [address]  [place], [place] [address]       Guarantor Home Phone: [phone] Guarantor Employer:     retired   Pensions consultant Work Phone:  Pensions consultant Emp Phone:               [employer] Name: MEDICARE HMO-UHC MED ADV 60109 Subscriber Name: [employer]   Insurance Address:   [address] [address]  [place], [place] [address] Subscriber DOB: 1936/01/11     Subscriber ID: [id number]   Insurance Phone: [phone] Pt Relationship to Sub:   Self   Insurance ID:      Group Name:  Preauthorization #:    Group #: [id number] Preauthorization Days:      Signed by: Levonne Hubert, RN  Date/Time: 04/05/2018, 9:59 AM

## 2018-04-12 ENCOUNTER — Telehealth: Payer: Self-pay

## 2018-04-12 NOTE — Telephone Encounter (Signed)
Received voice mail from this patient's daughter, Lanora Manis, and returned her call this morning. Patient missed last appointment and daughter cancelled the patient's appointment scheduled for this week secondary to traumatic injury to patient's legs via facility elevator door closing on them. Unfortunately, this has resulted in mechanical injury to leg tissues which had necessitated ESD visit and current need for home health wound services. Patient will schedule a follow-up visit for reassessment upon discharge from home health services.

## 2018-04-14 ENCOUNTER — Ambulatory Visit: Payer: No Typology Code available for payment source

## 2018-04-20 ENCOUNTER — Inpatient Hospital Stay
Admission: EM | Admit: 2018-04-20 | Discharge: 2018-04-24 | DRG: 293 | Disposition: A | Discharge status: Home / Self Care | Payer: No Typology Code available for payment source | Attending: Internal Medicine | Admitting: Internal Medicine

## 2018-04-20 ENCOUNTER — Emergency Department: Payer: No Typology Code available for payment source

## 2018-04-20 DIAGNOSIS — G8929 Other chronic pain: Secondary | ICD-10-CM | POA: Diagnosis present

## 2018-04-20 DIAGNOSIS — I890 Lymphedema, not elsewhere classified: Secondary | ICD-10-CM | POA: Diagnosis present

## 2018-04-20 DIAGNOSIS — D72829 Elevated white blood cell count, unspecified: Secondary | ICD-10-CM | POA: Diagnosis present

## 2018-04-20 DIAGNOSIS — Z7982 Long term (current) use of aspirin: Secondary | ICD-10-CM

## 2018-04-20 DIAGNOSIS — E785 Hyperlipidemia, unspecified: Secondary | ICD-10-CM | POA: Diagnosis present

## 2018-04-20 DIAGNOSIS — M353 Polymyalgia rheumatica: Secondary | ICD-10-CM | POA: Diagnosis present

## 2018-04-20 DIAGNOSIS — Z9049 Acquired absence of other specified parts of digestive tract: Secondary | ICD-10-CM

## 2018-04-20 DIAGNOSIS — E119 Type 2 diabetes mellitus without complications: Secondary | ICD-10-CM | POA: Diagnosis present

## 2018-04-20 DIAGNOSIS — R079 Chest pain, unspecified: Secondary | ICD-10-CM | POA: Diagnosis present

## 2018-04-20 DIAGNOSIS — I2510 Atherosclerotic heart disease of native coronary artery without angina pectoris: Secondary | ICD-10-CM | POA: Diagnosis present

## 2018-04-20 DIAGNOSIS — K219 Gastro-esophageal reflux disease without esophagitis: Secondary | ICD-10-CM | POA: Diagnosis present

## 2018-04-20 DIAGNOSIS — M545 Low back pain: Secondary | ICD-10-CM | POA: Diagnosis present

## 2018-04-20 DIAGNOSIS — I878 Other specified disorders of veins: Secondary | ICD-10-CM | POA: Diagnosis present

## 2018-04-20 DIAGNOSIS — Z9181 History of falling: Secondary | ICD-10-CM

## 2018-04-20 DIAGNOSIS — J449 Chronic obstructive pulmonary disease, unspecified: Secondary | ICD-10-CM | POA: Diagnosis present

## 2018-04-20 DIAGNOSIS — J9600 Acute respiratory failure, unspecified whether with hypoxia or hypercapnia: Secondary | ICD-10-CM | POA: Diagnosis present

## 2018-04-20 DIAGNOSIS — Z87891 Personal history of nicotine dependence: Secondary | ICD-10-CM

## 2018-04-20 DIAGNOSIS — M25512 Pain in left shoulder: Secondary | ICD-10-CM | POA: Diagnosis present

## 2018-04-20 DIAGNOSIS — G4733 Obstructive sleep apnea (adult) (pediatric): Secondary | ICD-10-CM | POA: Diagnosis present

## 2018-04-20 DIAGNOSIS — I5033 Acute on chronic diastolic (congestive) heart failure: Secondary | ICD-10-CM | POA: Diagnosis present

## 2018-04-20 DIAGNOSIS — E876 Hypokalemia: Secondary | ICD-10-CM | POA: Diagnosis present

## 2018-04-20 DIAGNOSIS — R0602 Shortness of breath: Secondary | ICD-10-CM | POA: Diagnosis present

## 2018-04-20 DIAGNOSIS — Z79890 Hormone replacement therapy (postmenopausal): Secondary | ICD-10-CM

## 2018-04-20 DIAGNOSIS — D649 Anemia, unspecified: Secondary | ICD-10-CM | POA: Diagnosis present

## 2018-04-20 DIAGNOSIS — H5462 Unqualified visual loss, left eye, normal vision right eye: Secondary | ICD-10-CM | POA: Diagnosis present

## 2018-04-20 DIAGNOSIS — I272 Pulmonary hypertension, unspecified: Secondary | ICD-10-CM | POA: Diagnosis present

## 2018-04-20 DIAGNOSIS — Z90710 Acquired absence of both cervix and uterus: Secondary | ICD-10-CM

## 2018-04-20 DIAGNOSIS — Z794 Long term (current) use of insulin: Secondary | ICD-10-CM

## 2018-04-20 DIAGNOSIS — F419 Anxiety disorder, unspecified: Secondary | ICD-10-CM | POA: Diagnosis present

## 2018-04-20 DIAGNOSIS — I110 Hypertensive heart disease with heart failure: Principal | ICD-10-CM | POA: Diagnosis present

## 2018-04-20 DIAGNOSIS — Z7951 Long term (current) use of inhaled steroids: Secondary | ICD-10-CM

## 2018-04-20 DIAGNOSIS — E039 Hypothyroidism, unspecified: Secondary | ICD-10-CM | POA: Diagnosis present

## 2018-04-20 LAB — COMPREHENSIVE METABOLIC PANEL
ALT: 12 U/L (ref 0–55)
AST (SGOT): 20 U/L (ref 5–34)
Albumin/Globulin Ratio: 1.1 (ref 0.9–2.2)
Albumin: 3.7 g/dL (ref 3.5–5.0)
Alkaline Phosphatase: 80 U/L (ref 37–106)
Anion Gap: 17 — ABNORMAL HIGH (ref 5.0–15.0)
BUN: 17.7 mg/dL (ref 7.0–19.0)
Bilirubin, Total: 0.3 mg/dL (ref 0.2–1.2)
CO2: 27 mEq/L (ref 22–29)
Calcium: 9.1 mg/dL (ref 7.9–10.2)
Chloride: 98 mEq/L — ABNORMAL LOW (ref 100–111)
Creatinine: 0.9 mg/dL (ref 0.6–1.0)
Globulin: 3.4 g/dL (ref 2.0–3.6)
Glucose: 185 mg/dL — ABNORMAL HIGH (ref 70–100)
Potassium: 4.3 mEq/L (ref 3.5–5.1)
Protein, Total: 7.1 g/dL (ref 6.0–8.3)
Sodium: 142 mEq/L (ref 136–145)

## 2018-04-20 LAB — CBC AND DIFFERENTIAL
Absolute NRBC: 0 10*3/uL (ref 0.00–0.00)
Basophils Absolute Automated: 0.01 10*3/uL (ref 0.00–0.08)
Basophils Automated: 0.1 %
Eosinophils Absolute Automated: 0 10*3/uL (ref 0.00–0.44)
Eosinophils Automated: 0 %
Hematocrit: 32.2 % — ABNORMAL LOW (ref 34.7–43.7)
Hgb: 9.1 g/dL — ABNORMAL LOW (ref 11.4–14.8)
Immature Granulocytes Absolute: 0.18 10*3/uL — ABNORMAL HIGH (ref 0.00–0.07)
Immature Granulocytes: 1.3 %
Lymphocytes Absolute Automated: 0.94 10*3/uL (ref 0.42–3.22)
Lymphocytes Automated: 6.8 %
MCH: 21.4 pg — ABNORMAL LOW (ref 25.1–33.5)
MCHC: 28.3 g/dL — ABNORMAL LOW (ref 31.5–35.8)
MCV: 75.6 fL — ABNORMAL LOW (ref 78.0–96.0)
MPV: 9.1 fL (ref 8.9–12.5)
Monocytes Absolute Automated: 0.68 10*3/uL (ref 0.21–0.85)
Monocytes: 4.9 %
Neutrophils Absolute: 11.96 10*3/uL — ABNORMAL HIGH (ref 1.10–6.33)
Neutrophils: 86.9 %
Nucleated RBC: 0 /100 WBC (ref 0.0–0.0)
Platelets: 486 10*3/uL — ABNORMAL HIGH (ref 142–346)
RBC: 4.26 10*6/uL (ref 3.90–5.10)
RDW: 20 % — ABNORMAL HIGH (ref 11–15)
WBC: 13.77 10*3/uL — ABNORMAL HIGH (ref 3.10–9.50)

## 2018-04-20 LAB — GFR: EGFR: >60

## 2018-04-20 LAB — TROPONIN I: Troponin I: <0.01 ng/mL (ref 0.00–0.09)

## 2018-04-20 LAB — IHS D-DIMER: D-Dimer: 0.82 ug/mL FEU — ABNORMAL HIGH (ref 0.00–0.70)

## 2018-04-20 LAB — TSH: TSH: 0.62 u[IU]/mL (ref 0.35–4.94)

## 2018-04-20 LAB — GLUCOSE WHOLE BLOOD - POCT: Whole Blood Glucose POCT: 202 mg/dL — ABNORMAL HIGH (ref 70–100)

## 2018-04-20 LAB — B-TYPE NATRIURETIC PEPTIDE: B-Natriuretic Peptide: 220.3 pg/mL — ABNORMAL HIGH (ref 0.0–100.0)

## 2018-04-20 MED ORDER — INSULIN LISPRO 100 UNIT/ML SC SOLN
1.00 [IU] | Freq: Every evening | SUBCUTANEOUS | Status: DC
Start: 2018-04-20 — End: 2018-04-24
  Administered 2018-04-20 – 2018-04-23 (×4): 1 [IU] via SUBCUTANEOUS
  Filled 2018-04-20 (×4): qty 3

## 2018-04-20 MED ORDER — TRAZODONE HCL 50 MG PO TABS
50.00 mg | ORAL_TABLET | Freq: Every evening | ORAL | Status: DC
Start: 2018-04-20 — End: 2018-04-24
  Administered 2018-04-20 – 2018-04-23 (×4): 50 mg via ORAL
  Filled 2018-04-20 (×4): qty 1

## 2018-04-20 MED ORDER — TIOTROPIUM BROMIDE MONOHYDRATE 18 MCG IN CAPS
18.00 ug | ORAL_CAPSULE | Freq: Every morning | RESPIRATORY_TRACT | Status: DC
Start: 2018-04-21 — End: 2018-04-24
  Administered 2018-04-21 – 2018-04-24 (×4): 18 ug via RESPIRATORY_TRACT
  Filled 2018-04-20: qty 5

## 2018-04-20 MED ORDER — HYDROCODONE-ACETAMINOPHEN 5-325 MG PO TABS
1.00 | ORAL_TABLET | Freq: Four times a day (QID) | ORAL | Status: DC
Start: 2018-04-20 — End: 2018-04-20

## 2018-04-20 MED ORDER — INSULIN GLARGINE 100 UNIT/ML SC SOLN
14.00 [IU] | Freq: Every evening | SUBCUTANEOUS | Status: DC
Start: 2018-04-20 — End: 2018-04-24
  Administered 2018-04-20 – 2018-04-23 (×4): 14 [IU] via SUBCUTANEOUS
  Filled 2018-04-20 (×4): qty 14

## 2018-04-20 MED ORDER — CETIRIZINE HCL 10 MG PO TABS
10.00 mg | ORAL_TABLET | Freq: Every day | ORAL | Status: DC
Start: 2018-04-21 — End: 2018-04-24
  Administered 2018-04-21 – 2018-04-24 (×4): 10 mg via ORAL
  Filled 2018-04-20 (×5): qty 1

## 2018-04-20 MED ORDER — ALPRAZOLAM 0.25 MG PO TABS
0.25 mg | ORAL_TABLET | Freq: Every evening | ORAL | Status: DC | PRN
Start: 2018-04-20 — End: 2018-04-24

## 2018-04-20 MED ORDER — FUROSEMIDE 10 MG/ML IJ SOLN
20.00 mg | Freq: Once | INTRAMUSCULAR | Status: AC
Start: 2018-04-20 — End: 2018-04-20
  Administered 2018-04-20: 21:00:00 20 mg via INTRAVENOUS
  Filled 2018-04-20: qty 4

## 2018-04-20 MED ORDER — IPRATROPIUM BROMIDE 0.02 % IN SOLN
0.50 mg | Freq: Once | RESPIRATORY_TRACT | Status: AC
Start: 2018-04-20 — End: 2018-04-20
  Administered 2018-04-20: 21:00:00 0.5 mg via RESPIRATORY_TRACT
  Filled 2018-04-20: qty 2.5

## 2018-04-20 MED ORDER — ALBUTEROL SULFATE (2.5 MG/3ML) 0.083% IN NEBU
2.50 mg | INHALATION_SOLUTION | Freq: Once | RESPIRATORY_TRACT | Status: AC
Start: 2018-04-20 — End: 2018-04-20
  Administered 2018-04-20: 21:00:00 2.5 mg via RESPIRATORY_TRACT
  Filled 2018-04-20: qty 3

## 2018-04-20 MED ORDER — DEXTROSE 50 % IV SOLN
12.50 g | INTRAVENOUS | Status: DC | PRN
Start: 2018-04-20 — End: 2018-04-24

## 2018-04-20 MED ORDER — INSULIN GLARGINE 100 UNIT/ML SC SOLN
28.00 [IU] | Freq: Every morning | SUBCUTANEOUS | Status: DC
Start: 2018-04-21 — End: 2018-04-20

## 2018-04-20 MED ORDER — INSULIN LISPRO 100 UNIT/ML SC SOLN
1.00 [IU] | Freq: Three times a day (TID) | SUBCUTANEOUS | Status: DC
Start: 2018-04-21 — End: 2018-04-24
  Administered 2018-04-21 (×2): 3 [IU] via SUBCUTANEOUS
  Administered 2018-04-21: 08:00:00 2 [IU] via SUBCUTANEOUS
  Administered 2018-04-23 (×2): 1 [IU] via SUBCUTANEOUS
  Filled 2018-04-20: qty 6
  Filled 2018-04-20: qty 9
  Filled 2018-04-20 (×2): qty 3
  Filled 2018-04-20: qty 9

## 2018-04-20 MED ORDER — ATORVASTATIN CALCIUM 20 MG PO TABS
20.00 mg | ORAL_TABLET | Freq: Every evening | ORAL | Status: DC
Start: 2018-04-20 — End: 2018-04-24
  Administered 2018-04-20 – 2018-04-23 (×4): 20 mg via ORAL
  Filled 2018-04-20 (×4): qty 1

## 2018-04-20 MED ORDER — HYDROCODONE-ACETAMINOPHEN 5-325 MG PO TABS
1.00 | ORAL_TABLET | Freq: Four times a day (QID) | ORAL | Status: DC | PRN
Start: 2018-04-20 — End: 2018-04-24
  Administered 2018-04-22 – 2018-04-24 (×5): 1 via ORAL
  Filled 2018-04-20 (×5): qty 1

## 2018-04-20 MED ORDER — OXYCODONE HCL ER 10 MG PO T12A
10.00 mg | EXTENDED_RELEASE_TABLET | Freq: Two times a day (BID) | ORAL | Status: DC
Start: 2018-04-20 — End: 2018-04-24
  Administered 2018-04-20 – 2018-04-24 (×8): 10 mg via ORAL
  Filled 2018-04-20 (×8): qty 1

## 2018-04-20 MED ORDER — POLYETHYLENE GLYCOL 3350 17 G PO PACK
17.00 g | PACK | Freq: Every day | ORAL | Status: DC
Start: 2018-04-21 — End: 2018-04-24
  Administered 2018-04-21 – 2018-04-23 (×3): 17 g via ORAL
  Filled 2018-04-20 (×4): qty 1

## 2018-04-20 MED ORDER — ALBUTEROL-IPRATROPIUM 2.5-0.5 (3) MG/3ML IN SOLN
3.00 mL | Freq: Four times a day (QID) | RESPIRATORY_TRACT | Status: DC | PRN
Start: 2018-04-20 — End: 2018-04-21

## 2018-04-20 MED ORDER — GLUCAGON 1 MG IJ SOLR (WRAP)
1.00 mg | INTRAMUSCULAR | Status: DC | PRN
Start: 2018-04-20 — End: 2018-04-24

## 2018-04-20 MED ORDER — HYDRALAZINE HCL 25 MG PO TABS
25.00 mg | ORAL_TABLET | Freq: Two times a day (BID) | ORAL | Status: DC
Start: 2018-04-20 — End: 2018-04-24
  Administered 2018-04-20 – 2018-04-24 (×8): 25 mg via ORAL
  Filled 2018-04-20 (×8): qty 1

## 2018-04-20 MED ORDER — GLUCOSE 40 % PO GEL
15.00 g | ORAL | Status: DC | PRN
Start: 2018-04-20 — End: 2018-04-24

## 2018-04-20 MED ORDER — DEXAMETHASONE SODIUM PHOSPHATE 0.1 % OP SOLN
1.00 [drp] | Freq: Every day | OPHTHALMIC | Status: DC
Start: 2018-04-21 — End: 2018-04-20

## 2018-04-20 MED ORDER — GABAPENTIN 300 MG PO CAPS
900.00 mg | ORAL_CAPSULE | Freq: Two times a day (BID) | ORAL | Status: DC
Start: 2018-04-20 — End: 2018-04-24
  Administered 2018-04-20 – 2018-04-24 (×8): 900 mg via ORAL
  Filled 2018-04-20 (×8): qty 3

## 2018-04-20 MED ORDER — FAMOTIDINE 20 MG PO TABS
20.00 mg | ORAL_TABLET | Freq: Every day | ORAL | Status: DC
Start: 2018-04-21 — End: 2018-04-24
  Administered 2018-04-21 – 2018-04-24 (×4): 20 mg via ORAL
  Filled 2018-04-20 (×4): qty 1

## 2018-04-20 MED ORDER — DEXAMETHASONE 0.1 % OP SUSP
1.00 [drp] | Freq: Every day | OPHTHALMIC | Status: DC
Start: 2018-04-21 — End: 2018-04-20

## 2018-04-20 MED ORDER — METHYLPREDNISOLONE SODIUM SUCC 125 MG IJ SOLR
125.00 mg | Freq: Once | INTRAMUSCULAR | Status: AC
Start: 2018-04-20 — End: 2018-04-20
  Administered 2018-04-20: 21:00:00 125 mg via INTRAVENOUS
  Filled 2018-04-20: qty 2

## 2018-04-20 MED ORDER — CARBOXYMETHYLCELLULOSE SODIUM 0.5 % OP SOLN
1.00 [drp] | Freq: Four times a day (QID) | OPHTHALMIC | Status: DC
Start: 2018-04-20 — End: 2018-04-24
  Administered 2018-04-22 (×3): 1 [drp] via OPHTHALMIC
  Filled 2018-04-20: qty 1

## 2018-04-20 MED ORDER — OXYCODONE HCL ER 10 MG PO T12A
10.00 mg | EXTENDED_RELEASE_TABLET | Freq: Two times a day (BID) | ORAL | Status: DC | PRN
Start: 2018-04-20 — End: 2018-04-20

## 2018-04-20 MED ORDER — ASPIRIN 81 MG PO CHEW
81.00 mg | CHEWABLE_TABLET | Freq: Every day | ORAL | Status: DC
Start: 2018-04-21 — End: 2018-04-24
  Administered 2018-04-21 – 2018-04-24 (×4): 81 mg via ORAL
  Filled 2018-04-20 (×4): qty 1

## 2018-04-20 MED ORDER — METOPROLOL SUCCINATE ER 50 MG PO TB24
100.00 mg | ORAL_TABLET | Freq: Every day | ORAL | Status: DC
Start: 2018-04-21 — End: 2018-04-24
  Administered 2018-04-21 – 2018-04-24 (×4): 100 mg via ORAL
  Filled 2018-04-20 (×4): qty 2

## 2018-04-20 MED ORDER — INSULIN GLARGINE 100 UNIT/ML SC SOLN
22.00 [IU] | Freq: Every morning | SUBCUTANEOUS | Status: DC
Start: 2018-04-21 — End: 2018-04-24
  Administered 2018-04-21 – 2018-04-24 (×4): 22 [IU] via SUBCUTANEOUS
  Filled 2018-04-20 (×4): qty 22

## 2018-04-20 MED ORDER — INSULIN GLARGINE 100 UNIT/ML SC SOLN
18.00 [IU] | Freq: Every evening | SUBCUTANEOUS | Status: DC
Start: 2018-04-20 — End: 2018-04-20

## 2018-04-20 MED ORDER — BUMETANIDE 0.25 MG/ML IJ SOLN
2.00 mg | Freq: Two times a day (BID) | INTRAMUSCULAR | Status: DC
Start: 2018-04-21 — End: 2018-04-24
  Administered 2018-04-21 – 2018-04-24 (×6): 2 mg via INTRAVENOUS
  Filled 2018-04-20 (×7): qty 10

## 2018-04-20 MED ORDER — LIDOCAINE 5 % EX PTCH
1.00 | MEDICATED_PATCH | CUTANEOUS | Status: DC
Start: 2018-04-20 — End: 2018-04-24
  Administered 2018-04-20 – 2018-04-23 (×3): 1 via TRANSDERMAL
  Filled 2018-04-20 (×4): qty 1

## 2018-04-20 MED ORDER — LEVOTHYROXINE SODIUM 50 MCG PO TABS
50.00 ug | ORAL_TABLET | Freq: Every day | ORAL | Status: DC
Start: 2018-04-21 — End: 2018-04-24
  Administered 2018-04-21 – 2018-04-24 (×4): 50 ug via ORAL
  Filled 2018-04-20 (×4): qty 1

## 2018-04-20 NOTE — H&P (Signed)
[facility] H&P Note    Patient Info:   Date/Time: 04/20/2018 / 9:39 PM   Admit Date:04/20/2018  Patient Name:Rachael [name]   ZOX:[id number]   PCP: Kerin Perna, DO  Attending Physician:Pulizzi, Earney Hamburg*  Assessment:     82 years old woman with history of COPD, GERD, HLD, HTN, hypothyroidism, chronic low back pain, DM insulin dependent presents with complaints of left shoulder pain and shortness of breath.     # Dyspnea - multifactorial due to COPD and mild pulmonary edema due to acute on chronic diastolic heart failure  Last echo in Epic in 01/2017 showing LV ejection fraction of 65% with grade 1 diastolic dysfunction  CXR showing mild central vascular congestion  Elevated BNP at 220  Troponin normal   Patient received Lasix 20 mg IV in the ER, at home she is taking Bumex milligrams twice a day as per med rec but patient is not sure of the doses  Continue with Bumex 2 mg IV times a day  Will consult cardiology/Dr. Alanson Aly in the morning and will leave up to him if he is to order another echocardiogram  Continue with oxygen supplementation as needed  Monitor I's and O's, fluid restriction   Daily weights    # Left shoulder and left chest pain - this is reproducible by palpation   Patient had a recent fall with left shoulder dislocation and she has intermittent pain since     # Mild leukocytosis- likely reactive  Will monitor       #Diabetes-insulin-dependent  Check hemoglobin A1c  Hold metformin  Continue home doses of Lantus and sliding scale  Diabetic  diet    #Hypertension-seems well controlled  Continue home meds    #Chronic lower extremity lymphedema  Bumex at home    # Chronic anemia - at baseline     #COPD-not in exacerbation  Continue home inhalers  Continue duo nebs as needed    #Chronic low back pain  Continue home pain management    #Hypothyroidism   Check TSH  continue Synthroid    # Elevated D dimer   She has a Well's score of zero , will not not do a chest CTA           Plan:     See above       DVT Prohylaxis:lovenox   Central Line/Foley Catheter/PICC line status: none  Code Status: Prior  Disposition:home  Type of Admission:Inpatient  Estimated Length of Stay (including stay in the ER receiving treatment): 2-3 days     Clinical Information and History:   Chief Complaint:  Chief Complaint   Patient presents with   . Chest Pain   . Shortness of Breath     Past Medical History:  Past Medical History:   Diagnosis Date   . Anemia    . Anxiety    . Arthritis    . 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 Chronic cholecystitis with calculus 10/31/2015   . Chronic obstructive pulmonary disease    . Gastroesophageal reflux disease    . Hiatal hernia    . Hyperlipidemia    . Hypertension    . Hypothyroidism    . Incontinent of urine 2014    -WEARS DIAPERS-WILL BRING HER OWN   . Low back pain    . Lymphedema of right lower extremity    . PMR (polymyalgia rheumatica) 01/07/2017   . Polymyalgia  rheumatica 09/2014   . Sleep apnea     USES CPAP NIGHTLY   . Type 2 diabetes mellitus, controlled    . Type II or unspecified type diabetes mellitus without mention of complication, not stated as uncontrolled    . Venous insufficiency of right leg 2016    also has lymphedema-KEEPS HER LEGS WRAPPED EXCEPT FOR WHEN SHE BATHES     Past Surgical History:  Past Surgical History:   Procedure Laterality Date   . BACK SURGERY  2014   . CARPAL TUNNEL RELEASE Bilateral 1978   . CLOSED REDUCTION SHOULDER DISLOCATION     . HYSTERECTOMY  1985   . RIGHT & LEFT HEART CATH  W/ CORONARY ANGIOS, LV/LA Bilateral 01/26/2018    Procedure: Right & Left Heart Cath  w/ Coronary Angios, LV/LA;  Surgeon: Elbert Ewings, MD;  [facility];  Service: Cardiovascular;  Laterality: Bilateral;   . ROBOTIC, CHOLECYSTECTOMY N/A 10/31/2015    Procedure: ROBOT ASSISTED, LAPAROSCOPIC, CHOLECYSTECTOMY, UMBILICAL HERNIA REPAIR;  Surgeon: Alen Blew, MD;  Location: [place];  Service: General;   Laterality: N/A;     Family History:no known cardiac history, no known cancer history  Social History:  History   Alcohol Use   . 1.8 oz/week   . 3 Glasses of wine per week     Comment: social     History   Drug Use No     History   Smoking Status   . Former Smoker   . Packs/day: 1.00   . Years: 25.00   . Quit date: 11/16/1998   Smokeless Tobacco   . Never Used     Social History     Social History   . Marital status: Widowed     Spouse name: N/A   . Number of children: N/A   . Years of education: N/A     Social History Main Topics   . Smoking status: Former Smoker     Packs/day: 1.00     Years: 25.00     Quit date: 11/16/1998   . Smokeless tobacco: Never Used   . Alcohol use 1.8 oz/week     3 Glasses of wine per week      Comment: social   . Drug use: No   . Sexual activity: Not Asked     Other Topics Concern   . None     Social History Narrative   . None     Allergies:  Allergies   Allergen Reactions   . Tolectin [Tolmetin] Swelling   . Lyrica [Pregabalin]    . Nsaids    . Latex Itching     Medications:  (Not in a hospital admission)  Clinical Presentation: HPI:   Rachael [name] is a 82 y.o. female who has history of   Past Surgical History:   Procedure Laterality Date   . BACK SURGERY  2014   . CARPAL TUNNEL RELEASE Bilateral 1978   . CLOSED REDUCTION SHOULDER DISLOCATION     . HYSTERECTOMY  1985   . RIGHT & LEFT HEART CATH  W/ CORONARY ANGIOS, LV/LA Bilateral 01/26/2018    Procedure: Right & Left Heart Cath  w/ Coronary Angios, LV/LA;  Surgeon: Elbert Ewings, MD;  [facility];  Service: Cardiovascular;  Laterality: Bilateral;   . ROBOTIC, CHOLECYSTECTOMY N/A 10/31/2015    Procedure: ROBOT ASSISTED, LAPAROSCOPIC, CHOLECYSTECTOMY, UMBILICAL HERNIA REPAIR;  Surgeon: Alen Blew, MD;  Location: [facility];  Service:  General;  Laterality: N/A;    Past Medical History:   Diagnosis Date   . Anemia    . Anxiety    . Arthritis    . Bilateral cataracts 2014    right - blind in left eye    . Chronic cholecystitis with calculus 10/31/2015   . Chronic obstructive pulmonary disease    . Gastroesophageal reflux disease    . Hiatal hernia    . Hyperlipidemia    . Hypertension    . Hypothyroidism    . Incontinent of urine 2014    -WEARS DIAPERS-WILL BRING HER OWN   . Low back pain    . Lymphedema of right lower extremity    . PMR (polymyalgia rheumatica) 01/07/2017   . Polymyalgia rheumatica 09/2014   . Sleep apnea     USES CPAP NIGHTLY   . Type 2 diabetes mellitus, controlled    . Type II or unspecified type diabetes mellitus without mention of complication, not stated as uncontrolled    . Venous insufficiency of right leg 2016    also has lymphedema-KEEPS HER LEGS WRAPPED EXCEPT FOR WHEN SHE BATHES    came with the chief complaint of Chest Pain and Shortness of Breath  82 years old with above history presents with complaints of chest pain and shortness of breath. Her history is very vague and none of her complaints seem acute. She states that she's been having left shoulder pain which radiates towards her left chest ever since she feel and dislocated he shoulder. Her shortness of breath is chronic due her COPD, but she noticed it getting worse over the past week and a half, however she only decided to come to the hospital now. She states that she's able to sleep flat at home.   Procedures:   Date/Procedure:none    Review of Systems:   Chief Complaint:  Chest Pain and Shortness of Breath    Review of Systems   Respiratory: Positive for shortness of breath. Negative for cough, hemoptysis, sputum production and wheezing.    Cardiovascular: Positive for leg swelling. Negative for chest pain, palpitations, orthopnea, claudication and PND.   Gastrointestinal: Negative for abdominal pain, constipation, diarrhea, nausea and vomiting.   Genitourinary: Negative for dysuria and urgency.   Musculoskeletal: Positive for back pain.     Physical Exam:     Vitals:    04/20/18 1700 04/20/18 1800 04/20/18 1900 04/20/18  2000   BP: 123/68 118/60 107/56 104/58   Pulse: 86 87 80 88   Resp: 15 16 14 16    SpO2: (!) 87% 96% (!) 89% 96%   Weight:       Height:         Physical Exam:   Physical Exam   HENT:   Head: Normocephalic and atraumatic.   Neck: No JVD present.   Cardiovascular: Normal rate and regular rhythm.    Pulmonary/Chest: Effort normal. She has no wheezes.   Bibasilar rales    Abdominal: Soft. Bowel sounds are normal. She exhibits no distension. There is no tenderness.   Musculoskeletal: She exhibits edema.   B/l chronic lymphedema   Skin: Skin is warm and dry.   Psychiatric: She has a normal mood and affect. Her behavior is normal.     Results of Labs/imaging:   Labs have been reviewed:   Coagulation Profile:       CBC review:   Recent Labs  Lab 04/20/18  1755   WBC 13.77*   Hgb 9.1*  Hematocrit 32.2*   Platelets 486*   MCV 75.6*   RDW 20*   Neutrophils 86.9   Lymphocytes Automated 6.8   Eosinophils Automated 0.0   Immature Granulocyte 1.3   Neutrophils Absolute 11.96*   Absolute Immature Granulocyte 0.18*     Chem Review:  Recent Labs  Lab 04/20/18  1755   Sodium 142   Potassium 4.3   Chloride 98*   CO2 27   BUN 17.7   Creatinine 0.9   Glucose 185*   Calcium 9.1   Bilirubin, Total 0.3   AST (SGOT) 20   ALT 12   Alkaline Phosphatase 80     Results     Procedure Component Value Units Date/Time    D-Dimer [[id number]]  (Abnormal) Collected:  04/20/18 1827     Updated:  04/20/18 1846     D-Dimer 0.82 (H) ug/mL FEU     B-type Natriuretic Peptide [[id number]]  (Abnormal) Collected:  04/20/18 1755    Specimen:  Blood Updated:  04/20/18 1840     B-Natriuretic Peptide 220.3 (H) pg/mL     Troponin I [[id number]] Collected:  04/20/18 1755    Specimen:  Blood Updated:  04/20/18 1824     Troponin I <0.01 ng/mL     Comprehensive metabolic panel [[id number]]  (Abnormal) Collected:  04/20/18 1755    Specimen:  Blood Updated:  04/20/18 1820     Glucose 185 (H) mg/dL      BUN 62.9 mg/dL      Creatinine 0.9 mg/dL      Sodium 528 mEq/L       Potassium 4.3 mEq/L      Chloride 98 (L) mEq/L      CO2 27 mEq/L      Calcium 9.1 mg/dL      Protein, Total 7.1 g/dL      Albumin 3.7 g/dL      AST (SGOT) 20 U/L      ALT 12 U/L      Alkaline Phosphatase 80 U/L      Bilirubin, Total 0.3 mg/dL      Globulin 3.4 g/dL      Albumin/Globulin Ratio 1.1     Anion Gap 17.0 (H)    GFR [[id number]] Collected:  04/20/18 1755     Updated:  04/20/18 1820     EGFR >60.0    CBC with differential [[id number]]  (Abnormal) Collected:  04/20/18 1755    Specimen:  Blood from Blood Updated:  04/20/18 1807     WBC 13.77 (H) x10 3/uL      Hgb 9.1 (L) g/dL      Hematocrit 03.4 (L) %      Platelets 486 (H) x10 3/uL      RBC 4.26 x10 6/uL      MCV 75.6 (L) fL      MCH 21.4 (L) pg      MCHC 28.3 (L) g/dL      RDW 20 (H) %      MPV 9.1 fL      Neutrophils 86.9 %      Lymphocytes Automated 6.8 %      Monocytes 4.9 %      Eosinophils Automated 0.0 %      Basophils Automated 0.1 %      Immature Granulocyte 1.3 %      Nucleated RBC 0.0 /100 WBC      Neutrophils Absolute 11.96 (H) x10 3/uL      Abs Lymph Automated 0.94 x10  3/uL      Abs Mono Automated 0.68 x10 3/uL      Abs Eos Automated 0.00 x10 3/uL      Absolute Baso Automated 0.01 x10 3/uL      Absolute Immature Granulocyte 0.18 (H) x10 3/uL      Absolute NRBC 0.00 x10 3/uL         Radiology reports have been reviewed:  Radiology Results (24 Hour)     Procedure Component Value Units Date/Time    Chest 2 Views [[id number]] Collected:  04/20/18 1847    Order Status:  Completed Updated:  04/20/18 1852    Narrative:       History: Chest pain.    Frontal and lateral views of the chest were obtained and compared to  03/01/2018.    Mild central vascular congestion. Minimal basal atelectasis. No focal  consolidation or pleural effusions. Mild cardiomegaly.      Impression:         Mild cardiomegaly and mild central vascular congestion. Minimal basilar  atelectasis.    Clide Cliff, MD   04/20/2018 6:48 PM        EKG: EKG reviewed   Last EKG Result      Procedure Component Value Units Date/Time    ECG 12 lead [[id number]] Collected:  04/20/18 1708     Updated:  04/20/18 1713     Ventricular Rate 88 BPM      Atrial Rate 85 BPM      P-R Interval -- ms      QRS Duration 66 ms      Q-T Interval 364 ms      QTC Calculation (Bezet) 440 ms      P Axis -- degrees      R Axis 37 degrees      T Axis 52 degrees     Narrative:       UNDETERMINED RHYTHM  NONSPECIFIC T WAVE ABNORMALITY  ABNORMAL ECG  WHEN COMPARED WITH ECG OF 01-Mar-2018 07:56,  CURRENT UNDETERMINED RHYTHM PRECLUDES RHYTHM COMPARISON, NEEDS REVIEW  NONSPECIFIC T WAVE ABNORMALITY, WORSE IN LATERAL LEADS    EKG SCAN [[id number]] Resulted:  04/20/18 1713     Updated:  04/20/18 1713          Hospitalist:   Signed by:   Gardiner Coins A Louana Fontenot  04/20/2018 9:39 PM    *This note was generated by the Epic EMR system/ Dragon speech recognition and may contain inherent errors or omissions not intended by the user. Grammatical errors, random word insertions, deletions, pronoun errors and incomplete sentences are occasional consequences of this technology due to software limitations. Not all errors are caught or corrected. If there are questions or concerns about the content of this note or information contained within the body of this dictation they should be addressed directly with the author for clarification.

## 2018-04-20 NOTE — UM Notes (Signed)
UR Review 04/20/18    83 yr old presented to ED on 04/20/18 and placed on IP status for SOB        ED Summary:        VS: hr 87, spo2 87%, rr 16, 123/68    Labs: wbc 13.77, hbg 9.1, hct 32.2, plat 486, glucose 185, chl 98, anion gap 17, bnp 220.3, d-dimer 0.82    EKG:  UNDETERMINED RHYTHM  NONSPECIFIC T WAVE ABNORMALITY  ABNORMAL ECG    CXR:  Mild cardiomegaly and mild central vascular congestion. Minimal basilar  atelectasis.      DX:  Acute respiratory failure

## 2018-04-20 NOTE — Plan of Care (Signed)
Problem: Safety  Goal: Patient will be free from injury during hospitalization  Outcome: Progressing   04/20/18 2341   Goal/Interventions addressed this shift   Patient will be free from injury during hospitalization  Assess patient's risk for falls and implement fall prevention plan of care per policy;Provide and maintain safe environment;Use appropriate transfer methods;Ensure appropriate safety devices are available at the bedside;Include patient/ family/ care giver in decisions related to safety;Hourly rounding;Assess for patients risk for elopement and implement Elopement Risk Plan per policy   Pt verbalized understanding of need to call if she is to get out of bed. Bed alarm on. Call bell and bedside table in reach

## 2018-04-20 NOTE — EDIE (Signed)
COLLECTIVE?NOTIFICATION?04/20/2018 16:48?Rachael [name], Rachael [name]?MRN: [id number]    [facility]'s patient encounter information:   WJX:?[id number]  Account [id number]  Billing Account [id number]      Criteria Met      Narx Scores Alert    Security and Safety  No recent Security Events currently on file    ED Care Guidelines  There are currently no ED Care Guidelines for this patient. Please check your facility's medical records system.      Prescription Monitoring Program  642??- Narcotic Use Score  280??- Sedative Use Score  000??- Stimulant Use Score  380??- Overdose Risk Score  - All Scores range from 000-999 with 75% of the population scoring < 200 and on 1% scoring above 650  - The last digit of the narcotic, sedative, and stimulant score indicates the number of active prescriptions of that type  - Higher Use scores correlate with increased prescribers, pharmacies, mg equiv, and overlapping prescriptions  - Higher Overdose Risk Scores correlate with increased risk of unintentional overdose death   Concerning or unexpectedly high scores should prompt a review of the PMP record; this does not constitute checking PMP for prescribing purposes.      E.D. Visit Count (12 mo.)  Facility Visits   [facility]   Total 4   Note: Visits indicate total known visits.      Recent Emergency Department Visit Summary  Date Facility [facility] Type Diagnoses or Chief Complaint   Apr 20, 2018 [name]      chest pain      Apr 04, 2018 [name]      Fluid In legs      Leg Swelling      Contusion of right lower leg, initial encounter      Lymphedema, not elsewhere classified      Mar 01, 2018 [place] [name]      Fall      Iron deficiency anemia, unspecified      Contusion of other part of head, initial encounter      Elevated blood-pressure reading, without diagnosis of hypertension      Thrombocytopenia, unspecified       Anterior dislocation of left humerus, initial encounter      Hyperglycemia, unspecified      Unspecified fall, initial encounter      Jul 20, 2017 - [place]      shortness of breath      Other forms of dyspnea          Recent Inpatient Visit Summary  No recorded inpatient visits.     Care Providers  There are no care providers on record at this time.   Collective Portal  This patient has registered at the [facility] Emergency Department   For more information visit: [email or web address]     PLEASE NOTE:    1.   Any care recommendations and other clinical information are provided as guidelines or for historical purposes only, and providers should exercise their own clinical judgment when providing care.    2.   You may only use this information for purposes of treatment, payment or health care operations activities, and subject to the limitations of applicable [employer] Policies.    3.   You should consult directly with the organization that provided a care guideline or other clinical history with any questions about additional information or accuracy or completeness  of information provided.    ? 2019 [employer] - [email or web address]

## 2018-04-20 NOTE — Progress Notes (Signed)
CM was consulted by Dr. Laurell Roof as patient and daughter requested to speak to case management regarding coordination of care post discharge. CM visited patient but daughter had left for the night. Patient requested for case management to follow up tomorrow once daughter is present. Oncoming CM will continue to follow.

## 2018-04-21 ENCOUNTER — Inpatient Hospital Stay: Payer: No Typology Code available for payment source

## 2018-04-21 LAB — HEMOGLOBIN A1C
Average Estimated Glucose: 165.7 mg/dL
Hemoglobin A1C: 7.4 % — ABNORMAL HIGH (ref 4.6–5.9)

## 2018-04-21 LAB — CBC
Absolute NRBC: 0 10*3/uL (ref 0.00–0.00)
Hematocrit: 30.9 % — ABNORMAL LOW (ref 34.7–43.7)
Hgb: 8.6 g/dL — ABNORMAL LOW (ref 11.4–14.8)
MCH: 20.9 pg — ABNORMAL LOW (ref 25.1–33.5)
MCHC: 27.8 g/dL — ABNORMAL LOW (ref 31.5–35.8)
MCV: 75.2 fL — ABNORMAL LOW (ref 78.0–96.0)
MPV: 8.9 fL (ref 8.9–12.5)
Nucleated RBC: 0 /100 WBC (ref 0.0–0.0)
Platelets: 410 10*3/uL — ABNORMAL HIGH (ref 142–346)
RBC: 4.11 10*6/uL (ref 3.90–5.10)
RDW: 20 % — ABNORMAL HIGH (ref 11–15)
WBC: 9.32 10*3/uL (ref 3.10–9.50)

## 2018-04-21 LAB — ECG 12-LEAD
Atrial Rate: 85 {beats}/min
Q-T Interval: 364 ms
QRS Duration: 66 ms
QTC Calculation (Bezet): 440 ms
R Axis: 37 degrees
T Axis: 52 degrees
Ventricular Rate: 88 {beats}/min

## 2018-04-21 LAB — BASIC METABOLIC PANEL
Anion Gap: 12 (ref 5.0–15.0)
BUN: 16.6 mg/dL (ref 7.0–19.0)
CO2: 30 mEq/L — ABNORMAL HIGH (ref 22–29)
Calcium: 8.9 mg/dL (ref 7.9–10.2)
Chloride: 101 mEq/L (ref 100–111)
Creatinine: 0.8 mg/dL (ref 0.6–1.0)
Glucose: 230 mg/dL — ABNORMAL HIGH (ref 70–100)
Potassium: 3.6 mEq/L (ref 3.5–5.1)
Sodium: 143 mEq/L (ref 136–145)

## 2018-04-21 LAB — GLUCOSE WHOLE BLOOD - POCT
Whole Blood Glucose POCT: 207 mg/dL — ABNORMAL HIGH (ref 70–100)
Whole Blood Glucose POCT: 266 mg/dL — ABNORMAL HIGH (ref 70–100)
Whole Blood Glucose POCT: 251 mg/dL — ABNORMAL HIGH (ref 70–100)
Whole Blood Glucose POCT: 219 mg/dL — ABNORMAL HIGH (ref 70–100)

## 2018-04-21 LAB — CELL MORPHOLOGY
Cell Morphology: ABNORMAL — AB
Platelet Estimate: INCREASED — AB

## 2018-04-21 LAB — B-TYPE NATRIURETIC PEPTIDE: B-Natriuretic Peptide: 335.6 pg/mL — ABNORMAL HIGH (ref 0.0–100.0)

## 2018-04-21 LAB — TROPONIN I: Troponin I: <0.01 ng/mL (ref 0.00–0.09)

## 2018-04-21 LAB — GFR: EGFR: >60

## 2018-04-21 MED ORDER — IODIXANOL 320 MG/ML IV SOLN
100.00 mL | Freq: Once | INTRAVENOUS | Status: AC | PRN
Start: 2018-04-21 — End: 2018-04-21
  Administered 2018-04-21: 04:00:00 100 mL via INTRAVENOUS

## 2018-04-21 MED ORDER — ALBUTEROL-IPRATROPIUM 2.5-0.5 (3) MG/3ML IN SOLN
3.00 mL | Freq: Four times a day (QID) | RESPIRATORY_TRACT | Status: DC
Start: 2018-04-21 — End: 2018-04-24
  Administered 2018-04-21 – 2018-04-24 (×12): 3 mL via RESPIRATORY_TRACT
  Filled 2018-04-21 (×13): qty 3

## 2018-04-21 MED ORDER — ENOXAPARIN SODIUM 40 MG/0.4ML SC SOLN
40.00 mg | SUBCUTANEOUS | Status: DC
Start: 2018-04-21 — End: 2018-04-24
  Administered 2018-04-21 – 2018-04-24 (×4): 40 mg via SUBCUTANEOUS
  Filled 2018-04-21 (×4): qty 0.4

## 2018-04-21 MED ORDER — FLUTICASONE FUROATE-VILANTEROL 100-25 MCG/INH IN AEPB
1.00 | INHALATION_SPRAY | Freq: Every morning | RESPIRATORY_TRACT | Status: DC
Start: 2018-04-21 — End: 2018-04-24
  Administered 2018-04-22 – 2018-04-24 (×3): 1 via RESPIRATORY_TRACT
  Filled 2018-04-21: qty 14

## 2018-04-21 NOTE — Progress Notes (Signed)
Patient tolerated Oxygen at 3L per NC throughout the afternoon with oxygen saturation 95-99%. O2 decreased to 2L, will continue to monitor.

## 2018-04-21 NOTE — Consults (Signed)
[facility]ART [facility] CONSULTATION REPORT  [facility]    Date Time: 04/21/18 9:09 AM  Patient Name: Rachael [name]  Requesting Physician: Bartolo Darter, MD     Reason for Consultation:   CP  History:   Rachael [name] is a 82 y.o. female admitted on 04/20/2018, for whom I am asked to provide cardiac consultation by Bartolo Darter, MD.   Patient with history of COPD, hypertension, obesity, PMR underwent cardiac catheterization March 2019 that showed nonobstructive CAD with mild aortic stenosis.  Echocardiogram October 2018 showed normal ejection fraction.  Patient had a fall a month ago and injured her left shoulder.  Complaint of left-sided shoulder arm and chest discomfort 3 out of 10 describes as ache worse with movement.  Also was feeling short of breath and had wheezing so came to the hospital.  Her troponin is unremarkable.  Telemetry showed sinus rhythm with sinus tach and PVC.  Assessment:     Patient Active Problem List   Diagnosis   . Chronic obstructive pulmonary disease with (acute) exacerbation   . Hypoxia   . Anxiety   . GERD (gastroesophageal reflux disease)   . HLD (hyperlipidemia)   . HTN (hypertension)   . Hypothyroid   . PMR (polymyalgia rheumatica)   . OSA (obstructive sleep apnea)   . Diabetes mellitus, type II   . Generalized weakness   . DOE (dyspnea on exertion)   . Constipation, unspecified constipation type   . Acute respiratory failure   . SOB (shortness of breath)   Pulmonary hypertension  Nonobstructive CAD  Recommendations:   Patient with above-mentioned medical problems here with chest pain with atypical features and shortness of breath.  Serial cardiac markers for myocardial infarctions.  Agree with gentle diuresis.  Cardiac catheterization earlier this year showed nonobstructive CAD normal ejection fraction and moderate pulmonary hypertension probably due to her underlying COPD.  No further cardiac work-up is needed at this time.  Continue gentle  diuresis.  Call with questions.    Physical Exam:   Patient Vitals for the past 12 hrs:   BP Temp Pulse Resp   04/21/18 0409 143/76 98.2 F (36.8 C) 98 17   04/21/18 0156 123/77 97.9 F (36.6 C) 86 19   04/20/18 2350 - - 93 -   04/20/18 2216 120/74 98.1 F (36.7 C) 90 (!) 24    83.9 kg (185 lb) 1.499 m (4\' 11" )   Temp (24hrs), Avg:98.1 F (36.7 C), Min:97.9 F (36.6 C), Max:98.2 F (36.8 C)    Intake and Output Summary (Last 24 hours) at Date Time    Intake/Output Summary (Last 24 hours) at 04/21/18 0909  Last data filed at 04/21/18 0815   Gross per 24 hour   Intake              557 ml   Output              300 ml   Net              257 ml       GENERAL: Patient is in no acute distress   HEENT: No scleral icterus or conjunctival pallor, moist mucous membranes   NECK: No jugular venous distention or thyromegaly  CARDIAC: S1 & S2 regular, GII/VI SEM  CHEST: Clear to auscultation bilaterally, normal respiratory effort  ABDOMEN:  Nontender, non-distended, good bowel sounds  EXTREMITIES: No clubbing, cyanosis, +edema   SKIN: No rash or jaundice   NEUROLOGIC:  Alert and oriented to time, place and person    Past Medical History:     Past Medical History:   Diagnosis Date   . Anemia    . Anxiety    . Arthritis    . 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 Chronic cholecystitis with calculus 10/31/2015   . Chronic obstructive pulmonary disease    . Gastroesophageal reflux disease    . Hiatal hernia    . Hyperlipidemia    . Hypertension    . Hypothyroidism    . Incontinent of urine 2014    -WEARS DIAPERS-WILL BRING HER OWN   . Low back pain    . Lymphedema of right lower extremity    . PMR (polymyalgia rheumatica) 01/07/2017   . Polymyalgia rheumatica 09/2014   . Sleep apnea     USES CPAP NIGHTLY   . Type 2 diabetes mellitus, controlled    . Type II or unspecified type diabetes mellitus without mention of complication, not stated as uncontrolled    . Venous insufficiency of right leg 2016    also has  lymphedema-KEEPS HER LEGS WRAPPED EXCEPT FOR WHEN SHE BATHES     Past Surgical History:     Past Surgical History:   Procedure Laterality Date   . BACK SURGERY  2014   . CARPAL TUNNEL RELEASE Bilateral 1978   . CLOSED REDUCTION SHOULDER DISLOCATION     . HYSTERECTOMY  1985   . RIGHT & LEFT HEART CATH  W/ CORONARY ANGIOS, LV/LA Bilateral 01/26/2018    Procedure: Right & Left Heart Cath  w/ Coronary Angios, LV/LA;  Surgeon: Elbert Ewings, MD;  [facility];  Service: Cardiovascular;  Laterality: Bilateral;   . ROBOTIC, CHOLECYSTECTOMY N/A 10/31/2015    Procedure: ROBOT ASSISTED, LAPAROSCOPIC, CHOLECYSTECTOMY, UMBILICAL HERNIA REPAIR;  Surgeon: Alen Blew, MD;  Location: [place] [facility] OR;  Service: General;  Laterality: N/A;     Family History:   History reviewed. No pertinent family history.  Social History:     Social History     Social History   . Marital status: Widowed     Spouse name: N/A   . Number of children: N/A   . Years of education: N/A     Social History Main Topics   . Smoking status: Former Smoker     Packs/day: 1.00     Years: 25.00     Quit date: 11/16/1998   . Smokeless tobacco: Never Used   . Alcohol use 1.8 oz/week     3 Glasses of wine per week      Comment: social   . Drug use: No   . Sexual activity: Not on file     Other Topics Concern   . Not on file     Social History Narrative   . No narrative on file     Allergies:     Allergies   Allergen Reactions   . Tolectin [Tolmetin] Swelling   . Lyrica [Pregabalin]      tremors   . Nsaids    . Latex Itching     Medications:     Prescriptions Prior to Admission   Medication Sig   . albuterol-ipratropium (DUO-NEB) 2.5-0.5(3) mg/3 mL nebulizer Take 3 mLs by nebulization 4 (four) times daily as needed.   . ALPRAZolam (XANAX) 0.25 MG tablet Take 0.25 mg by mouth nightly as needed.   Marland Kitchen aspirin 81 MG chewable tablet  Chew 1 tablet (81 mg total) by mouth daily.   . bisacodyl (BISCOLAX) 10 mg suppository Place 10 mg rectally daily.   .  bumetanide (BUMEX) 1 MG tablet Take 2 mg by mouth 2 (two) times daily       . celecoxib (CELEBREX) 200 MG capsule Take 200 mg by mouth daily.       . cetirizine (ZYRTEC) 10 MG tablet Take 10 mg by mouth daily.   . dilTIAZem (CARDIZEM CD) 300 MG 24 hr capsule Take 1 capsule (300 mg total) by mouth daily.   . DULoxetine (CYMBALTA) 60 MG capsule Take 60 mg by mouth 2 (two) times daily.   Marland Kitchen gabapentin (NEURONTIN) 300 MG capsule Take 900 mg by mouth 2 (two) times daily.       Marland Kitchen guaiFENesin (MUCINEX) 600 MG 12 hr tablet Take 1 tablet (600 mg total) by mouth every 12 (twelve) hours. (Patient taking differently: Take 600 mg by mouth 2 (two) times daily.    )   . hydrALAZINE (APRESOLINE) 25 MG tablet Take 1 tablet by mouth 2 (two) times daily.   Marland Kitchen HYDROcodone-acetaminophen (NORCO) 5-325 MG per tablet Take 1 tablet by mouth every 4 (four) hours as needed for Pain. (Patient taking differently: Take 1 tablet by mouth every 6 (six) hours.    )   . insulin glargine (LANTUS) 100 UNIT/ML injection Inject 18 Units into the skin nightly.   . insulin glargine (LANTUS) 100 UNIT/ML injection Inject 28 Units into the skin every morning       . insulin lispro (HUMALOG) 100 UNIT/ML injection Inject 1-8 Units into the skin 3 (three) times daily before meals as needed (High blood glucose. See administration instructions.). (Patient taking differently: Inject 2-10 Units into the skin 4 (four) times daily.    )   . lactobacillus acidophilus & bulgar (LACTINEX) chewable tablet Chew 1 tablet by mouth 2 (two) times daily.       Marland Kitchen levothyroxine (SYNTHROID, LEVOTHROID) 50 MCG tablet Take 50 mcg by mouth Once a day at 6:00am.   . lidocaine (LIDODERM) 5 % Place 1 patch onto the skin daily.Remove & Discard patch within 12 hours or as directed by MD   . loperamide (IMODIUM) 2 MG capsule Take 2 mg by mouth as needed for Diarrhea.   . loteprednol (LOTEMAX) 0.2 % Suspension Place 1 drop into both eyes 4 (four) times daily.       . metFORMIN  (GLUCOPHAGE-XR) 750 MG 24 hr tablet Take 750 mg by mouth nightly.   . metoprolol succinate (TOPROL-XL) 100 MG 24 hr tablet Take 100 mg by mouth daily.   . naloxone (NARCAN) 4 MG/0.1ML nasal spray 1 spray by Nasal route once as needed.For signs of opioid overdose.   . neomycin-bacitracin-polymyxin (NEOSPORIN) ointment Apply topically 2 (two) times daily.   Marland Kitchen oxyCODONE (OXYCONTIN) 10 MG 12 hr tablet Take 10 mg by mouth every 12 (twelve) hours.   . pantoprazole (PROTONIX) 40 MG tablet Take 40 mg by mouth daily.      . petrolatum (AQUAPHOR) ointment Apply topically as needed.   . Pitavastatin Calcium (LIVALO) 2 MG Tab Take 1 tablet by mouth nightly.       Bertram Gala Glycol-Propyl Glycol (SYSTANE ULTRA OP) Apply 1 drop to eye 4 (four) times daily.       . polyethylene glycol (MIRALAX) packet Take 17 g by mouth daily.   . potassium chloride (K-TAB,KLOR-CON) 10 MEQ tablet Take 20 mEq by mouth daily.       Marland Kitchen  raNITIdine (ZANTAC) 150 MG tablet Take 150 mg by mouth 2 (two) times daily.   . SYMBICORT 160-4.5 MCG/ACT inhaler Inhale 2 puffs into the lungs 2 (two) times daily.      Marland Kitchen tiotropium (SPIRIVA) 18 MCG inhalation capsule Place 18 mcg into inhaler and inhale daily.   . traZODone (DESYREL) 100 MG tablet Take 50 mg by mouth nightly.       Marland Kitchen UNABLE TO FIND Take 1 capsule by mouth 2 (two) times daily.Med Name: Tristan Schroeder      Review of Systems:   Comprehensive review of systems including constitutional, eyes, ears, nose, mouth, throat, cardiovascular, GI, GU, musculoskeletal, integumentary, respiratory, neurologic, psychiatric, and endocrine is negative other than what is mentioned already in the history of present illness.    Labs Reviewed:   Recent CMP   Recent Labs      04/21/18   0731  04/20/18   1755   Glucose  230*  185*   BUN  16.6  17.7   Creatinine  0.8  0.9   Sodium  143  142   Potassium  3.6  4.3   CO2  30*  27   Calcium  8.9  9.1   Albumin   --   3.7   AST (SGOT)   --   20   ALT   --   12   Globulin   --   3.4      Recent CBC WITH DIFF Recent Labs      04/21/18   0731   RBC  4.11   Hgb  8.6*   Hematocrit  30.9*   MCV  75.2*   MCHC  27.8*   RDW  20*   MPV  8.9   Platelets  410*     Radiology:   Radiological Procedure reviewed.    chest X-ray      Signed by: Brentt Fread,MDRuthann Cancer, RPVI  04/21/2018, 9:09 AM  [facility]  [phone]    *This note was generated by the Epic EMR system/ Dragon speech recognition and may contain inherent errors or omissions not intended by the user. Grammatical errors, random word insertions, deletions, pronoun errors and incomplete sentences are occasional consequences of this technology due to software limitations. Not all errors are caught or corrected. If there are questions or concerns about the content of this note or information contained within the body of this dictation they should be addressed directly with the author for clarification

## 2018-04-21 NOTE — Progress Notes (Signed)
Chaplaincy Services volunteer provided spiritual care through visitation.  Chaplain available for additional support if needed/desired.    Jarin Cornfield, BCC, MDiv, DMin   Chaplaincy Manager  [facility]  Chaplaincy Services  [phone]  **Page Chaplain 24/7 at [phone]**

## 2018-04-21 NOTE — Progress Notes (Signed)
Pt w/ elevated d-dimber, SOB, chest pain, and BLE edema. Paged hospitalist as no CTA was ordered. Dr. Ricka Burdock ordered CTA. Pt is a difficult stick for IV start and only has 20g. Working with Primary school teacher to get 18g IV started for CTA. Informed pt of plan of care. Pt verbalized understanding.

## 2018-04-21 NOTE — Progress Notes (Signed)
Attempted to use pt's home CPAP as per her request, but there was no way to connect oxygen. Called RT who placed pt on a hospital CPAP w/ 4L O2 bled in.

## 2018-04-21 NOTE — PT Eval Note (Signed)
[facility]  [address]  [place], [place]. [address]    Department of Rehabilitation  [phone]    Physical Therapy Evaluation    Patient: Rachael [name]    MRN#: [id number]     [id number]/[id number]    Time of treatment: Time Calculation  PT Received On: 04/21/18  Start Time: 1305  Stop Time: 1330  Time Calculation (min): 25 min    PT Visit Number: 1    Consult received for Rachael [name] for PT Evaluation and Treatment.  Patient's medical condition is appropriate for Physical therapy intervention at this time.      Assessment:   Rachael [name] is a 82 y.o. female admitted 04/20/2018.  Pt's functional mobility is impacted by:  decreased activity tolerance, decreased balance, gait impairment, decreased strength and transfers .  There are a few comorbidities or other factors that affect plan of care and require modification of task including: assistive device needed for mobility and home alone for a portion of the day.  Standardized tests and exams incorporated into evaluation include AMPAC mobility and balance.  Pt demonstrates a stable clinical presentation.   Pt would continue to benefit from PT to address these deficits and increase functional independence.     Complexity Level Hx and Co  morbidites Examination Clinical Decision Making Clinical Presentation   Low no impact 1-2 elements Limited options Stable   Moderate   1-2 factors 3 or more   Several options Evolving, plan may alter   High 3 or more 4 or more Multiple options Unstable, unpredictable       Impairments: Assessment: Decreased LE strength;Gait impairment;Decreased balance;Decreased functional mobility.     Therapy Diagnosis: generalized weakness, decreased functional mobility , decreased independence with ADL's and increased gait dysfunction due to chest pain, SOB. Without therapy interventions, patient is at risk for falls and decreased quality of life.    Rehabilitation Potential: Prognosis: Good;With continued PT  status post acute discharge      Plan:    Treatment/Interventions: Exercise;Gait training;Neuromuscular re-education;Functional transfer training;LE strengthening/ROM;Continued evaluation PT Frequency: 2-3x/wk    Risks/Benefits/POC Discussed with Pt/Family: With patient          Goals:   Goals  Goal Formulation: With patient  Time for Goal Acheivement: By time of discharge  Goals: Select goal  Pt Will Go Supine To Sit: modified independent;to maximize functional mobility and independence;3 visits  Pt Will Stand: modified independent;to maximize functional mobility and independence;3 visits  Pt Will Ambulate: 51-100 feet;to maximize functional mobility and independence;3 visits (LRAD)      Discharge Recommendations:   Based on today's session patient's discharge recommendation is the following: Discharge Recommendation: Home with supervision;Home with home health PT       DME Recommended for Discharge:  (In place)      Precautions and Contraindications:   Precautions  Weight Bearing Status: RUE non weight bearing (As per patient secondary to recent shoulder dislocation )  Other Precautions: falls    Medical Diagnosis: SOB (shortness of breath) [R06.02]    History of Present Illness: Rachael [name] is a 82 y.o. female admitted on 04/20/2018 with SOB, chest pain  As per H and P:  "82 years old with above history presents with complaints of chest pain and shortness of breath. Her history is very vague and none of her complaints seem acute. She states that she's been having left shoulder pain which radiates towards her left chest ever since she feel and dislocated he  shoulder. Her shortness of breath is chronic due her COPD, but she noticed it getting worse over the past week and a half, however she only decided to come to the hospital now. She states that she's able to sleep flat at home."    Patient Active Problem List   Diagnosis   . Chronic obstructive pulmonary disease with (acute) exacerbation   . Acute  respiratory failure with hypoxia   . Lactic acidosis   . Acute bronchitis due to Rhinovirus   . Sepsis, due to unspecified organism   . Hypoxia   . Hyponatremia   . Leukocytosis   . Anxiety   . GERD (gastroesophageal reflux disease)   . HLD (hyperlipidemia)   . HTN (hypertension)   . Hypothyroid   . PMR (polymyalgia rheumatica)   . OSA (obstructive sleep apnea)   . Diabetes mellitus, type II   . Generalized weakness   . DOE (dyspnea on exertion)   . Constipation, unspecified constipation type   . Acute respiratory failure   . SOB (shortness of breath)        Past Medical/Surgical History:  Past Medical History:   Diagnosis Date   . Anemia    . Anxiety    . Arthritis    . 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 Chronic cholecystitis with calculus 10/31/2015   . Chronic obstructive pulmonary disease    . Gastroesophageal reflux disease    . Hiatal hernia    . Hyperlipidemia    . Hypertension    . Hypothyroidism    . Incontinent of urine 2014    -WEARS DIAPERS-WILL BRING HER OWN   . Low back pain    . Lymphedema of right lower extremity    . PMR (polymyalgia rheumatica) 01/07/2017   . Polymyalgia rheumatica 09/2014   . Sleep apnea     USES CPAP NIGHTLY   . Type 2 diabetes mellitus, controlled    . Type II or unspecified type diabetes mellitus without mention of complication, not stated as uncontrolled    . Venous insufficiency of right leg 2016    also has lymphedema-KEEPS HER LEGS WRAPPED EXCEPT FOR WHEN SHE BATHES      Past Surgical History:   Procedure Laterality Date   . BACK SURGERY  2014   . CARPAL TUNNEL RELEASE Bilateral 1978   . CLOSED REDUCTION SHOULDER DISLOCATION     . HYSTERECTOMY  1985   . RIGHT & LEFT HEART CATH  W/ CORONARY ANGIOS, LV/LA Bilateral 01/26/2018    Procedure: Right & Left Heart Cath  w/ Coronary Angios, LV/LA;  Surgeon: Elbert Ewings, MD;  [facility];  Service: Cardiovascular;  Laterality: Bilateral;   . ROBOTIC, CHOLECYSTECTOMY N/A 10/31/2015    Procedure:  ROBOT ASSISTED, LAPAROSCOPIC, CHOLECYSTECTOMY, UMBILICAL HERNIA REPAIR;  Surgeon: Alen Blew, MD;  Location: [place] [facility] OR;  Service: General;  Laterality: N/A;         X-Rays/Tests/Labs:  Chest 2 Views    Result Date: 04/20/2018  Mild cardiomegaly and mild central vascular congestion. Minimal basilar atelectasis. Clide Cliff, MD 04/20/2018 6:48 PM    Ct Angiogram Chest    Result Date: 04/21/2018  1. No pulmonary emboli identified. 2. Scattered groundglass opacity which is nonspecific. There is posterior atelectasis most prominent in the upper lobes. Olen Pel, MD 04/21/2018 4:37 AM    Social History:  Prior Level of Function  Prior level of function: Ambulates  with assistive device, Needs assistance with ADLs  Baseline Activity Level: Household ambulation  Driving: does not drive  Cooking: No  DME Currently at Home: Single point cane, Four wheel walker, Electric scooter  Home Living Arrangements  Living Arrangements: Other (Comment) (ALF)  Type of Home: Apartment  Home Layout: One level, Elevator  Bathroom Shower/Tub: Pension scheme manager: Midwife: Grab bars in shower, Built-in shower seat, Grab bars around toilet  DME Currently at Home: Single point cane, Four wheel walker, Electric scooter      Subjective:    Patient is agreeable to participation in the therapy session. Nursing clears patient for therapy.   Patient Goal  Patient Goal: "get better'  Pain Assessment  Pain Assessment: No/denies pain    Objective:   Observation of Patient/Vital Signs:  Patient is in bed, O2 on 3 L with peripheral IV in place.    Cognition/Neuro Status  Arousal/Alertness: Appropriate responses to stimuli  Attention Span: Appears intact  Orientation Level: Oriented X4  Memory: Appears intact  Following Commands: Follows multistep commands consistently  Safety Awareness: minimal verbal instruction  Insights: Educated in safety awareness  Behavior: calm;cooperative  Gross ROM  Right Lower  Extremity ROM: within functional limits  Left Lower Extremity ROM: within functional limits  Gross Strength  Right Lower Extremity Strength: within functional limits  Left Lower Extremity Strength: within functional limits       Functional Mobility  Supine to Sit: Supervision  Scooting to EOB: Supervision  Sit to Supine: Supervision  Sit to Stand: Risk analyst  Stand to Sit: Nurse, learning disability  Ambulation: Risk analyst (HHA)  Distance Walked (ft) (Step 6,7): 10 Feet  PMP Activity: Step 6 - Walks in Room     Balance  Balance: needs focused assessment  Standing - Static: Fair  Standing - Dynamic: Fair    Participation and Endurance  Participation Effort: good         AM-PACT Inpatient Short Forms  Inpatient AM-PACT Performed? (PT): Basic Mobility Inpatient Short Form  AM-PACT "6 Clicks" Basic Mobility Inpatient Short Form  Turning Over in Bed: A little  Sitting Down On/Standing From Armchair: A little  Lying on Back to Sitting on Side of Bed: A little  Assist Moving to/from Bed to Chair: A little  Assist to Walk in Hospital Room: A little  Assist to Climb 3-5 Steps with Railing: A little  PT Basic Mobility Raw Score: 18  CMS 0-100% Score: 46.58%    Treatment Activities: PT evaluation, Energy conservation technique, demonstrated and performed Therapeutic exs : AP, LAQ's, Hip fl, Ankle pumps, Pt was educated on Increasing activity and getting assist for OOB  Mobility. Encouraged to perform LE therex throughout the day to decrease effects of immobility. Encouraged to ambulate with RN staff to maintain and improve functional mobility. Pt educated on safety awareness and fall prevention strategies.Pt educated on getting up slowly from lying down to sitting at the EOB and wait for 30 -45 seconds prior to standing in case of dizziness in order to reduce fall risk.  Pt verbalized understanding for all education provided.  .  Educated the patient to role of physical therapy, plan of care, goals  of therapy and safety with mobility and ADLs.    At end of session pt seated upright in chair, call bell and items in reach. RN aware.    Carolan Shiver, PT

## 2018-04-21 NOTE — Plan of Care (Signed)
Problem: Safety  Goal: Patient will be free from injury during hospitalization  Outcome: Progressing   04/21/18 1449   Goal/Interventions addressed this shift   Patient will be free from injury during hospitalization  Assess patient's risk for falls and implement fall prevention plan of care per policy;Provide and maintain safe environment;Use appropriate transfer methods;Ensure appropriate safety devices are available at the bedside;Include patient/ family/ care giver in decisions related to safety;Hourly rounding     Goal: Patient will be free from infection during hospitalization  Outcome: Progressing   04/21/18 1449   Goal/Interventions addressed this shift   Free from Infection during hospitalization Assess and monitor for signs and symptoms of infection;Monitor lab/diagnostic results   Patient with history of falls. Discussed falls precautions. Verbalized understanding. Call light in reach. Alarm set. Floor mat down.

## 2018-04-21 NOTE — Consults (Signed)
Consults              PULMONARY CONSULTATION                              "[facility]"    Date Time: 04/21/18 9:01 AM  Patient Name: Rachael [name]  Attending Physician: Bartolo Darter, MD  Reason for Consultation: Dyspnea, COPD, CHF, Acute hypoxia    Assessment:   1. Dyspnea - multifactorial; COPD, significant deconditioning, and mild pulmonary congestion secondary to acute on chronic CHF. BNP 220 on admission. Patient is on Bumex at home. Given Lasix IV in ER; now on Bumex 2 mg IV BID.   2. COPD with possible mild exacerbation. Patient is on Symbicort 160 1 buff bid and Spiriva 18 at home. She uses nebulizer up to 4 times per day.   3. Severe OSA. On CPAP at 9 cmH20 with oxygen at 2lpm. However, patient states she has not had oxygen at [facility].   4. PMR. On prednisone 5 mg daily per office note, however, patient thinks she is on a higher dose.  5. CHF, acute on chronic.  6. Venous stasis, chronic.    Plan:   - Continue Duonebs every 4 hours.  - ICS/LABA/LAMA.  - Wean oxygen to maintain saturation > 89%.  - Recent cardiac work up showed non obstructive CAD and a normal ejection fraction per Dr. Alanson Aly.  - Incentive spirometry 10 x hourly.  - Diureses per cardiology.    History of Present Illness:   Rachael [name] is a 82 y.o. female who presents to the hospital with weakness, shortness of breath, and hypoxia x 1 week. She has a PMHx of COPD Gold I/II, group B, PMR, Venous stasis, and severe OSA with AHI 33, and mild desaturation.  Patient is currently on CPAP of 9 cm H2O  with great compliance per recent office note.     Her last hospital admission for CHF exacerbation was in September of 2018.  Her COPD has been fairly stable and patient usually expresses dyspnea. She has limited mobility, and deconditioning is certainly contributing to her dyspnea.     Patient underwent cardiac catheterization March 2019 that showed nonobstructive CAD with mild aortic stenosis. She  had a fall last month that resulted in a dislocated left shoulder. She reports that she noted left sided chest pain over the past week, and was concerned that there was something "wrong with my heart."    She had no leukocytosis. D-dimer mildly elevated on admission. CTA was performed last night. CTA showed no PE. Ground glass opacity is likely secondary to acute CHF.  CXR shows mild pulmonary vascular congestion.         Past Medical History:         Past Surgical History:     Past Surgical History:   Procedure Laterality Date   . BACK SURGERY  2014   . CARPAL TUNNEL RELEASE Bilateral 1978   . CLOSED REDUCTION SHOULDER DISLOCATION     . HYSTERECTOMY  1985   . RIGHT & LEFT HEART CATH  W/ CORONARY ANGIOS, LV/LA Bilateral 01/26/2018    Procedure: Right & Left Heart Cath  w/ Coronary Angios, LV/LA;  Surgeon: Elbert Ewings, MD;  Location: [facility]ARD[facility] C[facility]EP;  Service: Cardiovascular;  Laterality: Bilateral;   . ROBOTIC, CHOLECYSTECTOMY N/A 10/31/2015    Procedure: ROBOT ASSISTED, LAPAROSCOPIC, CHOLECYSTECTOMY, UMBILICAL HERNIA REPAIR;  Surgeon: Dorethea Clan  Greig Castilla, MD;  [facility];  Service: General;  Laterality: N/A;       Family History:   History reviewed. No pertinent family history.    Social History:     Social History     Social History   . Marital status: Widowed     Spouse name: N/A   . Number of children: N/A   . Years of education: N/A     Social History Main Topics   . Smoking status: Former Smoker     Packs/day: 1.00     Years: 25.00     Quit date: 11/16/1998   . Smokeless tobacco: Never Used   . Alcohol use 1.8 oz/week     3 Glasses of wine per week      Comment: social   . Drug use: No   . Sexual activity: Not on file     Other Topics Concern   . Not on file     Social History Narrative   . No narrative on file       Allergies:     Allergies   Allergen Reactions   . Tolectin [Tolmetin] Swelling   . Lyrica [Pregabalin]      tremors   . Nsaids    . Latex Itching       Medications:      Prescriptions Prior to Admission   Medication Sig   . albuterol-ipratropium (DUO-NEB) 2.5-0.5(3) mg/3 mL nebulizer Take 3 mLs by nebulization 4 (four) times daily as needed.   . ALPRAZolam (XANAX) 0.25 MG tablet Take 0.25 mg by mouth nightly as needed.   Marland Kitchen aspirin 81 MG chewable tablet Chew 1 tablet (81 mg total) by mouth daily.   . bisacodyl (BISCOLAX) 10 mg suppository Place 10 mg rectally daily.   . bumetanide (BUMEX) 1 MG tablet Take 2 mg by mouth 2 (two) times daily       . celecoxib (CELEBREX) 200 MG capsule Take 200 mg by mouth daily.       . cetirizine (ZYRTEC) 10 MG tablet Take 10 mg by mouth daily.   . dilTIAZem (CARDIZEM CD) 300 MG 24 hr capsule Take 1 capsule (300 mg total) by mouth daily.   . DULoxetine (CYMBALTA) 60 MG capsule Take 60 mg by mouth 2 (two) times daily.   Marland Kitchen gabapentin (NEURONTIN) 300 MG capsule Take 900 mg by mouth 2 (two) times daily.       Marland Kitchen guaiFENesin (MUCINEX) 600 MG 12 hr tablet Take 1 tablet (600 mg total) by mouth every 12 (twelve) hours. (Patient taking differently: Take 600 mg by mouth 2 (two) times daily.    )   . hydrALAZINE (APRESOLINE) 25 MG tablet Take 1 tablet by mouth 2 (two) times daily.   Marland Kitchen HYDROcodone-acetaminophen (NORCO) 5-325 MG per tablet Take 1 tablet by mouth every 4 (four) hours as needed for Pain. (Patient taking differently: Take 1 tablet by mouth every 6 (six) hours.    )   . insulin glargine (LANTUS) 100 UNIT/ML injection Inject 18 Units into the skin nightly.   . insulin glargine (LANTUS) 100 UNIT/ML injection Inject 28 Units into the skin every morning       . insulin lispro (HUMALOG) 100 UNIT/ML injection Inject 1-8 Units into the skin 3 (three) times daily before meals as needed (High blood glucose. See administration instructions.). (Patient taking differently: Inject 2-10 Units into the skin 4 (four) times daily.    )   . lactobacillus acidophilus &  bulgar (LACTINEX) chewable tablet Chew 1 tablet by mouth 2 (two) times daily.       Marland Kitchen levothyroxine  (SYNTHROID, LEVOTHROID) 50 MCG tablet Take 50 mcg by mouth Once a day at 6:00am.   . lidocaine (LIDODERM) 5 % Place 1 patch onto the skin daily.Remove & Discard patch within 12 hours or as directed by MD   . loperamide (IMODIUM) 2 MG capsule Take 2 mg by mouth as needed for Diarrhea.   . loteprednol (LOTEMAX) 0.2 % Suspension Place 1 drop into both eyes 4 (four) times daily.       . metFORMIN (GLUCOPHAGE-XR) 750 MG 24 hr tablet Take 750 mg by mouth nightly.   . metoprolol succinate (TOPROL-XL) 100 MG 24 hr tablet Take 100 mg by mouth daily.   . naloxone (NARCAN) 4 MG/0.1ML nasal spray 1 spray by Nasal route once as needed.For signs of opioid overdose.   . neomycin-bacitracin-polymyxin (NEOSPORIN) ointment Apply topically 2 (two) times daily.   Marland Kitchen oxyCODONE (OXYCONTIN) 10 MG 12 hr tablet Take 10 mg by mouth every 12 (twelve) hours.   . pantoprazole (PROTONIX) 40 MG tablet Take 40 mg by mouth daily.      . petrolatum (AQUAPHOR) ointment Apply topically as needed.   . Pitavastatin Calcium (LIVALO) 2 MG Tab Take 1 tablet by mouth nightly.       Bertram Gala Glycol-Propyl Glycol (SYSTANE ULTRA OP) Apply 1 drop to eye 4 (four) times daily.       . polyethylene glycol (MIRALAX) packet Take 17 g by mouth daily.   . potassium chloride (K-TAB,KLOR-CON) 10 MEQ tablet Take 20 mEq by mouth daily.       . raNITIdine (ZANTAC) 150 MG tablet Take 150 mg by mouth 2 (two) times daily.   . SYMBICORT 160-4.5 MCG/ACT inhaler Inhale 2 puffs into the lungs 2 (two) times daily.      Marland Kitchen tiotropium (SPIRIVA) 18 MCG inhalation capsule Place 18 mcg into inhaler and inhale daily.   . traZODone (DESYREL) 100 MG tablet Take 50 mg by mouth nightly.       Marland Kitchen UNABLE TO FIND Take 1 capsule by mouth 2 (two) times daily.Med Name: Tristan Schroeder     Scheduled Meds:  Current Facility-Administered Medications   Medication Dose Route Frequency   . aspirin  81 mg Oral Daily   . atorvastatin  20 mg Oral QHS   . bumetanide  2 mg Intravenous BID   . carboxymethylcellulose  (PF)  1 drop Both Eyes QID   . cetirizine  10 mg Oral Daily   . famotidine  20 mg Oral Daily   . gabapentin  900 mg Oral BID   . hydrALAZINE  25 mg Oral BID   . insulin glargine  14 Units Subcutaneous QHS   . insulin glargine  22 Units Subcutaneous QAM   . insulin lispro  1-3 Units Subcutaneous QHS   . insulin lispro  1-5 Units Subcutaneous TID AC   . levothyroxine  50 mcg Oral Daily at 0600   . lidocaine  1 patch Transdermal Q24H   . metoprolol succinate  100 mg Oral Daily   . oxyCODONE  10 mg Oral Q12H SCH   . polyethylene glycol  17 g Oral Daily   . tiotropium  18 mcg Inhalation QAM   . traZODone  50 mg Oral QHS       Review of Systems:   CONSTITUTIONAL: Appetite is good; No Fever/chills; No Night sweats; + Weight change.  EYES:  No Visual changes; No Eye pain   ENT: No Hearing difficulties; No Ear pain; denies sinus pain or congestion.   CARDIOVASCULAR: + chest pain; No palpitation; +  Peripheral edema   RESPIRATORY: Per HPI  GASTROINTESTINAL: No Abdominal pain; No Nausea/vomiting; No Change in bowel habits   GENITOURINARY: No Dysuria; + frequency  MUSCULOSKELETAL: Bilateral shoulder and hip pain.  SKIN: No Rash   NEUROLOGICAL: No Mental status changes; No Motor weakness; No Sensory changes   PSYCHOLOGICAL: No Depression; +  Anxiety   ENDOCRINE: No Polyuria/polydipsia; No Heat/Cold intolerance   HEMATOLOGY/LYMPHATIC: + Easy bleeding/bruising; No Swollen nodes   ALLERGY/IMMUNOLOGY: No Allergic reaction    Physical Exam:     Vitals:    04/21/18 0815   BP:    Pulse:    Resp:    Temp:    SpO2: 91%     Body mass index is 37.37 kg/m.     General: awake, alert, oriented x 3; in mild acute distress; able to speak in complete sentences without accessory muscle use. Has nasal cannula with oxygen at 3lpm.  HEENT: pupils equal with EOMI; no thyromegaly, no cervical LN, no thrush.  Cardiovascular: S1 and S2 regular with GII/VI SEM.   Lungs: Diminished in the bases,  Bibasilar rales.  Abdomen: soft, non-tender, mild  distention.  Extremities: +edema.  Neuro: Normal sensory and motor systems. .  Derm: No rashes.    Intake and Output Summary (Last 24 hours) at Date Time    Intake/Output Summary (Last 24 hours) at 04/21/18 0901  Last data filed at 04/21/18 0815   Gross per 24 hour   Intake              557 ml   Output              300 ml   Net              257 ml       Labs:     Results     Procedure Component Value Units Date/Time    Cell MorpHology [[id number]]  (Abnormal) Collected:  04/21/18 0731     Updated:  04/21/18 0831     Cell Morphology: Abnormal (A)     Platelet Estimate Increased (A)     Polychromasia Present (A)     Ovalocytes Present (A)     Dual RBC population Present (A)    Narrative:       hard stick  07:16  04/21/2018    CBC without differential [[id number]]  (Abnormal) Collected:  04/21/18 0731    Specimen:  Blood from Blood Updated:  04/21/18 0830     WBC 9.32 x10 3/uL      Hgb 8.6 (L) g/dL      Hematocrit 74.2 (L) %      Platelets 410 (H) x10 3/uL      RBC 4.11 x10 6/uL      MCV 75.2 (L) fL      MCH 20.9 (L) pg      MCHC 27.8 (L) g/dL      RDW 20 (H) %      MPV 8.9 fL      Nucleated RBC 0.0 /100 WBC      Absolute NRBC 0.00 x10 3/uL     Narrative:       hard stick  07:16  04/21/2018    B-type Natriuretic Peptide [[id number]]  (Abnormal) Collected:  04/21/18 0731    Specimen:  Blood Updated:  04/21/18 0810     B-Natriuretic Peptide 335.6 (H) pg/mL     Narrative:       hard stick  07:16  04/21/2018  hard stick  07:16  04/21/2018    Basic Metabolic Panel [[id number]]  (Abnormal) Collected:  04/21/18 0731    Specimen:  Blood Updated:  04/21/18 0806     Glucose 230 (H) mg/dL      BUN 10.2 mg/dL      Creatinine 0.8 mg/dL      Calcium 8.9 mg/dL      Sodium 725 mEq/L      Potassium 3.6 mEq/L      Chloride 101 mEq/L      CO2 30 (H) mEq/L      Anion Gap 12.0    Narrative:       hard stick  07:16  04/21/2018    GFR [[id number]] Collected:  04/21/18 0731     Updated:  04/21/18 0806     EGFR >60.0    Narrative:       hard stick   07:16  04/21/2018    Hemoglobin A1c [[id number]] Collected:  04/21/18 0731    Specimen:  Blood Updated:  04/21/18 0731    Glucose Whole Blood - POCT [[id number]]  (Abnormal) Collected:  04/21/18 0722     Updated:  04/21/18 0726     POCT - Glucose Whole blood 207 (H) mg/dL     Glucose Whole Blood - POCT [[id number]]  (Abnormal) Collected:  04/20/18 2241     Updated:  04/20/18 2244     POCT - Glucose Whole blood 202 (H) mg/dL     TSH [[id number]] Collected:  04/20/18 1755    Specimen:  Blood Updated:  04/20/18 2230     TSH 0.62 uIU/mL     D-Dimer [[id number]]  (Abnormal) Collected:  04/20/18 1827     Updated:  04/20/18 1846     D-Dimer 0.82 (H) ug/mL FEU     B-type Natriuretic Peptide [[id number]]  (Abnormal) Collected:  04/20/18 1755    Specimen:  Blood Updated:  04/20/18 1840     B-Natriuretic Peptide 220.3 (H) pg/mL     Troponin I [[id number]] Collected:  04/20/18 1755    Specimen:  Blood Updated:  04/20/18 1824     Troponin I <0.01 ng/mL     Comprehensive metabolic panel [[id number]]  (Abnormal) Collected:  04/20/18 1755    Specimen:  Blood Updated:  04/20/18 1820     Glucose 185 (H) mg/dL      BUN 26.9 mg/dL      Creatinine 0.9 mg/dL      Sodium 485 mEq/L      Potassium 4.3 mEq/L      Chloride 98 (L) mEq/L      CO2 27 mEq/L      Calcium 9.1 mg/dL      Protein, Total 7.1 g/dL      Albumin 3.7 g/dL      AST (SGOT) 20 U/L      ALT 12 U/L      Alkaline Phosphatase 80 U/L      Bilirubin, Total 0.3 mg/dL      Globulin 3.4 g/dL      Albumin/Globulin Ratio 1.1     Anion Gap 17.0 (H)    GFR [[id number]] Collected:  04/20/18 1755     Updated:  04/20/18 1820     EGFR >60.0    CBC with differential [[id number]]  (Abnormal) Collected:  04/20/18 1755  Specimen:  Blood from Blood Updated:  04/20/18 1807     WBC 13.77 (H) x10 3/uL      Hgb 9.1 (L) g/dL      Hematocrit 91.4 (L) %      Platelets 486 (H) x10 3/uL      RBC 4.26 x10 6/uL      MCV 75.6 (L) fL      MCH 21.4 (L) pg      MCHC 28.3 (L) g/dL      RDW 20 (H) %      MPV 9.1 fL       Neutrophils 86.9 %      Lymphocytes Automated 6.8 %      Monocytes 4.9 %      Eosinophils Automated 0.0 %      Basophils Automated 0.1 %      Immature Granulocyte 1.3 %      Nucleated RBC 0.0 /100 WBC      Neutrophils Absolute 11.96 (H) x10 3/uL      Abs Lymph Automated 0.94 x10 3/uL      Abs Mono Automated 0.68 x10 3/uL      Abs Eos Automated 0.00 x10 3/uL      Absolute Baso Automated 0.01 x10 3/uL      Absolute Immature Granulocyte 0.18 (H) x10 3/uL      Absolute NRBC 0.00 x10 3/uL           Rads:   Radiological Procedure reviewed.   Radiology Results (24 Hour)     Procedure Component Value Units Date/Time    CT Angiogram Chest [[id number]] Collected:  04/21/18 0430    Order Status:  Completed Updated:  04/21/18 0441    Narrative:       HISTORY: Dyspnea. Hypoxia. Elevated d-dimer.    PROCEDURE: CT angiogram of the chest was performed using ACR protocol.  MIP images are included. The patient was infused with 100 cc of  Visipaque 320. The following dose reduction techniques were utilized:  automated exposure control and/or adjustment of the mA and/or kV  according to patient's size, and the use of iterative reconstruction  technique.    Marland Kitchen    FINDINGS: Heart size is enlarged. Is no pericardial effusion. Aorta is  normal in caliber without focal aneurysmal dilatation or dissection.  Atherosclerotic calcification plaque is present in the aorta. There are  no enlarged mediastinal or hilar lymph nodes. Dependent changes present  in the lung bases. There is atelectasis in the upper lobes posterior  medially. There is groundglass opacity. No acute abnormalities  identified on limited images of the upper abdomen included on this  study. Area decreased attenuation in the caudate lobe may represent a  cyst. This appears stable when compared to 03/19/2017. Degenerative  changes spine is present.      Impression:         1. No pulmonary emboli identified.  2. Scattered groundglass opacity which is nonspecific. There is   posterior atelectasis most prominent in the upper lobes.    Olen Pel, MD   04/21/2018 4:37 AM    Chest 2 Views [[id number]] Collected:  04/20/18 1847    Order Status:  Completed Updated:  04/20/18 1852    Narrative:       History: Chest pain.    Frontal and lateral views of the chest were obtained and compared to  03/01/2018.    Mild central vascular congestion. Minimal basal atelectasis. No focal  consolidation or pleural effusions. Mild  cardiomegaly.      Impression:         Mild cardiomegaly and mild central vascular congestion. Minimal basilar  atelectasis.    Clide Cliff, MD   04/20/2018 6:48 PM          Signed by: Arnoldo Lenis, APRN, NP-C 04/21/2018 9:01 AM

## 2018-04-21 NOTE — UM Notes (Signed)
Hello    REF# [id number]  Copy of MD order for IP status and admission clinicals for 04/20/18 - 04/21/18  C/b to Wynona Canes [phone]    Thank-you!      COPY of MD ORDER :  04/20/18 1936  Admit to Inpatient (Adult Inpatient Admit Panel ([facility])) Once    Status:    Question Answer Comment   Admitting Physician TIPLER, MIHAELA A    Diagnosis SOB (shortness of breath)    Estimated Length of Stay > or = to 2 midnights    Tentative Discharge Plan? Home or Self Care    Patient Class Inpatient                82 yo female to ED 04/20/18 1706 with CP and SOB. From [place]. Reports chest pain x3 days, states started as left shoulder pain but today it was mostly in her chest. Left shoulder injury 4 weeks ago, states dislocated shoulder after fall. Thought pain was related to injury.   Uses CPAP @ night.  EMS stated [facility] staff reported O2 sat in the 70's, EMS sat 88% on room air. 92% on 4L NC.   98.1, HR 90, RR 24, BP 123/68, sats 87-89%    Past Medical History:   Diagnosis Date   . Anemia    . Anxiety    . Arthritis    . 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 Chronic cholecystitis with calculus 10/31/2015   . Chronic obstructive pulmonary disease    . Gastroesophageal reflux disease    . Hiatal hernia    . Hyperlipidemia    . Hypertension    . Hypothyroidism    . Incontinent of urine 2014    -WEARS DIAPERS-WILL BRING HER OWN   . Low back pain    . Lymphedema of right lower extremity    . PMR (polymyalgia rheumatica) 01/07/2017   . Polymyalgia rheumatica 09/2014   . Sleep apnea     USES CPAP NIGHTLY   . Type 2 diabetes mellitus, controlled    . Type II or unspecified type diabetes mellitus without mention of complication, not stated as uncontrolled    . Venous insufficiency of right leg 2016    also has lymphedema-KEEPS HER LEGS WRAPPED EXCEPT FOR WHEN SHE BATHES     WBC 13.77, H&H 9.1/32.2, Platelets 486, Glucose 185, Chlor 98, AG 17, BNP 220.3, Ddimer .82  CXRY : Mild cardiomegaly and mild central  vascular congestion. Minimal basilar  atelectasis.  ABN EKG : UNDETERMINED RHYTHM NONSPECIFIC T WAVE ABNORMALITY  On exam : + wheezes and edema  In ED : 20mg  IV Lasix, 2.5mg  ALB neb, .5mg  Atrovent neb, 125mg  IV Solu Medrol, O2 4L NC  Admit : Dyspnea, likely multifactorial due to COPD and CHF with resp insufficiency given hypoxemia, tachypnea, congestion on CXRY and elevated BNP    MD assessment/plan :  # Dyspnea - multifactorial due to COPD and mild pulmonary edema due to acute on chronic diastolic heart failure  Last echo in Epic in 01/2017 showing LV ejection fraction of 65% with grade 1 diastolic dysfunction  CXR showing mild central vascular congestion  Elevated BNP at 220  Troponin normal   Patient received Lasix 20 mg IV in the ER, at home she is taking Bumex milligrams twice a day as per med rec but patient is not sure of the doses  Continue with Bumex 2 mg IV times a day  Will  consult cardiology/Dr. Alanson Aly in the morning and will leave up to him if he is to order another echocardiogram  Continue with oxygen supplementation as needed  Monitor I's and O's, fluid restriction   Daily weights  # Left shoulder and left chest pain - this is reproducible by palpation   Patient had a recent fall with left shoulder dislocation and she has intermittent pain since   # Mild leukocytosis- likely reactive  Will monitor   #Diabetes-insulin-dependent  Check hemoglobin A1c  Hold metformin  Continue home doses of Lantus and sliding scale  Diabetic  diet  #Hypertension-seems well controlled  Continue home meds  #Chronic lower extremity lymphedema  Bumex at home  # Chronic anemia - at baseline   #COPD-not in exacerbation  Continue home inhalers  Continue duo nebs as needed  #Chronic low back pain  Continue home pain management  #Hypothyroidism   Check TSH  continue Synthroid  # Elevated D dimer   She has a Well's score of zero , will not not do a chest CTA       Inpatient admission order 04/20/18 1936, tele, 3ml DuoNeb q6, po ASA,  Lipitor, 2mg  IV Bumex BID, po Zyrtec, Pepcid, Breo IH, po Neurontin, Hydralazine, SC Lantus, SC HumaLog, po Synthyroid, Metoprolol, OxyCodone, Miralax, Spiriva IN, po Trazodone, prn Xanax, prn Norco, diet as tol, PULM, fluid rest 1.5L, strict I/O, VS q4, PTOT, RT, Cardio, poss ECHO, supp O2, daily weights          On tele 04/21/18 :  98.5, HR 98, RR 19, BP 143/76, sats 91% O2 4L NC  H&H 8.6/30.9, Platelets 410, BNP 335.6, Glucose 230, Co2 30      PULM consult 04/21/18 :  Assessment:   1. Dyspnea - multifactorial; COPD, significant deconditioning, and mild pulmonary congestion secondary to acute on chronic CHF. BNP 220 on admission. Patient is on Bumex at home. Given Lasix IV in ER; now on Bumex 2 mg IV BID.   2. COPD with possible mild exacerbation. Patient is on Symbicort 160 1 buff bid and Spiriva 18 at home. She uses nebulizer up to 4 times per day.   3. Severe OSA. On CPAP at 9 cmH20 with oxygen at 2lpm. However, patient states she has not had oxygen at [facility].   4. PMR. On prednisone 5 mg daily per office note, however, patient thinks she is on a higher dose.  5. CHF, acute on chronic.  6. Venous stasis, chronic.  Plan:   - Continue Duonebs every 4 hours.  - ICS/LABA/LAMA.  - Wean oxygen to maintain saturation > 89%.  - Recent cardiac work up showed non obstructive CAD and a normal ejection fraction per Dr. Alanson Aly.  - Incentive spirometry 10 x hourly.  - Diureses per cardiology.      Cardio consult 04/21/18 :  Recommendations:   Patient here with chest pain with atypical features and shortness of breath.  Serial cardiac markers for myocardial infarctions. Agree with gentle diuresis.  Cardiac catheterization earlier this year showed nonobstructive CAD normal ejection fraction and moderate pulmonary hypertension probably due to her underlying COPD.  No further cardiac work-up is needed at this time.  Continue gentle diuresis.       MD assessment/plan 04/21/18 :  # Dyspnea - multifactorial due to COPD and mild pulmonary  edema due to acute on chronic diastolic heart failure  Last echo in Epic in 01/2017 showing LV ejection fraction of 65% with grade 1 diastolic dysfunction  CXR showing  mild central vascular congestion  Continue with Bumex 2 mg IV times a day  Consulted  cardiology/Dr. Alanson Aly, no further work up necessary   Continue with oxygen supplementation as needed  Monitor I's and O's, fluid restriction   Daily weights  She is feeling better today, has good urinary output   # Left shoulder and left chest pain - this is reproducible by palpation   Patient had a recent fall with left shoulder dislocation and she has intermittent pain since   # Mild leukocytosis- likely reactive  Will monitor   #Diabetes-insulin-dependent  Hemoglobin A1c 7.4  Hold metformin  Continue home doses of Lantus and sliding scale  Diabetic diet  #Hypertension-seems well controlled  Continue home meds  #Chronic lower extremity lymphedema  Bumex at home  # Chronic anemia - at baseline   #COPD-not in exacerbation  Continue home inhalers  Continue duo nebs as needed  #Chronic low back pain  Continue home pain management  #Hypothyroidism    TSH- wnl   continue Synthroid  # Elevated D dimer   Chest CT negative for PE  DVT Prohylaxis:lovenox

## 2018-04-21 NOTE — Plan of Care (Signed)
Problem: Safety  Goal: Patient will be free from injury during hospitalization  Outcome: Progressing   04/21/18 2000   Goal/Interventions addressed this shift   Patient will be free from injury during hospitalization  Assess patient's risk for falls and implement fall prevention plan of care per policy;Provide and maintain safe environment;Use appropriate transfer methods;Ensure appropriate safety devices are available at the bedside;Include patient/ family/ care giver in decisions related to safety;Assess for patients risk for elopement and implement Elopement Risk Plan per policy;Provide alternative method of communication if needed (communication boards, writing)   Bed locked. Bed in lowest position. Floor mats used. Call light within reach. Bed alarm on.     Comments: Head to toe assessment completed.

## 2018-04-21 NOTE — Progress Notes (Signed)
[facility]  Progress Note  Patient Info:   Date/Time: 04/21/2018 / 2:39 PM   Admit Date:04/20/2018  Patient Name:Rachael [name]   ZOX:[id number]   PCP: Kerin Perna, DO  Attending Physician:Dalilah Curlin, Marily Lente, MD     Assessment and Plan:     82 years old woman with history of COPD, GERD, HLD, HTN, hypothyroidism, chronic low back pain, DM insulin dependent presents with complaints of left shoulder pain and shortness of breath.     # Dyspnea - multifactorial due to COPD and mild pulmonary edema due to acute on chronic diastolic heart failure  Last echo in Epic in 01/2017 showing LV ejection fraction of 65% with grade 1 diastolic dysfunction  CXR showing mild central vascular congestion  Continue with Bumex 2 mg IV times a day  Consulted  cardiology/Dr. Alanson Aly, no further work up necessary   Continue with oxygen supplementation as needed  Monitor I's and O's, fluid restriction   Daily weights  She is feeling better today, has good urinary output     # Left shoulder and left chest pain - this is reproducible by palpation   Patient had a recent fall with left shoulder dislocation and she has intermittent pain since     # Mild leukocytosis- likely reactive  Will monitor       #Diabetes-insulin-dependent  Hemoglobin A1c 7.4  Hold metformin  Continue home doses of Lantus and sliding scale  Diabetic  diet    #Hypertension-seems well controlled  Continue home meds    #Chronic lower extremity lymphedema  Bumex at home    # Chronic anemia - at baseline     #COPD-not in exacerbation  Continue home inhalers  Continue duo nebs as needed    #Chronic low back pain  Continue home pain management    #Hypothyroidism    TSH- wnl   continue Synthroid    # Elevated D dimer   Chest CT negative for PE        DVT Prohylaxis:lovenox   Central Line/Foley Catheter/PICC line status: none  Code Status: Prior  Disposition:home  Type of Admission:Inpatient  Expected Date of Discharge: 1-2 days     Hospital Problems:    Active Problems:    Acute respiratory failure    SOB (shortness of breath)    Subjective:   04/21/18 feeling better , breathing more comfortably   Chief Complaint:  Chest Pain and Shortness of Breath    Review of Systems   Constitutional: Negative for chills and fever.   Respiratory: Negative for shortness of breath and wheezing.      Objective:     Vitals:    04/21/18 0409 04/21/18 0815 04/21/18 1026 04/21/18 1308   BP: 143/76  127/73 143/78   Pulse: 98  94 96   Resp: 17  16 17    Temp: 98.2 F (36.8 C)  98.5 F (36.9 C) 97.9 F (36.6 C)   TempSrc:    Temporal Artery   SpO2: 93% 91% 98% 99%   Weight: 83.9 kg (185 lb)      Height:         Physical Exam:   Physical Exam   Constitutional: She is oriented to person, place, and time and well-developed, well-nourished, and in no distress. No distress.   HENT:   Head: Normocephalic and atraumatic.   Eyes: Conjunctivae are normal.   Neck: Neck supple. No JVD present.   Cardiovascular: Normal rate, regular rhythm and intact distal pulses.  Pulmonary/Chest: Effort normal. No respiratory distress. She has no wheezes. She has rales.   Bibasilar rales    Abdominal: Soft. Bowel sounds are normal. She exhibits no distension. There is no tenderness. There is no rebound and no guarding.   Musculoskeletal: She exhibits edema. She exhibits no tenderness.   B/l LE pitting edema, improved from yesterday    Neurological: She is alert and oriented to person, place, and time.   Skin: Skin is warm and dry. She is not diaphoretic.   Multiple ecchymosis    Psychiatric: Mood and affect normal.   Vitals reviewed.    Results of Labs/imaging   Labs and radiology reports have been reviewed.    Hospitalist   Signed by:   Marily Lente Sirr Kabel  04/21/2018 2:39 PM    *This note was generated by the Epic EMR system/ Dragon speech recognition and may contain inherent errors or omissions not intended by the user. Grammatical errors, random word insertions, deletions, pronoun errors and incomplete  sentences are occasional consequences of this technology due to software limitations. Not all errors are caught or corrected. If there are questions or concerns about the content of this note or information contained within the body of this dictation they should be addressed directly with the author for clarification

## 2018-04-21 NOTE — ED Provider Notes (Signed)
Physician/Midlevel provider first contact with patient: 04/20/18 1706         History     Chief Complaint   Patient presents with   . Chest Pain   . Shortness of Breath     CC: Weakness  Back: PMH hypertension, chronic obstructive pulmonary disease, chronic lower extremity lymphedema, diabetes, sleep apnea, anemia, recent left shoulder dislocation, sent to ER for evaluation of weakness/shortness of breath/noted hypoxia with oxygen saturations in the 70's%, that improved to 88% w/ normal mental oxygen.  Patient relates occasional dry cough and wheezing, but denies chest pain/known fever/other complaints or concerns.      The history is provided by the patient and the EMS personnel. History limited by: Mildly poor historian.   Chest Pain   Associated symptoms: shortness of breath    Shortness of Breath   Associated symptoms: chest pain             Past Medical History:   Diagnosis Date   . Anemia    . Anxiety    . Arthritis    . 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 Chronic cholecystitis with calculus 10/31/2015   . Chronic obstructive pulmonary disease    . Gastroesophageal reflux disease    . Hiatal hernia    . Hyperlipidemia    . Hypertension    . Hypothyroidism    . Incontinent of urine 2014    -WEARS DIAPERS-WILL BRING HER OWN   . Low back pain    . Lymphedema of right lower extremity    . PMR (polymyalgia rheumatica) 01/07/2017   . Polymyalgia rheumatica 09/2014   . Sleep apnea     USES CPAP NIGHTLY   . Type 2 diabetes mellitus, controlled    . Type II or unspecified type diabetes mellitus without mention of complication, not stated as uncontrolled    . Venous insufficiency of right leg 2016    also has lymphedema-KEEPS HER LEGS WRAPPED EXCEPT FOR WHEN SHE BATHES       Past Surgical History:   Procedure Laterality Date   . BACK SURGERY  2014   . CARPAL TUNNEL RELEASE Bilateral 1978   . CLOSED REDUCTION SHOULDER DISLOCATION     . HYSTERECTOMY  1985   . RIGHT & LEFT HEART CATH  W/ CORONARY  ANGIOS, LV/LA Bilateral 01/26/2018    Procedure: Right & Left Heart Cath  w/ Coronary Angios, LV/LA;  Surgeon: Elbert Ewings, MD;  Location: [facility];  Service: Cardiovascular;  Laterality: Bilateral;   . ROBOTIC, CHOLECYSTECTOMY N/A 10/31/2015    Procedure: ROBOT ASSISTED, LAPAROSCOPIC, CHOLECYSTECTOMY, UMBILICAL HERNIA REPAIR;  Surgeon: Alen Blew, MD;  Location: [facility];  Service: General;  Laterality: N/A;       History reviewed. No pertinent family history.    Social  Social History   Substance Use Topics   . Smoking status: Former Smoker     Packs/day: 1.00     Years: 25.00     Quit date: 11/16/1998   . Smokeless tobacco: Never Used   . Alcohol use 1.8 oz/week     3 Glasses of wine per week      Comment: social       .     Allergies   Allergen Reactions   . Tolectin [Tolmetin] Swelling   . Lyrica [Pregabalin]      tremors   . Nsaids    . Latex Itching  Home Medications     Med List Status:  In Progress Set By: Curly Shores, RN at 04/20/2018  5:04 PM                albuterol-ipratropium (DUO-NEB) 2.5-0.5(3) mg/3 mL nebulizer     Take 3 mLs by nebulization 4 (four) times daily as needed.     ALPRAZolam (XANAX) 0.25 MG tablet     Take 0.25 mg by mouth nightly as needed.     aspirin 81 MG chewable tablet     Chew 1 tablet (81 mg total) by mouth daily.     bisacodyl (BISCOLAX) 10 mg suppository     Place 10 mg rectally daily.     bumetanide (BUMEX) 1 MG tablet     Take 2 mg by mouth 2 (two) times daily         celecoxib (CELEBREX) 200 MG capsule     Take 200 mg by mouth daily.         cetirizine (ZYRTEC) 10 MG tablet     Take 10 mg by mouth daily.     dilTIAZem (CARDIZEM CD) 300 MG 24 hr capsule     Take 1 capsule (300 mg total) by mouth daily.     DULoxetine (CYMBALTA) 60 MG capsule     Take 60 mg by mouth 2 (two) times daily.     gabapentin (NEURONTIN) 300 MG capsule     Take 900 mg by mouth 2 (two) times daily.         guaiFENesin (MUCINEX) 600 MG 12 hr tablet     Take 1 tablet  (600 mg total) by mouth every 12 (twelve) hours.     Patient taking differently:  Take 600 mg by mouth 2 (two) times daily.         hydrALAZINE (APRESOLINE) 25 MG tablet     Take 1 tablet by mouth 2 (two) times daily.     HYDROcodone-acetaminophen (NORCO) 5-325 MG per tablet     Take 1 tablet by mouth every 4 (four) hours as needed for Pain.     Patient taking differently:  Take 1 tablet by mouth every 6 (six) hours.         insulin glargine (LANTUS) 100 UNIT/ML injection     Inject 18 Units into the skin nightly.     insulin glargine (LANTUS) 100 UNIT/ML injection     Inject 28 Units into the skin every morning         insulin lispro (HUMALOG) 100 UNIT/ML injection     Inject 1-8 Units into the skin 3 (three) times daily before meals as needed (High blood glucose. See administration instructions.).     Patient taking differently:  Inject 2-10 Units into the skin 4 (four) times daily.         lactobacillus acidophilus & bulgar (LACTINEX) chewable tablet     Chew 1 tablet by mouth 2 (two) times daily.         levothyroxine (SYNTHROID, LEVOTHROID) 50 MCG tablet     Take 50 mcg by mouth Once a day at 6:00am.     lidocaine (LIDODERM) 5 %     Place 1 patch onto the skin daily.Remove & Discard patch within 12 hours or as directed by MD     loperamide (IMODIUM) 2 MG capsule     Take 2 mg by mouth as needed for Diarrhea.     loteprednol (LOTEMAX) 0.2 % Suspension     Place  1 drop into both eyes 4 (four) times daily.         metFORMIN (GLUCOPHAGE-XR) 750 MG 24 hr tablet     Take 750 mg by mouth nightly.     metoprolol succinate (TOPROL-XL) 100 MG 24 hr tablet     Take 100 mg by mouth daily.     naloxone (NARCAN) 4 MG/0.1ML nasal spray     1 spray by Nasal route once as needed.For signs of opioid overdose.     neomycin-bacitracin-polymyxin (NEOSPORIN) ointment     Apply topically 2 (two) times daily.     oxyCODONE (OXYCONTIN) 10 MG 12 hr tablet     Take 10 mg by mouth every 12 (twelve) hours.     pantoprazole (PROTONIX) 40 MG  tablet     Take 40 mg by mouth daily.        petrolatum (AQUAPHOR) ointment     Apply topically as needed.     Pitavastatin Calcium (LIVALO) 2 MG Tab     Take 1 tablet by mouth nightly.         Polyethyl Glycol-Propyl Glycol (SYSTANE ULTRA OP)     Apply 1 drop to eye 4 (four) times daily.         polyethylene glycol (MIRALAX) packet     Take 17 g by mouth daily.     potassium chloride (K-TAB,KLOR-CON) 10 MEQ tablet     Take 20 mEq by mouth daily.         raNITIdine (ZANTAC) 150 MG tablet     Take 150 mg by mouth 2 (two) times daily.     SYMBICORT 160-4.5 MCG/ACT inhaler     Inhale 2 puffs into the lungs 2 (two) times daily.        tiotropium (SPIRIVA) 18 MCG inhalation capsule     Place 18 mcg into inhaler and inhale daily.     traZODone (DESYREL) 100 MG tablet     Take 50 mg by mouth nightly.         UNABLE TO FIND     Take 1 capsule by mouth 2 (two) times daily.Med Name: Tristan Schroeder               Patient taking differently:             Review of Systems   Unable to perform ROS: Other   Respiratory: Positive for shortness of breath.    Cardiovascular: Positive for chest pain.       Physical Exam    BP: 123/68, Heart Rate: 87, Temp: 98.1 F (36.7 C), Resp Rate: 16, SpO2: 93 %, Weight: 87.5 kg    Physical Exam   Constitutional: She is oriented to person, place, and time. She appears well-developed. No distress.   HENT:   Head: Normocephalic and atraumatic.   Eyes: Pupils are equal, round, and reactive to light. Conjunctivae and EOM are normal.   Neck: Normal range of motion.   Cardiovascular: Normal rate and regular rhythm.    No murmur heard.  Pulmonary/Chest: Effort normal. No respiratory distress. She has wheezes.   Abdominal: Soft. She exhibits no mass. There is no tenderness.   Musculoskeletal: Normal range of motion. She exhibits edema. She exhibits no tenderness.        Right shoulder: She exhibits no deformity.   Lymphadenopathy:     She has no cervical adenopathy.   Neurological: She is alert and oriented to  person, place, and time. She has normal strength.  Skin: Skin is warm. No rash noted.   Psychiatric: She has a normal mood and affect. Her speech is normal.         MDM and ED Course     ED Medication Orders     Start Ordered     Status Ordering Provider    04/20/18 1936 04/20/18 1935  furosemide (LASIX) injection 20 mg  Once     Route: Intravenous  Ordered Dose: 20 mg     Last MAR action:  Given Cache Decoursey JEFFREY    04/20/18 1936 04/20/18 1935  albuterol (PROVENTIL) (2.5 MG/3ML) 0.083% nebulizer solution 2.5 mg  RT - Once     Route: Nebulization  Ordered Dose: 2.5 mg     Last MAR action:  Given Mychael Soots JEFFREY    04/20/18 1936 04/20/18 1935  ipratropium (ATROVENT) 0.02 % nebulizer solution 0.5 mg  RT - Once     Route: Nebulization  Ordered Dose: 0.5 mg     Last MAR action:  Given Kairee Isa JEFFREY    04/20/18 1936 04/20/18 1935  methylPREDNISolone sodium succinate (Solu-MEDROL) injection 125 mg  Once     Route: Intravenous  Ordered Dose: 125 mg     Last MAR action:  Given Anh Bigos JEFFREY             MDM  Number of Diagnoses or Management Options  SOB (shortness of breath): new and requires workup  Diagnosis management comments: EKG (interpreted by myself): ?Sinus rhythm with PACs, normal axis, normal intervals, nonspecific ST changes       Amount and/or Complexity of Data Reviewed  Clinical lab tests: ordered and reviewed  Tests in the radiology section of CPT: ordered and reviewed  Obtain history from someone other than the patient: yes  Discuss the patient with other providers: yes  Independent visualization of images, tracings, or specimens: yes    Risk of Complications, Morbidity, and/or Mortality  Presenting problems: high  Diagnostic procedures: high  Management options: high    Patient Progress  Patient progress: stable        ED Course as of Apr 21 112   Wed Apr 20, 2018   1935 Admission accepted by hospitalist.  [MP]      ED Course User Index  [MP] Herma Ard, MD             Procedures    Clinical Impression & Disposition     Clinical Impression  Final diagnoses:   SOB (shortness of breath)        ED Disposition     ED Disposition Condition Date/Time Comment    Admit  Wed Apr 20, 2018  7:36 PM Admitting Physician: Heloise Ochoa A [[id number]]   Diagnosis: SOB (shortness of breath) [960454]   Estimated Length of Stay: > or = to 2 midnights   Tentative Discharge Plan?: Home or Self Care [1]   Patient Class: Inpatient [101]             Current Discharge Medication List                    Herma Ard, MD  04/21/18 [phone]

## 2018-04-21 NOTE — Progress Notes (Signed)
Assessment completed with patient at bedside. Patient lives at [facility]. She has been living there for about 1 year. She had been using walker for mobility but has not been able to use her walker since she hurt her shoulder in a fall about a month ago. She has an Art gallery manager that she is now using. Patient says she is independent with transfers. She gets help with bathing, dressing, and medications. Her MD comes regularly to ALF to see her. Her daughter lives nearby- will be transport back to [facility] at time of d/c Patient did say her grandson is graduating high school this weekend so may need to coordinate discharge around that. Patient would benefit from PT eval. Will follow for any recommendations after eval.          04/21/18 1447   Patient Type   Within 30 Days of Previous Admission? No   Healthcare Decisions   Interviewed: Patient   Orientation/Decision Making Abilities of Patient Alert and Oriented x3, able to make decisions   Advance Directive Patient has advance directive, copy not in chart   Advance Directive not in Chart Copy requested from family/decision maker   Healthcare Agent Appointed Yes   Healthcare Agent's Name Lavell Anchors daughter   Healthcare Agent's Phone Number [phone]   Prior to admission   Prior level of function Ambulates with assistive device;Needs assistance with ADLs   Type of Residence Assisted living  [facility])   Home Layout One level;Elevator   Have running water, electricity, heat, etc? Yes   Living Arrangements Other (Comment)  (ALF)   How do you get to your MD appointments? MD sees at facility   How do you get your groceries? facility   Who fixes your meals? facility   Who does your laundry? facility   Who picks up your prescriptions? facility provides   Dressing Needs assistance   Grooming Independent   Feeding Independent   Bathing Needs assistance   Toileting Needs assistance   DME Currently at Home Single point cane;Four wheel walker;Electric scooter    Home Care/Community Services None   Prior SNF admission? (Detail) [facility] 2 years ago   Prior Rehab admission? (Detail) no   Adult Protective Services (APS) involved? No   Discharge Planning   Support Systems Family members;Children;Other (Comment)  (ALF staff)   Patient expects to be discharged to: [facility] likely with home health    Anticipated [place] plan discussed with: Same as interviewed   Mode of transportation: Private car (family member)  (daughter)   Consults/Providers   PT Evaluation Needed 1   OT Evalulation Needed 1   SLP Evaluation Needed 2   Outcome Palliative Care Screen Screened but did not meet criteria for intervention   Correct PCP listed in Epic? Yes   PCP   PCP on file was verified as the current PCP? Yes   Important Message from [facility] Notice   Patient received 1st IMM Letter? No

## 2018-04-22 LAB — BASIC METABOLIC PANEL
Anion Gap: 13 (ref 5.0–15.0)
BUN: 17.3 mg/dL (ref 7.0–19.0)
CO2: 31 mEq/L — ABNORMAL HIGH (ref 22–29)
Calcium: 9 mg/dL (ref 7.9–10.2)
Chloride: 99 mEq/L — ABNORMAL LOW (ref 100–111)
Creatinine: 0.8 mg/dL (ref 0.6–1.0)
Glucose: 116 mg/dL — ABNORMAL HIGH (ref 70–100)
Potassium: 3.2 mEq/L — ABNORMAL LOW (ref 3.5–5.1)
Sodium: 143 mEq/L (ref 136–145)

## 2018-04-22 LAB — GLUCOSE WHOLE BLOOD - POCT
Whole Blood Glucose POCT: 107 mg/dL — ABNORMAL HIGH (ref 70–100)
Whole Blood Glucose POCT: 160 mg/dL — ABNORMAL HIGH (ref 70–100)
Whole Blood Glucose POCT: 178 mg/dL — ABNORMAL HIGH (ref 70–100)
Whole Blood Glucose POCT: 207 mg/dL — ABNORMAL HIGH (ref 70–100)

## 2018-04-22 LAB — CBC
Absolute NRBC: 0 10*3/uL (ref 0.00–0.00)
Hematocrit: 31.7 % — ABNORMAL LOW (ref 34.7–43.7)
Hgb: 8.8 g/dL — ABNORMAL LOW (ref 11.4–14.8)
MCH: 21.1 pg — ABNORMAL LOW (ref 25.1–33.5)
MCHC: 27.8 g/dL — ABNORMAL LOW (ref 31.5–35.8)
MCV: 76 fL — ABNORMAL LOW (ref 78.0–96.0)
MPV: 9.1 fL (ref 8.9–12.5)
Nucleated RBC: 0 /100 WBC (ref 0.0–0.0)
Platelets: 382 10*3/uL — ABNORMAL HIGH (ref 142–346)
RBC: 4.17 10*6/uL (ref 3.90–5.10)
RDW: 20 % — ABNORMAL HIGH (ref 11–15)
WBC: 10.1 10*3/uL — ABNORMAL HIGH (ref 3.10–9.50)

## 2018-04-22 LAB — B-TYPE NATRIURETIC PEPTIDE: B-Natriuretic Peptide: 206.4 pg/mL — ABNORMAL HIGH (ref 0.0–100.0)

## 2018-04-22 LAB — GFR: EGFR: >60

## 2018-04-22 LAB — TROPONIN I: Troponin I: 0.01 ng/mL (ref 0.00–0.09)

## 2018-04-22 MED ORDER — POTASSIUM CHLORIDE CRYS ER 20 MEQ PO TBCR
40.00 meq | EXTENDED_RELEASE_TABLET | Freq: Once | ORAL | Status: AC
Start: 2018-04-22 — End: 2018-04-22
  Administered 2018-04-22: 10:00:00 40 meq via ORAL
  Filled 2018-04-22: qty 2

## 2018-04-22 MED ORDER — FUROSEMIDE 10 MG/ML IJ SOLN
20.00 mg | Freq: Once | INTRAMUSCULAR | Status: AC
Start: 2018-04-22 — End: 2018-04-22
  Administered 2018-04-22: 22:00:00 20 mg via INTRAVENOUS
  Filled 2018-04-22: qty 2

## 2018-04-22 NOTE — Consults (Signed)
Hospital Elder Life Program  "Preventing Delirium and Function Decline During Hospitalization"    Rachael [name], has been screened and enrolled in the [facility] Elder Life Program (HELP) on 04/22/18. They will begin to receive interventions carried out by trained volunteers wearing blue jackets that will maintain their cognitive and physical functioning while in the hospital.    Rachael [name] is at increased risk for hospital-acquired delirium and decline because of:     Mobility Impairment - self reported by the patient and/or patient care companion.  Interventions such as; range of motions exercises 3 x daily will be implemented.     Vision Impairment - Interventions such as; clean glasses will be implemented.     Dehydration - as evidenced by BUN/Cr ratio 21.625. Interventions such as; encourage fluids and set-up meals will be implemented.     Sleep Impairment - self reported by the patient and/or patient care companion.  Interventions such as; stress reduction exercises and hand massage will be implemented.    We appreciate your support of HELP and look forward to enhancing your patient's stay at [facility]. Should you have any questions, please feel free to contact me.    Thank you!  Breanna Shorkey P. Tvisha Schwoerer BS  [facility] Elder Life Specialist  [phone]

## 2018-04-22 NOTE — Progress Notes (Signed)
PULSE OXIMETRY TESTING: (For Home Oxygen)    Document patient's oxygen at rest on room air:     _____% on room air, at rest (NO Ranges please)    _____% on Oxygen, at rest at ____LPM via Nasal Canula      IF patient's saturation is 88% OR BELOW on room air, STOP Here,  IF NOT ambulate patient on room air with exertion and document below:    _____% on Room Air, with exertion (must be 88% or below)    _____% on Oxygen, with exertion, at ____LPM via NC    All three(3) tests must be during the same session.    RN:  Please copy and paste above template and document O2 saturations in a PROGRESS NOTE.    Testing to qualify for home Oxygen must be no earlier than 48 hours prior to discharge, or it will need to be repeated.    Please call the home health care liaison at [phone] when completed.  Thanks.

## 2018-04-22 NOTE — Progress Notes (Signed)
PULSE OXIMETRY TESTING:   Document patient's oxygen at rest on room air:   _____% on room air, at rest (No ranges please)   _____% on oxygen, at rest at_____LPM via NC   IF 88% OR BELOW on room air, STOP HERE,   IF NOT ambulate patient on room air with exertion and document below:   _____% on room air, with exertion (must be 88% or below)   _____% on oxygen, with exertion, at_____LPM via NC   All three tests must be during the same session.   Please document in a PROGRESS NOTE.   Testing to qualify for home oxygen must be no earlier than 48 hours prior to discharge, or it will need to be repeated.   Please call the home health care liaison at [phone] when completed.       The nurse, Huntley Dec [phone], is aware of completing the oxygen template to see if patient meets criteria for home oxygen.  .      CM also notified the [facility] at [phone] to follow up with the patient for home health and possible home oxygen.  CM will follow up as needed

## 2018-04-22 NOTE — Progress Notes (Signed)
[facility] HOSPITALIST  Progress Note  Patient Info:   Date/Time: 04/22/2018 / 4:36 PM   Admit Date:04/20/2018  Patient Name:Rachael [name]   TKZ:[id number]   PCP: Kerin Perna, DO  Attending Physician:Kayln Garceau, Marily Lente, MD     Assessment and Plan:     82 years old woman with history of COPD, GERD, HLD, HTN, hypothyroidism, chronic low back pain, DM insulin dependent presents with complaints of left shoulder pain and shortness of breath.     # Dyspnea - multifactorial due to COPD and mild pulmonary edema due to acute on chronic diastolic heart failure  Last echo in Epic in 01/2017 showing LV ejection fraction of 65% with grade 1 diastolic dysfunction  CXR showing mild central vascular congestion  Continue with Bumex 2 mg IV times a day, will switch to PO Bumex tomorrow   Consulted  cardiology/Dr. Alanson Aly , discussed with him   Continue with oxygen supplementation as needed  Monitor I's and O's, fluid restriction   Daily weights  Patient is feeling much better today, but she still has b/l rales and LE edema. Will plan for discharge tomorrow.     # Hypokalemia   Will replace and check again tomorrow     # Left shoulder and left chest pain - this is reproducible by palpation   Patient had a recent fall with left shoulder dislocation and she has intermittent pain since     # Mild leukocytosis- likely reactive  Will monitor       #Diabetes-insulin-dependent  Hemoglobin A1c 7.4  Hold metformin  Continue home doses of Lantus and sliding scale  Diabetic  diet    #Hypertension-seems well controlled  Continue home meds    #Chronic lower extremity lymphedema  Bumex at home    # Chronic anemia - at baseline     #COPD-not in exacerbation  Continue home inhalers  Continue duo nebs as needed    #Chronic low back pain  Continue home pain management    #Hypothyroidism    TSH- wnl   continue Synthroid    # Elevated D dimer   Chest CT negative for PE        DVT Prohylaxis:lovenox   Central Line/Foley Catheter/PICC line  status: none  Code Status: Prior  Disposition:home  Type of Admission:Inpatient  Expected Date of Discharge: 1-2 days     Hospital Problems:   Active Problems:    Acute respiratory failure    SOB (shortness of breath)    Subjective:   04/22/18 feeling much better today     Chief Complaint:  Chest Pain and Shortness of Breath    Review of Systems   Constitutional: Negative for chills and fever.   Respiratory: Negative for shortness of breath and wheezing.      Objective:     Vitals:    04/22/18 0651 04/22/18 0719 04/22/18 1026 04/22/18 1322   BP: 154/87  154/66 118/71   Pulse: 92 96  99   Resp: 18 18  18    Temp: 98.1 F (36.7 C)  98.6 F (37 C) 97.9 F (36.6 C)   TempSrc:    Temporal Artery   SpO2: 97% 96%  93%   Weight: 84.5 kg (186 lb 4.6 oz)      Height:         Physical Exam:   Physical Exam   Constitutional: She is oriented to person, place, and time and well-developed, well-nourished, and in no distress. No distress.  HENT:   Head: Normocephalic and atraumatic.   Eyes: Conjunctivae are normal.   Neck: Neck supple. No JVD present.   Cardiovascular: Normal rate, regular rhythm and intact distal pulses.    Pulmonary/Chest: Effort normal. No respiratory distress. She has no wheezes. She has rales.   Bibasilar rales    Abdominal: Soft. Bowel sounds are normal. She exhibits no distension. There is no tenderness. There is no rebound and no guarding.   Musculoskeletal: She exhibits edema. She exhibits no tenderness.   B/l LE pitting edema, improved from yesterday    Neurological: She is alert and oriented to person, place, and time.   Skin: Skin is warm and dry. She is not diaphoretic.   Multiple ecchymosis    Psychiatric: Mood and affect normal.   Vitals reviewed.    Results of Labs/imaging   Labs and radiology reports have been reviewed.    Hospitalist   Signed by:   Marily Lente Antonae Zbikowski  04/22/2018 4:36 PM    *This note was generated by the Epic EMR system/ Dragon speech recognition and may contain inherent errors or  omissions not intended by the user. Grammatical errors, random word insertions, deletions, pronoun errors and incomplete sentences are occasional consequences of this technology due to software limitations. Not all errors are caught or corrected. If there are questions or concerns about the content of this note or information contained within the body of this dictation they should be addressed directly with the author for clarification

## 2018-04-22 NOTE — Progress Notes (Signed)
[facility]   Phone: [phone]  Fax: [phone]      04/22/18  11:53 AM     Arnoldo Lenis, FNP Nurse Practitioner Signed Pulmonology  Progress Notes   Date of Service: 04/22/2018 10:07 AM Creation Time: 04/22/2018 10:07 AM      Expand All Collapse All    [] Hide copied text  Pulmonary Progress Note  "[facility]"    Date Time: 04/22/18 10:07 AM  Patient Name: Rachael [name]  Attending Physician: Bartolo Darter, MD      Assessment:     1. Dyspnea - multifactorial; COPD, significant deconditioning,and mild pulmonary congestion secondary to acute on chronic CHF. BNP 220 on admission. Patient is on Bumex at home. Given Lasix IV in ER; now on Bumex 2 mg IV BID.  Changed to PO diuretic per Cardiology.  2. COPD with possible mild exacerbation. Patient is on Symbicort 160 1 buff bid and Spiriva 18 at home. She uses nebulizer up to 4 times per day.   3. Severe OSA. On CPAP at 9 cmH20 with oxygen at 2lpm. However, patient states she has not had oxygen at [facility].   4. PMR. On prednisone 5 mg daily per office note, however, patient thinks she is on a higher dose.  5. CHF, acute on chronic.  6. Venous stasis, chronic.    Plan:   -Continue Duonebs every 4 hours.  - ICS/LABA/LAMA.  - Wean oxygen to maintain saturation > 89%.  - Recent cardiac work up showed non obstructive CAD and a normal ejection fraction per Dr. Alanson Aly.  - Incentive spirometry 10 x hourly.  - Diureses per cardiology.  - PT/OT recommended.  - Massage/Cold pack/heat for muscle spasm recommended.  - No further pulmonary work up is needed while in the hospital. Patient should follow up in the office in 2-3 weeks.  Interval History   Rachael [name] reports left shoulder and upper back pain.   No cough. Breathing is stable.      Physical Exam:   Vital signs for last 24 hours:  Temp:  [97 F (36.1 C)-98.5 F (36.9 C)] 98.1 F (36.7 C)  Heart Rate:  [81-99] 92  Resp Rate:  [16-19] 18  BP: (127-154)/(71-87) 154/87     Intake and Output Summary (Last 24 hours) at Date Time    Intake/Output Summary (Last 24 hours) at 04/22/18 1007  Last data filed at 04/22/18 0751   Gross per 24 hour   Intake              681 ml   Output             3100 ml   Net            -2419 ml       General: awake, alert, oriented x 3.  HEENT: PERR; no cervical LN, no thrush.  Cardiovascular:S1, S2 regular, GII/VI SEM  Lungs: Has unlabored breathing. Reduced air entry. Has bibasilar rales.  Abdomen: soft, non-tender, non-distended;  no hepatosplenomegaly, obese.  Extremities: + edema bilateral lower legs.  Neuro: Normal sensory and motor systems.   Psych: Anxious.        Meds:   Scheduled Meds:         Current Facility-Administered Medications   Medication Dose Route Frequency   . albuterol-ipratropium  3 mL Nebulization Q6H SCH   . aspirin  81 mg Oral Daily   . atorvastatin  20 mg Oral QHS   . bumetanide  2 mg Intravenous BID   . carboxymethylcellulose (PF)  1 drop Both Eyes QID   . cetirizine  10 mg Oral Daily   . enoxaparin  40 mg Subcutaneous Q24H SCH   . famotidine  20 mg Oral Daily   . fluticasone furoate-vilanterol  1 puff Inhalation QAM   . gabapentin  900 mg Oral BID   . hydrALAZINE  25 mg Oral BID   . insulin glargine  14 Units Subcutaneous QHS   . insulin glargine  22 Units Subcutaneous QAM   . insulin lispro  1-3 Units Subcutaneous QHS   . insulin lispro  1-5 Units Subcutaneous TID AC   . levothyroxine  50 mcg Oral Daily at 0600   . lidocaine  1 patch Transdermal Q24H   . metoprolol succinate  100 mg Oral Daily   . oxyCODONE  10 mg Oral Q12H SCH   . polyethylene glycol  17 g Oral Daily   . potassium chloride  40 mEq Oral Once   . tiotropium  18 mcg Inhalation QAM   . traZODone  50 mg Oral QHS     Continuous Infusions:  PRN Meds:.ALPRAZolam, Nursing communication: Adult Hypoglycemia Treatment Algorithm **AND** dextrose **AND** dextrose **AND** glucagon (rDNA), HYDROcodone-acetaminophen      Labs:     Labs in the last 24 hours            Results     Procedure Component Value Units Date/Time    Glucose Whole Blood - POCT [[id number]]  (Abnormal) Collected:  04/22/18 0751     Updated:  04/22/18 0804     POCT - Glucose Whole blood 107 (H) mg/dL     B-type Natriuretic Peptide [[id number]]  (Abnormal) Collected:  04/22/18 0612    Specimen:  Blood Updated:  04/22/18 0739     B-Natriuretic Peptide 206.4 (H) pg/mL     CBC without differential [[id number]]  (Abnormal) Collected:  04/22/18 0612    Specimen:  Blood from Blood Updated:  04/22/18 0738     WBC 10.10 (H) x10 3/uL      Hgb 8.8 (L) g/dL      Hematocrit 96.2 (L) %      Platelets 382 (H) x10 3/uL      RBC 4.17 x10 6/uL      MCV 76.0 (L) fL      MCH 21.1 (L) pg      MCHC 27.8 (L) g/dL      RDW 20 (H) %      MPV 9.1 fL      Nucleated RBC 0.0 /100 WBC      Absolute NRBC 0.00 x10 3/uL     Basic Metabolic Panel [[id number]]  (Abnormal) Collected:  04/22/18 0612    Specimen:  Blood Updated:  04/22/18 0735     Glucose 116 (H) mg/dL      BUN 40.1 mg/dL      Creatinine 0.8 mg/dL      Calcium 9.0 mg/dL      Sodium 027 mEq/L      Potassium 3.2 (L) mEq/L      Chloride 99 (L) mEq/L      CO2 31 (H) mEq/L      Anion Gap 13.0    GFR [[id number]] Collected:  04/22/18 0612     Updated:  04/22/18 0735     EGFR >60.0    Glucose Whole Blood - POCT [[id number]]  (Abnormal) Collected:  04/21/18 2130     Updated:  04/21/18 2147  POCT - Glucose Whole blood 219 (H) mg/dL     Hemoglobin Z6X [[id number]]  (Abnormal) Collected:  04/21/18 0731    Specimen:  Blood Updated:  04/21/18 1147     Hemoglobin A1C 7.4 (H) %      Average Estimated Glucose 165.7 mg/dL     Glucose Whole Blood - POCT [[id number]]  (Abnormal) Collected:  04/21/18 1120     Updated:  04/21/18 1134     POCT - Glucose Whole blood 266 (H) mg/dL     Troponin I [[id number]] Collected:  04/21/18 0952    Specimen:  Blood Updated:  04/21/18 1027     Troponin I <0.01 ng/mL               Radiology:         Radiology Results (24 Hour)     ** No results found for the last 24 hours. **            Signed by: Arnoldo Lenis, APRN, NP-C  Date/Time: 04/22/2018 10:07 AM            Lestine Box, RN Registered Nurse Signed   Plan of Care   Date of Service: 04/22/2018 10:44 AM Creation Time: 04/22/2018 10:44 AM         [] Hide copied text  Document patient's oxygen at rest on room air:   ___88__% on room air, at rest (No ranges please)   __91___% on oxygen, at rest at_2____LPM via NC   IF 88% OR BELOW on room air, STOP HERE,  IF NOT ambulate patient on room air with exertion and document below:  _____% on room air, with exertion (must be 88% or below)   _____% on oxygen, with exertion, at_____LPM via NC   All three tests must be during the same session.   Please document in a PROGRESS NOTE.   Testing to qualify for home oxygen must be no earlier than 48 hours prior to discharge, or it will need to be repeated.   Please call the home health care liaison at [phone] when completed.       The nurse, Huntley Dec [phone], is aware of completing the oxygen template to see if patient meets criteria for home oxygen. .     CM also notified the [facility] at [phone] to follow up with the patient for home health and possible home oxygen. CM will follow up as needed        Elbert Ewings, MD Physician Signed Interventional Cardiology  Progress Notes   Date of Service: 04/22/2018 8:56 AM Creation Time: 04/22/2018 8:56 AM         [] Hide copied text  [] Hover for attribution information  [facility]  PROGRESS NOTE  [facility]    Date Time: 04/22/18 8:57 AM  Patient Name: Rachael [name],Rachael [name]     Assessment:         Patient Active Problem List   Diagnosis   . Chronic obstructive pulmonary disease with (acute) exacerbation   . Leukocytosis   . Anxiety   . GERD (gastroesophageal reflux disease)   . HLD (hyperlipidemia)   . HTN (hypertension)   . Hypothyroid   . PMR (polymyalgia rheumatica)   . OSA  (obstructive sleep apnea)   . Diabetes mellitus, type II   . Generalized weakness   . DOE (dyspnea on exertion)   . Acute respiratory failure   . SOB (shortness of breath)     Recommendations:   Ruled  out for myocardial infarction.  Appears euvolemic no evidence of CHF.  Leg swelling is chronic probably lymphedema/venous insufficiency.  Plan of left shoulder pain which is musculoskeletal and treatment as per primary team.  Can switch IV diuresis to p.o.  Keep optimal electrolytes.  No further cardiac work-up is needed while in the hospital.  In the office after discharge in 2 to 4 weeks.  Call with question, will sign off.    Medications:            Current Facility-Administered Medications   Medication Dose Route Frequency   . albuterol-ipratropium  3 mL Nebulization Q6H SCH   . aspirin  81 mg Oral Daily   . atorvastatin  20 mg Oral QHS   . bumetanide  2 mg Intravenous BID   . carboxymethylcellulose (PF)  1 drop Both Eyes QID   . cetirizine  10 mg Oral Daily   . enoxaparin  40 mg Subcutaneous Q24H SCH   . famotidine  20 mg Oral Daily   . fluticasone furoate-vilanterol  1 puff Inhalation QAM   . gabapentin  900 mg Oral BID   . hydrALAZINE  25 mg Oral BID   . insulin glargine  14 Units Subcutaneous QHS   . insulin glargine  22 Units Subcutaneous QAM   . insulin lispro  1-3 Units Subcutaneous QHS   . insulin lispro  1-5 Units Subcutaneous TID AC   . levothyroxine  50 mcg Oral Daily at 0600   . lidocaine  1 patch Transdermal Q24H   . metoprolol succinate  100 mg Oral Daily   . oxyCODONE  10 mg Oral Q12H SCH   . polyethylene glycol  17 g Oral Daily   . potassium chloride  40 mEq Oral Once   . tiotropium  18 mcg Inhalation QAM   . traZODone  50 mg Oral QHS       Subjective:   Denies chest pain, SOB or palpitations.    Physical Exam:   Patient Vitals for the past 12 hrs:   BP Temp Pulse Resp   04/22/18 0651 154/87 98.1 F (36.7 C) 92 18   04/22/18 0158 150/74 97 F (36.1 C) 81 18   04/22/18 0155 - - 87 17    04/21/18 2217 - - 94 -   04/21/18 2132 127/71 97 F (36.1 C) 93 18    84.5 kg (186 lb 4.6 oz) 1.499 m (4\' 11" )   Temp (24hrs), Avg:97.7 F (36.5 C), Min:97 F (36.1 C), Max:98.5 F (36.9 C)    Telemetry reviewed no changes.   Intake and Output Summary (Last 24 hours) at Date Time    Intake/Output Summary (Last 24 hours) at 04/22/18 0857  Last data filed at 04/22/18 0751   Gross per 24 hour   Intake              799 ml   Output             3100 ml   Net            -2301 ml       General Appearance:  Breathing comfortable, no acute distress  Head:  normocephalic  Lungs:  b/l rhonchi   Heart:  S1, S2 regular, GII/VI SEM  Abdomen:  Soft, non-tender, positive bowel sounds  Extremities:  No cyanosis, clubbing +edema  Neurologic:  Alert and oriented x3    Labs:     Recent Labs  Lab 04/21/18  0952 04/20/18  1755   Troponin I <0.01 <0.01               Recent Labs  Lab 04/22/18  0612 04/21/18  0731 04/20/18  1755   WBC 10.10* 9.32 13.77*   Hgb 8.8* 8.6* 9.1*   Hematocrit 31.7* 30.9* 32.2*   Platelets 382* 410* 486*       Recent Labs  Lab 04/22/18  0612 04/21/18  0731 04/20/18  1755   Sodium 143 143 142   Potassium 3.2* 3.6 4.3   Chloride 99* 101 98*   CO2 31* 30* 27   BUN 17.3 16.6 17.7   Creatinine 0.8 0.8 0.9   EGFR >60.0 >60.0 >60.0   Glucose 116* 230* 185*   Calcium 9.0 8.9 9.1     .        Lab Results   Component Value Date    BNP 206.4 (H) 04/22/2018      Imaging:   Radiological Procedure reviewed.      Signed by: Ather Anis,MD, [facility]  04/22/2018, 8:57 AM  [facility]  [phone]    *This note was generated by the Epic EMR system/ Dragon speech recognition and may contain inherent errors or omissions not intended by the user. Grammatical errors, random word insertions, deletions, pronoun errors and incomplete sentences are occasional consequences of this technology due to software limitations. Not all errors are caught or corrected. If there are questions or concerns about the content  of this note or information contained within the body of this dictation they should be addressed directly with the author for clarification

## 2018-04-22 NOTE — Progress Notes (Addendum)
[facility]   Phone: [phone]  Fax: [phone]    04/22/18  12:43 PM    Note:Home oxygen order placed in [employer] . E-fax clinical notes , testing and signed order by doctor . DME continue to follow up on the approve for equipment with DME company.      04/22/18  2:01 PM     Note: Patient approved for Home oxygen @ 1.30 pm. DME Tech updated DME company representative Rasheeda ,patient is possible discharge tomorrow.Weekened [facility] To follow up.

## 2018-04-22 NOTE — Progress Notes (Signed)
Home Health Referral            By [employer], the patient has the right to freely choose a home care provider.  Arrangements have been made with:     A company of the patients choosing. We have supplied the patient with a listing of providers in your area who asked to be included and participate in Medicare.   The preferred provider of your insurance company. Choosing a home care provider other than your insurance company's preferred provider may affect your insurance coverage.      Home Health Discharge Information     Your doctor has ordered Skilled Nursing, Physical Therapy, Occupational Therapy and Home Health Aide in-home service(s) for you while you recuperate at home, to assist you in the transition from hospital to home.    The agency that you or your representative chose to provide the service:  Name of Home Health Agency Placement: [facility]] [phone]    [employer]:  Name of DME Agency: [employer] / [employer] / [facility] / [name] Intake (Home Oxygen 2L via nasal cannula continuous)] [phone]  Upon discharge hospital RT to provide portable Oxygen tank.  Patient/family to call above Home Oxygen company on discharge from the hospital to set up exact delivery time of the oxygen supplies to home.     The above services were set up by:  Clenton Pare  [facility] Liaison)   Phone      [phone]    Additional comments:   If you have not heard from your home health agency within 24-48 hours after discharge please call your agency to arrange a time for your first visit.  For any scheduling concerns or questions related to home health, such as time or date please contact your home health agency at the number listed above.       [name] HOME HEALTH REFERRAL       Patient's Name: Rachael [name], Rachael [name] Rachael [name] Date: 04/24/2018   DOB: [birth year] From:   Esperanza Sheets RN, BSN  Home Health Liaison  [facility]  [phone]     MRN: [id number] For  Physician:PCP Dr Stevan Born MD   Attending Physician: Bartolo Darter, MD For SN,PT, OT, HHA     Contact daughter Lavell Anchors [phone]    Language/Communication Needs:   Lenox Ponds     Isolation Precautions:   No active isolations    Diagnoses:     Patient Active Problem List    Diagnosis Date Noted   . HAcute respiratory failure [J96.00] 04/20/2018   . HSOB (shortness of breath) [R06.02] 04/20/2018   . Constipation, unspecified constipation type [K59.00]    . DOE (dyspnea on exertion) [R06.09] 07/20/2017   . Generalized weakness [R53.1] 02/04/2017   . Hypoxia [R09.02] 01/07/2017   . Hyponatremia [E87.1] 01/07/2017   . Leukocytosis [D72.829] 01/07/2017   . Anxiety [F41.9] 01/07/2017     Chronic   . GERD (gastroesophageal reflux disease) [K21.9] 01/07/2017     Chronic   . HLD (hyperlipidemia) [E78.5] 01/07/2017     Chronic   . HTN (hypertension) [I10] 01/07/2017     Chronic   . Hypothyroid [E03.9] 01/07/2017     Chronic   . PMR (polymyalgia rheumatica) [M35.3] 01/07/2017     Chronic   . OSA (obstructive sleep apnea) [G47.33] 01/07/2017     Chronic   . Diabetes mellitus, type II [E11.9] 01/07/2017     Chronic   .  Sepsis, due to unspecified organism [A41.9]    . Acute bronchitis due to Rhinovirus [J20.6] 03/29/2016   . Chronic obstructive pulmonary disease with (acute) exacerbation [J44.1] 03/25/2016   . Acute respiratory failure with hypoxia [J96.01] 03/25/2016   . Lactic acidosis [E87.2] 03/25/2016       Admission Date:   04/20/2018    Discharge Date:   04/22/2018    Recent Surgery, Date of Surgery, and Provider Performing:     * Surgery not found *    Allergies:     Allergies   Allergen Reactions   . Tolectin [Tolmetin] Swelling   . Lyrica [Pregabalin]      tremors   . Nsaids    . Latex Itching       Height and Weight:     Ht Readings from Last 1 Encounters:   04/20/18 1.499 m (4\' 11" )     Wt Readings from Last 1 Encounters:   04/22/18 84.5 kg (186 lb 4.6 oz)       Immunizations:     There is no  immunization history on file for this patient.    Past Medical History:        Past Medical History:   Diagnosis Date   . Anemia    . Anxiety    . Arthritis    . 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 Chronic cholecystitis with calculus 10/31/2015   . Chronic obstructive pulmonary disease    . Gastroesophageal reflux disease    . Hiatal hernia    . Hyperlipidemia    . Hypertension    . Hypothyroidism    . Incontinent of urine 2014    -WEARS DIAPERS-WILL BRING HER OWN   . Low back pain    . Lymphedema of right lower extremity    . PMR (polymyalgia rheumatica) 01/07/2017   . Polymyalgia rheumatica 09/2014   . Sleep apnea     USES CPAP NIGHTLY   . Type 2 diabetes mellitus, controlled    . Type II or unspecified type diabetes mellitus without mention of complication, not stated as uncontrolled    . Venous insufficiency of right leg 2016    also has lymphedema-KEEPS HER LEGS WRAPPED EXCEPT FOR WHEN SHE BATHES       Past Surgical History:     Past Surgical History:   Procedure Laterality Date   . BACK SURGERY  2014   . CARPAL TUNNEL RELEASE Bilateral 1978   . CLOSED REDUCTION SHOULDER DISLOCATION     . HYSTERECTOMY  1985   . RIGHT & LEFT HEART CATH  W/ CORONARY ANGIOS, LV/LA Bilateral 01/26/2018    Procedure: Right & Left Heart Cath  w/ Coronary Angios, LV/LA;  Surgeon: Elbert Ewings, MD;  Location: [facility];  Service: Cardiovascular;  Laterality: Bilateral;   . ROBOTIC, CHOLECYSTECTOMY N/A 10/31/2015    Procedure: ROBOT ASSISTED, LAPAROSCOPIC, CHOLECYSTECTOMY, UMBILICAL HERNIA REPAIR;  Surgeon: Alen Blew, MD;  Location: [facility];  Service: General;  Laterality: N/A;       Social History:     Social History     Social History   . Marital status: Widowed     Spouse name: N/A   . Number of children: N/A   . Years of education: N/A     Social History Main Topics   . Smoking status: Former Smoker     Packs/day: 1.00     Years: 25.00  Quit date: 11/16/1998   . Smokeless tobacco:  Never Used   . Alcohol use 1.8 oz/week     3 Glasses of wine per week      Comment: social   . Drug use: No   . Sexual activity: Not on file     Other Topics Concern   . Not on file     Social History Narrative   . No narrative on file       Willing and Able Caregiver:   Yes. Daughter Terie Purser lives nearby    [facility] ([place]): Skilled assessment and medication instruction   New home oxygen - [employer]  Date/Time: 04/21/2018 / 2:39 PM   Admit Date:04/20/2018  Patient Name:Rachael [name]   AVW:[id number]   PCP: Kerin Perna, DO  Attending Physician:[name], Rachael Lente, MD     Assessment and Plan:     82 years old woman with history of COPD, GERD, HLD, HTN, hypothyroidism, chronic low back pain, DM insulin dependent presents with complaints of left shoulder pain and shortness of breath.     # Dyspnea - multifactorial due to COPD and mild pulmonary edema due to acute on chronic diastolic heart failure  Last echo in Epic in 01/2017 showing LV ejection fraction of 65% with grade 1 diastolic dysfunction  CXR showing mild central vascular congestion  Continue with Bumex 2 mg IV times a day  Consulted  cardiology/Dr. Alanson Aly, no further work up necessary   Continue with oxygen supplementation as needed  Monitor I's and O's, fluid restriction   Daily weights  She is feeling better today, has good urinary output     # Left shoulder and left chest pain - this is reproducible by palpation   Patient had a recent fall with left shoulder dislocation and she has intermittent pain since     # Mild leukocytosis- likely reactive  Will monitor       #Diabetes-insulin-dependent  Hemoglobin A1c 7.4  Hold metformin  Continue home doses of Lantus and sliding scale  Diabetic diet    #Hypertension-seems well controlled  Continue home meds    #Chronic lower extremity lymphedema  Bumex at home    # Chronic anemia - at baseline     #COPD-not in exacerbation  Continue home inhalers  Continue duo nebs as needed    #Chronic low back  pain  Continue home pain management    #Hypothyroidism    TSH- wnl   continue Synthroid    # Elevated D dimer   Chest CT negative for PE      Recommendations: 6/7 cardiology notes   Ruled out for myocardial infarction.  Appears euvolemic no evidence of CHF.  Leg swelling is chronic probably lymphedema/venous insufficiency.  Plan of left shoulder pain which is musculoskeletal and treatment as per primary team.  Can switch IV diuresis to p.o.  Keep optimal electrolytes.  No further cardiac work-up is needed while in the hospital.  In the office after discharge in 2 to 4 weeks.  Call with question, will sign off.        PHYSICAL THERAPY (PT):   Eval and treat  Precautions and Contraindications:   Precautions  Weight Bearing Status: RUE non weight bearing (As per patient secondary to recent shoulder dislocation )  Other Precautions: falls    Cognition/Neuro Status  Arousal/Alertness: Appropriate responses to stimuli  Attention Span: Appears intact  Orientation Level: Oriented X4  Memory: Appears intact  Following Commands: Follows multistep commands consistently  Safety Awareness: minimal verbal instruction  Insights: Educated in safety awareness  Behavior: calm;cooperative  Gross ROM  Right Lower Extremity ROM: within functional limits  Left Lower Extremity ROM: within functional limits  Gross Strength  Right Lower Extremity Strength: within functional limits  Left Lower Extremity Strength: within functional limits    Functional Mobility  Supine to Sit: Supervision  Scooting to EOB: Supervision  Sit to Supine: Supervision  Sit to Stand: Risk analyst  Stand to Sit: Research scientist (physical sciences)  Ambulation: Risk analyst (HHA)  Distance Walked (ft) (Step 6,7): 10 Feet  PMP Activity: Step 6 - Walks in Room     Balance  Balance: needs focused assessment  Standing - Static: Fair  Standing - Dynamic: Fair    Participation and Endurance  Participation Effort: good      AM-PACT Inpatient Short Forms   Inpatient AM-PACT Performed? (PT): Basic Mobility Inpatient Short Form  AM-PACT "6 Clicks" Basic Mobility Inpatient Short Form  Turning Over in Bed: A little  Sitting Down On/Standing From Armchair: A little  Lying on Back to Sitting on Side of Bed: A little  Assist Moving to/from Bed to Chair: A little  Assist to Walk in Hospital Room: A little  Assist to Climb 3-5 Steps with Railing: A little  PT Basic Mobility Raw Score: 18  CMS 0-100% Score: 46.58%    Treatment Activities: PT evaluation, Energy conservation technique, demonstrated and performed Therapeutic exs : AP, LAQ's, Hip fl, Ankle pumps, Pt was educated on Increasing activity and getting assist for OOB  Mobility. Encouraged to perform LE therex throughout the day to decrease effects of immobility. Encouraged to ambulate with RN staff to maintain and improve functional mobility. Pt educated on safety awareness and fall prevention strategies.Pt educated on getting up slowly from lying down to sitting at the EOB and wait for 30 -45 seconds prior to standing in case of dizziness in order to reduce fall risk.  Pt verbalized understanding for all education provided.  Marland Kitchen      OCCUPATIONAL THERAPY (OT):   Eval.  and treat  Precautions and Contraindications:   Precautions  Weight Bearing Status: RUE non weight bearing (As per patient secondary to recent shoulder dislocation )  Other Precautions: falls    No eval notes    HOME HEALTH AIDE (HHA):   Eval for Personal Care/ Assist with ADL's      Additional Notes:   Home Health face-to-face (FTF) Encounter (Order [id number])   Consult   Date: 04/22/2018 Department: [facility] Ordering/Authorizing: Bartolo Darter, MD   Order Information     Order Date/Time Release Date/Time Start Date/Time End Date/Time   04/22/18 01:20 PM None 04/22/18 01:21 PM 04/22/18 01:21 PM   Order Details     Frequency Duration Priority Order Class   Once 1 occurrence Routine Hospital Performed   Standing Order  Information     Remaining Occurrences Interval Last Released      0/1 Once 04/22/2018            Provider Information     Ordering User Ordering Provider Authorizing Provider   Clenton Pare, RN [name], Rachael Lente, MD [name], Rachael Lente, MD   Attending Provider(s) Admitting Provider PCP   Rachael Ard, MD; [name], Rachael Lente, MD [name], Rachael Lente, MD Behiri, Amr Rexene Edison, DO   Verbal Order Info     Action Created on Order Mode Entered by Responsible Provider Signed by  Signed on   Ordering 04/22/18 1320 Telephone with readback Jarold Motto Debbora Dus, RN [name], Rachael Lente, MD     Comments     Oxygen __2__liters/minute via nasal cannula ontinuously with portability of gas for >99 months    Dr Berniece Andreas NPI: [id number]    COPD J44.9  CHF I50.9         Order Questions     Question Answer Comment   Date of face-to-face (FTF) encounter: 04/22/2018    Medical conditions that necessitate Home Health care: B. Functional impairment due to recent hospitalization/procedure/treatment     C. Risk for complication/infection/pain requiring follow up and monitoring     D. Chronic illness & risk for re-hospitalization due to unstable disease status     E. Exacerbation of disease requiring follow up monitoring     F. New diagnosis & treatment requiring follow up monitoring and management     G. High risk/complex medications requiring instruction and management     H. Multiple new medications requiring management and monitoring    Clinical findings that support the need for Skilled Nursing. SN will: C. Monitor for signs and symptoms of exacerbation of disease and management     D. Review medication reconciliation, manage and educate on use and side effects     G. Educate on new diagnosis, treatment & management to prevent re-hospitalization     H. Assess cardiopulmonary status and monitor for signs &symptoms of exacerbation     I. Educate dietary and or fluid restrictions and weight management     N. Instruct on oxygen use and  safety    Clinical findings that support the need for Physical Therapy. PT will A. Evaluate and treat functional impairment and improve mobility     C. Educate on weight bearing status, stair/gait training, balance & coordination     D. Provide services to help restore function, mobility, and releive pain     E. Educate on functional mobility; bed, chair, sit, stand and transfer activities     F. Perform home safety assessment & develop safe in home exercise program     G. Implement activities to improve stance time, cadence & step length     H. Educate on the safe use of assistive device/ durable medical equipment     I. Instruct on restorative activities to restore ability to perform ADL    Clinical findings support the need for OT (needs SN/PT order).OT will A. Develop in home program to improve ability to perform ADLs     B. Develop restorative program to improve mobility and independence     C. Educate on recovery and maintenance skills     D. Instruct on strategies to compensate for loss of function     E. Educate on basic motor function and reasoning abilities    Clinical findings that support the need for SLP. ST will F. N/A    Per clinical findings, following services are medically necessary: Skilled Nursing     PT     OT    Evidence this patient is homebound because: B. Profound weakness, poor balance/unsteady gait d/t illness/treatment/procedure     C. Decreased endurance, strength, ROM, cadence, safety/judgment during mobility     F. Deconditioned due to advance disease process requiring assistance to leave home     G. Fall risk due to impaired coordination, gait and decreased balance     I. Restricted to home to decrease risk of infection     N. Impaired  mobility d/t pain, arthritis, weakness that compromises patient safety    DME Oxygen    Other (please specify) PCP Dr Stevan Born  Home Health Aide          Process Instructions     Please select Home Care Services medically  necessary.     Based on the above findings, I certify that this patient is confined to the home and needs intermittent skilled nursing care, physical therapry and / or speech therapy or continues to need occupational therapy. The patient is under my care, and I have initiated the establishment of the plan of care. This patient will be followed by a physician who will periodically review the plan of care.         Collection Information     Consult Order Info     ID Description Priority Start Date Start Time   [id number] Home Health face-to-face (FTF) Encounter Routine 04/22/2018 1:21 PM   Provider Specialty Referred to   ______________________________________ _____________________________________   Acknowledgement Info     For At Acknowledged By Acknowledged On   Placing Order 04/22/18 1320 Clenton Pare, RN 04/22/18 1320   Verbal Order Info     Action Created on Order Mode Entered by Responsible Provider Signed by Signed on   Ordering 04/22/18 1320 Telephone with readback Jarold Motto, Debbora Dus, RN [name], Rachael Lente, MD     Patient Information     Patient Name  Tancredi, Lynora Dymond Sex  Female DOB  10/17/1936   Additional Information     Associated Reports External References   Priority and Order Details [employer]

## 2018-04-22 NOTE — Progress Notes (Signed)
Pulmonary Progress Note  "[facility]"    Date Time: 04/22/18 10:07 AM  Patient Name: Rachael [name]  Attending Physician: Bartolo Darter, MD      Assessment:     1. Dyspnea - multifactorial; COPD, significant deconditioning, and mild pulmonary congestion secondary to acute on chronic CHF. BNP 220 on admission. Patient is on Bumex at home. Given Lasix IV in ER; now on Bumex 2 mg IV BID.  Changed to PO diuretic per Cardiology.  2. COPD with possible mild exacerbation. Patient is on Symbicort 160 1 buff bid and Spiriva 18 at home. She uses nebulizer up to 4 times per day.   3. Severe OSA. On CPAP at 9 cmH20 with oxygen at 2lpm. However, patient states she has not had oxygen at [facility].   4. PMR. On prednisone 5 mg daily per office note, however, patient thinks she is on a higher dose.  5. CHF, acute on chronic.  6. Venous stasis, chronic.    Plan:   - Continue Duonebs every 4 hours.  - ICS/LABA/LAMA.  - Wean oxygen to maintain saturation > 89%.  - Recent cardiac work up showed non obstructive CAD and a normal ejection fraction per Dr. Alanson Aly.  - Incentive spirometry 10 x hourly.  - Diureses per cardiology.  - PT/OT recommended.  - Massage/Cold pack/heat for muscle spasm recommended.  - No further pulmonary work up is needed while in the hospital. Patient should follow up in the office in 2-3 weeks.  Interval History   Rachael [name] reports left shoulder and upper back pain.   No cough. Breathing is stable.      Physical Exam:   Vital signs for last 24 hours:  Temp:  [97 F (36.1 C)-98.5 F (36.9 C)] 98.1 F (36.7 C)  Heart Rate:  [81-99] 92  Resp Rate:  [16-19] 18  BP: (127-154)/(71-87) 154/87    Intake and Output Summary (Last 24 hours) at Date Time    Intake/Output Summary (Last 24 hours) at 04/22/18 1007  Last data filed at 04/22/18 0751   Gross per 24 hour   Intake              681 ml   Output             3100 ml   Net            -2419 ml       General: awake, alert,  oriented x 3.  HEENT: PERR; no cervical LN, no thrush.  Cardiovascular: S1, S2 regular, GII/VI SEM  Lungs: Has unlabored breathing. Reduced air entry. Has bibasilar rales.  Abdomen: soft, non-tender, non-distended;  no hepatosplenomegaly, obese.  Extremities: + edema bilateral lower legs.  Neuro: Normal sensory and motor systems.   Psych: Anxious.        Meds:   Scheduled Meds:  Current Facility-Administered Medications   Medication Dose Route Frequency   . albuterol-ipratropium  3 mL Nebulization Q6H SCH   . aspirin  81 mg Oral Daily   . atorvastatin  20 mg Oral QHS   . bumetanide  2 mg Intravenous BID   . carboxymethylcellulose (PF)  1 drop Both Eyes QID   . cetirizine  10 mg Oral Daily   . enoxaparin  40 mg Subcutaneous Q24H SCH   . famotidine  20 mg Oral Daily   . fluticasone furoate-vilanterol  1 puff Inhalation QAM   . gabapentin  900 mg Oral BID   . hydrALAZINE  25 mg Oral BID   . insulin glargine  14 Units Subcutaneous QHS   . insulin glargine  22 Units Subcutaneous QAM   . insulin lispro  1-3 Units Subcutaneous QHS   . insulin lispro  1-5 Units Subcutaneous TID AC   . levothyroxine  50 mcg Oral Daily at 0600   . lidocaine  1 patch Transdermal Q24H   . metoprolol succinate  100 mg Oral Daily   . oxyCODONE  10 mg Oral Q12H SCH   . polyethylene glycol  17 g Oral Daily   . potassium chloride  40 mEq Oral Once   . tiotropium  18 mcg Inhalation QAM   . traZODone  50 mg Oral QHS     Continuous Infusions:  PRN Meds:.ALPRAZolam, Nursing communication: Adult Hypoglycemia Treatment Algorithm **AND** dextrose **AND** dextrose **AND** glucagon (rDNA), HYDROcodone-acetaminophen      Labs:     Results     Procedure Component Value Units Date/Time    Glucose Whole Blood - POCT [[id number]]  (Abnormal) Collected:  04/22/18 0751     Updated:  04/22/18 0804     POCT - Glucose Whole blood 107 (H) mg/dL     B-type Natriuretic Peptide [[id number]]  (Abnormal) Collected:  04/22/18 0612    Specimen:  Blood Updated:  04/22/18 0739      B-Natriuretic Peptide 206.4 (H) pg/mL     CBC without differential [[id number]]  (Abnormal) Collected:  04/22/18 0612    Specimen:  Blood from Blood Updated:  04/22/18 0738     WBC 10.10 (H) x10 3/uL      Hgb 8.8 (L) g/dL      Hematocrit 84.1 (L) %      Platelets 382 (H) x10 3/uL      RBC 4.17 x10 6/uL      MCV 76.0 (L) fL      MCH 21.1 (L) pg      MCHC 27.8 (L) g/dL      RDW 20 (H) %      MPV 9.1 fL      Nucleated RBC 0.0 /100 WBC      Absolute NRBC 0.00 x10 3/uL     Basic Metabolic Panel [[id number]]  (Abnormal) Collected:  04/22/18 0612    Specimen:  Blood Updated:  04/22/18 0735     Glucose 116 (H) mg/dL      BUN 25.3 mg/dL      Creatinine 0.8 mg/dL      Calcium 9.0 mg/dL      Sodium 664 mEq/L      Potassium 3.2 (L) mEq/L      Chloride 99 (L) mEq/L      CO2 31 (H) mEq/L      Anion Gap 13.0    GFR [[id number]] Collected:  04/22/18 0612     Updated:  04/22/18 0735     EGFR >60.0    Glucose Whole Blood - POCT [[id number]]  (Abnormal) Collected:  04/21/18 2130     Updated:  04/21/18 2147     POCT - Glucose Whole blood 219 (H) mg/dL     Hemoglobin P2R [[id number]]  (Abnormal) Collected:  04/21/18 0731    Specimen:  Blood Updated:  04/21/18 1147     Hemoglobin A1C 7.4 (H) %      Average Estimated Glucose 165.7 mg/dL     Glucose Whole Blood - POCT [[id number]]  (Abnormal) Collected:  04/21/18 1120     Updated:  04/21/18 1134     POCT -  Glucose Whole blood 266 (H) mg/dL     Troponin I [[id number]] Collected:  04/21/18 0952    Specimen:  Blood Updated:  04/21/18 1027     Troponin I <0.01 ng/mL           Radiology:     Radiology Results (24 Hour)     ** No results found for the last 24 hours. **            Signed by: Arnoldo Lenis, APRN, NP-C  Date/Time: 04/22/2018 10:07 AM

## 2018-04-22 NOTE — OT Eval Note (Signed)
[facility]  [address]  [place], [place]. [address]    Department of Rehabilitation Services  [phone]    Occupational Therapy Evaluation    Patient: Rachael [name]    MRN#: [id number]     [id number]    Time of treatment: Time Calculation  OT Received On: 04/22/18  Start Time: 8657  Stop Time: 0917  Time Calculation (min): 38 min  OT Visit Number: 1    Consult received for [name] for OT Evaluation and Treatment.  Patient's medical condition is appropriate for Occupational therapy intervention at this time.    Assessment:   Rachael [name] is a 82 y.o. female admitted 04/20/2018.   Brief chart review completed including review of labs, review of imaging, review of vitals and review of H&P and physician progress notes.  Pt's ability to complete ADLs and functional transfers is impaired due to the following deficits:  decreased activity tolerance, decreased balance, decreased insight, decreased judgment, decreased memory, decreased safety awareness, shoulder precautions, decreased strength and transfers .  Pt demonstrates performance deficits with grooming, bathing, dressing, toileting and functional mobility. There are a few comorbidities or other factors that affect plan of care and require modification of task including: assistive device needed for mobility, chronic pain, oxygen dependency and recent fall resulting in L shoulder dislocation (per Patient) with current NWB'ing status L UE.  Pt would continue to benefit from OT to address these deficits and increase functional independence.    Assessment: decreased strength;decreased independence with ADLs;balance deficits;decreased safety awareness;decreased cognition;decreased endurance/activity tolerance     Complexity Chart Review Performance Deficits Clinical Decision Making Hx/Comorbidities Assistance needed   Low Brief 1-3 Limited options None None (or at baseline)   Moderate Expanded 3-5 Several Options 1-2 Min/Mod  assist (not at baseline)   High Extensive 5 or more Multiple options 3 or more Max/dependent (not at baseline     Therapy Diagnosis: generalized weakness, decreased functional mobility  and decreased independence with ADL's due to acute respiratory failure. Without therapy interventions, patient is at risk for falls and decreased independence.    Rehabilitation Potential: Prognosis: With continued OT s/p acute discharge;Good      Plan:   OT Frequency Recommended: 2-3x/wk   Treatment Interventions: ADL retraining;Functional transfer training;UE strengthening/ROM;Endurance training;Patient/Family training;Neuro muscular reeducation     Patient Goal  Patient Goal:  (to go home)    Risks/Benefits/POC Discussed with Pt/Family: With patient    Goals:   Goal Formulation: Patient  Time For Goal Achievement: by time of discharge  ADL Goals  Patient will groom self: Supervision;at sinkside;5 visits  Mobility and Transfer Goals  Pt will transfer bed to toilet: Contact Guard Assist;with single point cane;5 visits                         Discharge Recommendations:   Based on today's session patient's discharge recommendation is the following: Discharge Recommendation: ALF with assistance and supervision with all ADL's and mobility to ensure safety/fall prevention; Home with home health OT          Precautions and Contraindications: Falls Risk   Precautions  Weight Bearing Status: LUE non weight bearing (as per Patient due to recent shoulder dislocation)      Medical Diagnosis: SOB (shortness of breath) [R06.02]    History of Present Illness: Rachael [name] is a 82 y.o. female admitted on 04/20/2018 with "the chief complaint of Chest Pain and Shortness of Breath  82 years old with above history presents with complaints of chest pain and shortness of breath. Her history is very vague and none of her complaints seem acute. She states that she's been having left shoulder pain which radiates towards her left chest ever since she  feel and dislocated he shoulder. Her shortness of breath is chronic due her COPD, but she noticed it getting worse over the past week and a half, however she only decided to come to the hospital now. She states that she's able to sleep flat at home."--as per H & P Note.        Patient Active Problem List   Diagnosis   . Chronic obstructive pulmonary disease with (acute) exacerbation   . Acute respiratory failure with hypoxia   . Lactic acidosis   . Acute bronchitis due to Rhinovirus   . Sepsis, due to unspecified organism   . Hypoxia   . Hyponatremia   . Leukocytosis   . Anxiety   . GERD (gastroesophageal reflux disease)   . HLD (hyperlipidemia)   . HTN (hypertension)   . Hypothyroid   . PMR (polymyalgia rheumatica)   . OSA (obstructive sleep apnea)   . Diabetes mellitus, type II   . Generalized weakness   . DOE (dyspnea on exertion)   . Constipation, unspecified constipation type   . Acute respiratory failure   . SOB (shortness of breath)        Past Medical/Surgical History:  Past Medical History:   Diagnosis Date   . Anemia    . Anxiety    . Arthritis    . 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 Chronic cholecystitis with calculus 10/31/2015   . Chronic obstructive pulmonary disease    . Gastroesophageal reflux disease    . Hiatal hernia    . Hyperlipidemia    . Hypertension    . Hypothyroidism    . Incontinent of urine 2014    -WEARS DIAPERS-WILL BRING HER OWN   . Low back pain    . Lymphedema of right lower extremity    . PMR (polymyalgia rheumatica) 01/07/2017   . Polymyalgia rheumatica 09/2014   . Sleep apnea     USES CPAP NIGHTLY   . Type 2 diabetes mellitus, controlled    . Type II or unspecified type diabetes mellitus without mention of complication, not stated as uncontrolled    . Venous insufficiency of right leg 2016    also has lymphedema-KEEPS HER LEGS WRAPPED EXCEPT FOR WHEN SHE BATHES      Past Surgical History:   Procedure Laterality Date   . BACK SURGERY  2014   . CARPAL TUNNEL  RELEASE Bilateral 1978   . CLOSED REDUCTION SHOULDER DISLOCATION     . HYSTERECTOMY  1985   . RIGHT & LEFT HEART CATH  W/ CORONARY ANGIOS, LV/LA Bilateral 01/26/2018    Procedure: Right & Left Heart Cath  w/ Coronary Angios, LV/LA;  Surgeon: Elbert Ewings, MD;  Location: [facility];  Service: Cardiovascular;  Laterality: Bilateral;   . ROBOTIC, CHOLECYSTECTOMY N/A 10/31/2015    Procedure: ROBOT ASSISTED, LAPAROSCOPIC, CHOLECYSTECTOMY, UMBILICAL HERNIA REPAIR;  Surgeon: Alen Blew, MD;  Location: [facility];  Service: General;  Laterality: N/A;         X-Rays/Tests/Labs:  Impression:     Mild cardiomegaly and mild central vascular congestion. Minimal basilar  atelectasis.    Clide Cliff, MD   04/20/2018  6:48 PM      Social History:  Prior Level of Function  Prior level of function: Ambulates with assistive device, Needs assistance with ADLs  Baseline Activity Level: Household ambulation  Driving: does not drive  Cooking: No  DME Currently at Home: Single point cane, Four wheel walker, Electric scooter  Home Living Arrangements  Living Arrangements: Other (Comment) (ALF)  Type of Home: Apartment  Home Layout: One level, Occupational psychologist Shower/Tub: Pension scheme manager: Midwife: Grab bars in shower, Built-in shower seat, Grab bars around toilet  DME Currently at Home: Single point cane, Four wheel walker, Electric scooter  Home Living - Notes / Comments: Patient reporting she has assistance with all ADL's including shower transfers for safety/fall prevention      Subjective:   Patient is agreeable to participation in the therapy session. Nursing clears patient for therapy.  Subjective: Patient reporting her legs knees feel weak today with standing  Pain Assessment  Pain Assessment: Numeric Scale (0-10)  Pain Score: 6-moderate pain  POSS Score: Awake and Alert  Pain Location: Shoulder  Pain Orientation: Left  Pain Descriptors: Aching;Dull  Effect of Pain on  Daily Activities: mild  Pain Intervention(s): Repositioned (RN notified ).        Objective:   Observation of Patient/Vital Signs:  Patient is seated in a bedside chair with telemetry and peripheral IV, O2 at 2 liters/minute via nasal cannula, external foley catheter in place.         Cognition/Neuro Status  Arousal/Alertness: Appropriate responses to stimuli  Attention Span: Appears intact  Orientation Level: Oriented to person;Oriented to place (unable to recall date)  Memory: Decreased recall of recent events;Decreased recall of precautions;Decreased short term memory (reminders needed to maintain NWB'ing status L UE during the session )  Following Commands: independent  Safety Awareness: minimal verbal instruction  Insights: Decreased awareness of deficits;Educated in safety awareness  Behavior: calm;cooperative  Coordination:  (noted L hand resting tremor)  Hand Dominance: right handed    Gross ROM  Right Upper Extremity ROM: within functional limits  Left Upper Extremity ROM: within functional limits (except L shoulder F NT due to recent injury)  Gross Strength  Right Upper Extremity Strength:  (by observation atleast 3+/5)  Left Upper Extremity Strength:  (Shoulder and elbow NT; grip good)          Sensory  Auditory: intact  Tactile - Light Touch: intact (as per Patient report)  Visual Acuity: wears glasses       Self-care and Home Management  Grooming: Contact Guard Assist;standing at sink;steadying;verbal prompting;wash/dry hands;wash/dry face;teeth care (Patient needed to sit after washing face due to c/o B LE weakness)  LB Dressing: Dependent (to rearrange socks)  Functional Transfers: Contact Guard Assist;toilet transfer;steadying;verbal prompting    Mobility and Transfers  Sit to Stand: Contact Guard Assist;with instruction for hand placement to increase safety (from arm-chair)  Arm-chair <-->Toilet: Minimal Assist (w/ HHA); standing rest breaks needed upon entering and when exiting bathroom for energy  conservation due to SOB noted with exertion  -verbal/tactile instructions for proper hand placement and pacing with all functional transfers for increased safety.        Balance  Static Sitting Balance: good  Static Standing Balance: fair (w/ HHA)  Dynamic Standing Balance: fair (w/ HHA)    Participation and Endurance  Participation Effort: fair  Endurance: Tolerates 30 min exercise with multiple rests  [facility] Dyspnea Scale: 2+ Dyspnea  AM-PACT "6 Clicks" Daily Activity Inpatient Short Form  Inpatient AM-PACT Performed?: yes  Put On/Take Off Lower Body Clothing: Total  Assist with Bathing: Total  Assist with Toileting: A lot  Put On/Take Off Upper Body Clothing: A lot  Assist with Grooming: A little  Assist with Eating: None  OT Daily Activity Raw Score: 13  CMS 0-100% Score: 63.03%         Treatment Activities: Patient instructed in proper pacing with transitional mvmts, encouraging Patient to wait atleast 60 secs. To notice how she feels (lightheaded or dizzy) before attempting to stand/walk for fall prevention. Patient verbalized understanding of same. Patient stood at sinkside for approx. 3 minutes before requesting to sit due to h/o B LE weakness. Patient stating she is unable to stand for long periods at a time normally due to c/o B LE weakness. Recommendations made to place a chair at sinkside upon d/c to home to sit to complete grooming tasks for energy conservation/fall prevention. Patient receptive to same and reports an aide is always with her when performing mobility and ADL's at ALF for safety. Educated in pursed lip breathing techniques via verbal/visual demonstration for increased clarity due to SOB noted with exertion.  Patient educated in importance of OOB sitting for all meals to increase endurance/strength for activities and for pulmonary hygiene. Patient instructed to not push/pull on lift anything in L UE/hand due to NWB'ing restrictions. Patient needed reminders to not use L UE  during the session and states she often times forget to not use L UE. Patient seated in arm-chair at end of session with all needs within reach. Patient instructed to ring for nursing for all needs and appeared receptive to all education provided. Chair Alarm activated for Patient's safety and RN notified of session outcome.      Educated the patient to role of occupational therapy, plan of care, goals of therapy and safety with mobility and ADLs, energy conservation techniques, pursed lip breathing, home safety.    Tennis Ship. Trixie Deis, MS,OTR/L  Pager # [phone]  [phone]

## 2018-04-22 NOTE — Progress Notes (Signed)
[facility]  PROGRESS NOTE  [facility]    Date Time: 04/22/18 8:57 AM  Patient Name: Rachael [name],Rachael [name]     Assessment:     Patient Active Problem List   Diagnosis   . Chronic obstructive pulmonary disease with (acute) exacerbation   . Leukocytosis   . Anxiety   . GERD (gastroesophageal reflux disease)   . HLD (hyperlipidemia)   . HTN (hypertension)   . Hypothyroid   . PMR (polymyalgia rheumatica)   . OSA (obstructive sleep apnea)   . Diabetes mellitus, type II   . Generalized weakness   . DOE (dyspnea on exertion)   . Acute respiratory failure   . SOB (shortness of breath)     Recommendations:   Ruled out for myocardial infarction.  Appears euvolemic no evidence of CHF.  Leg swelling is chronic probably lymphedema/venous insufficiency.  Plan of left shoulder pain which is musculoskeletal and treatment as per primary team.  Can switch IV diuresis to p.o.  Keep optimal electrolytes.  No further cardiac work-up is needed while in the hospital.  In the office after discharge in 2 to 4 weeks.  Call with question, will sign off.    Medications:     Current Facility-Administered Medications   Medication Dose Route Frequency   . albuterol-ipratropium  3 mL Nebulization Q6H SCH   . aspirin  81 mg Oral Daily   . atorvastatin  20 mg Oral QHS   . bumetanide  2 mg Intravenous BID   . carboxymethylcellulose (PF)  1 drop Both Eyes QID   . cetirizine  10 mg Oral Daily   . enoxaparin  40 mg Subcutaneous Q24H SCH   . famotidine  20 mg Oral Daily   . fluticasone furoate-vilanterol  1 puff Inhalation QAM   . gabapentin  900 mg Oral BID   . hydrALAZINE  25 mg Oral BID   . insulin glargine  14 Units Subcutaneous QHS   . insulin glargine  22 Units Subcutaneous QAM   . insulin lispro  1-3 Units Subcutaneous QHS   . insulin lispro  1-5 Units Subcutaneous TID AC   . levothyroxine  50 mcg Oral Daily at 0600   . lidocaine  1 patch Transdermal Q24H   . metoprolol succinate  100 mg Oral Daily   . oxyCODONE  10 mg  Oral Q12H SCH   . polyethylene glycol  17 g Oral Daily   . potassium chloride  40 mEq Oral Once   . tiotropium  18 mcg Inhalation QAM   . traZODone  50 mg Oral QHS       Subjective:   Denies chest pain, SOB or palpitations.    Physical Exam:   Patient Vitals for the past 12 hrs:   BP Temp Pulse Resp   04/22/18 0651 154/87 98.1 F (36.7 C) 92 18   04/22/18 0158 150/74 97 F (36.1 C) 81 18   04/22/18 0155 - - 87 17   04/21/18 2217 - - 94 -   04/21/18 2132 127/71 97 F (36.1 C) 93 18    84.5 kg (186 lb 4.6 oz) 1.499 m (4\' 11" )   Temp (24hrs), Avg:97.7 F (36.5 C), Min:97 F (36.1 C), Max:98.5 F (36.9 C)    Telemetry reviewed no changes.   Intake and Output Summary (Last 24 hours) at Date Time    Intake/Output Summary (Last 24 hours) at 04/22/18 0857  Last data filed at 04/22/18 0751   Gross  per 24 hour   Intake              799 ml   Output             3100 ml   Net            -2301 ml       General Appearance:  Breathing comfortable, no acute distress  Head:  normocephalic  Lungs:  b/l rhonchi   Heart:  S1, S2 regular, GII/VI SEM  Abdomen:  Soft, non-tender, positive bowel sounds  Extremities:  No cyanosis, clubbing +edema  Neurologic:  Alert and oriented x3    Labs:     Recent Labs  Lab 04/21/18  0952 04/20/18  1755   Troponin I <0.01 <0.01               Recent Labs  Lab 04/22/18  0612 04/21/18  0731 04/20/18  1755   WBC 10.10* 9.32 13.77*   Hgb 8.8* 8.6* 9.1*   Hematocrit 31.7* 30.9* 32.2*   Platelets 382* 410* 486*       Recent Labs  Lab 04/22/18  0612 04/21/18  0731 04/20/18  1755   Sodium 143 143 142   Potassium 3.2* 3.6 4.3   Chloride 99* 101 98*   CO2 31* 30* 27   BUN 17.3 16.6 17.7   Creatinine 0.8 0.8 0.9   EGFR >60.0 >60.0 >60.0   Glucose 116* 230* 185*   Calcium 9.0 8.9 9.1     .  Lab Results   Component Value Date    BNP 206.4 (H) 04/22/2018      Imaging:   Radiological Procedure reviewed.      Signed by: Harli Engelken,MD, [facility]  04/22/2018, 8:57 AM  [facility]  [phone]    *This note  was generated by the Epic EMR system/ Dragon speech recognition and may contain inherent errors or omissions not intended by the user. Grammatical errors, random word insertions, deletions, pronoun errors and incomplete sentences are occasional consequences of this technology due to software limitations. Not all errors are caught or corrected. If there are questions or concerns about the content of this note or information contained within the body of this dictation they should be addressed directly with the author for clarification

## 2018-04-22 NOTE — Plan of Care (Signed)
Document patient's oxygen at rest on room air:   ___88__% on room air, at rest (No ranges please)   __91___% on oxygen, at rest at_2____LPM via NC   IF 88% OR BELOW on room air, STOP HERE,   IF NOT ambulate patient on room air with exertion and document below:   _____% on room air, with exertion (must be 88% or below)   _____% on oxygen, with exertion, at_____LPM via NC   All three tests must be during the same session.   Please document in a PROGRESS NOTE.   Testing to qualify for home oxygen must be no earlier than 48 hours prior to discharge, or it will need to be repeated.   Please call the home health care liaison at [phone] when completed.       The nurse, Huntley Dec [phone], is aware of completing the oxygen template to see if patient meets criteria for home oxygen.  .      CM also notified the [facility] at [phone] to follow up with the patient for home health and possible home oxygen.  CM will follow up as needed

## 2018-04-22 NOTE — Discharge Instr - AVS First Page (Signed)
Home Health Discharge Information     Your doctor has ordered Skilled Nursing, Physical Therapy, Occupational Therapy and Home Health Aide in-home service(s) for you while you recuperate at home, to assist you in the transition from hospital to home.    The agency that you or your representative chose to provide the service:  Name of Home Health Agency Placement: [facility]] [phone]    [employer]:  Name of DME Agency: [employer] [facility] [employer] / [employer] / [facility] Intake (Home Oxygen 2L via nasal cannula continuous)] [phone]  Upon discharge hospital RT to provide portable Oxygen tank.  Patient/family to call above Home Oxygen company on discharge from the hospital to set up exact delivery time of the oxygen supplies to home.     The above services were set up by:  Clenton Pare  [facility] Liaison)   Phone      [phone]    Additional comments:   If you have not heard from your home health agency within 24-48 hours after discharge please call your agency to arrange a time for your first visit.  For any scheduling concerns or questions related to home health, such as time or date please contact your home health agency at the number listed above.

## 2018-04-23 LAB — CBC
Absolute NRBC: 0 10*3/uL (ref 0.00–0.00)
Hematocrit: 30.4 % — ABNORMAL LOW (ref 34.7–43.7)
Hgb: 8.5 g/dL — ABNORMAL LOW (ref 11.4–14.8)
MCH: 21 pg — ABNORMAL LOW (ref 25.1–33.5)
MCHC: 28 g/dL — ABNORMAL LOW (ref 31.5–35.8)
MCV: 75.2 fL — ABNORMAL LOW (ref 78.0–96.0)
MPV: 9 fL (ref 8.9–12.5)
Nucleated RBC: 0 /100 WBC (ref 0.0–0.0)
Platelets: 352 10*3/uL — ABNORMAL HIGH (ref 142–346)
RBC: 4.04 10*6/uL (ref 3.90–5.10)
RDW: 19 % — ABNORMAL HIGH (ref 11–15)
WBC: 12.2 10*3/uL — ABNORMAL HIGH (ref 3.10–9.50)

## 2018-04-23 LAB — ECG 12-LEAD
Atrial Rate: 98 {beats}/min
P Axis: 59 degrees
P-R Interval: 124 ms
Q-T Interval: 342 ms
QRS Duration: 72 ms
QTC Calculation (Bezet): 436 ms
R Axis: 53 degrees
T Axis: 36 degrees
Ventricular Rate: 98 {beats}/min

## 2018-04-23 LAB — BASIC METABOLIC PANEL
Anion Gap: 11 (ref 5.0–15.0)
BUN: 13.7 mg/dL (ref 7.0–19.0)
CO2: 33 mEq/L — ABNORMAL HIGH (ref 22–29)
Calcium: 8.5 mg/dL (ref 7.9–10.2)
Chloride: 99 mEq/L — ABNORMAL LOW (ref 100–111)
Creatinine: 0.7 mg/dL (ref 0.6–1.0)
Glucose: 115 mg/dL — ABNORMAL HIGH (ref 70–100)
Potassium: 3.4 mEq/L — ABNORMAL LOW (ref 3.5–5.1)
Sodium: 143 mEq/L (ref 136–145)

## 2018-04-23 LAB — GFR: EGFR: >60

## 2018-04-23 LAB — GLUCOSE WHOLE BLOOD - POCT
Whole Blood Glucose POCT: 118 mg/dL — ABNORMAL HIGH (ref 70–100)
Whole Blood Glucose POCT: 190 mg/dL — ABNORMAL HIGH (ref 70–100)
Whole Blood Glucose POCT: 199 mg/dL — ABNORMAL HIGH (ref 70–100)
Whole Blood Glucose POCT: 204 mg/dL — ABNORMAL HIGH (ref 70–100)

## 2018-04-23 MED ORDER — POTASSIUM CHLORIDE CRYS ER 20 MEQ PO TBCR
40.00 meq | EXTENDED_RELEASE_TABLET | Freq: Once | ORAL | Status: AC
Start: 2018-04-23 — End: 2018-04-23
  Administered 2018-04-23: 10:00:00 40 meq via ORAL
  Filled 2018-04-23: qty 2

## 2018-04-23 MED ORDER — HYDROMORPHONE HCL 2 MG PO TABS
2.00 mg | ORAL_TABLET | Freq: Two times a day (BID) | ORAL | Status: DC
Start: 2018-04-23 — End: 2018-04-24
  Administered 2018-04-23 – 2018-04-24 (×2): 2 mg via ORAL
  Filled 2018-04-23 (×2): qty 1

## 2018-04-23 NOTE — Plan of Care (Signed)
Problem: Moderate/High Fall Risk Score >5  Goal: Patient will remain free of falls  Outcome: Progressing   04/23/18 2046   OTHER   High (Greater than 13) HIGH-Activate bed/chair exit alarm where available;HIGH-Apply yellow "Fall Risk" arm band;HIGH-Initiate use of floor mats as appropriate       Problem: Safety  Goal: Patient will be free from injury during hospitalization  Outcome: Progressing   04/23/18 1118   Goal/Interventions addressed this shift   Patient will be free from injury during hospitalization  Assess patient's risk for falls and implement fall prevention plan of care per policy;Provide and maintain safe environment;Use appropriate transfer methods;Ensure appropriate safety devices are available at the bedside;Include patient/ family/ care giver in decisions related to safety;Assess for patients risk for elopement and implement Elopement Risk Plan per policy;Hourly rounding;Provide alternative method of communication if needed (communication boards, writing)       Problem: Pain  Goal: Pain at adequate level as identified by patient  Outcome: Progressing   04/23/18 2312   Goal/Interventions addressed this shift   Pain at adequate level as identified by patient Assess for risk of opioid induced respiratory depression, including snoring/sleep apnea. Alert healthcare team of risk factors identified.;Identify patient comfort function goal;Assess pain on admission, during daily assessment and/or before any "as needed" intervention(s);Reassess pain within 30-60 minutes of any procedure/intervention, per Pain Assessment, Intervention, Reassessment (AIR) Cycle;Evaluate if patient comfort function goal is met;Evaluate patient's satisfaction with pain management progress;Offer non-pharmacological pain management interventions;Consult/collaborate with Physical Therapy, Occupational Therapy, and/or Speech Therapy

## 2018-04-23 NOTE — Plan of Care (Signed)
Problem: Moderate/High Fall Risk Score >5  Goal: Patient will remain free of falls  Outcome: Progressing   04/23/18 0740   OTHER   High (Greater than 13) MOD-Initiate Yellow "Fall Risk" magnet communication tool;MOD-Use of chair-pad alarm when appropriate;MOD-Remain with patient during toileting;MOD-Include family in multidisciplinary POC discussions;MOD-Place Fall Risk level on whiteboard in room;HIGH-Activate bed/chair exit alarm where available;HIGH-Apply yellow "Fall Risk" arm band   Bed in low locked, alarm is on. Call light within reach, instructed to call for assistance. Nonslip socks when OOB. Hourly rounding.      Problem: Safety  Goal: Patient will be free from injury during hospitalization  Outcome: Progressing   04/23/18 1118   Goal/Interventions addressed this shift   Patient will be free from injury during hospitalization  Assess patient's risk for falls and implement fall prevention plan of care per policy;Provide and maintain safe environment;Use appropriate transfer methods;Ensure appropriate safety devices are available at the bedside;Include patient/ family/ care giver in decisions related to safety;Assess for patients risk for elopement and implement Elopement Risk Plan per policy;Hourly rounding;Provide alternative method of communication if needed (communication boards, writing)   Bed in low locked, alarm is on. Call light within reach, instructed to call for assistance. Nonslip socks when OOB. Hourly rounding.    Goal: Patient will be free from infection during hospitalization  Outcome: Progressing   04/23/18 1118   Goal/Interventions addressed this shift   Free from Infection during hospitalization Assess and monitor for signs and symptoms of infection;Monitor lab/diagnostic results;Monitor all insertion sites (i.e. indwelling lines, tubes, urinary catheters, and drains)       Comments: AM assessments completed.

## 2018-04-23 NOTE — Plan of Care (Signed)
Problem: Safety  Goal: Patient will be free from injury during hospitalization  Outcome: Progressing   04/23/18 0981   Goal/Interventions addressed this shift   Patient will be free from injury during hospitalization  Assess patient's risk for falls and implement fall prevention plan of care per policy;Provide and maintain safe environment;Use appropriate transfer methods;Ensure appropriate safety devices are available at the bedside;Include patient/ family/ care giver in decisions related to safety;Hourly rounding;Assess for patients risk for elopement and implement Elopement Risk Plan per policy

## 2018-04-23 NOTE — Progress Notes (Signed)
Pt reported 5/10 chest tightness and SOB. VSS; breathing easy and unlabored. 12 lead EKG obtained and Dr. Leonie Man notified via telephone. Pt given IV Lasix x1 and troponin obtained. Troponin negative.     Shortly after, pt reported chest pain had subsided. Will continue to monitor.

## 2018-04-23 NOTE — Progress Notes (Signed)
[facility] H[facility]PITAL[facility]  Progress Note  Patient Info:   Date/Time: 04/23/2018 / 1:23 PM   Admit Date:04/20/2018  Patient Name:Rachael [name]   ZOX:[id number]   PCP: Kerin Perna, DO  Attending Physician:Bailee Metter, Marily Lente, MD     Assessment and Plan:     82 years old woman with history of COPD, GERD, HLD, HTN, hypothyroidism, chronic low back pain, DM insulin dependent presents with complaints of left shoulder pain and shortness of breath.     # Dyspnea - multifactorial due to COPD and mild pulmonary edema due to acute on chronic diastolic heart failure- significantly improved   Last echo in Epic in 01/2017 showing LV ejection fraction of 65% with grade 1 diastolic dysfunction  CXR showing mild central vascular congestion  Continue with Bumex 2 mg IV times a day, she continues to have significant negative fluid balance with preserved kidney function   Consulted  cardiology/Dr. Alanson Aly , discussed with him   Continue with oxygen supplementation as needed  Monitor I's and O's, fluid restriction   Daily weights  She is feeling better, but she continues to have significant b/l LE edema and bibasilar rales, while she has an excellent response to IV diuretics. She will greatly benefit for another day of IV diuretics.     # Hypokalemia -persisting   Will replace and check again tomorrow     # Left shoulder and left chest pain - this is reproducible by palpation   Patient had a recent fall with left shoulder dislocation and she has intermittent pain since     # Mild leukocytosis- likely reactive  Will monitor       #Diabetes-insulin-dependent  Hemoglobin A1c 7.4  Hold metformin  Continue home doses of Lantus and sliding scale  Diabetic  diet    #Hypertension-seems well controlled  Continue home meds    #Chronic lower extremity lymphedema  Bumex at home    # Chronic anemia - at baseline     #COPD-not in exacerbation  Continue home inhalers  Continue duo nebs as needed    #Chronic low back pain  Continue home pain  management    #Hypothyroidism    TSH- wnl   continue Synthroid    # Elevated D dimer   Chest CT negative for PE        DVT Prohylaxis:lovenox   Central Line/Foley Catheter/PICC line status: none  Code Status: Prior  Disposition:home  Type of Admission:Inpatient  Expected Date of Discharge: 1-2 days     Hospital Problems:   Active Problems:    Acute respiratory failure    SOB (shortness of breath)    Subjective:   04/23/18 feeling much better today     Chief Complaint:  Chest Pain and Shortness of Breath    Review of Systems   Constitutional: Negative for chills and fever.   Respiratory: Negative for shortness of breath and wheezing.      Objective:     Vitals:    04/23/18 0729 04/23/18 0835 04/23/18 0930 04/23/18 1321   BP:  119/74 134/77 100/61   Pulse: 90  100 99   Resp:   18 18   Temp:   97.8 F (36.6 C) 97.7 F (36.5 C)   TempSrc:   Oral Oral   SpO2: 95%  95% 94%   Weight:       Height:         Physical Exam:   Physical Exam   Constitutional: She is oriented  to person, place, and time and well-developed, well-nourished, and in no distress. No distress.   HENT:   Head: Normocephalic and atraumatic.   Eyes: Conjunctivae are normal.   Neck: Neck supple. No JVD present.   Cardiovascular: Normal rate, regular rhythm and intact distal pulses.    Pulmonary/Chest: Effort normal. No respiratory distress. She has no wheezes. She has rales.   Bibasilar rales    Abdominal: Soft. Bowel sounds are normal. She exhibits no distension. There is no tenderness. There is no rebound and no guarding.   Musculoskeletal: She exhibits edema. She exhibits no tenderness.   B/l LE pitting edema, improved from yesterday    Neurological: She is alert and oriented to person, place, and time.   Skin: Skin is warm and dry. She is not diaphoretic.   Multiple ecchymosis    Psychiatric: Mood and affect normal.   Vitals reviewed.    Results of Labs/imaging   Labs and radiology reports have been reviewed.    Hospitalist   Signed by:   Marily Lente Etoy Mcdonnell  04/23/2018 1:23 PM    *This note was generated by the Epic EMR system/ Dragon speech recognition and may contain inherent errors or omissions not intended by the user. Grammatical errors, random word insertions, deletions, pronoun errors and incomplete sentences are occasional consequences of this technology due to software limitations. Not all errors are caught or corrected. If there are questions or concerns about the content of this note or information contained within the body of this dictation they should be addressed directly with the author for clarification

## 2018-04-24 LAB — BASIC METABOLIC PANEL
Anion Gap: 12 (ref 5.0–15.0)
BUN: 12.1 mg/dL (ref 7.0–19.0)
CO2: 31 mEq/L — ABNORMAL HIGH (ref 22–29)
Calcium: 9 mg/dL (ref 7.9–10.2)
Chloride: 99 mEq/L — ABNORMAL LOW (ref 100–111)
Creatinine: 0.7 mg/dL (ref 0.6–1.0)
Glucose: 134 mg/dL — ABNORMAL HIGH (ref 70–100)
Potassium: 3.7 mEq/L (ref 3.5–5.1)
Sodium: 142 mEq/L (ref 136–145)

## 2018-04-24 LAB — GLUCOSE WHOLE BLOOD - POCT: Whole Blood Glucose POCT: 110 mg/dL — ABNORMAL HIGH (ref 70–100)

## 2018-04-24 LAB — CBC
Absolute NRBC: 0 10*3/uL (ref 0.00–0.00)
Hematocrit: 31.2 % — ABNORMAL LOW (ref 34.7–43.7)
Hgb: 8.6 g/dL — ABNORMAL LOW (ref 11.4–14.8)
MCH: 21 pg — ABNORMAL LOW (ref 25.1–33.5)
MCHC: 27.6 g/dL — ABNORMAL LOW (ref 31.5–35.8)
MCV: 76.1 fL — ABNORMAL LOW (ref 78.0–96.0)
MPV: 9.2 fL (ref 8.9–12.5)
Nucleated RBC: 0 /100 WBC (ref 0.0–0.0)
Platelets: 335 10*3/uL (ref 142–346)
RBC: 4.1 10*6/uL (ref 3.90–5.10)
RDW: 19 % — ABNORMAL HIGH (ref 11–15)
WBC: 11.33 10*3/uL — ABNORMAL HIGH (ref 3.10–9.50)

## 2018-04-24 LAB — CELL MORPHOLOGY
Cell Morphology: ABNORMAL — AB
Platelet Estimate: NORMAL

## 2018-04-24 LAB — B-TYPE NATRIURETIC PEPTIDE: B-Natriuretic Peptide: 124.6 pg/mL — ABNORMAL HIGH (ref 0.0–100.0)

## 2018-04-24 LAB — MAGNESIUM: Magnesium: 2.1 mg/dL (ref 1.6–2.6)

## 2018-04-24 LAB — GFR: EGFR: >60

## 2018-04-24 MED ORDER — BUMETANIDE 1 MG PO TABS
2.00 mg | ORAL_TABLET | Freq: Two times a day (BID) | ORAL | 0 refills | Status: DC
Start: 2018-04-24 — End: 2018-04-24

## 2018-04-24 MED ORDER — BUMETANIDE 1 MG PO TABS
2.00 mg | ORAL_TABLET | Freq: Two times a day (BID) | ORAL | 0 refills | Status: AC
Start: 2018-04-24 — End: ?

## 2018-04-24 NOTE — Discharge Summary -  Nursing (Signed)
Patient discharged to home in stable condition accompanied by daughter.Discharge instructions given, patient and daughter verbalized understanding. PIV and tele monitor removed prior to discharge.Patient wheeled off the floor by nursing staff.

## 2018-04-24 NOTE — Discharge Summary (Signed)
[facility]   [place] Summary   Patient Info:   Date/Time: 04/24/2018 / 2:43 PM   Admit Date:04/20/2018  Patient Name:Rachael [name]   VQQ:[id number]   PCP: Kerin Perna, DO  Attending Physician:Keshav Winegar, Marily Lente, MD     Hospital Course:   Please see H&P for complete details of HPI and ROS. The patient was admitted to [facility] and has been taken care as mentioned below.    82 years old woman with history of COPD, GERD, HLD, HTN, hypothyroidism, chronic low back pain, DM insulin dependent presents with complaints of left shoulder pain and shortness of breath.     # Dyspnea - multifactorial due to COPD and mild pulmonary edema due to acute on chronic diastolic heart failure- significantly improved   Last echo in Epic in 01/2017 showing LV ejection fraction of 65% with grade 1 diastolic dysfunction  She had excellent diuresis with Bumex 2 mg IV bid, will discharged on Bumex 2 mg PO bid. I verified with there [facility], this was the dose she was taking at home.   Consulted  cardiology/Dr. Alanson Aly , discussed with him     # Hypokalemia -resolved    # Left shoulder and left chest pain - this is reproducible by palpation   Patient had a recent fall with left shoulder dislocation and she has intermittent pain since     # Mild leukocytosis- likely reactive      #Diabetes-insulin-dependent  Hemoglobin A1c 7.4  Resume home medication    #Hypertension-seems well controlled  Continue home meds    #Chronic lower extremity lymphedema  Bumex at home    # Chronic anemia - at baseline     #COPD-not in exacerbation  Continue home inhalers      #Chronic low back pain  Continue home pain management    #Hypothyroidism    TSH- wnl   continue Synthroid    # Elevated D dimer   Chest CT negative for PE      Disposition:home  Condition at Discharge and Prognosis: stable   Admission Date:04/20/2018  Discharge Date: 04/24/18  Type of Admission:Inpatient   Code Status: Full Code  Subjective at the time of  discharge:   Patient is hemodynamically stable to be discharged.   Chief Complaint:  Chest Pain and Shortness of Breath    Objective:     Vitals:    04/24/18 0704 04/24/18 0803 04/24/18 0931 04/24/18 1307   BP:  124/70 124/70 117/73   Pulse:  (!) 101  (!) 102   Resp:  18  20   Temp:  98.2 F (36.8 C)  97.1 F (36.2 C)   TempSrc:  Oral  Temporal Artery   SpO2: 93% 97%  95%   Weight:       Height:         Physical Exam:   Physical Exam   Constitutional: She is oriented to person, place, and time. She appears well-developed and well-nourished. No distress.   HENT:   Head: Normocephalic and atraumatic.   Eyes: Conjunctivae are normal.   Neck: Neck supple. No JVD present.   Cardiovascular: Normal rate, regular rhythm and intact distal pulses.    Pulmonary/Chest: Effort normal and breath sounds normal. No respiratory distress. She has no wheezes.   Minimal rales at the bases    Abdominal: Soft. Bowel sounds are normal. She exhibits no distension. There is no tenderness. There is no rebound.   Musculoskeletal: She exhibits edema.  She exhibits no tenderness.   B/l LE edema significantly improved    Neurological: She is alert and oriented to person, place, and time.   Skin: Skin is warm and dry. She is not diaphoretic. No erythema.   Psychiatric: She has a normal mood and affect. Her behavior is normal.   Vitals reviewed.      Clinical Presentation:   History of Presenting Illness: Please refer to HPI in the Detailed H&P  Discharge Medications:   Discharge Medications:      Discharge Medication List      Taking    ALPRAZolam 0.25 MG tablet  Dose:  0.25 mg  Commonly known as:  XANAX  Take 0.25 mg by mouth 3 (three) times daily as needed     bumetanide 1 MG tablet  Dose:  2 mg  Commonly known as:  BUMEX  Take 2 tablets (2 mg total) by mouth 2 (two) times daily     gabapentin 300 MG capsule  Dose:  900 mg  Commonly known as:  NEURONTIN  Take 900 mg by mouth 2 (two) times daily.     guaiFENesin 600 MG 12 hr tablet  Dose:  600  mg  What changed:  when to take this  Commonly known as:  MUCINEX  Take 1 tablet (600 mg total) by mouth every 12 (twelve) hours.     hydrALAZINE 25 MG tablet  Dose:  1 tablet  Commonly known as:  APRESOLINE  Take 1 tablet by mouth 2 (two) times daily.     HYDROcodone-acetaminophen 5-325 MG per tablet  Dose:  1 tablet  What changed:  when to take this  Commonly known as:  NORCO  Take 1 tablet by mouth every 4 (four) hours as needed for Pain.     * insulin glargine 100 UNIT/ML injection  Dose:  18 Units  Commonly known as:  LANTUS  Inject 18 Units into the skin nightly.     * insulin glargine 100 UNIT/ML injection  Dose:  28 Units  Commonly known as:  LANTUS  Inject 28 Units into the skin every morning     insulin lispro 100 UNIT/ML injection  Dose:  1-8 Units  What changed:   how much to take   when to take this  Commonly known as:  HumaLOG  Inject 1-8 Units into the skin 3 (three) times daily before meals as needed (High blood glucose. See administration instructions.).     lactobacillus acidophilus & bulgar chewable tablet  Dose:  1 tablet  Chew 1 tablet by mouth 2 (two) times daily.     levothyroxine 50 MCG tablet  Dose:  50 mcg  Commonly known as:  SYNTHROID, LEVOTHROID  Take 50 mcg by mouth Once a day at 6:00am.     loperamide 2 MG capsule  Dose:  2 mg  Commonly known as:  IMODIUM  Take 2 mg by mouth as needed for Diarrhea.     naloxone 4 MG/0.1ML nasal spray  Dose:  1 spray  Commonly known as:  NARCAN  1 spray by Nasal route once as needed.For signs of opioid overdose.     raNITIdine 150 MG tablet  Dose:  150 mg  Commonly known as:  ZANTAC  Take 150 mg by mouth 2 (two) times daily.     SYMBICORT 160-4.5 MCG/ACT inhaler  Dose:  2 puff  Generic drug:  budesonide-formoterol  Inhale 2 puffs into the lungs 2 (two) times daily.     SYSTANE  ULTRA OP  Dose:  1 drop  Apply 1 drop to eye 4 (four) times daily.     traZODone 100 MG tablet  Dose:  50 mg  Commonly known as:  DESYREL  Take 50 mg by mouth nightly.      ZOHYDRO ER 10 MG C12a  Dose:  10 mg  Generic drug:  HYDROcodone Bitartrate  Take 10 mg by mouth every 12 (twelve) hours        * This list has 2 medication(s) that are the same as other medications prescribed for you. Read the directions carefully, and ask your doctor or other care provider to review them with you.              Follow up recommendations:   Follow up:   Follow-up Information     [facility] Follow up in 2 day(s).    Contact information:  [address]  [place] [address]  [phone]           Behiri, Amr H, DO Follow up.    Specialty:  Internal Medicine  Contact information:  [address] [place] [address]             Elbert Ewings, MD Follow up.    Specialties:  Interventional Cardiology, Critical Care Medicine, Cardiology  Contact information:  [address]  [place] [address]  [phone]                  Results of Labs/imaging:   Labs have been reviewed:   Coagulation Profile:       CBC review:   Recent Labs  Lab 04/24/18  0507 04/23/18  0611 04/22/18  0612 04/21/18  0731 04/20/18  1755   WBC 11.33* 12.20* 10.10* 9.32 13.77*   Hgb 8.6* 8.5* 8.8* 8.6* 9.1*   Hematocrit 31.2* 30.4* 31.7* 30.9* 32.2*   Platelets 335 352* 382* 410* 486*   MCV 76.1* 75.2* 76.0* 75.2* 75.6*   RDW 19* 19* 20* 20* 20*   Neutrophils  --   --   --   --  86.9   Lymphocytes Automated  --   --   --   --  6.8   Eosinophils Automated  --   --   --   --  0.0   Immature Granulocyte  --   --   --   --  1.3   Neutrophils Absolute  --   --   --   --  11.96*   Absolute Immature Granulocyte  --   --   --   --  0.18*     Chem Review:  Recent Labs  Lab 04/24/18  0507 04/23/18  0611 04/22/18  0612 04/21/18  0731 04/20/18  1755   Sodium 142 143 143 143 142   Potassium 3.7 3.4* 3.2* 3.6 4.3   Chloride 99* 99* 99* 101 98*   CO2 31* 33* 31* 30* 27   BUN 12.1 13.7 17.3 16.6 17.7   Creatinine 0.7 0.7 0.8 0.8 0.9   Glucose 134* 115* 116* 230* 185*   Calcium 9.0 8.5 9.0 8.9 9.1   Magnesium 2.1  --    --   --   --    Bilirubin, Total  --   --   --   --  0.3   AST (SGOT)  --   --   --   --  20   ALT  --   --   --   --  12   Alkaline Phosphatase  --   --   --   --  80     Results     Procedure Component Value Units Date/Time    Magnesium [[id number]] Collected:  04/24/18 0507    Specimen:  Blood Updated:  04/24/18 0915     Magnesium 2.1 mg/dL     B-type Natriuretic Peptide [[id number]]  (Abnormal) Collected:  04/24/18 0507    Specimen:  Blood Updated:  04/24/18 0900     B-Natriuretic Peptide 124.6 (H) pg/mL     Glucose Whole Blood - POCT [[id number]]  (Abnormal) Collected:  04/24/18 0807     Updated:  04/24/18 0810     POCT - Glucose Whole blood 110 (H) mg/dL     Cell MorpHology [[id number]]  (Abnormal) Collected:  04/24/18 0507     Updated:  04/24/18 0617     Cell Morphology: Abnormal (A)     Platelet Estimate Normal     Polychromasia Present (A)     Ovalocytes Present (A)    Basic Metabolic Panel [[id number]]  (Abnormal) Collected:  04/24/18 0507    Specimen:  Blood Updated:  04/24/18 0550     Glucose 134 (H) mg/dL      BUN 36.6 mg/dL      Creatinine 0.7 mg/dL      Calcium 9.0 mg/dL      Sodium 440 mEq/L      Potassium 3.7 mEq/L      Chloride 99 (L) mEq/L      CO2 31 (H) mEq/L      Anion Gap 12.0    GFR [[id number]] Collected:  04/24/18 0507     Updated:  04/24/18 0550     EGFR >60.0    CBC without differential [[id number]]  (Abnormal) Collected:  04/24/18 0507    Specimen:  Blood from Blood Updated:  04/24/18 0548     WBC 11.33 (H) x10 3/uL      Hgb 8.6 (L) g/dL      Hematocrit 95.1 (L) %      Platelets 335 x10 3/uL      RBC 4.10 x10 6/uL      MCV 76.1 (L) fL      MCH 21.0 (L) pg      MCHC 27.6 (L) g/dL      RDW 19 (H) %      MPV 9.2 fL      Nucleated RBC 0.0 /100 WBC      Absolute NRBC 0.00 x10 3/uL     Glucose Whole Blood - POCT [[id number]]  (Abnormal) Collected:  04/23/18 2149     Updated:  04/23/18 2203     POCT - Glucose Whole blood 204 (H) mg/dL     Glucose Whole Blood - POCT [[id number]]  (Abnormal) Collected:   04/23/18 1619     Updated:  04/23/18 1623     POCT - Glucose Whole blood 199 (H) mg/dL         Radiology reports have been reviewed:  Radiology Results (24 Hour)     ** No results found for the last 24 hours. **        Chest 2 Views    Result Date: 04/20/2018  History: Chest pain. Frontal and lateral views of the chest were obtained and compared to 03/01/2018. Mild central vascular congestion. Minimal basal atelectasis. No focal consolidation or pleural effusions. Mild cardiomegaly.     Mild cardiomegaly and mild central vascular congestion. Minimal basilar atelectasis. Wei-Shen  Ronal Fear, MD 04/20/2018 6:48 PM    Ct Angiogram Chest    Result Date: 04/21/2018  HISTORY: Dyspnea. Hypoxia. Elevated d-dimer. PROCEDURE: CT angiogram of the chest was performed using ACR protocol. MIP images are included. The patient was infused with 100 cc of Visipaque 320. The following dose reduction techniques were utilized: automated exposure control and/or adjustment of the mA and/or kV according to patient's size, and the use of iterative reconstruction technique.    Marland Kitchen FINDINGS: Heart size is enlarged. Is no pericardial effusion. Aorta is normal in caliber without focal aneurysmal dilatation or dissection. Atherosclerotic calcification plaque is present in the aorta. There are no enlarged mediastinal or hilar lymph nodes. Dependent changes present in the lung bases. There is atelectasis in the upper lobes posterior medially. There is groundglass opacity. No acute abnormalities identified on limited images of the upper abdomen included on this study. Area decreased attenuation in the caudate lobe may represent a cyst. This appears stable when compared to 03/19/2017. Degenerative changes spine is present.     1. No pulmonary emboli identified. 2. Scattered groundglass opacity which is nonspecific. There is posterior atelectasis most prominent in the upper lobes. Olen Pel, MD 04/21/2018 4:37 AM    Pathology:   Specimens     None         Pending Lab Results:   Labs/Images to be followed at your PCP office: Unresulted Labs     None        Hospitalist:   Signed by: Marily Lente Marcena Dias  04/24/2018 2:43 PM  Time spent for discharge: 45 minutes      *This note was generated by the Epic EMR system/ Dragon speech recognition and may contain inherent errors or omissions not intended by the user. Grammatical errors, random word insertions, deletions, pronoun errors and incomplete sentences are occasional consequences of this technology due to software limitations. Not all errors are caught or corrected. If there are questions or concerns about the content of this note or information contained within the body of this dictation they should be addressed directly with the author for clarification

## 2018-04-24 NOTE — Progress Notes (Signed)
AVS - Discharge Instructions      Rachael [name] [id number]   04/20/2018 - 04/24/2018  [facility]  [facility]  [phone]   AFTER VISIT SUMMARY (AVS)   AVS - Discharge Instructions   Most Important        About Your Stay     Home Health Discharge Information     Your doctor has ordered Skilled Nursing, Physical Therapy, Occupational Therapy and Home Health Aide in-home service(s) for you while you recuperate at home, to assist you in the transition from hospital to home.    The agency that you or your representative chose to provide the service:  Name of Home Health Agency Placement: [facility]] [phone]    [employer]:  Name of DME Agency: [employer] [facility] [employer] / [facility] / [facility] Intake (Home Oxygen 2L via nasal cannula continuous)] [phone]  Upon discharge hospital RT to provide portable Oxygen tank.  Patient/family to call above Home Oxygen company on discharge from the hospital to set up exact delivery time of the oxygen supplies to home.     The above services were set up by:  Clenton Pare  [facility] Liaison)   Phone      [phone]    Additional comments:   If you have not heard from your home health agency within 24-48 hours after discharge please call your agency to arrange a time for your first visit.  For any scheduling concerns or questions related to home health, such as time or date please contact your home health agency at the number listed above.        Things To Do     Do        [id number] [place], [place] - [address] AT [address]OSS FROM [facility]   1 bumetanide   Doctor in charge of your hospital stay     Tipler, Marily Lente, MD Phone: [phone]  What's next     What's next    Follow up with Amr Daisey Must, DO  [facility]   [address]   [place] [address]     Follow up with Elbert Ewings, MD  [facility]   [address]   [place] [address]   [phone]     Follow up with [facility] in 2 day(s)  [address]   [place] [address]   [phone]    You are allergic to the following     You are allergic to the following   Allergen Reactions   Tolectin (Tolmetin) Swelling       Lyrica (Pregabalin) Not Noted   tremors       Nsaids Not Noted       Latex Itching   Immunizations Administered for This Admission     No immunizations on file.   Discharge Medication List   Medication Lists help reduce medication errors and help prevent harmful drug interactions. Please maintain and update your medication list and share it with your health care providers at every visit.      Discharge Medication List    Instructions AM Noon PM Bed As Needed   ALPRAZolam 0.25 MG tablet   Common Name: XANAX     Take 0.25 mg by mouth 3 (three) times daily as needed  bumetanide 1 MG tablet   Common Name: BUMEX     Take 2 tablets (2 mg total) by mouth 2 (two) times daily   Last given: Ask your nurse or doctor           gabapentin 300 MG capsule   Common Name: NEURONTIN     Take 900 mg by mouth 2 (two) times daily.   Last given: 04/24/2018 9:31 AM         6/9 tonight     guaiFENesin 600 MG 12 hr tablet   Common Name: MUCINEX   What changed: when to take this     Take 1 tablet (600 mg total) by mouth every 12 (twelve) hours.         6/9 tonight     hydrALAZINE 25 MG tablet   Common Name: APRESOLINE     Take 1 tablet by mouth 2 (two) times daily.   Last given: 04/24/2018 9:32 AM         HYDROcodone-acetaminophen 5-325 MG per tablet   Common Name: [facility]   What changed: when to take this     Take 1 tablet by mouth every 4 (four) hours as needed for Pain.   Last given: 04/24/2018 9:40 AM          * insulin glargine 100 UNIT/ML injection   Common Name: LANTUS     Inject 18 Units into the skin nightly.   Last given: Ask your nurse or doctor         * insulin glargine 100 UNIT/ML injection    Common Name: LANTUS     Inject 28 Units into the skin every morning   Last given: Ask your nurse or doctor          insulin lispro 100 UNIT/ML injection   Common Name: HumaLOG   What changed:   0 how much to take   0 when to take this     Inject 1-8 Units into the skin 3 (three) times daily before meals as needed (High blood glucose. See administration instructions.).   Last given: 04/23/2018 10:11 PM         6/9 tonight with dinner      lactobacillus acidophilus & bulgar chewable tablet     Chew 1 tablet by mouth 2 (two) times daily.         6/9 tonight     levothyroxine 50 MCG tablet   Common Name: Andree Moro     Take 50 mcg by mouth Once a day at 6:00am.   Last given: 04/24/2018 5:12 AM          loperamide 2 MG capsule   Common Name: IMODIUM     Take 2 mg by mouth as needed for Diarrhea.          naloxone 4 MG/0.1ML nasal spray   Common Name: NARCAN     1 spray by Nasal route once as needed.For signs of opioid overdose.          raNITIdine 150 MG tablet   Common Name: ZANTAC     Take 150 mg by mouth 2 (two) times daily.         6/9 tonight     SYMBICORT 160-4.5 MCG/ACT inhaler   Generic drug: budesonide-formoterol     Inhale 2 puffs into the lungs 2 (two) times daily.           SYSTANE ULTRA OP     Apply 1 drop to  eye 4 (four) times daily.             traZODone 100 MG tablet   Common Name: DESYREL     Take 50 mg by mouth nightly.   Last given: 04/23/2018 10:07 PM        6/9 tonight     ZOHYDRO ER 10 MG C12a   Generic drug: HYDROcodone Bitartrate     Take 10 mg by mouth every 12 (twelve) hours         6/9 tonight     * This list has 2 medication(s) that are the same as other medications prescribed for you. Read the directions carefully, and ask your doctor or other care provider to review them with you.   Where to pick up your medications        [id number] [place], [place] - [address] AT [address]OSS FROM [facility]      bumetanide       Address: [address], [place] [address]   Phone: [phone]   MyChart Sign Up         MyChart - Take control of your medical records  MyChart allows you immediate, secure and confidential access to your medical records.  Patients like you who use electronic medical record receive care that is:   Safer - More accurate prescriptions and less complications   Faster - Renew prescriptions, Make appointments   Prevention Focused - Helps physicians to stay on top of preventive   services for you, so that you stay healthy            Sign in using three easy steps: We've started for you!      1. Log on to [email or web address] or [email or web address]  2. Click on Activate Your Account to access the new member sign up page  3. Enter your unique MyChart Access Code exactly as it appears below to complete the sign-up process.  4. If you experience issues with activating your account, the information you have provided does not completely match our records. Please contact your provider's office or any Hospital Registration Department to correct the information required.    MyChart Access Code: [email or web address]  Expires: 06/03/2018  6:22 PM  (If your code has expired, please contact us to receive a new one)     MyChart is NOT to be used for urgent needs. For medical emergencies, dial 911    Questions  If you have questions please refer to the FAQs located on the MyChart home page. You can also call [phone] to talk to our MyChart staff.    Instructions     Ask3Teach3 Program    Education about New Medications and their Side effects    Dear Rachael [name],    Its been a pleasure taking care of you during your hospitalization here at [facility]. We have initiated a new program to educate our patients and/or their family members or designated personnel about the new medications started by your physicians and their indications along with the possible side effects. Multiple studies have shown that patients  started on new medications are often unaware of the names of the medication along with the indications and their side effects which leads to decreased compliance with the medications.    During our conversation today on 04/24/2018  12:17 PM I have explained to you the name of the new medication and the indication along with some possible common  side effects. Listed below are some of the new medications started during this hospitalization.     Please call the Nurse if you have any side effects while in hospital.     Please call 911 if you have any life threatening symptoms after you are discharged from the hospital.    Please inform your Primary care physician for common side effects which are not life threatening after discharge.    Medication Name: Bumetanide(Bumex)   This Medication is used for:   Congestive Heart Failure   Excess Fluid  Common Side Effects are:   Dizziness/Low Blood Pressure(especially when standing up)    Frequent Urination   Low Potassium Levels   Muscle Cramps   Dehydration  A note from your nurse:  Call your nurse immediately if you notice itching, hives, swelling or trouble breathing             Thank you for your time.    Your Nurse    04/24/2018  12:17 PM  [facility]  [address]  [place], [place]  [address]           DISCHARGE INSTRUCTIONS  MANAGING YOUR PAIN WITH OPIOIDS    WHAT ARE OPIOIDS?       Opioids (oh-pee-oids) are among the strongest and most effective pain relievers that your health care provider can prescribe.  Opioid is a medical term that describes what are also known as "narcotics".  Opioids are given as pills, but they also come in other forms (patches that stick to your skin, lozenges that go in your mouth, tabs that go under your tongue or on your gums, a "lollipop" like form or suppositories). Examples of opioid medications include morphine, HYDROmorphone or Dilaudid, Percocet, and Vicodin.     For your safety - in order to avoid  confusion and accidental duplication of opioids:   You should know the specific medication(s) you are taking for pain, both the generic name and the trade name.  An example is hydrocodone/acetaminophen or hydrocodone/APAP (generic names), also known as Vicodin or Lorcet (trade names).    You should know the dose and how often you can take the medication   You should know if the medication contains acetaminophen or Tylenol   Tylenol = Acetaminophen = APAP     Acetaminophen Important Facts                                                                                                                             Tylenol (Acetaminophen or APAP)        is in over 600 medicines           Acetaminophen or Tylenol is a common pain reliever    Check all your medication labels to see if it contains acetaminophen   Max dose of Acetaminophen (Tylenol) 4000mg /24hours for most people. Overdosing on Tylenol may lead to liver failure; ask your physician what the right dose is for  you      SIX OPIOID SAFETY (SOS) STEPS      Take medication only as directed    ? Never change your medication schedule or dose without first contacting your doctor   ? Do not use this medication for anything other than pain (for example, do not take pain medication to help you sleep unless pain is the reason you cannot sleep)   Take medication to keep pain under control.    ? Call your doctor or nurse if pain increases or if you do not get pain relief    Do not drive or operate heavy machinery as the medication can cause drowsiness. Discuss with your doctor when you can safely return to work or your normal activities   Do not drink alcohol while taking these medications   Discuss with your doctor all other medicines you are taking (including prescription and non-prescription medicines, vitamins and health remedies), especially those that also may make you sleepy or drowsy.  ? Ask your physician about medications to avoid when taking  opioids   Discuss with your doctor when and how to stop an opioid medication - stopping opioids suddenly can sometimes cause you to feel sick    KEEP YOUR OPIOID MEDICATIONS SAFE   Keep opioids safe:   ? LOCK-UP your medication in a secure place (such as a locked cabinet, drawer or safe) to prevent anyone else from finding it and using it.  Do not leave opioids out in the open or store in well-traveled areas such as the kitchen or bathroom medicine cabinet  ? Get rid of any leftover opioids so that they don't harm others once    ? Never give your opioid medications to anyone else.  Your medication is meant only for you.      Proper disposal of pain medications:  (If a drug take-back or collection program is not available)   Take your prescription drugs out of their original containers.    Mix drugs with an undesirable substance, such as cat litter or used coffee grounds.    Put the mixture into a disposable container with a lid, such as an empty margarine tub, or into a sealable bag.    Conceal or remove any personal information, including Rx number, on the empty containers by covering it with black permanent marker or duct tape, or by scratching it off.    Place the sealed container with the mixture, and the empty drug containers, in the trash.   Office of [employer]    Potential Problems with Opioids  When health care providers prescribe opioids for pain, they never want anyone to become "addicted" to these medicines - but it is possible.    It is important to understand some definitions to understand these potential problems:    Tolerance:  This can happen naturally when your body gets used to a particular medication.  As a result, the medication does not work as well as it used to and you might feel that you need to take more of it to get the same relief.  You should never make your own decision about this however, but always discuss the situation with your health care provider.   Sometimes the solution is simply to change medications or add a new medication.  NOTE:  Tolerance is very different from addiction, which is discussed below.    Physical dependence:  Another naturally-occurring state that happens when your mind and body get used to  a medication, but in a different way from tolerance.  When you become physically dependent on a medication, your body actually needs this medication in order to continue working properly.  This means that you should never suddenly stop taking any medication without first talking to your health care provider.  NOTE:  Physical dependence is very different from addiction, which is discussed below.    Withdrawal Symptoms:    This can occur with physical dependency when an opioid is stopped suddenly after your body had gotten used to the medication.  The withdrawal symptoms can begin several hours after taking the last dose of the opioid up to 24 hours after taking the last dose of the medicine (depending on the type of medication).  Withdrawal symptoms include feeling anxious, irritable, running nose, unable to sleep, chills and hot flashes, joint and muscle pain, sweating, feeling sick to your stomach, vomiting, diarrhea and abdominal cramps.  Withdrawal symptoms have been likened to having the "flu".  You should contact your health care provider immediately if you are having these symptoms.  Decreasing an opioid medicine gradually can prevent these symptoms from happening.    Misuse:  This refers to taking a medication in a way that it was not prescribed or for a condition other than the one for which it was prescribed.  An example of this is taking medicine prescribed for back pain to help you sleep.  This is something you should never do because it is unsafe.    Abuse:  This refers to taking a medication to get a result that has nothing to do with the pain it was intended to treat.  You are abusing your pain medicine, for example, if you take it to  feel better or get "high".  Like misuse, abuse is very dangerous.  You are not trained to understand the ways in which medications interact with anything else you are taking or with your physical condition. Abusing your pain medication is just as dangerous as driving recklessly or ignoring safety warnings while traveling.  Respect the rules laid out by your healthcare provider, and protect the safety and health of your body.    Addiction:  A person is becoming addicted to an opioid when the person is abusing the drug uncontrollably, to the point where it is causing physical harm.  Unlike tolerance and physical dependence, addiction is an unnatural state.  If you feel you are becoming addicted to your medication, meaning that you feel the need to take more and more of it and cannot control yourself, seek immediate medical help.    Signs of addiction Include:   Having a very strong urge to use the drug for non-medical reasons   Using the drug a lot, even if it causes bad side effects   Losing the ability to control how much is taken, or how often it's used    Before someone gets a prescription for an opioid pain medication, a healthcare provider may ask:   if that person has ever used addictive items like tobacco, alcohol or street drugs   if friends or family members have ever worried that the person might have a problem with drugs or alcohol   if anyone in the family has a history of drug or alcohol addiction   if the person has ever been arrested or has a history of legal problems involving drug use    These questions can help the healthcare provider in helping to prevent misuse, abuse or  addiction.  Self-Management Plan for   Chronic Obstructive Pulmonary Disease (COPD)      Green Zone: ALL CLEAR     If:   You are able to maintain your baseline normal activity level.    You have your usual amount of cough, phlegm and mucus   You are at your baseline breathing   Then:   Keep up the good work; your  symptoms are under control.   Continue taking your medications including your maintenance inhaler(s) (ones you take every day even if you feel well)    Do not stop any medication without consulting your physician   Keep your doctor appointments     Yellow Zone: CAUTION  Work closely with our health care team if you are in the Yellow Zone   If:   Your sputum (phlegm) increases in the amount, color changes or becomes thicker than usual   You have increased cough or increased wheezing even after you have taken your medicine and it has had  time to work   You are using your rescue inhaler or nebulizer more often   You have increased swelling of ankles and/or feet and fluctuating weight    You have increased shortness of breath with activity   You have a fever of 100.5 oral or 99.5 under your arm Then:   Your symptoms may indicate that you need an adjustment in you medications   Call your doctor   Call your home health nurse if one is currently involved in your care    Name:    Number:   Red Zone: DANGER     If:   You have unrelieved shortness of breath despite using your inhalers   You have persistent wheezing or chest tightness at rest   You have an increased heart rate or newly irregular heart beat   You notice a change in the color of your skin, nail beds or lips to gray or blue   You are experiencing confusion   You have chest pain or pain that worsens when you breathe or cough Then:   You need to be evaluated by a physician right away   Contact your doctor    If you cannot immediately get in touch with your doctor or they are not available, call 911    Name:    Number:                      Notice of Non-Discrimination     As a recipient of federal financial assistance, [facility]") does not exclude, deny benefits to, or otherwise discriminate against any person on the basis of race, color, national origin, sex, disability, or age in admission to, participation  in, or receipt of the services or benefits under any of its programs or activities, whether carried out by [place] directly or through a contractor or any other entity with which [facility] arranges to carry out its programs and activities.     This policy is in accordance with the provisions of Title VI of the Civil Rights Act of 1964, Section 504 of the Rehabilitation Act of 1973, the Age Discrimination Act of 1975, Section 1557 of the Affordable Care Act, and regulations of the U.S. Department of Health and [employer] issued pursuant to these statutes at 45 C.F.R. Parts 80, 84, 91 and 92, respectively.     [place]:     Provides free aids and services to people with disabilities to  communicate effectively with [place], such as:   o Qualified sign language interpreters   o Written information in other formats (large print, audio, accessible electronic formats, other formats)    Provides free language services to people whose primary language is not [place], such as:   o Gaffer   o Information written in other languages     If you need these services, please let our staff know of your needs for effective communication.     If you believe that [facility] has failed to provide these services or discriminated in another way on the basis of race, color, national origin, age, disability, or sex, you can file a Theatre manager by calling [phone]. You can file a grievance in person or by mail, fax, or email. If you need help filing a grievance, the Patient Relations staff is available to help you.     You can also file a civil rights complaint with the U.S. Department of Health and Health and safety inspector, Office for [employer], electronically through the Office for Civil Rights Complaint Portal, available at https://mendez-ellison.com/.jsf, or by mail or phone at:     U.S. Department of Health and Human Services   [address], [place]   Room [phone], [facility]   [place], [place]. [address]  [phone], [phone] (TDD)     Complaint forms are available at [email or web address]          Our patients are the reason for all we do: we want to improve and you can help! You may receive a survey asking about your visit - this will come from [place] through postal mail or via email from our Product manager. Please take the time to complete it; your valuable input will be used to recognize exceptional members of the care team and improve the quality of our service. Thank you!  Patient Signature: ____________________________________________________________   Date and Time: ____________________________________________________________   Responsible Adult: ____________________________________________________________   Date and Time: ____________________________________________________________   Nurse Signature: ____________________________________________________________   Date: ____________________________________________________________

## 2018-04-24 NOTE — Discharge Instructions (Signed)
Ask3Teach3 Program    Education about New Medications and their Side effects    Dear Rachael [name],    Its been a pleasure taking care of you during your hospitalization here at [facility]. We have initiated a new program to educate our patients and/or their family members or designated personnel about the new medications started by your physicians and their indications along with the possible side effects. Multiple studies have shown that patients started on new medications are often unaware of the names of the medication along with the indications and their side effects which leads to decreased compliance with the medications.    During our conversation today on 04/24/2018  12:17 PM I have explained to you the name of the new medication and the indication along with some possible common side effects. Listed below are some of the new medications started during this hospitalization.     Please call the Nurse if you have any side effects while in hospital.     Please call 911 if you have any life threatening symptoms after you are discharged from the hospital.    Please inform your Primary care physician for common side effects which are not life threatening after discharge.    Medication Name: Bumetanide(Bumex)   This Medication is used for:   Congestive Heart Failure   Excess Fluid  Common Side Effects are:   Dizziness/Low Blood Pressure(especially when standing up)    Frequent Urination   Low Potassium Levels   Muscle Cramps   Dehydration  A note from your nurse:  Call your nurse immediately if you notice itching, hives, swelling or trouble breathing             Thank you for your time.    Your Nurse    04/24/2018  12:17 PM  [facility]  [address]  [place], [place]  [address]

## 2018-04-24 NOTE — Plan of Care (Signed)
Problem: Everyday - Heart Failure  Goal: Stable Vital Signs and Fluid Balance  Outcome: Progressing   04/24/18 1127   Goal/Interventions addressed this shift   Stable Vital Signs and Fluid Balance Daily Standing Weights in the morning using the same scale, after using the bathroom and before breadfast. If unable to stand, zero the bed and use the bed scale;Monitor labs and report abnormalities to physician;Monitor, assess vital signs and telemetry per policy;Assess for swelling/edema;Strict Intake/Output;Fluid Restriction;Wean oxygen as needed if appropriate     Goal: Mobility/Activity is Maintained at Optimal Level for Patient  Outcome: Progressing   04/24/18 1127   Goal/Interventions addressed this shift   Mobility/Activity is Maintained at Optimal Level for Patient Increase mobility as tolerated/progressive mobility protocol;Maintain SCD's as Ordered;Reposition patient every 2 hours and as needed unless able to reposition self;Perform active/passive ROM;Assess for changes in respiratory status, level of consciousness and/or development of fatigue     Goal: Nutritional Intake is Adequate  Outcome: Progressing   04/24/18 1127   Goal/Interventions addressed this shift   Nutritional Intake is Adequate Cardiac diet-2 gm Sodium;Fluid Restricction if needed;Consult/Collaborate with Nutritionist;Assess appetite,anorexia and amount of meal/food tolerated;Patient and family teaching on low sodium diet;Encourage/perform oral hygiene as appropriate     Goal: Teaching-Using CHF Warning Zones and Educational Videos  Outcome: Progressing   04/24/18 1127   Goal/Interventions addressed this shift   Teaching-Using CHF Warning Zones and Educational Videos Signs & Symptoms of CHF;Daily Standing Weights & record;CHF Warning Zones and when to call for help;Medications;Sodium Restriction;Fluid Restriction if appropriate;Document in the Education Tab in EPIC with Teach-back

## 2018-06-29 ENCOUNTER — Emergency Department: Payer: No Typology Code available for payment source

## 2018-06-29 ENCOUNTER — Emergency Department
Admission: EM | Admit: 2018-06-29 | Discharge: 2018-06-29 | Disposition: A | Discharge status: Home / Self Care | Payer: No Typology Code available for payment source | Attending: Emergency Medicine | Admitting: Emergency Medicine

## 2018-06-29 DIAGNOSIS — J449 Chronic obstructive pulmonary disease, unspecified: Secondary | ICD-10-CM | POA: Insufficient documentation

## 2018-06-29 DIAGNOSIS — I10 Essential (primary) hypertension: Secondary | ICD-10-CM | POA: Insufficient documentation

## 2018-06-29 DIAGNOSIS — I872 Venous insufficiency (chronic) (peripheral): Secondary | ICD-10-CM | POA: Insufficient documentation

## 2018-06-29 DIAGNOSIS — E785 Hyperlipidemia, unspecified: Secondary | ICD-10-CM | POA: Insufficient documentation

## 2018-06-29 DIAGNOSIS — H5462 Unqualified visual loss, left eye, normal vision right eye: Secondary | ICD-10-CM | POA: Insufficient documentation

## 2018-06-29 DIAGNOSIS — S81812A Laceration without foreign body, left lower leg, initial encounter: Secondary | ICD-10-CM | POA: Insufficient documentation

## 2018-06-29 DIAGNOSIS — Z794 Long term (current) use of insulin: Secondary | ICD-10-CM | POA: Insufficient documentation

## 2018-06-29 DIAGNOSIS — Z79899 Other long term (current) drug therapy: Secondary | ICD-10-CM | POA: Insufficient documentation

## 2018-06-29 DIAGNOSIS — M353 Polymyalgia rheumatica: Secondary | ICD-10-CM | POA: Insufficient documentation

## 2018-06-29 DIAGNOSIS — W228XXA Striking against or struck by other objects, initial encounter: Secondary | ICD-10-CM | POA: Insufficient documentation

## 2018-06-29 DIAGNOSIS — S8012XA Contusion of left lower leg, initial encounter: Secondary | ICD-10-CM

## 2018-06-29 DIAGNOSIS — G4730 Sleep apnea, unspecified: Secondary | ICD-10-CM | POA: Insufficient documentation

## 2018-06-29 DIAGNOSIS — R32 Unspecified urinary incontinence: Secondary | ICD-10-CM | POA: Insufficient documentation

## 2018-06-29 DIAGNOSIS — E119 Type 2 diabetes mellitus without complications: Secondary | ICD-10-CM | POA: Insufficient documentation

## 2018-06-29 MED ORDER — LIDOCAINE-EPINEPHRINE 1 %-1:100000 IJ SOLN
1.00 mL | Freq: Once | INTRAMUSCULAR | Status: AC
Start: 2018-06-29 — End: 2018-06-29
  Administered 2018-06-29: 19:00:00 1 mL via INTRADERMAL
  Filled 2018-06-29: qty 20

## 2018-06-29 NOTE — EDIE (Signed)
COLLECTIVE?NOTIFICATION?06/29/2018 17:54?Rachael [name], Rachael [name]?MRN: [id number]    [name]:   VWU:[id number]  Account [id number]  Billing Account [id number]      Criteria Met      Narx Scores Alert    5 ED Visits in 12 Months    Security and Safety  No recent Security Events currently on file    ED Care Guidelines  There are currently no ED Care Guidelines for this patient. Please check your facility's medical records system.      Prescription Monitoring Program  683??- Narcotic Use Score  360??- Sedative Use Score  000??- Stimulant Use Score  480??- Overdose Risk Score  - All Scores range from 000-999 with 75% of the population scoring < 200 and on 1% scoring above 650  - The last digit of the narcotic, sedative, and stimulant score indicates the number of active prescriptions of that type  - Higher Use scores correlate with increased prescribers, pharmacies, mg equiv, and overlapping prescriptions  - Higher Overdose Risk Scores correlate with increased risk of unintentional overdose death   Concerning or unexpectedly high scores should prompt a review of the PMP record; this does not constitute checking PMP for prescribing purposes.      E.D. Visit Count (12 mo.)  Facility Visits   [facility] 5   Total 5   Note: Visits indicate total known visits.      Recent Emergency Department Visit Summary  Date Facility [facility] Type Diagnoses or Chief Complaint   Jun 29, 2018 [place] [name] [place] [facility]      leg laceration      Apr 20, 2018 [place] [name] [place] [facility]      chest pain      Shortness of Breath      Shortness of breath      Apr 04, 2018 [name] [place] [place] [facility]      Fluid In legs      Leg Swelling      Contusion of right lower leg, initial encounter      Lymphedema, not elsewhere classified      Mar 01, 2018 [place] [name] [place] [facility]      Fall      Iron deficiency anemia, unspecified       Contusion of other part of head, initial encounter      Elevated blood-pressure reading, without diagnosis of hypertension      Thrombocytopenia, unspecified      Anterior dislocation of left humerus, initial encounter      Hyperglycemia, unspecified      Unspecified fall, initial encounter      Jul 20, 2017 [place] [place].  Emergency      shortness of breath      Other forms of dyspnea          Recent Inpatient Visit Summary  Date Facility [facility] Type Diagnoses or Chief Complaint   Apr 20, 2018 [place] [place].  Cardiology      Shortness of breath          Care Team  There are no care providers on record at this time.   Collective Portal  This patient has registered at the [name] Emergency Department   For more information visit: [email or web address]     PLEASE NOTE:    1.   Any care recommendations and other clinical information are provided as guidelines or for historical purposes  only, and providers should exercise their own clinical judgment when providing care.    2.   You may only use this information for purposes of treatment, payment or health care operations activities, and subject to the limitations of applicable [employer] Policies.    3.   You should consult directly with the organization that provided a care guideline or other clinical history with any questions about additional information or accuracy or completeness of information provided.    ? 2019 [employer] - [email or web address].com/

## 2018-06-29 NOTE — ED Triage Notes (Signed)
pt presented to the ED after she slipped and fell and sustained a large skin tear to her L shin. pt has a palm sized skin tear on her shin. no active bleeding. pt states tetanus is up to date

## 2018-06-29 NOTE — Discharge Instructions (Signed)
Contusion    You have been diagnosed with a contusion.    A contusion is a bruise. A contusion occurs when something strikes or hits the body. This breaks small blood vessels called capillaries. When the capillaries break, blood leaks out. This makes the skin look red, purple, blue, or black. The injured area may hurt for a few days. If you take a blood thinner like warfarin (Coumadin) the bruising may be worse.    Apply ice to the bruise. Avoid using the injured body part.    Apply ice to help with pain and swelling. Put some ice cubes in a re-sealable plastic bag (like Ziploc). Add some water. Seal the bag. Put a thin washcloth between the bag and the skin. Apply the ice bag for at least 20 minutes. Do this at least 4 times per day. It's okay to apply ice longer or more often. NEVER APPLY ICE DIRECTLY TO THE SKIN. Always keep a washcloth between the ice pack and your body.    YOU SHOULD SEEK MEDICAL ATTENTION IMMEDIATELY, EITHER HERE OR AT THE NEAREST EMERGENCY DEPARTMENT, IF ANY OF THE FOLLOWING OCCURS:   Your pain or swelling gets much worse.   You develop new numbness or tingling in or below the affected area.   Your foot or hand looks cold or pale. This could mean there is a problem with circulation (blood supply).             Laceration, Sutures    You have been treated for a laceration (cut).    Follow up with your doctor OR come back here OR go to the nearest Emergency Department to have your sutures (stitches) taken out. Sutures should be taken out in:   10 days.    Use the following wound care instructions:   Keep the wound clean and dry for the next 24 hours. You can wash the wound gently with soap and water.   DO NOT allow your wound to soak in water (don't do the dishes or go swimming, for example). You can shower, but do not rub your stitches too hard. Let the wound dry before putting another bandage on.   Take off old dressings every day. Then put on a clean, dry dressing.   If the  bandage sticks to the wound, slightly moisten it with water. This way, it can come off more easily.   You can wash the wound gently with soap and water. To help remove a scab, cleanse the area with a mixture of half hydrogen peroxide and half water. This will also help [place] to take out the sutures later.   Allow the area to dry completely before putting on a new bandage.   Unless you receive instructions not to do so, you can place a thin layer of antibiotic ointment over the wound. You can buy Polysporin, Bacitracin, or Neosporin at the store. Neosporin can sometimes cause irritation to your skin. If this happens, stop using it and switch to another topical (surface) antibiotic.   If needed, put a clean, dry bandage over the wound to protect it.    Keep the affected area elevated (lifted) for the next 24 hours. This will decrease swelling and pain. You may also want to put ice on the area. By applying ice to the affected area, swelling and pain can be reduced. Place some ice cubes in a re-sealable (Ziploc) bag and add some water. Put a thin washcloth between the bag and the skin. Apply  the ice bag to the area for at least 20 minutes. Do this at least 4 times per day. It is OK to use the ice more frequently and for longer periods of time. DO NOT APPLY ICE DIRECTLY TO THE SKIN!    If you had a local anesthetic, it will wear off in about 2 hours. Until then, be careful not to hurt yourself because of having less feeling in the area.    YOU SHOULD SEEK MEDICAL ATTENTION IMMEDIATELY, EITHER HERE OR AT THE NEAREST EMERGENCY DEPARTMENT, IF ANY OF THE FOLLOWING OCCURS:   You see redness or swelling.   There are red streaks going up from the injured area.   The wound smells bad or has a lot of drainage.   You have fever (temperature higher than 100.60F / 38C), chills, worse pain and / or swelling.             Laceration, Steri-Strips    You have been seen today and treated for a laceration (cut). It was  closed with Steri-Strips instead of sutures or staples.    Leave the Steri-Strips in place until they come off on their own.    Use the following wound care instructions:   Keep the wound clean and dry for the next 24 hours and avoid excessive moisture.   DO NOT allow your wound to soak in water (don't do the dishes or go swimming, for example). You can shower, but be careful not to rub your Steri-Strips too hard (do not be too abrasive). Let the wound dry before putting on another bandage.   Take off old dressings every day and then put on a clean, dry dressing.   If the dressing sticks to the wound, moisten it with water. This lets it come off more easily.   To help take off a scab, cleanse the area with a half hydrogen peroxide/half water mix.   Let the area dry fully.   Unless you were told not to, you can put a thin layer of antibiotic ointment over the wound. You can buy Polysporin, Bacitracin, or Neosporin over the counter (without a prescription). Neosporin can sometimes irritate the skin. If this happens, stop using it and switch to another topical antibiotic.   Put a clean, dry bandage over the wound if necessary to protect the wound.    Keep the injured area elevated for the next 24 hours to lower swelling and pain. You may also want to put ice on the area. Put some ice cubes in a re-sealable (Ziploc) bag and add some water. Put a thin washcloth between the bag and the skin. Apply the ice bag to the area for at least 20 minutes. Do this at least 4 times per day. Longer times and more often are OK. NEVER APPLY ICE DIRECTLY TO THE SKIN.    If you had a local anesthetic, it will wear off in about 2 hours. Until then, be careful not to hurt yourself because of less feeling in the area.    YOU SHOULD SEEK MEDICAL ATTENTION IMMEDIATELY, EITHER HERE OR AT THE NEAREST EMERGENCY DEPARTMENT, IF ANY OF THE FOLLOWING OCCURS:   Unusual redness or swelling.   Red streaks going up the arm or  leg.   The wound smells bad or has a lot of drainage.   Fever (temperature higher than 100.60F / 38C), chills, increasing pain or swelling.

## 2018-07-02 NOTE — ED Provider Notes (Signed)
Physician/Midlevel provider first contact with patient: 06/29/18 1804         History     Chief Complaint   Patient presents with   . Laceration       Chief Complaint: Injury  Onset/Duration: PTA  Quality/Location: Left lower leg area  Severity: Moderate  Aggravating Factors: None  Alleviating Factors: None  Associated Symptoms/ Additional Comments: Tetanus up-to-date    PMH hypertension, COPD, arthritis, polymyalgia rheumatica, venous insufficiency, lymphedema, diabetes, GERD, hyperthyroidism, hyperlipidemia, diabetes, sleep apnea, hiatal hernia, low back pain, cataracts, anxiety, cholecystectomy, anemia, complaints of left leg injury PTA.  Reports accidentally slipping and catching leg against a piece of furniture, with laceration at same.  Denies other complaints or concerns or injuries.  Denies anticoagulant use.  Family history-noncontributory  Social history-denies tobacco/alcohol/drug          The history is provided by the patient, the EMS personnel and a relative (Daughter at bedside).            Past Medical History:   Diagnosis Date   . Anemia    . Anxiety    . Arthritis    . 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 Chronic cholecystitis with calculus 10/31/2015   . Chronic obstructive pulmonary disease    . Gastroesophageal reflux disease    . Hiatal hernia    . Hyperlipidemia    . Hypertension    . Hypothyroidism    . Incontinent of urine 2014    -WEARS DIAPERS-WILL BRING HER OWN   . Low back pain    . Lymphedema of right lower extremity    . PMR (polymyalgia rheumatica) 01/07/2017   . Polymyalgia rheumatica 09/2014   . Sleep apnea     USES CPAP NIGHTLY   . Type 2 diabetes mellitus, controlled    . Type II or unspecified type diabetes mellitus without mention of complication, not stated as uncontrolled    . Venous insufficiency of right leg 2016    also has lymphedema-KEEPS HER LEGS WRAPPED EXCEPT FOR WHEN SHE BATHES       Past Surgical History:   Procedure Laterality Date   . BACK  SURGERY  2014   . CARPAL TUNNEL RELEASE Bilateral 1978   . CLOSED REDUCTION SHOULDER DISLOCATION     . HYSTERECTOMY  1985   . RIGHT & LEFT HEART CATH  W/ CORONARY ANGIOS, LV/LA Bilateral 01/26/2018    Procedure: Right & Left Heart Cath  w/ Coronary Angios, LV/LA;  Surgeon: Elbert Ewings, MD;  Location: [facility];  Service: Cardiovascular;  Laterality: Bilateral;   . ROBOTIC, CHOLECYSTECTOMY N/A 10/31/2015    Procedure: ROBOT ASSISTED, LAPAROSCOPIC, CHOLECYSTECTOMY, UMBILICAL HERNIA REPAIR;  Surgeon: Alen Blew, MD;  Location: [facility];  Service: General;  Laterality: N/A;       History reviewed. No pertinent family history.    Social  Social History   Substance Use Topics   . Smoking status: Former Smoker     Packs/day: 1.00     Years: 25.00     Quit date: 11/16/1998   . Smokeless tobacco: Never Used   . Alcohol use 1.8 oz/week     3 Glasses of wine per week      Comment: social       .     Allergies   Allergen Reactions   . Tolectin [Tolmetin] Swelling   . Lyrica [Pregabalin]      tremors   .  Nsaids    . Latex Itching       Home Medications     Med List Status:  In Progress Set By: Ignacia Felling, RN at 06/29/2018  6:06 PM        Status Comment         06/29/2018  6:06 PM    List scanned into chart                albuterol-ipratropium (DUO-NEB) 2.5-0.5(3) mg/3 mL nebulizer     Take 3 mLs by nebulization     ALPRAZolam (XANAX) 0.25 MG tablet     Take 0.25 mg by mouth 3 (three) times daily as needed         bumetanide (BUMEX) 1 MG tablet     Take 2 tablets (2 mg total) by mouth 2 (two) times daily     dilTIAZem (TIAZAC) 300 MG 24 hr capsule     Take 300 mg by mouth daily     DULoxetine (CYMBALTA) 60 MG capsule     Take 60 mg by mouth daily     gabapentin (NEURONTIN) 300 MG capsule     Take 900 mg by mouth 2 (two) times daily.         guaiFENesin (MUCINEX) 600 MG 12 hr tablet     Take 1 tablet (600 mg total) by mouth every 12 (twelve) hours.     Patient taking differently:  Take 600 mg by  mouth 2 (two) times daily.         hydrALAZINE (APRESOLINE) 25 MG tablet     Take 1 tablet by mouth 2 (two) times daily.     HYDROcodone Bitartrate (ZOHYDRO ER) 10 MG Capsule Extended Release 12 hour Abuse-Deterrent     Take 10 mg by mouth every 12 (twelve) hours     HYDROcodone-acetaminophen (NORCO) 5-325 MG per tablet     Take 1 tablet by mouth every 4 (four) hours as needed for Pain.     Patient taking differently:  Take 1 tablet by mouth every 6 (six) hours.         insulin glargine (LANTUS) 100 UNIT/ML injection     Inject 18 Units into the skin nightly.     insulin glargine (LANTUS) 100 UNIT/ML injection     Inject 28 Units into the skin every morning         insulin lispro (HUMALOG) 100 UNIT/ML injection     Inject 1-8 Units into the skin 3 (three) times daily before meals as needed (High blood glucose. See administration instructions.).     Patient taking differently:  Inject 2-10 Units into the skin 4 (four) times daily.         lactobacillus acidophilus & bulgar (LACTINEX) chewable tablet     Chew 1 tablet by mouth 2 (two) times daily.         levothyroxine (SYNTHROID, LEVOTHROID) 50 MCG tablet     Take 50 mcg by mouth Once a day at 6:00am.     loperamide (IMODIUM) 2 MG capsule     Take 2 mg by mouth as needed for Diarrhea.     lubiprostone (AMITIZA) 24 MCG capsule     Take 24 mcg by mouth 2 (two) times daily with meals     metFORMIN (GLUCOPHAGE XR) 750 MG 24 hr tablet     Take 750 mg by mouth every morning with breakfast     metoprolol succinate (TOPROL-XL) 100 MG 24 hr tablet  Take 100 mg by mouth daily     Morphine Sulfate ER 15 MG Tablet Extended Release Abuse-Deterrent     Take by mouth     naloxone (NARCAN) 4 MG/0.1ML nasal spray     1 spray by Nasal route once as needed.For signs of opioid overdose.     Polyethyl Glycol-Propyl Glycol (SYSTANE ULTRA OP)     Apply 1 drop to eye 4 (four) times daily.         raNITIdine (ZANTAC) 150 MG tablet     Take 150 mg by mouth 2 (two) times daily.      SYMBICORT 160-4.5 MCG/ACT inhaler     Inhale 2 puffs into the lungs 2 (two) times daily.        traZODone (DESYREL) 100 MG tablet     Take 50 mg by mouth nightly.               Review of Systems   Constitutional: Negative for fatigue and fever.   HENT: Negative for congestion and sore throat.    Eyes: Negative for redness and visual disturbance.   Respiratory: Negative for cough and shortness of breath.    Cardiovascular: Negative for chest pain and palpitations.   Gastrointestinal: Negative for abdominal pain, diarrhea, nausea and vomiting.   Genitourinary: Negative for difficulty urinating and dysuria.   Musculoskeletal: Negative for back pain and neck pain.   Skin: Positive for wound. Negative for rash.   Neurological: Negative for dizziness, weakness, numbness and headaches.   Psychiatric/Behavioral: Negative for suicidal ideas. The patient is not nervous/anxious.        Physical Exam    BP: 139/71, Temp: 97.6 F (36.4 C), Resp Rate: 16, SpO2: 99 %, Weight: 83.2 kg    Physical Exam   Constitutional: She is oriented to person, place, and time. She appears well-developed. No distress.   HENT:   Head: Normocephalic and atraumatic.   Mouth/Throat: Oropharynx is clear and moist.   Eyes: Pupils are equal, round, and reactive to light. Conjunctivae and EOM are normal.   Neck: Normal range of motion. No spinous process tenderness and no muscular tenderness present.   Cardiovascular: Normal rate and regular rhythm.    Pulmonary/Chest: Effort normal and breath sounds normal. No respiratory distress.   Abdominal: Soft. She exhibits no mass. There is no tenderness.   Musculoskeletal: Normal range of motion. She exhibits no edema.        Right shoulder: She exhibits no deformity.        Legs:  Back: NT   Lymphadenopathy:     She has no cervical adenopathy.   Neurological: She is alert and oriented to person, place, and time. She has normal strength. No cranial nerve deficit or sensory deficit. Coordination normal. GCS eye  subscore is 4. GCS verbal subscore is 5. GCS motor subscore is 6.   Skin: Skin is warm. No rash noted.   Psychiatric: She has a normal mood and affect. Her speech is normal and behavior is normal. Cognition and memory are normal.         MDM and ED Course     ED Medication Orders     Start Ordered     Status Ordering Provider    06/29/18 1831 06/29/18 1830  lidocaine-EPINEPHrine (XYLOCAINE W/EPI) 1 %-1:100000 injection 1 mL  Once     Route: Intradermal  Ordered Dose: 1 mL     Last MAR action:  Given by Other Mars Scheaffer JEFFREY  MDM  Number of Diagnoses or Management Options  Contusion of left lower extremity, initial encounter: new and requires workup  Laceration of left lower extremity, initial encounter: new and requires workup     Amount and/or Complexity of Data Reviewed  Tests in the radiology section of CPT: ordered and reviewed  Obtain history from someone other than the patient: yes  Independent visualization of images, tracings, or specimens: yes    Risk of Complications, Morbidity, and/or Mortality  Presenting problems: moderate  Diagnostic procedures: moderate  Management options: moderate    Patient Progress  Patient progress: stable                   Lac Repair  Date/Time: 07/02/2018 6:32 AM  Performed by: Matilde Haymaker JEFFREY  Authorized by: Matilde Haymaker JEFFREY     Consent:     Consent obtained:  Verbal    Consent given by:  Patient    Risks discussed:  Infection, poor cosmetic result, need for additional repair and poor wound healing  Anesthesia (see MAR for exact dosages):     Anesthesia method:  Local infiltration    Local anesthetic:  Lidocaine 1% WITH epi  Laceration details:     Location:  Leg    Leg location:  L lower leg    Length (cm):  18  Repair type:     Repair type:  Intermediate  Exploration:     Wound exploration: wound explored through full range of motion and entire depth of wound probed and visualized      Wound extent: no fascia violation noted, no foreign  bodies/material noted, no muscle damage noted, no nerve damage noted, no tendon damage noted, no underlying fracture noted and no vascular damage noted    Treatment:     Area cleansed with:  Hibiclens and saline  Skin repair:     Repair method:  Sutures, tissue adhesive and Steri-Strips    Suture size:  4-0    Suture material:  Prolene    Suture technique:  Horizontal mattress    Number of sutures:  6  Approximation:     Approximation:  Close  Post-procedure details:     Dressing:  Antibiotic ointment and non-adherent dressing    Patient tolerance of procedure:  Tolerated well, no immediate complications  Comments:      Explained to patient/daughter that wound will not be able to be sutured in traditional fashion, given extremely thin and fragile and contused at risk tissue.  Attempted suturing with horizontal mattress, but even with large "bites" of tissue it is still tearing through the skin.  Did the best possible with horizontal mattresses and the balance repaired with Steri-Strips/tissue adhesive.  Encouraged prompt follow-up reexamination and return if concerns.        Clinical Impression & Disposition     Clinical Impression  Final diagnoses:   Laceration of left lower extremity, initial encounter   Contusion of left lower extremity, initial encounter        ED Disposition     ED Disposition Condition Date/Time Comment    Discharge  Wed Jun 29, 2018  7:41 PM Kathryne Hitch Fagin discharge to home/self care.    Condition at disposition: Stable           Discharge Medication List as of 06/29/2018  7:41 PM                    Herma Ard, MD  07/02/18 [phone]

## 2020-06-16 DEATH — deceased
